# Patient Record
Sex: Female | Born: 1945
Health system: Southern US, Community
[De-identification: ages and names within clinical notes are randomized; demographics above are authoritative.]

## PROBLEM LIST (undated history)

## (undated) DIAGNOSIS — G2581 Restless legs syndrome: Secondary | ICD-10-CM

## (undated) DIAGNOSIS — G5600 Carpal tunnel syndrome, unspecified upper limb: Secondary | ICD-10-CM

## (undated) DIAGNOSIS — I4510 Unspecified right bundle-branch block: Secondary | ICD-10-CM

## (undated) DIAGNOSIS — F419 Anxiety disorder, unspecified: Secondary | ICD-10-CM

## (undated) DIAGNOSIS — E782 Mixed hyperlipidemia: Secondary | ICD-10-CM

## (undated) DIAGNOSIS — K219 Gastro-esophageal reflux disease without esophagitis: Secondary | ICD-10-CM

## (undated) DIAGNOSIS — I480 Paroxysmal atrial fibrillation: Secondary | ICD-10-CM

## (undated) DIAGNOSIS — Z9889 Other specified postprocedural states: Secondary | ICD-10-CM

## (undated) DIAGNOSIS — I4891 Unspecified atrial fibrillation: Secondary | ICD-10-CM

## (undated) DIAGNOSIS — R0600 Dyspnea, unspecified: Secondary | ICD-10-CM

## (undated) DIAGNOSIS — H269 Unspecified cataract: Secondary | ICD-10-CM

## (undated) DIAGNOSIS — J189 Pneumonia, unspecified organism: Secondary | ICD-10-CM

## (undated) DIAGNOSIS — F329 Major depressive disorder, single episode, unspecified: Secondary | ICD-10-CM

## (undated) DIAGNOSIS — E119 Type 2 diabetes mellitus without complications: Secondary | ICD-10-CM

## (undated) DIAGNOSIS — F32A Depression, unspecified: Secondary | ICD-10-CM

## (undated) DIAGNOSIS — I10 Essential (primary) hypertension: Secondary | ICD-10-CM

## (undated) DIAGNOSIS — E1140 Type 2 diabetes mellitus with diabetic neuropathy, unspecified: Secondary | ICD-10-CM

## (undated) DIAGNOSIS — T7840XA Allergy, unspecified, initial encounter: Secondary | ICD-10-CM

## (undated) DIAGNOSIS — R112 Nausea with vomiting, unspecified: Secondary | ICD-10-CM

## (undated) DIAGNOSIS — J309 Allergic rhinitis, unspecified: Secondary | ICD-10-CM

## (undated) DIAGNOSIS — C55 Malignant neoplasm of uterus, part unspecified: Secondary | ICD-10-CM

## (undated) HISTORY — DX: Gastro-esophageal reflux disease without esophagitis: K21.9

## (undated) HISTORY — DX: Paroxysmal atrial fibrillation: I48.0

## (undated) HISTORY — DX: Essential (primary) hypertension: I10

## (undated) HISTORY — PX: SPINE SURGERY: SHX786

## (undated) HISTORY — DX: Malignant neoplasm of uterus, part unspecified: C55

## (undated) HISTORY — DX: Major depressive disorder, single episode, unspecified: F32.9

## (undated) HISTORY — DX: Allergic rhinitis, unspecified: J30.9

## (undated) HISTORY — PX: ABDOMINAL HYSTERECTOMY: SHX81

## (undated) HISTORY — PX: CHOLECYSTECTOMY: SHX55

## (undated) HISTORY — DX: Allergy, unspecified, initial encounter: T78.40XA

## (undated) HISTORY — DX: Depression, unspecified: F32.A

## (undated) HISTORY — DX: Unspecified cataract: H26.9

## (undated) HISTORY — PX: EYE SURGERY: SHX253

## (undated) HISTORY — DX: Carpal tunnel syndrome, unspecified upper limb: G56.00

## (undated) HISTORY — DX: Restless legs syndrome: G25.81

## (undated) HISTORY — DX: Type 2 diabetes mellitus without complications: E11.9

## (undated) HISTORY — DX: Unspecified right bundle-branch block: I45.10

## (undated) HISTORY — DX: Mixed hyperlipidemia: E78.2

## (undated) HISTORY — PX: BACK SURGERY: SHX140

## (undated) HISTORY — DX: Anxiety disorder, unspecified: F41.9

## (undated) HISTORY — DX: Type 2 diabetes mellitus with diabetic neuropathy, unspecified: E11.40

---

## 1998-05-14 ENCOUNTER — Ambulatory Visit (HOSPITAL_COMMUNITY): Admission: RE | Admit: 1998-05-14 | Discharge: 1998-05-14 | Payer: Self-pay | Admitting: Neurosurgery

## 1998-05-14 ENCOUNTER — Encounter: Payer: Self-pay | Admitting: Neurosurgery

## 1998-05-27 ENCOUNTER — Encounter: Payer: Self-pay | Admitting: Neurosurgery

## 1998-05-27 ENCOUNTER — Ambulatory Visit (HOSPITAL_COMMUNITY): Admission: RE | Admit: 1998-05-27 | Discharge: 1998-05-27 | Payer: Self-pay | Admitting: Neurosurgery

## 1998-06-10 ENCOUNTER — Encounter: Payer: Self-pay | Admitting: Neurosurgery

## 1998-06-10 ENCOUNTER — Ambulatory Visit (HOSPITAL_COMMUNITY): Admission: RE | Admit: 1998-06-10 | Discharge: 1998-06-10 | Payer: Self-pay | Admitting: Neurosurgery

## 1998-06-26 ENCOUNTER — Encounter: Payer: Self-pay | Admitting: Neurosurgery

## 1998-06-26 ENCOUNTER — Ambulatory Visit (HOSPITAL_COMMUNITY): Admission: RE | Admit: 1998-06-26 | Discharge: 1998-06-26 | Payer: Self-pay | Admitting: Neurosurgery

## 1998-10-05 ENCOUNTER — Encounter: Payer: Self-pay | Admitting: Neurosurgery

## 1998-10-07 ENCOUNTER — Inpatient Hospital Stay (HOSPITAL_COMMUNITY): Admission: RE | Admit: 1998-10-07 | Discharge: 1998-10-07 | Payer: Self-pay | Admitting: Neurosurgery

## 1998-10-07 ENCOUNTER — Encounter: Payer: Self-pay | Admitting: Neurosurgery

## 1998-12-08 ENCOUNTER — Encounter: Payer: Self-pay | Admitting: Neurosurgery

## 1998-12-08 ENCOUNTER — Ambulatory Visit (HOSPITAL_COMMUNITY): Admission: RE | Admit: 1998-12-08 | Discharge: 1998-12-08 | Payer: Self-pay | Admitting: Neurosurgery

## 1999-01-07 ENCOUNTER — Encounter: Payer: Self-pay | Admitting: Neurosurgery

## 1999-01-07 ENCOUNTER — Ambulatory Visit (HOSPITAL_COMMUNITY): Admission: RE | Admit: 1999-01-07 | Discharge: 1999-01-07 | Payer: Self-pay | Admitting: Neurosurgery

## 2001-11-27 ENCOUNTER — Encounter: Payer: Self-pay | Admitting: Family Medicine

## 2001-11-27 ENCOUNTER — Ambulatory Visit (HOSPITAL_COMMUNITY): Admission: RE | Admit: 2001-11-27 | Discharge: 2001-11-27 | Payer: Self-pay | Admitting: Family Medicine

## 2003-09-16 ENCOUNTER — Emergency Department (HOSPITAL_COMMUNITY): Admission: EM | Admit: 2003-09-16 | Discharge: 2003-09-16 | Payer: Self-pay | Admitting: Emergency Medicine

## 2005-03-09 ENCOUNTER — Emergency Department (HOSPITAL_COMMUNITY): Admission: EM | Admit: 2005-03-09 | Discharge: 2005-03-09 | Payer: Self-pay | Admitting: Emergency Medicine

## 2008-09-12 ENCOUNTER — Other Ambulatory Visit: Admission: RE | Admit: 2008-09-12 | Discharge: 2008-09-12 | Payer: Self-pay | Admitting: Orthopaedic Surgery

## 2010-06-15 ENCOUNTER — Encounter (HOSPITAL_COMMUNITY): Payer: Medicare Other

## 2010-06-15 ENCOUNTER — Other Ambulatory Visit: Payer: Self-pay | Admitting: Anesthesiology

## 2010-06-15 LAB — BASIC METABOLIC PANEL
BUN: 12 mg/dL (ref 6–23)
CO2: 26 mEq/L (ref 19–32)
Calcium: 10.6 mg/dL — ABNORMAL HIGH (ref 8.4–10.5)
Chloride: 101 mEq/L (ref 96–112)
Creatinine, Ser: 0.62 mg/dL (ref 0.4–1.2)
GFR calc Af Amer: >60 mL/min (ref >60)
GFR calc non Af Amer: >60 mL/min (ref >60)
Glucose, Bld: 135 mg/dL — ABNORMAL HIGH (ref 70–99)
Potassium: 4.1 mEq/L (ref 3.5–5.1)
Sodium: 140 mEq/L (ref 135–145)

## 2010-06-15 LAB — HEMOGLOBIN AND HEMATOCRIT, BLOOD
HCT: 38.8 % (ref 36.0–46.0)
Hemoglobin: 13.1 g/dL (ref 12.0–15.0)

## 2010-06-22 ENCOUNTER — Ambulatory Visit (HOSPITAL_COMMUNITY)
Admission: RE | Admit: 2010-06-22 | Discharge: 2010-06-22 | Disposition: A | Discharge status: Home / Self Care | Payer: Medicare Other | Source: Ambulatory Visit | Attending: Ophthalmology | Admitting: Ophthalmology

## 2010-06-22 DIAGNOSIS — I10 Essential (primary) hypertension: Secondary | ICD-10-CM | POA: Insufficient documentation

## 2010-06-22 DIAGNOSIS — Z794 Long term (current) use of insulin: Secondary | ICD-10-CM | POA: Insufficient documentation

## 2010-06-22 DIAGNOSIS — H2510 Age-related nuclear cataract, unspecified eye: Secondary | ICD-10-CM | POA: Insufficient documentation

## 2010-06-22 DIAGNOSIS — Z01812 Encounter for preprocedural laboratory examination: Secondary | ICD-10-CM | POA: Insufficient documentation

## 2010-06-22 DIAGNOSIS — Z79899 Other long term (current) drug therapy: Secondary | ICD-10-CM | POA: Insufficient documentation

## 2010-06-22 DIAGNOSIS — E119 Type 2 diabetes mellitus without complications: Secondary | ICD-10-CM | POA: Insufficient documentation

## 2010-06-22 LAB — GLUCOSE, CAPILLARY: Glucose-Capillary: 181 mg/dL — ABNORMAL HIGH (ref 70–99)

## 2010-07-02 ENCOUNTER — Other Ambulatory Visit (HOSPITAL_COMMUNITY): Payer: Medicare Other

## 2010-07-06 ENCOUNTER — Ambulatory Visit (HOSPITAL_COMMUNITY)
Admission: RE | Admit: 2010-07-06 | Discharge: 2010-07-06 | Disposition: A | Discharge status: Home / Self Care | Payer: Medicare Other | Source: Ambulatory Visit | Attending: Ophthalmology | Admitting: Ophthalmology

## 2010-07-06 DIAGNOSIS — Z01812 Encounter for preprocedural laboratory examination: Secondary | ICD-10-CM | POA: Insufficient documentation

## 2010-07-06 DIAGNOSIS — H2510 Age-related nuclear cataract, unspecified eye: Secondary | ICD-10-CM | POA: Insufficient documentation

## 2010-07-06 DIAGNOSIS — E119 Type 2 diabetes mellitus without complications: Secondary | ICD-10-CM | POA: Insufficient documentation

## 2010-07-06 DIAGNOSIS — Z794 Long term (current) use of insulin: Secondary | ICD-10-CM | POA: Insufficient documentation

## 2010-07-06 DIAGNOSIS — I10 Essential (primary) hypertension: Secondary | ICD-10-CM | POA: Insufficient documentation

## 2010-07-06 DIAGNOSIS — Z7982 Long term (current) use of aspirin: Secondary | ICD-10-CM | POA: Insufficient documentation

## 2011-06-22 ENCOUNTER — Telehealth: Payer: Self-pay

## 2011-06-22 NOTE — Telephone Encounter (Signed)
LMOM to call.

## 2011-07-12 ENCOUNTER — Telehealth: Payer: Self-pay

## 2011-07-12 NOTE — Telephone Encounter (Signed)
LMOM to call. See new phone note of 07/12/2011.

## 2011-07-12 NOTE — Telephone Encounter (Signed)
LMOM for a return call.  

## 2011-07-21 NOTE — Telephone Encounter (Signed)
LMOM to call. Will mail letter also.

## 2011-07-21 NOTE — Telephone Encounter (Signed)
Pt returned call. Said she does not want to have colonoscopy done at this time. Said she has a lot going on at this time and she will call when she is ready.

## 2014-09-16 LAB — HEMOGLOBIN A1C: Hgb A1c MFr Bld: 7 % — AB (ref 4.0–6.0)

## 2014-09-22 ENCOUNTER — Other Ambulatory Visit (HOSPITAL_COMMUNITY): Payer: Self-pay | Admitting: Family Medicine

## 2014-09-22 ENCOUNTER — Ambulatory Visit (HOSPITAL_COMMUNITY)
Admission: RE | Admit: 2014-09-22 | Discharge: 2014-09-22 | Disposition: A | Discharge status: Home / Self Care | Payer: Medicare Other | Source: Ambulatory Visit | Attending: Family Medicine | Admitting: Family Medicine

## 2014-09-22 DIAGNOSIS — X58XXXA Exposure to other specified factors, initial encounter: Secondary | ICD-10-CM | POA: Diagnosis not present

## 2014-09-22 DIAGNOSIS — M79671 Pain in right foot: Secondary | ICD-10-CM | POA: Diagnosis present

## 2014-09-22 DIAGNOSIS — R52 Pain, unspecified: Secondary | ICD-10-CM

## 2014-09-22 DIAGNOSIS — R937 Abnormal findings on diagnostic imaging of other parts of musculoskeletal system: Secondary | ICD-10-CM | POA: Diagnosis not present

## 2014-09-22 DIAGNOSIS — S99821A Other specified injuries of right foot, initial encounter: Secondary | ICD-10-CM | POA: Insufficient documentation

## 2014-10-28 ENCOUNTER — Telehealth: Payer: Self-pay

## 2014-10-28 NOTE — Telephone Encounter (Signed)
Pt states that her invokana is too expensive and wants to know if we can send in a new medication.

## 2014-10-31 NOTE — Telephone Encounter (Signed)
She can stay off of Invokana until next visit. No cheap alternative

## 2014-11-03 NOTE — Telephone Encounter (Signed)
Pt notified and agrees. 

## 2014-11-21 LAB — HEMOGLOBIN A1C: Hgb A1c MFr Bld: 6.5 % — AB (ref 4.0–6.0)

## 2014-12-24 ENCOUNTER — Ambulatory Visit (INDEPENDENT_AMBULATORY_CARE_PROVIDER_SITE_OTHER): Payer: Medicare Other | Admitting: "Endocrinology

## 2014-12-24 ENCOUNTER — Encounter: Payer: Self-pay | Admitting: "Endocrinology

## 2014-12-24 VITALS — BP 130/73 | HR 93 | Ht 63.0 in | Wt 172.0 lb

## 2014-12-24 DIAGNOSIS — E785 Hyperlipidemia, unspecified: Secondary | ICD-10-CM

## 2014-12-24 DIAGNOSIS — Z794 Long term (current) use of insulin: Secondary | ICD-10-CM

## 2014-12-24 DIAGNOSIS — IMO0001 Reserved for inherently not codable concepts without codable children: Secondary | ICD-10-CM

## 2014-12-24 DIAGNOSIS — E782 Mixed hyperlipidemia: Secondary | ICD-10-CM | POA: Insufficient documentation

## 2014-12-24 DIAGNOSIS — E1165 Type 2 diabetes mellitus with hyperglycemia: Secondary | ICD-10-CM | POA: Diagnosis not present

## 2014-12-24 DIAGNOSIS — I10 Essential (primary) hypertension: Secondary | ICD-10-CM | POA: Insufficient documentation

## 2014-12-24 NOTE — Patient Instructions (Signed)

## 2014-12-24 NOTE — Progress Notes (Signed)
Subjective:    Patient ID: Ellen Jordan, female    DOB: 02-15-1945, PCP Robert Bellow, MD   No past medical history on file. No past surgical history on file. Social History   Social History  . Marital Status: Married    Spouse Name: N/A  . Number of Children: N/A  . Years of Education: N/A   Social History Main Topics  . Smoking status: Former Research scientist (life sciences)  . Smokeless tobacco: Not on file  . Alcohol Use: No  . Drug Use: No  . Sexual Activity: Not on file   Other Topics Concern  . Not on file   Social History Narrative  . No narrative on file   Outpatient Encounter Prescriptions as of 12/24/2014  Medication Sig  . ALPRAZolam (XANAX) 0.5 MG tablet Take 0.5 mg by mouth at bedtime as needed for anxiety.  Marland Kitchen b complex vitamins tablet Take 1 tablet by mouth daily.  . Cinnamon 500 MG TABS Take by mouth daily.  . insulin aspart (NOVOLOG) 100 UNIT/ML injection Inject 15-21 Units into the skin 3 (three) times daily before meals.  . insulin detemir (LEVEMIR) 100 UNIT/ML injection Inject 60 Units into the skin at bedtime.  . Liraglutide (VICTOZA) 18 MG/3ML SOPN Inject 1.8 mg into the skin daily.  Marland Kitchen lisinopril (PRINIVIL,ZESTRIL) 10 MG tablet Take 10 mg by mouth daily.  . Omega-3 Fatty Acids (FISH OIL) 1000 MG CAPS Take by mouth daily.  . simvastatin (ZOCOR) 20 MG tablet Take 20 mg by mouth daily.  . Vitamin D, Cholecalciferol, 400 UNITS CHEW Chew by mouth daily.  . vitamin E (VITAMIN E) 400 UNIT capsule Take 400 Units by mouth daily.   No facility-administered encounter medications on file as of 12/24/2014.   ALLERGIES: Allergies  Allergen Reactions  . Penicillins    VACCINATION STATUS:  There is no immunization history on file for this patient.  Diabetes She presents for her follow-up diabetic visit. She has type 2 diabetes mellitus. Onset time: She was diagnosed at approximate age of 35 years. Her disease course has been improving. There are no hypoglycemic  associated symptoms. Pertinent negatives for hypoglycemia include no confusion, headaches, pallor or seizures. There are no diabetic associated symptoms. Pertinent negatives for diabetes include no chest pain, no polydipsia, no polyphagia and no polyuria. There are no hypoglycemic complications. Symptoms are improving. There are no diabetic complications. Risk factors for coronary artery disease include diabetes mellitus, dyslipidemia, hypertension, obesity, sedentary lifestyle and tobacco exposure. Current diabetic treatment includes intensive insulin program. She is compliant with treatment most of the time. Her weight is stable. She is following a generally unhealthy diet. She has not had a previous visit with a dietitian (She declined referral to CDE.). Her home blood glucose trend is decreasing steadily. Her breakfast blood glucose range is generally 140-180 mg/dl. Her lunch blood glucose range is generally 140-180 mg/dl. Her dinner blood glucose range is generally 140-180 mg/dl. Her overall blood glucose range is 140-180 mg/dl. An ACE inhibitor/angiotensin II receptor blocker is being taken. She does not see a podiatrist.Eye exam is not current (She is overdue for eye exam, I advised her to schedule a visit).  Hyperlipidemia This is a chronic problem. The current episode started more than 1 year ago. Exacerbating diseases include diabetes and obesity. Pertinent negatives include no chest pain, leg pain, myalgias or shortness of breath. Current antihyperlipidemic treatment includes statins. Risk factors for coronary artery disease include dyslipidemia, diabetes mellitus, hypertension, a sedentary lifestyle and obesity.  Hypertension This is a chronic problem. The current episode started more than 1 year ago. The problem is controlled. Pertinent negatives include no chest pain, headaches, palpitations or shortness of breath. Risk factors for coronary artery disease include diabetes mellitus, dyslipidemia,  obesity, sedentary lifestyle and smoking/tobacco exposure. Past treatments include ACE inhibitors. The current treatment provides moderate improvement.     Review of Systems  Constitutional: Negative for unexpected weight change.  HENT: Negative for trouble swallowing and voice change.   Eyes: Negative for visual disturbance.  Respiratory: Negative for cough, shortness of breath and wheezing.   Cardiovascular: Negative for chest pain, palpitations and leg swelling.  Gastrointestinal: Negative for nausea, vomiting and diarrhea.  Endocrine: Negative for cold intolerance, heat intolerance, polydipsia, polyphagia and polyuria.  Musculoskeletal: Negative for myalgias and arthralgias.  Skin: Negative for color change, pallor, rash and wound.  Neurological: Negative for seizures and headaches.  Psychiatric/Behavioral: Negative for suicidal ideas and confusion.    Objective:    BP 130/73 mmHg  Pulse 93  Ht _0  (1.6 m)  Wt 172 lb (78.019 kg)  BMI 30.48 kg/m2  SpO2 95%  Wt Readings from Last 3 Encounters:  12/24/14 172 lb (78.019 kg)    Physical Exam  Constitutional: She is oriented to person, place, and time. She appears well-developed.  HENT:  Head: Normocephalic and atraumatic.  Eyes: EOM are normal.  Neck: Normal range of motion. Neck supple. No tracheal deviation present. No thyromegaly present.  Cardiovascular: Normal rate and regular rhythm.   Pulmonary/Chest: Effort normal and breath sounds normal.  Abdominal: Soft. Bowel sounds are normal. There is no tenderness. There is no guarding.  Musculoskeletal: Normal range of motion. She exhibits no edema.  Neurological: She is alert and oriented to person, place, and time. She has normal reflexes. No cranial nerve deficit. Coordination normal.  Skin: Skin is warm and dry. No rash noted. No erythema. No pallor.  Psychiatric: She has a normal mood and affect. Judgment normal.    Results for orders placed or performed in visit on  12/24/14  Hemoglobin A1c  Result Value Ref Range   Hgb A1c MFr Bld 6.5 (A) 4.0 - 6.0 %  Hemoglobin A1c  Result Value Ref Range   Hgb A1c MFr Bld 7.0 (A) 4.0 - 6.0 %   Complete Blood Count (Most recent): Lab Results  Component Value Date   HGB 13.1 06/15/2010   HCT 38.8 06/15/2010   Chemistry (most recent): Lab Results  Component Value Date   NA 140 06/15/2010   K 4.1 06/15/2010   CL 101 06/15/2010   CO2 26 06/15/2010   BUN 12 06/15/2010   CREATININE 0.62 06/15/2010    Assessment & Plan:   1. Uncontrolled type 2 diabetes mellitus without complication, with long-term current use of insulin   - She remains at a high risk for more acute and chronic complications of diabetes which include CAD, CVA, CKD, retinopathy, and neuropathy. These are all discussed in detail with the patient.  Patient came with consistently near target glucose profile, and  recent A1c of 6.5% overall improving from 14.6 %.  Glucose logs and insulin administration records pertaining to this visit,  to be scanned into patient's records.  Recent labs reviewed.   - I have re-counseled the patient on diet management  by adopting a carbohydrate restricted / protein rich  Diet.  - Suggestion is made for patient to avoid simple carbohydrates   from their diet including Cakes , Desserts, Ice Cream,  Soda (  diet and regular) , Sweet Tea , Candies,  Chips, Cookies, Artificial Sweeteners,   and "Sugar-free" Products .  This will help patient to have stable blood glucose profile and potentially avoid unintended  Weight gain.  - Patient is advised to stick to a routine mealtimes to eat 3 meals  a day and avoid unnecessary snacks ( to snack only to correct hypoglycemia).  - The patient  declined a referral to a  CDE for individualized DM education.  - I have approached patient with the following individualized plan to manage diabetes and patient agrees.  - Continue Levemir 60 units qhs, and Novolog 15 units TIDAC ,  plus correction dose associated with monitoring of blood glucose before meals and at bedtime.  -Continue Victoza at 1.8 mg sq q day.  -I will discontinue  Invokana.  - Patient specific target  for A1c; LDL, HDL, Triglycerides, and  Waist Circumference were discussed in detail.  2) BP/HTN: controlled . Continue current medications including ACEI/ARB. 3) Lipids/HPL:  continue statins. 4)  Weight/Diet:  declined CDE consult, exercise, and carbohydrates information provided.  5) Chronic Care/Health Maintenance:  -Patient is on ACEI/ARB and Statin medications and encouraged to continue to follow up with Ophthalmology, Podiatrist at least yearly or according to recommendations, and advised to  stay away from smoking. I have recommended yearly flu vaccine and pneumonia vaccination at least every 5 years; moderate intensity exercise for up to 150 minutes weekly; and  sleep for at least 7 hours a day.  - 25 minutes of time was spent on the care of this patient , 50% of which was applied for counseling on diabetes complications and their preventions.  - I advised patient to maintain close follow up with Robert Bellow, MD for primary care needs.  Patient is asked to bring meter and  blood glucose logs during their next visit.   Follow up plan: -Return in about 3 months (around 03/24/2015) for diabetes, high blood pressure, high cholesterol, follow up with pre-visit labs, meter, and logs.  Glade Lloyd, MD Phone: [phone]  Fax: [phone]   12/24/2014, 11:41 AM

## 2015-02-16 ENCOUNTER — Other Ambulatory Visit: Payer: Self-pay

## 2015-02-18 ENCOUNTER — Other Ambulatory Visit: Payer: Self-pay

## 2015-02-28 ENCOUNTER — Encounter (HOSPITAL_COMMUNITY): Payer: Self-pay | Admitting: Emergency Medicine

## 2015-02-28 ENCOUNTER — Emergency Department (HOSPITAL_COMMUNITY): Payer: Commercial Managed Care - HMO

## 2015-02-28 ENCOUNTER — Inpatient Hospital Stay (HOSPITAL_COMMUNITY)
Admission: EM | Admit: 2015-02-28 | Discharge: 2015-03-12 | DRG: 871 | Disposition: A | Discharge status: Home / Self Care | Payer: Commercial Managed Care - HMO | Attending: Internal Medicine | Admitting: Internal Medicine

## 2015-02-28 DIAGNOSIS — R112 Nausea with vomiting, unspecified: Secondary | ICD-10-CM | POA: Diagnosis present

## 2015-02-28 DIAGNOSIS — K761 Chronic passive congestion of liver: Secondary | ICD-10-CM | POA: Diagnosis not present

## 2015-02-28 DIAGNOSIS — R0602 Shortness of breath: Secondary | ICD-10-CM | POA: Diagnosis not present

## 2015-02-28 DIAGNOSIS — I4891 Unspecified atrial fibrillation: Secondary | ICD-10-CM | POA: Diagnosis present

## 2015-02-28 DIAGNOSIS — I10 Essential (primary) hypertension: Secondary | ICD-10-CM | POA: Diagnosis not present

## 2015-02-28 DIAGNOSIS — E785 Hyperlipidemia, unspecified: Secondary | ICD-10-CM | POA: Diagnosis present

## 2015-02-28 DIAGNOSIS — E876 Hypokalemia: Secondary | ICD-10-CM | POA: Diagnosis not present

## 2015-02-28 DIAGNOSIS — J1100 Influenza due to unidentified influenza virus with unspecified type of pneumonia: Secondary | ICD-10-CM | POA: Diagnosis not present

## 2015-02-28 DIAGNOSIS — I361 Nonrheumatic tricuspid (valve) insufficiency: Secondary | ICD-10-CM | POA: Diagnosis not present

## 2015-02-28 DIAGNOSIS — I340 Nonrheumatic mitral (valve) insufficiency: Secondary | ICD-10-CM | POA: Diagnosis not present

## 2015-02-28 DIAGNOSIS — J9690 Respiratory failure, unspecified, unspecified whether with hypoxia or hypercapnia: Secondary | ICD-10-CM | POA: Diagnosis not present

## 2015-02-28 DIAGNOSIS — R269 Unspecified abnormalities of gait and mobility: Secondary | ICD-10-CM | POA: Diagnosis not present

## 2015-02-28 DIAGNOSIS — R6520 Severe sepsis without septic shock: Secondary | ICD-10-CM | POA: Diagnosis not present

## 2015-02-28 DIAGNOSIS — I481 Persistent atrial fibrillation: Secondary | ICD-10-CM | POA: Diagnosis present

## 2015-02-28 DIAGNOSIS — A419 Sepsis, unspecified organism: Principal | ICD-10-CM | POA: Diagnosis present

## 2015-02-28 DIAGNOSIS — E1165 Type 2 diabetes mellitus with hyperglycemia: Secondary | ICD-10-CM | POA: Diagnosis present

## 2015-02-28 DIAGNOSIS — J440 Chronic obstructive pulmonary disease with acute lower respiratory infection: Secondary | ICD-10-CM | POA: Diagnosis not present

## 2015-02-28 DIAGNOSIS — R0600 Dyspnea, unspecified: Secondary | ICD-10-CM | POA: Diagnosis not present

## 2015-02-28 DIAGNOSIS — J9601 Acute respiratory failure with hypoxia: Secondary | ICD-10-CM | POA: Diagnosis not present

## 2015-02-28 DIAGNOSIS — I351 Nonrheumatic aortic (valve) insufficiency: Secondary | ICD-10-CM | POA: Diagnosis not present

## 2015-02-28 DIAGNOSIS — I248 Other forms of acute ischemic heart disease: Secondary | ICD-10-CM | POA: Diagnosis present

## 2015-02-28 DIAGNOSIS — I5033 Acute on chronic diastolic (congestive) heart failure: Secondary | ICD-10-CM | POA: Diagnosis present

## 2015-02-28 DIAGNOSIS — R7989 Other specified abnormal findings of blood chemistry: Secondary | ICD-10-CM | POA: Diagnosis not present

## 2015-02-28 DIAGNOSIS — I110 Hypertensive heart disease with heart failure: Secondary | ICD-10-CM | POA: Diagnosis not present

## 2015-02-28 DIAGNOSIS — F419 Anxiety disorder, unspecified: Secondary | ICD-10-CM | POA: Diagnosis not present

## 2015-02-28 DIAGNOSIS — Z794 Long term (current) use of insulin: Secondary | ICD-10-CM | POA: Diagnosis not present

## 2015-02-28 DIAGNOSIS — F17200 Nicotine dependence, unspecified, uncomplicated: Secondary | ICD-10-CM | POA: Diagnosis not present

## 2015-02-28 DIAGNOSIS — J811 Chronic pulmonary edema: Secondary | ICD-10-CM

## 2015-02-28 DIAGNOSIS — E11649 Type 2 diabetes mellitus with hypoglycemia without coma: Secondary | ICD-10-CM | POA: Diagnosis present

## 2015-02-28 DIAGNOSIS — I5031 Acute diastolic (congestive) heart failure: Secondary | ICD-10-CM | POA: Diagnosis not present

## 2015-02-28 DIAGNOSIS — J189 Pneumonia, unspecified organism: Secondary | ICD-10-CM | POA: Diagnosis present

## 2015-02-28 DIAGNOSIS — E860 Dehydration: Secondary | ICD-10-CM | POA: Diagnosis present

## 2015-02-28 DIAGNOSIS — J1000 Influenza due to other identified influenza virus with unspecified type of pneumonia: Secondary | ICD-10-CM | POA: Diagnosis present

## 2015-02-28 DIAGNOSIS — E872 Acidosis: Secondary | ICD-10-CM | POA: Diagnosis not present

## 2015-02-28 DIAGNOSIS — J80 Acute respiratory distress syndrome: Secondary | ICD-10-CM | POA: Diagnosis not present

## 2015-02-28 DIAGNOSIS — Z87891 Personal history of nicotine dependence: Secondary | ICD-10-CM | POA: Diagnosis not present

## 2015-02-28 DIAGNOSIS — R05 Cough: Secondary | ICD-10-CM | POA: Diagnosis not present

## 2015-02-28 DIAGNOSIS — IMO0001 Reserved for inherently not codable concepts without codable children: Secondary | ICD-10-CM

## 2015-02-28 LAB — COMPREHENSIVE METABOLIC PANEL
ALT: 32 U/L (ref 14–54)
ANION GAP: 14 (ref 5–15)
AST: 39 U/L (ref 15–41)
Albumin: 3.7 g/dL (ref 3.5–5.0)
Alkaline Phosphatase: 56 U/L (ref 38–126)
BUN: 21 mg/dL — ABNORMAL HIGH (ref 6–20)
CHLORIDE: 97 mmol/L — AB (ref 101–111)
CO2: 19 mmol/L — AB (ref 22–32)
CREATININE: 0.93 mg/dL (ref 0.44–1.00)
Calcium: 8.9 mg/dL (ref 8.9–10.3)
GFR calc non Af Amer: >60 mL/min (ref >60)
Glucose, Bld: 200 mg/dL — ABNORMAL HIGH (ref 65–99)
POTASSIUM: 3.5 mmol/L (ref 3.5–5.1)
SODIUM: 130 mmol/L — AB (ref 135–145)
Total Bilirubin: 1.3 mg/dL — ABNORMAL HIGH (ref 0.3–1.2)
Total Protein: 7.7 g/dL (ref 6.5–8.1)

## 2015-02-28 LAB — INFLUENZA PANEL BY PCR (TYPE A & B)
H1N1 flu by pcr: NOT DETECTED
INFLAPCR: NEGATIVE
Influenza B By PCR: POSITIVE — AB

## 2015-02-28 LAB — I-STAT CG4 LACTIC ACID, ED
LACTIC ACID, VENOUS: 2.7 mmol/L — AB (ref 0.5–2.0)
Lactic Acid, Venous: 2.93 mmol/L (ref 0.5–2.0)

## 2015-02-28 LAB — I-STAT CHEM 8, ED
BUN: 20 mg/dL (ref 6–20)
CHLORIDE: 98 mmol/L — AB (ref 101–111)
Calcium, Ion: 1.07 mmol/L — ABNORMAL LOW (ref 1.13–1.30)
Creatinine, Ser: 0.9 mg/dL (ref 0.44–1.00)
Glucose, Bld: 198 mg/dL — ABNORMAL HIGH (ref 65–99)
HEMATOCRIT: 41 % (ref 36.0–46.0)
Hemoglobin: 13.9 g/dL (ref 12.0–15.0)
POTASSIUM: 3.6 mmol/L (ref 3.5–5.1)
SODIUM: 132 mmol/L — AB (ref 135–145)
TCO2: 20 mmol/L (ref 0–100)

## 2015-02-28 LAB — CBC
HCT: 38.2 % (ref 36.0–46.0)
Hemoglobin: 13.1 g/dL (ref 12.0–15.0)
MCH: 29.2 pg (ref 26.0–34.0)
MCHC: 34.3 g/dL (ref 30.0–36.0)
MCV: 85.3 fL (ref 78.0–100.0)
PLATELETS: 153 10*3/uL (ref 150–400)
RBC: 4.48 MIL/uL (ref 3.87–5.11)
RDW: 13.7 % (ref 11.5–15.5)
WBC: 12.7 10*3/uL — ABNORMAL HIGH (ref 4.0–10.5)

## 2015-02-28 LAB — TROPONIN I: TROPONIN I: 0.04 ng/mL — AB (ref <0.031)

## 2015-02-28 LAB — BRAIN NATRIURETIC PEPTIDE: B Natriuretic Peptide: 375 pg/mL — ABNORMAL HIGH (ref 0.0–100.0)

## 2015-02-28 MED ORDER — ACETAMINOPHEN 500 MG PO TABS
1000.0000 mg | ORAL_TABLET | Freq: Once | ORAL | Status: AC
  Administered 2015-02-28: 1000 mg via ORAL
  Filled 2015-02-28: qty 2

## 2015-02-28 MED ORDER — LEVOFLOXACIN IN D5W 750 MG/150ML IV SOLN
750.0000 mg | Freq: Once | INTRAVENOUS | Status: DC
  Administered 2015-02-28: 750 mg via INTRAVENOUS

## 2015-02-28 MED ORDER — GUAIFENESIN-DM 100-10 MG/5ML PO SYRP
5.0000 mL | ORAL_SOLUTION | ORAL | Status: DC | PRN
  Administered 2015-02-28 – 2015-03-03 (×6): 5 mL via ORAL
  Filled 2015-02-28 (×6): qty 5

## 2015-02-28 MED ORDER — SODIUM CHLORIDE 0.9 % IV BOLUS (SEPSIS)
1000.0000 mL | Freq: Once | INTRAVENOUS | Status: AC
  Administered 2015-02-28: 1000 mL via INTRAVENOUS

## 2015-02-28 MED ORDER — OSELTAMIVIR PHOSPHATE 30 MG PO CAPS
30.0000 mg | ORAL_CAPSULE | Freq: Two times a day (BID) | ORAL | Status: AC
  Administered 2015-03-01 – 2015-03-05 (×10): 30 mg via ORAL
  Filled 2015-02-28 (×11): qty 1

## 2015-02-28 MED ORDER — DILTIAZEM HCL 25 MG/5ML IV SOLN
20.0000 mg | Freq: Once | INTRAVENOUS | Status: AC
  Administered 2015-02-28: 20 mg via INTRAVENOUS

## 2015-02-28 MED ORDER — IPRATROPIUM BROMIDE 0.02 % IN SOLN
0.5000 mg | Freq: Once | RESPIRATORY_TRACT | Status: AC
  Administered 2015-02-28: 0.5 mg via RESPIRATORY_TRACT
  Filled 2015-02-28: qty 2.5

## 2015-02-28 MED ORDER — VANCOMYCIN HCL IN DEXTROSE 1-5 GM/200ML-% IV SOLN: 1000.0000 mg | Freq: Once | INTRAVENOUS | Status: DC

## 2015-02-28 MED ORDER — DEXTROSE 5 % IV SOLN: 5.0000 mg/h | Freq: Once | INTRAVENOUS | Status: DC

## 2015-02-28 MED ORDER — SODIUM CHLORIDE 0.9 % IV BOLUS (SEPSIS)
500.0000 mL | Freq: Once | INTRAVENOUS | Status: AC
  Administered 2015-02-28: 500 mL via INTRAVENOUS

## 2015-02-28 MED ORDER — LEVOFLOXACIN IN D5W 750 MG/150ML IV SOLN
750.0000 mg | INTRAVENOUS | Status: DC
  Filled 2015-02-28: qty 150

## 2015-02-28 MED ORDER — DILTIAZEM LOAD VIA INFUSION
20.0000 mg | Freq: Once | INTRAVENOUS | Status: AC | PRN
  Administered 2015-02-28: 20 mg via INTRAVENOUS
  Filled 2015-02-28: qty 20

## 2015-02-28 MED ORDER — DEXTROSE 5 % IV SOLN
2.0000 g | Freq: Once | INTRAVENOUS | Status: AC
  Administered 2015-02-28: 2 g via INTRAVENOUS
  Filled 2015-02-28: qty 2

## 2015-02-28 MED ORDER — VANCOMYCIN HCL IN DEXTROSE 750-5 MG/150ML-% IV SOLN
750.0000 mg | Freq: Two times a day (BID) | INTRAVENOUS | Status: DC
  Filled 2015-02-28 (×4): qty 150

## 2015-02-28 MED ORDER — SODIUM CHLORIDE 0.9 % IV SOLN
INTRAVENOUS | Status: DC
  Administered 2015-02-28: via INTRAVENOUS

## 2015-02-28 MED ORDER — SODIUM CHLORIDE 0.9 % IV BOLUS (SEPSIS)
500.0000 mL | Freq: Once | INTRAVENOUS | Status: AC
Start: 2015-02-28 — End: 2015-02-28
  Administered 2015-02-28: 500 mL via INTRAVENOUS

## 2015-02-28 MED ORDER — DILTIAZEM LOAD VIA INFUSION
10.0000 mg | Freq: Once | INTRAVENOUS | Status: AC
  Administered 2015-02-28: 10 mg via INTRAVENOUS
  Filled 2015-02-28: qty 10

## 2015-02-28 MED ORDER — SODIUM CHLORIDE 0.9 % IV SOLN
INTRAVENOUS | Status: DC
  Administered 2015-02-28: 18:00:00 via INTRAVENOUS

## 2015-02-28 MED ORDER — ALBUTEROL SULFATE (2.5 MG/3ML) 0.083% IN NEBU
5.0000 mg | INHALATION_SOLUTION | Freq: Once | RESPIRATORY_TRACT | Status: AC
  Administered 2015-02-28: 5 mg via RESPIRATORY_TRACT
  Filled 2015-02-28: qty 6

## 2015-02-28 MED ORDER — VANCOMYCIN HCL 10 G IV SOLR
1500.0000 mg | Freq: Once | INTRAVENOUS | Status: AC
  Administered 2015-02-28: 1500 mg via INTRAVENOUS
  Filled 2015-02-28: qty 1500

## 2015-02-28 MED ORDER — DILTIAZEM HCL 100 MG IV SOLR
5.0000 mg/h | INTRAVENOUS | Status: DC
  Administered 2015-02-28: 12.5 mg/h via INTRAVENOUS
  Administered 2015-02-28: 5 mg/h via INTRAVENOUS
  Administered 2015-03-01: 15 mg/h via INTRAVENOUS
  Filled 2015-02-28 (×3): qty 100

## 2015-02-28 MED ORDER — DEXTROSE 5 % IV SOLN
1.0000 g | Freq: Three times a day (TID) | INTRAVENOUS | Status: DC
  Administered 2015-03-01 (×2): 1 g via INTRAVENOUS
  Filled 2015-02-28 (×7): qty 1

## 2015-02-28 NOTE — ED Notes (Addendum)
Patient c/o abd pain with  nausea, vomiting, and diarrhea. Unsure of any fevers. Denies any urinary syptoms. Per patient believes she may have pneumonia as well. Patient c/o cough and body aches x1 week with lack of appetite. Per daughter patient has not had medications in 3 days. Patient heart rate 140s-150s

## 2015-02-28 NOTE — Progress Notes (Signed)
ANTIBIOTIC CONSULT NOTE - INITIAL  Pharmacy Consult for Vancomycin/Levaqin/Azactam Indication: sepsis  Allergies  Allergen Reactions  . Penicillins     Patient Measurements: Height: _0  (160 cm) Weight: 177 lb (80.287 kg) IBW/kg (Calculated) : 52.4 Adjusted Body Weight:   Vital Signs: Temp: 100.7 F (38.2 C) (02/18 1408) Temp Source: Oral (02/18 1408) BP: 125/66 mmHg (02/18 1347) Pulse Rate: 145 (02/18 1347) Intake/Output from previous day:   Intake/Output from this shift:    Labs:  Recent Labs  02/28/15 1418 02/28/15 1421  WBC 12.7*  --   HGB 13.1 13.9  PLT 153  --   CREATININE  --  0.90   Estimated Creatinine Clearance: 57.6 mL/min (by C-G formula based on Cr of 0.9). No results for input(s): VANCOTROUGH, VANCOPEAK, VANCORANDOM, GENTTROUGH, GENTPEAK, GENTRANDOM, TOBRATROUGH, TOBRAPEAK, TOBRARND, AMIKACINPEAK, AMIKACINTROU, AMIKACIN in the last 72 hours.   Microbiology: No results found for this or any previous visit (from the past 720 hour(s)).  Medical History: Past Medical History  Diagnosis Date  . Hypertension   . Diabetes mellitus without complication ([place])   . Hypercholesteremia   . Cancer (HCC)     Medications:  Scheduled:  . diltiazem  20 mg Intravenous Once  . [START ON 03/01/2015] vancomycin  750 mg Intravenous Q12H   Assessment: 70 yo female ED patient, possibly pneumonia. Nausea, vomiting. CrCl 57.6 ml/min  Goal of Therapy:  Vancomycin trough level 15-20 mcg/ml  Plan:  Vancomycin 1500 mg IV  loading dose, then 750 mg IV every 12 hours Levaquin 750 mg IV every 24 hours Azactam 2 GM IV loading dose, then 1 GM IV every 8 hours Vancomycin trough at steady state Labs per protocol  Chriss Czar 02/28/2015,3:04 PM

## 2015-02-28 NOTE — Progress Notes (Signed)
Interim Progress Note  Called to assess patient accepted in transfer from [facility].  70 yo F w/ DM and HTN presenting with influenza B pneumonia and sepsis.  On broad spectrum antibiotics.  Cultures obtained, and 30 cc/kg bolus administered.  Vitals     Blood pressure 114/84, pulse 92, temperature 99.1 F (37.3 C), temperature source Oral, resp. rate 27, height _0  (1.6 m), weight 77.111 kg (170 lb), SpO2 97 %. Heart:     Irregular rate and rhythm and Tachycardic Lungs:    Bilateral diminished inspiratory effort, coarse airway sounds with exhalation.   Capillary Refill:   <2 sec Peripheral Pulse:   Radial pulse palpable Skin:     Pale  Started oseltamivir.  Continue MIVF and appropriate cares for sepsis.  On diltiazem for Afib, likely will not improve until sepsis adequately treated.

## 2015-02-28 NOTE — ED Notes (Signed)
Lab called with positive influenza B swab, RN contacted pt's primary RN at [facility] and reported the results.

## 2015-02-28 NOTE — H&P (Addendum)
History and Physical  Ellen Jordan LPF:[id number] DOB: 1945-06-29 DOA: 02/28/2015  Referring physician: Dr Ashok Cordia, ED physician PCP: Robert Bellow, MD   Chief Complaint: Shortness of breath, Cough  HPI: Ellen Jordan is a 70 y.o. female  With a history of diabetes, hypertension, hypercholesterolemia. She breath sounds with 3 weeks of worsening dyspnea is worse with exertion and improves with rest. She has a productive cough over the past 3-4 days with fevers. Additionally she has noticed that her heart rate has been fast over the past 2 days. She denies chest pain, although has some pleurisy with coughing.   Review of Systems:   Pt complains of dementia appetite.  Pt denies any fevers, chills, nausea, vomiting, diarrhea, constipation, abdominal pain, orthopnea, wheezing, palpitations, headache, vision changes, lightheadedness, dizziness, diarrhea, constipation, melena, rectal bleeding.  Review of systems are otherwise negative  Past Medical History  Diagnosis Date  . Hypertension   . Diabetes mellitus without complication ([place])   . Hypercholesteremia   . Cancer [facility])    Past Surgical History  Procedure Laterality Date  . Abdominal hysterectomy    . Back surgery    . Cholecystectomy     Social History:  reports that she quit smoking about 10 years ago. Her smoking use included Cigarettes. She quit after 40 years of use. She has never used smokeless tobacco. She reports that she does not drink alcohol or use illicit drugs. Patient lives at home & is able to participate in activities of daily living  Allergies  Allergen Reactions  . Penicillins Hives    Has patient had a PCN reaction causing immediate rash, facial/tongue/throat swelling, SOB or lightheadedness with hypotension: Yes Has patient had a PCN reaction causing severe rash involving mucus membranes or skin necrosis: No Has patient had a PCN reaction that required hospitalization No Has patient had a PCN reaction  occurring within the last 10 years: No If all of the above answers are "NO", then may proceed with Cephalosporin use.     Family History  Problem Relation Age of Onset  . Cancer Mother   . Diabetes Brother      Prior to Admission medications   Medication Sig Start Date End Date Taking? Authorizing Provider  ALPRAZolam [name]) 0.5 MG tablet Take 0.5 mg by mouth 3 (three) times daily.    Yes Historical Provider, MD  Ascorbic Acid (VITAMIN C GUMMIE PO) Take 2 each by mouth daily.   Yes Historical Provider, MD  aspirin EC 81 MG tablet Take 81 mg by mouth daily.   Yes Historical Provider, MD  atenolol (TENORMIN) 50 MG tablet Take 50 mg by mouth daily.   Yes Historical Provider, MD  b complex vitamins tablet Take 1 tablet by mouth daily.   Yes Historical Provider, MD  Biotin 5000 MCG CAPS Take 1 capsule by mouth daily.   Yes Historical Provider, MD  Calcium-Phosphorus-Vitamin D (CALCIUM GUMMIES PO) Take 500-1,000 mg by mouth daily.   Yes Historical Provider, MD  Cholecalciferol (VITAMIN D) 2000 units CAPS Take 1 capsule by mouth daily.   Yes Historical Provider, MD  Cinnamon 500 MG TABS Take 1,000 mg by mouth daily.    Yes Historical Provider, MD  diphenhydrAMINE (BENADRYL) 25 MG tablet Take 25 mg by mouth daily.   Yes Historical Provider, MD  Garcinia Cambogia-Chromium 500-200 MG-MCG TABS Take 1 tablet by mouth daily.   Yes Historical Provider, MD  insulin aspart (NOVOLOG) 100 UNIT/ML injection Inject 15 Units into the skin  3 (three) times daily before meals. Sliding scale included as directed per MD   Yes Historical Provider, MD  insulin detemir (LEVEMIR) 100 UNIT/ML injection Inject 60 Units into the skin at bedtime.   Yes Historical Provider, MD  INVOKANA 100 MG TABS tablet Take 100 mg by mouth daily. 02/26/15  Yes Historical Provider, MD  Liraglutide (VICTOZA) 18 MG/3ML SOPN Inject 1.2 mg into the skin daily.    Yes Historical Provider, MD  lisinopril (PRINIVIL,ZESTRIL) 20 MG tablet Take 20  mg by mouth daily.   Yes Historical Provider, MD  Misc Natural Products (ESTROVEN + ENERGY MAX STRENGTH) TABS Take 1 tablet by mouth daily.   Yes Historical Provider, MD  Omega-3 Fatty Acids (FISH OIL) 1200 MG CAPS Take 1 capsule by mouth daily.   Yes Historical Provider, MD  sertraline (ZOLOFT) 100 MG tablet Take 100 mg by mouth daily.   Yes Historical Provider, MD  simvastatin (ZOCOR) 40 MG tablet Take 40 mg by mouth daily.   Yes Historical Provider, MD  vitamin E (VITAMIN E) 400 UNIT capsule Take 400 Units by mouth daily.   Yes Historical Provider, MD    Physical Exam: BP 128/75 mmHg  Pulse 109  Temp(Src) 100.7 F (38.2 C) (Oral)  Resp 20  Ht _0  (1.6 m)  Wt 80.287 kg (177 lb)  BMI 31.36 kg/m2  SpO2 96%  General: Elderly Caucasian female. Awake and alert and oriented x3. No acute cardiopulmonary distress.  Eyes: Pupils equal, round, reactive to light. Extraocular muscles are intact. Sclerae anicteric and noninjected.  ENT:  Dry mucosal membranes. No mucosal lesions.   Neck: Neck supple without lymphadenopathy. No carotid bruits. No masses palpated.  Cardiovascular: Tachycardic with irregularly irregular rate. No murmurs, rubs, gallops auscultated. No JVD.  Respiratory: Rales in the left midlung field. Slightly diminished breath sounds. No wheezes or rhonchi.  Abdomen: Soft, nontender, nondistended. Active bowel sounds. No masses or hepatosplenomegaly  Skin: Dry, warm to touch. 2+ dorsalis pedis and radial pulses. Musculoskeletal: No calf or leg pain. All major joints not erythematous nontender.  Psychiatric: Intact judgment and insight.  Neurologic: No focal neurological deficits. Cranial nerves II through XII are grossly intact.           Labs on Admission:  Basic Metabolic Panel:  Recent Labs Lab 02/28/15 1418 02/28/15 1421  NA 130* 132*  K 3.5 3.6  CL 97* 98*  CO2 19*  --   GLUCOSE 200* 198*  BUN 21* 20  CREATININE 0.93 0.90  CALCIUM 8.9  --    Liver Function  Tests:  Recent Labs Lab 02/28/15 1418  AST 39  ALT 32  ALKPHOS 56  BILITOT 1.3*  PROT 7.7  ALBUMIN 3.7   No results for input(s): LIPASE, AMYLASE in the last 168 hours. No results for input(s): AMMONIA in the last 168 hours. CBC:  Recent Labs Lab 02/28/15 1418 02/28/15 1421  WBC 12.7*  --   HGB 13.1 13.9  HCT 38.2 41.0  MCV 85.3  --   PLT 153  --    Cardiac Enzymes:  Recent Labs Lab 02/28/15 1418  TROPONINI 0.04*    BNP (last 3 results)  Recent Labs  02/28/15 1418  BNP 375.0*    ProBNP (last 3 results) No results for input(s): PROBNP in the last 8760 hours.  CBG: No results for input(s): GLUCAP in the last 168 hours.  Radiological Exams on Admission: Dg Chest Port 1 View  02/28/2015  CLINICAL DATA:  Cough, shortness  of breath and myalgias. EXAM: PORTABLE CHEST 1 VIEW COMPARISON:  None. FINDINGS: The patient is rotated to the left, making it difficult to assess the heart size. Probable borderline enlargement of the heart. Dense patchy opacity in the left mid lung zone. Patchy opacity at the medial right lung base. Unremarkable bones. IMPRESSION: 1. Pneumonia in the left mid lung zone. Followup PA and lateral chest X-ray is recommended in 3-4 weeks following trial of antibiotic therapy to ensure resolution and exclude underlying malignancy. 2. Right basilar pneumonia or atelectasis. Electronically Signed   By: Claudie Revering M.D.   On: 02/28/2015 15:04    EKG: Independently reviewed. Tachycardia with irregularly irregular rate. Right bundle branch block with left posterior fascicular block. No acute ST elevation or depression.  Assessment/Plan Present on Admission:  . Atrial fibrillation with rapid ventricular response ([place]) . Elevated troponin I level . Community acquired pneumonia . Acute respiratory failure with hypoxia ([place]) . Lactic acidosis . Sepsis [facility])  This patient was discussed with the ED physician, including pertinent vitals, physical exam  findings, labs, and imaging.  We also discussed care given by the ED provider.  #1 Sepsis #2 atrial fibrillation with rapid ventricular response  Admit to stepdown at [place] drip - will rebolus with 20 mg of Cardizem  Troponins every 3 hours 3  TSH  If patient continues to not respond to Cardizem, may need to consult cardiology for assistance   CHADS-VASC score of 4 #3 acute respiratory failure with hypoxia  Oxygen via nasal cannula  #4 community acquired pneumonia  Continue Levaquin and vancomycin  Sputum cultures  Strep and Legionella antigen by urine  Repeat CBC in the morning  Flu swab pending #5 lactic acidosis  Into the IV fluids and recheck lactic acid and 3 hours  #6 elevated troponin I level  Trend troponins  #7 diabetes  CBG before meals and daily at bedtime  Continue home to glycemic   Sliding scale insulin to cover  #8 hypertension  Continue home antihypertensives  DVT prophylaxis: Full dose Lovenox   Consultants:   Code Status: Full code   Family Communication: Husband and daughter in room   Disposition Plan: Admission to stepdown at [place], [place] [facility] Pager [phone]

## 2015-02-28 NOTE — ED Notes (Signed)
Called [facility] with bed assignment at [facility].

## 2015-02-28 NOTE — ED Provider Notes (Signed)
CSN: [id number]     Arrival date & time 02/28/15  1247 History   First MD Initiated Contact with Patient 02/28/15 1357     Chief Complaint  Patient presents with  . Emesis     (Consider location/radiation/quality/duration/timing/severity/associated sxs/prior Treatment) Patient is a 70 y.o. female presenting with vomiting. The history is provided by the patient and a relative.  Emesis Associated symptoms: diarrhea   Associated symptoms: no abdominal pain, no headaches and no sore throat   Patient presents w very poor po intake for the past few days.  Nausea. Had episode vomiting, not bloody or bilious.  Has has a few diarrheal stools, watery.  No bloody diarrhea. No abd pain.  Patient also c/o diffuse body aches, and prod cough in the past week.  Pt indicates not able to take meds for the past few days.  +sob.  Denies chest pain. Symptoms acute onset, persistent/progressive, mod-severe without specific exacerbating or alleviating factors.  Denies known ill contacts.      Past Medical History  Diagnosis Date  . Hypertension   . Diabetes mellitus without complication ([place])   . Hypercholesteremia   . Cancer [facility])    Past Surgical History  Procedure Laterality Date  . Abdominal hysterectomy    . Back surgery    . Cholecystectomy     Family History  Problem Relation Age of Onset  . Cancer Mother   . Diabetes Brother    Social History  Substance Use Topics  . Smoking status: Former Smoker -- 40 years    Types: Cigarettes    Quit date: 01/10/2005  . Smokeless tobacco: Never Used  . Alcohol Use: No   OB History    Gravida Para Term Preterm AB TAB SAB Ectopic Multiple Living   _0 Review of Systems  Constitutional: Positive for fever.  HENT: Negative for rhinorrhea, sore throat and trouble swallowing.   Eyes: Negative for discharge and redness.  Respiratory: Positive for cough and shortness of breath.   Cardiovascular: Negative for chest pain and leg  swelling.  Gastrointestinal: Positive for nausea, vomiting and diarrhea. Negative for abdominal pain.  Endocrine: Negative for polyuria.  Genitourinary: Negative for dysuria and flank pain.  Musculoskeletal: Negative for back pain, neck pain and neck stiffness.  Skin: Negative for rash.  Neurological: Negative for headaches.  Hematological: Does not bruise/bleed easily.  Psychiatric/Behavioral: Negative for confusion.      Allergies  Penicillins  Home Medications   Prior to Admission medications   Medication Sig Start Date End Date Taking? Authorizing Provider  ALPRAZolam [name]) 0.5 MG tablet Take 0.5 mg by mouth at bedtime as needed for anxiety.    Historical Provider, MD  b complex vitamins tablet Take 1 tablet by mouth daily.    Historical Provider, MD  Cinnamon 500 MG TABS Take by mouth daily.    Historical Provider, MD  insulin aspart (NOVOLOG) 100 UNIT/ML injection Inject 15-21 Units into the skin 3 (three) times daily before meals.    Historical Provider, MD  insulin detemir (LEVEMIR) 100 UNIT/ML injection Inject 60 Units into the skin at bedtime.    Historical Provider, MD  Liraglutide (VICTOZA) 18 MG/3ML SOPN Inject 1.8 mg into the skin daily.    Historical Provider, MD  vitamin E (VITAMIN E) 400 UNIT capsule Take 400 Units by mouth daily.    Historical Provider, MD   BP 146/68 mmHg  Pulse 164  Temp(Src)  100.7 F (38.2 C) (Oral)  Resp 14  Ht _0  (1.6 m)  Wt 80.287 kg  BMI 31.36 kg/m2  SpO2 94% Physical Exam  Constitutional: She is oriented to person, place, and time. She appears distressed.  Pt dyspneic, tachycardic.   HENT:  Nose: Nose normal.  Mouth/Throat: Oropharynx is clear and moist.  Eyes: Conjunctivae are normal. No scleral icterus.  Neck: Neck supple. No tracheal deviation present. No thyromegaly present.  No stiffness or rigidity  Cardiovascular: Intact distal pulses.  Exam reveals no gallop and no friction rub.   No murmur heard. Tachycardic,  irregular.   Pulmonary/Chest: Effort normal. No respiratory distress.  Rhonchi bilat.   Abdominal: Soft. Normal appearance and bowel sounds are normal. She exhibits no distension and no mass. There is no tenderness. There is no rebound and no guarding.  Genitourinary:  No cva tenderness  Musculoskeletal: She exhibits no edema or tenderness.  Neurological: She is alert and oriented to person, place, and time.  Skin: Skin is warm and dry. No rash noted. She is not diaphoretic.  Psychiatric: She has a normal mood and affect.  Nursing note and vitals reviewed.   ED Course  Procedures (including critical care time) Labs Review   Results for orders placed or performed during the hospital encounter of 02/28/15  CBC  Result Value Ref Range   WBC 12.7 (H) 4.0 - 10.5 K/uL   RBC 4.48 3.87 - 5.11 MIL/uL   Hemoglobin 13.1 12.0 - 15.0 g/dL   HCT 38.2 36.0 - 46.0 %   MCV 85.3 78.0 - 100.0 fL   MCH 29.2 26.0 - 34.0 pg   MCHC 34.3 30.0 - 36.0 g/dL   RDW 13.7 11.5 - 15.5 %   Platelets 153 150 - 400 K/uL  Comprehensive metabolic panel  Result Value Ref Range   Sodium 130 (L) 135 - 145 mmol/L   Potassium 3.5 3.5 - 5.1 mmol/L   Chloride 97 (L) 101 - 111 mmol/L   CO2 19 (L) 22 - 32 mmol/L   Glucose, Bld 200 (H) 65 - 99 mg/dL   BUN 21 (H) 6 - 20 mg/dL   Creatinine, Ser 0.93 0.44 - 1.00 mg/dL   Calcium 8.9 8.9 - 10.3 mg/dL   Total Protein 7.7 6.5 - 8.1 g/dL   Albumin 3.7 3.5 - 5.0 g/dL   AST 39 15 - 41 U/L   ALT 32 14 - 54 U/L   Alkaline Phosphatase 56 38 - 126 U/L   Total Bilirubin 1.3 (H) 0.3 - 1.2 mg/dL   GFR calc non Af Amer >60 >60 mL/min   GFR calc Af Amer >60 >60 mL/min   Anion gap 14 5 - 15  Brain natriuretic peptide  Result Value Ref Range   B Natriuretic Peptide 375.0 (H) 0.0 - 100.0 pg/mL  Troponin I  Result Value Ref Range   Troponin I 0.04 (H) <0.031 ng/mL  I-stat chem 8, ed  Result Value Ref Range   Sodium 132 (L) 135 - 145 mmol/L   Potassium 3.6 3.5 - 5.1 mmol/L    Chloride 98 (L) 101 - 111 mmol/L   BUN 20 6 - 20 mg/dL   Creatinine, Ser 0.90 0.44 - 1.00 mg/dL   Glucose, Bld 198 (H) 65 - 99 mg/dL   Calcium, Ion 1.07 (L) 1.13 - 1.30 mmol/L   TCO2 20 0 - 100 mmol/L   Hemoglobin 13.9 12.0 - 15.0 g/dL   HCT 41.0 36.0 - 46.0 %  I-Stat  CG4 Lactic Acid, ED  Result Value Ref Range   Lactic Acid, Venous 2.70 (HH) 0.5 - 2.0 mmol/L   Comment NOTIFIED PHYSICIAN    Dg Chest Port 1 View  02/28/2015  CLINICAL DATA:  Cough, shortness of breath and myalgias. EXAM: PORTABLE CHEST 1 VIEW COMPARISON:  None. FINDINGS: The patient is rotated to the left, making it difficult to assess the heart size. Probable borderline enlargement of the heart. Dense patchy opacity in the left mid lung zone. Patchy opacity at the medial right lung base. Unremarkable bones. IMPRESSION: 1. Pneumonia in the left mid lung zone. Followup PA and lateral chest X-ray is recommended in 3-4 weeks following trial of antibiotic therapy to ensure resolution and exclude underlying malignancy. 2. Right basilar pneumonia or atelectasis. Electronically Signed   By: Claudie Revering M.D.   On: 02/28/2015 15:04        I have personally reviewed and evaluated these images and lab results as part of my medical decision-making.   EKG Interpretation   Date/Time:  Saturday February 28 2015 14:00:26 EST Ventricular Rate:  178 PR Interval:    QRS Duration: 122 QT Interval:  277 QTC Calculation: 477 R Axis:   107 Text Interpretation:  Atrial fibrillation with rapid ventricular response  Rightward axis RBBB and LPFB Non-specific ST-t changes Confirmed by Ashok Cordia   MD, Lennette Bihari ([id number]) on 02/28/2015 2:27:36 PM      MDM   Iv ns. Continuous pulse ox and monitor. o2 [place].   Pt appears dehydrated on exam, iv ns bolus.  Labs and cultures sent.  Initial lactate high.  Ns bolus.   Pt tachycardic, irregular - afib w rvr.  No hx same. Pt denies chest pain.  cardizem bolus and gtt.  After cultures sent, iv abx  ordered, pt noted to have allergy to pcn.  Flu panel added to labs. Droplet/resp precautions.   Reviewed nursing notes and prior charts for additional history.   Recheck hr sl improved 140.   Additional cardizem bolus/gtt. No chest pain.   Discussed with Hospitalist, Dr Nehemiah Settle - he indicates he will see, requests temp orders to stepdown.   Rhonchi bil, mild wheeze. Alb neb.  Repeat lactate pending.  CRITICAL CARE  RE CAP, severe sepsis, dehydration, afib/rvr, dyspnea.  Performed by: Mirna Mires Total critical care time: 40 minutes Critical care time was exclusive of separately billable procedures and treating other patients. Critical care was necessary to treat or prevent imminent or life-threatening deterioration. Critical care was time spent personally by me on the following activities: development of treatment plan with patient and/or surrogate as well as nursing, discussions with consultants, evaluation of patient's response to treatment, examination of patient, obtaining history from patient or surrogate, ordering and performing treatments and interventions, ordering and review of laboratory studies, ordering and review of radiographic studies, pulse oximetry and re-evaluation of patient's condition.      Lajean Saver, MD 02/28/15 [phone]

## 2015-03-01 ENCOUNTER — Inpatient Hospital Stay (HOSPITAL_COMMUNITY): Payer: Commercial Managed Care - HMO

## 2015-03-01 DIAGNOSIS — A419 Sepsis, unspecified organism: Principal | ICD-10-CM

## 2015-03-01 LAB — GLUCOSE, CAPILLARY
GLUCOSE-CAPILLARY: 164 mg/dL — AB (ref 65–99)
Glucose-Capillary: 161 mg/dL — ABNORMAL HIGH (ref 65–99)
Glucose-Capillary: 127 mg/dL — ABNORMAL HIGH (ref 65–99)
Glucose-Capillary: 114 mg/dL — ABNORMAL HIGH (ref 65–99)
Glucose-Capillary: 166 mg/dL — ABNORMAL HIGH (ref 65–99)

## 2015-03-01 LAB — CBC
HCT: 31.3 % — ABNORMAL LOW (ref 36.0–46.0)
HEMOGLOBIN: 10.1 g/dL — AB (ref 12.0–15.0)
MCH: 27.4 pg (ref 26.0–34.0)
MCHC: 32.3 g/dL (ref 30.0–36.0)
MCV: 84.8 fL (ref 78.0–100.0)
Platelets: 153 10*3/uL (ref 150–400)
RBC: 3.69 MIL/uL — ABNORMAL LOW (ref 3.87–5.11)
RDW: 13.9 % (ref 11.5–15.5)
WBC: 7.9 10*3/uL (ref 4.0–10.5)

## 2015-03-01 LAB — T4, FREE: Free T4: 1.09 ng/dL (ref 0.61–1.12)

## 2015-03-01 LAB — TROPONIN I
TROPONIN I: 0.11 ng/mL — AB (ref <0.031)
TROPONIN I: 0.3 ng/mL — AB (ref <0.031)
TROPONIN I: 0.08 ng/mL — AB (ref <0.031)

## 2015-03-01 LAB — MRSA PCR SCREENING: MRSA BY PCR: NEGATIVE

## 2015-03-01 LAB — TSH: TSH: 2.507 u[IU]/mL (ref 0.350–4.500)

## 2015-03-01 LAB — LACTIC ACID, PLASMA: Lactic Acid, Venous: 1.3 mmol/L (ref 0.5–2.0)

## 2015-03-01 LAB — BRAIN NATRIURETIC PEPTIDE: B Natriuretic Peptide: 408.7 pg/mL — ABNORMAL HIGH (ref 0.0–100.0)

## 2015-03-01 LAB — HIV ANTIBODY (ROUTINE TESTING W REFLEX): HIV SCREEN 4TH GENERATION: NONREACTIVE

## 2015-03-01 MED ORDER — HYDROCODONE-ACETAMINOPHEN 5-325 MG PO TABS
1.0000 | ORAL_TABLET | ORAL | Status: DC | PRN
  Administered 2015-03-01 – 2015-03-02 (×4): 1 via ORAL
  Filled 2015-03-01 (×4): qty 1

## 2015-03-01 MED ORDER — LEVOFLOXACIN IN D5W 750 MG/150ML IV SOLN
750.0000 mg | INTRAVENOUS | Status: DC
  Filled 2015-03-01: qty 150

## 2015-03-01 MED ORDER — METOPROLOL TARTRATE 1 MG/ML IV SOLN
5.0000 mg | Freq: Four times a day (QID) | INTRAVENOUS | Status: DC
  Administered 2015-03-01: 5 mg via INTRAVENOUS
  Filled 2015-03-01: qty 5

## 2015-03-01 MED ORDER — VANCOMYCIN HCL IN DEXTROSE 750-5 MG/150ML-% IV SOLN
750.0000 mg | Freq: Two times a day (BID) | INTRAVENOUS | Status: DC
  Administered 2015-03-01: 750 mg via INTRAVENOUS
  Filled 2015-03-01 (×2): qty 150

## 2015-03-01 MED ORDER — LISINOPRIL 20 MG PO TABS
20.0000 mg | ORAL_TABLET | Freq: Every day | ORAL | Status: DC
  Administered 2015-03-01: 20 mg via ORAL
  Filled 2015-03-01: qty 1

## 2015-03-01 MED ORDER — METOPROLOL TARTRATE 1 MG/ML IV SOLN
5.0000 mg | Freq: Once | INTRAVENOUS | Status: AC
  Administered 2015-03-01: 5 mg via INTRAVENOUS

## 2015-03-01 MED ORDER — SODIUM CHLORIDE 0.9 % IV SOLN
INTRAVENOUS | Status: DC
  Administered 2015-03-01: 150 mL via INTRAVENOUS
  Administered 2015-03-02 (×2): via INTRAVENOUS

## 2015-03-01 MED ORDER — POTASSIUM CHLORIDE IN NACL 20-0.9 MEQ/L-% IV SOLN
INTRAVENOUS | Status: DC
  Administered 2015-03-01: 125 mL via INTRAVENOUS
  Filled 2015-03-01 (×3): qty 1000

## 2015-03-01 MED ORDER — HYDROCOD POLST-CPM POLST ER 10-8 MG/5ML PO SUER
5.0000 mL | Freq: Two times a day (BID) | ORAL | Status: DC | PRN
  Administered 2015-03-01 – 2015-03-10 (×16): 5 mL via ORAL
  Filled 2015-03-01 (×16): qty 5

## 2015-03-01 MED ORDER — ASPIRIN EC 81 MG PO TBEC
81.0000 mg | DELAYED_RELEASE_TABLET | Freq: Every day | ORAL | Status: DC
  Administered 2015-03-01 – 2015-03-09 (×9): 81 mg via ORAL
  Filled 2015-03-01 (×10): qty 1

## 2015-03-01 MED ORDER — BENZONATATE 100 MG PO CAPS
200.0000 mg | ORAL_CAPSULE | Freq: Three times a day (TID) | ORAL | Status: DC | PRN
  Administered 2015-03-01 – 2015-03-08 (×11): 200 mg via ORAL
  Filled 2015-03-01 (×4): qty 2
  Filled 2015-03-01: qty 1
  Filled 2015-03-01 (×9): qty 2

## 2015-03-01 MED ORDER — ALPRAZOLAM 0.5 MG PO TABS
0.5000 mg | ORAL_TABLET | Freq: Three times a day (TID) | ORAL | Status: DC
  Administered 2015-03-01 – 2015-03-05 (×13): 0.5 mg via ORAL
  Filled 2015-03-01 (×13): qty 1

## 2015-03-01 MED ORDER — INSULIN DETEMIR 100 UNIT/ML ~~LOC~~ SOLN
40.0000 [IU] | Freq: Every day | SUBCUTANEOUS | Status: DC
  Administered 2015-03-01 – 2015-03-05 (×5): 40 [IU] via SUBCUTANEOUS
  Filled 2015-03-01 (×6): qty 0.4

## 2015-03-01 MED ORDER — LEVOFLOXACIN 750 MG PO TABS
750.0000 mg | ORAL_TABLET | Freq: Every day | ORAL | Status: AC
  Administered 2015-03-01 – 2015-03-04 (×4): 750 mg via ORAL
  Filled 2015-03-01 (×4): qty 1

## 2015-03-01 MED ORDER — CANAGLIFLOZIN 100 MG PO TABS
100.0000 mg | ORAL_TABLET | Freq: Every day | ORAL | Status: DC
  Administered 2015-03-01: 100 mg via ORAL
  Filled 2015-03-01: qty 1

## 2015-03-01 MED ORDER — INSULIN ASPART 100 UNIT/ML ~~LOC~~ SOLN
0.0000 [IU] | Freq: Every day | SUBCUTANEOUS | Status: DC
  Administered 2015-03-03: 2 [IU] via SUBCUTANEOUS
  Administered 2015-03-04: 4 [IU] via SUBCUTANEOUS
  Administered 2015-03-05: 3 [IU] via SUBCUTANEOUS

## 2015-03-01 MED ORDER — ENOXAPARIN SODIUM 80 MG/0.8ML ~~LOC~~ SOLN
1.0000 mg/kg | Freq: Two times a day (BID) | SUBCUTANEOUS | Status: DC
  Administered 2015-03-01 – 2015-03-09 (×18): 80 mg via SUBCUTANEOUS
  Filled 2015-03-01 (×20): qty 0.8

## 2015-03-01 MED ORDER — INSULIN ASPART 100 UNIT/ML ~~LOC~~ SOLN
0.0000 [IU] | Freq: Three times a day (TID) | SUBCUTANEOUS | Status: DC
Start: 2015-03-01 — End: 2015-03-03
  Administered 2015-03-01: 2 [IU] via SUBCUTANEOUS
  Administered 2015-03-01: 3 [IU] via SUBCUTANEOUS
  Administered 2015-03-02: 2 [IU] via SUBCUTANEOUS
  Administered 2015-03-02 (×2): 3 [IU] via SUBCUTANEOUS
  Administered 2015-03-03: 5 [IU] via SUBCUTANEOUS
  Administered 2015-03-03 (×2): 3 [IU] via SUBCUTANEOUS

## 2015-03-01 MED ORDER — LIRAGLUTIDE 18 MG/3ML ~~LOC~~ SOPN: 1.2000 mg | PEN_INJECTOR | Freq: Every day | SUBCUTANEOUS | Status: DC

## 2015-03-01 MED ORDER — INSULIN DETEMIR 100 UNIT/ML ~~LOC~~ SOLN
60.0000 [IU] | Freq: Every day | SUBCUTANEOUS | Status: DC
Start: 2015-03-01 — End: 2015-03-01
  Filled 2015-03-01: qty 0.6

## 2015-03-01 MED ORDER — SIMVASTATIN 40 MG PO TABS
40.0000 mg | ORAL_TABLET | Freq: Every day | ORAL | Status: DC
  Administered 2015-03-01 – 2015-03-06 (×6): 40 mg via ORAL
  Filled 2015-03-01 (×6): qty 1

## 2015-03-01 MED ORDER — INSULIN ASPART 100 UNIT/ML ~~LOC~~ SOLN
8.0000 [IU] | Freq: Three times a day (TID) | SUBCUTANEOUS | Status: DC
  Administered 2015-03-01 – 2015-03-04 (×6): 8 [IU] via SUBCUTANEOUS

## 2015-03-01 MED ORDER — INSULIN ASPART 100 UNIT/ML ~~LOC~~ SOLN
15.0000 [IU] | Freq: Three times a day (TID) | SUBCUTANEOUS | Status: DC
Start: 2015-03-01 — End: 2015-03-01
  Administered 2015-03-01: 15 [IU] via SUBCUTANEOUS

## 2015-03-01 MED ORDER — METOPROLOL TARTRATE 1 MG/ML IV SOLN
10.0000 mg | Freq: Four times a day (QID) | INTRAVENOUS | Status: DC
  Administered 2015-03-01 – 2015-03-05 (×16): 10 mg via INTRAVENOUS
  Filled 2015-03-01 (×16): qty 10

## 2015-03-01 MED ORDER — SERTRALINE HCL 100 MG PO TABS
100.0000 mg | ORAL_TABLET | Freq: Every day | ORAL | Status: DC
  Administered 2015-03-01 – 2015-03-12 (×11): 100 mg via ORAL
  Filled 2015-03-01 (×12): qty 1

## 2015-03-01 MED ORDER — DILTIAZEM HCL 100 MG IV SOLR
5.0000 mg/h | INTRAVENOUS | Status: DC
  Administered 2015-03-01 – 2015-03-06 (×18): 15 mg/h via INTRAVENOUS
  Filled 2015-03-01 (×19): qty 100

## 2015-03-01 MED ORDER — BENZONATATE 100 MG PO CAPS
100.0000 mg | ORAL_CAPSULE | Freq: Three times a day (TID) | ORAL | Status: DC | PRN
  Administered 2015-03-01: 100 mg via ORAL
  Filled 2015-03-01 (×2): qty 1

## 2015-03-01 MED ORDER — IPRATROPIUM-ALBUTEROL 0.5-2.5 (3) MG/3ML IN SOLN
3.0000 mL | Freq: Once | RESPIRATORY_TRACT | Status: DC
  Filled 2015-03-01: qty 3

## 2015-03-01 NOTE — Progress Notes (Signed)
ANTICOAGULATION CONSULT NOTE - Initial Consult  Pharmacy Consult for Lovenox  Indication: atrial fibrillation  Patient Measurements: Height: _0  (160 cm) Weight: 172 lb 12.8 oz (78.382 kg) IBW/kg (Calculated) : 52.4  Vital Signs: Temp: 98.2 F (36.8 C) (02/19 0000) Temp Source: Oral (02/19 0000) BP: 130/69 mmHg (02/19 0300) Pulse Rate: 145 (02/19 0300)  Labs:  Recent Labs  02/28/15 1418 02/28/15 1421  HGB 13.1 13.9  HCT 38.2 41.0  PLT 153  --   CREATININE 0.93 0.90  TROPONINI 0.04*  --    Estimated Creatinine Clearance: 56.8 mL/min (by C-G formula based on Cr of 0.9).  Medical History: Past Medical History  Diagnosis Date  . Hypertension   . Diabetes mellitus without complication ([place])   . Hypercholesteremia   . Cancer [facility])    Assessment: 70 y/o F with 3 weeks of worsening dyspnea, found to be in afib/RVR, also with PNA per CXR, CBC good, renal function good, no anti-coagulation PTA.   Goal of Therapy:  Monitor platelets by anticoagulation protocol: Yes   Plan:  -Lovenox 1 mg/kg subcutaneous q12h -Minimum q72h CBC while inpatient -Monitor for bleeding  Narda Bonds 03/01/2015,3:45 AM

## 2015-03-01 NOTE — Progress Notes (Signed)
[place] TEAM 1 - Stepdown/ICU TEAM PROGRESS NOTE  Ellen Jordan PQD:[id number] DOB: May 19, 1945 DOA: 02/28/2015 PCP: Robert Bellow, MD  Admit HPI / Brief Narrative: 70 y.o. female [name], HTN, [name] who presented with 3 weeks of worsening dyspnea on exertion w/ a productive cough and  fever.   HPI/Subjective: Pt is c/o ongiong hectic cough.  She denies cp, but does report feeling palpitations.  She denies nausea, vomiting, or abdom pain.    Assessment/Plan:  New onset Afib w/ RVR Rate poorly controlled - cont to hydrate - TSH normal - suspect being driven by acute illness/PNA  Bibasilar Influenza B PNA w/ acute hypoxic respiratory failure  Cont tamiflu - hydrate - cont empiric coverage for possible CAP superinfection   Elevated troponin Peaked at 0.3 - explained by her RVR   HTN BP currently reasonably controlled  DM2 CBG reasonably controlled   [name]  Code Status: FULL Family Communication: spoke w/ husband at bedside  Disposition Plan: SDU  Consultants: none  Procedures: none  Antibiotics: Aztreonam 2/18 > Tamiflu 2/18 > Vanc 2/18 > 2/19 Levaquin 2/18  DVT prophylaxis: Full dose lovenox   Objective: Blood pressure 148/80, pulse 141, temperature 98.2 F (36.8 C), temperature source Oral, resp. rate 27, height _0  (1.6 m), weight 78.382 kg (172 lb 12.8 oz), SpO2 96 %.  Intake/Output Summary (Last 24 hours) at 03/01/15 1049 Last data filed at 03/01/15 0700  Gross per 24 hour  Intake 1241.25 ml  Output    300 ml  Net 941.25 ml    Exam: General: No severe acute respiratory distress Lungs: bibasilar crackles - no wheeze  Cardiovascular: Irregularly irregular - tachycardic - no appreciable murmur Abdomen: Nontender, nondistended, soft, bowel sounds positive, no rebound, no ascites, no appreciable mass Extremities: No significant cyanosis, clubbing, or edema bilateral lower extremities  Data Reviewed:  Basic Metabolic Panel:  Recent  Labs Lab 02/28/15 1418 02/28/15 1421  NA 130* 132*  K 3.5 3.6  CL 97* 98*  CO2 19*  --   GLUCOSE 200* 198*  BUN 21* 20  CREATININE 0.93 0.90  CALCIUM 8.9  --     CBC:  Recent Labs Lab 02/28/15 1418 02/28/15 1421 03/01/15 0350  WBC 12.7*  --  7.9  HGB 13.1 13.9 10.1*  HCT 38.2 41.0 31.3*  MCV 85.3  --  84.8  PLT 153  --  153    Liver Function Tests:  Recent Labs Lab 02/28/15 1418  AST 39  ALT 32  ALKPHOS 56  BILITOT 1.3*  PROT 7.7  ALBUMIN 3.7   Cardiac Enzymes:  Recent Labs Lab 02/28/15 1418 03/01/15 0350 03/01/15 0351 03/01/15 0914  TROPONINI 0.04* 0.11* 0.30* 0.08*    CBG:  Recent Labs Lab 03/01/15 0522 03/01/15 0733  GLUCAP 161* 164*    Recent Results (from the past 240 hour(s))  Blood culture (routine x 2)     Status: None (Preliminary result)   Collection Time: 02/28/15  2:38 PM  Result Value Ref Range Status   Specimen Description BLOOD RIGHT HAND  Final   Special Requests BOTTLES DRAWN AEROBIC AND ANAEROBIC 6CC EACH  Final   Culture NO GROWTH < 24 HOURS  Final   Report Status PENDING  Incomplete  Blood culture (routine x 2)     Status: None (Preliminary result)   Collection Time: 02/28/15  2:44 PM  Result Value Ref Range Status   Specimen Description LEFT ANTECUBITAL  Final   Special Requests  BOTTLES DRAWN AEROBIC AND ANAEROBIC 6CC EACH  Final   Culture NO GROWTH < 24 HOURS  Final   Report Status PENDING  Incomplete  MRSA PCR Screening     Status: None   Collection Time: 02/28/15  9:23 PM  Result Value Ref Range Status   MRSA by PCR NEGATIVE NEGATIVE Final    Comment:        The GeneXpert MRSA Assay (FDA approved for NASAL specimens only), is one component of a comprehensive MRSA colonization surveillance program. It is not intended to diagnose MRSA infection nor to guide or monitor treatment for MRSA infections.      Studies:   Recent x-ray studies have been reviewed in detail by the Attending Physician  Scheduled  Meds:  Scheduled Meds: . ALPRAZolam  0.5 mg Oral TID  . aspirin EC  81 mg Oral Daily  . aztreonam  1 g Intravenous Q8H  . canagliflozin  100 mg Oral Daily  . enoxaparin (LOVENOX) injection  1 mg/kg Subcutaneous Q12H  . insulin aspart  0-15 Units Subcutaneous TID WC  . insulin aspart  0-5 Units Subcutaneous QHS  . insulin aspart  15 Units Subcutaneous TID AC  . insulin detemir  60 Units Subcutaneous QHS  . ipratropium-albuterol  3 mL Nebulization Once  . levofloxacin (LEVAQUIN) IV  750 mg Intravenous Q24H  . Liraglutide  1.2 mg Subcutaneous Daily  . lisinopril  20 mg Oral Daily  . oseltamivir  30 mg Oral BID  . sertraline  100 mg Oral Daily  . simvastatin  40 mg Oral Daily  . vancomycin  750 mg Intravenous Q12H    Time spent on care of this patient: 35 mins   Morton Simson T , MD   [facility] Office  [phone] Pager - Text Page per Shea Evans as per below:  On-Call/Text Page:      [email or web address]      password [email or web address]  If 7PM-7AM, please contact night-coverage [email or web address] Password [email or web address] 03/01/2015, 10:49 AM   LOS: 1 day

## 2015-03-01 NOTE — Progress Notes (Signed)
Tye Savoy NP made aware of increase cough with no relief from earlier PRN cough medicine. Also made aware of distant breath sounds earlier and now with bilateral rhonchi with possible crackles. Aware of continued Afib/RVR , B/P and sats.

## 2015-03-01 NOTE — Progress Notes (Signed)
Called NP Schorr with [facility], aware patient is still in Afib RVR maxed out on cardizem gtt, also aware anticoagulation is full dose lovenox SQ, orders to give 39m IV lopressor. Will continue to monitor closely.

## 2015-03-01 NOTE — Consult Note (Signed)
Pharmacy Antibiotic Note  Ellen Jordan is a 70 y.o. female admitted on 02/28/2015 with sepsis.  Pharmacy has been consulted for levofloxacin dosing.   Pt diagnosed with influenza pneumonia - positive for influenza B. Covering for CAP with levofloxacin.   Plan: Levofloxacin 750 mg PO q24h for total of 5 days Oseltamivir 30 mg PO BID for 5 days Monitor renal function and cultures  Height: 5' 3" (160 cm) Weight: 172 lb 12.8 oz (78.382 kg) IBW/kg (Calculated) : 52.4  Temp (24hrs), Avg:98.8 F (37.1 C), Min:97.9 F (36.6 C), Max:100.7 F (38.2 C)   Recent Labs Lab 02/28/15 1418 02/28/15 1421 02/28/15 1749 03/01/15 0350  WBC 12.7*  --   --  7.9  CREATININE 0.93 0.90  --   --   LATICACIDVEN  --  2.70* 2.93*  --     Estimated Creatinine Clearance: 56.8 mL/min (by C-G formula based on Cr of 0.9).    Allergies  Allergen Reactions  . Penicillins Hives    Has patient had a PCN reaction causing immediate rash, facial/tongue/throat swelling, SOB or lightheadedness with hypotension: Yes Has patient had a PCN reaction causing severe rash involving mucus membranes or skin necrosis: No Has patient had a PCN reaction that required hospitalization No Has patient had a PCN reaction occurring within the last 10 years: No If all of the above answers are "NO", then may proceed with Cephalosporin use.     Thank you for allowing pharmacy to be a part of this patient's care.  Roma Schanz 03/01/2015 11:19 AM

## 2015-03-01 NOTE — Progress Notes (Signed)
Order for 1 time Duoneb treatment not given due to Tachycardia.  Pt found in A-fib RVR with a HR ranging from 130-150 at time of encounter.  Pt complains of persistent cough with light expectoration at this time.  No accessory muscle use noted and she is able to speak in full sentences.  She also states that previously she was able to cough more up.  Auscultation to upper lung segments is clear with scattered rhonchi in the bases.  RT will add bubble humidification to nasal canula and try to make patient comfortable.

## 2015-03-01 NOTE — Progress Notes (Signed)
Utilization Review Completed.Ellen Jordan T2/19/2017  

## 2015-03-02 ENCOUNTER — Inpatient Hospital Stay (HOSPITAL_COMMUNITY): Payer: Commercial Managed Care - HMO

## 2015-03-02 DIAGNOSIS — I4891 Unspecified atrial fibrillation: Secondary | ICD-10-CM

## 2015-03-02 DIAGNOSIS — R7989 Other specified abnormal findings of blood chemistry: Secondary | ICD-10-CM

## 2015-03-02 DIAGNOSIS — J189 Pneumonia, unspecified organism: Secondary | ICD-10-CM

## 2015-03-02 DIAGNOSIS — J9690 Respiratory failure, unspecified, unspecified whether with hypoxia or hypercapnia: Secondary | ICD-10-CM | POA: Diagnosis present

## 2015-03-02 DIAGNOSIS — J9601 Acute respiratory failure with hypoxia: Secondary | ICD-10-CM

## 2015-03-02 DIAGNOSIS — R0600 Dyspnea, unspecified: Secondary | ICD-10-CM | POA: Diagnosis present

## 2015-03-02 DIAGNOSIS — R0602 Shortness of breath: Secondary | ICD-10-CM | POA: Diagnosis present

## 2015-03-02 LAB — COMPREHENSIVE METABOLIC PANEL
ALBUMIN: 2.5 g/dL — AB (ref 3.5–5.0)
ALK PHOS: 45 U/L (ref 38–126)
ALT: 29 U/L (ref 14–54)
AST: 33 U/L (ref 15–41)
Anion gap: 9 (ref 5–15)
BILIRUBIN TOTAL: 0.6 mg/dL (ref 0.3–1.2)
BUN: 16 mg/dL (ref 6–20)
CALCIUM: 8.3 mg/dL — AB (ref 8.9–10.3)
CO2: 18 mmol/L — ABNORMAL LOW (ref 22–32)
Chloride: 110 mmol/L (ref 101–111)
Creatinine, Ser: 0.89 mg/dL (ref 0.44–1.00)
GFR calc Af Amer: >60 mL/min (ref >60)
GFR calc non Af Amer: >60 mL/min (ref >60)
GLUCOSE: 149 mg/dL — AB (ref 65–99)
Potassium: 3.4 mmol/L — ABNORMAL LOW (ref 3.5–5.1)
Sodium: 137 mmol/L (ref 135–145)
TOTAL PROTEIN: 5.8 g/dL — AB (ref 6.5–8.1)

## 2015-03-02 LAB — CBC
HEMATOCRIT: 30.1 % — AB (ref 36.0–46.0)
Hemoglobin: 9.7 g/dL — ABNORMAL LOW (ref 12.0–15.0)
MCH: 27.6 pg (ref 26.0–34.0)
MCHC: 32.2 g/dL (ref 30.0–36.0)
MCV: 85.8 fL (ref 78.0–100.0)
Platelets: 167 10*3/uL (ref 150–400)
RBC: 3.51 MIL/uL — ABNORMAL LOW (ref 3.87–5.11)
RDW: 14.5 % (ref 11.5–15.5)
WBC: 8.3 10*3/uL (ref 4.0–10.5)

## 2015-03-02 LAB — BASIC METABOLIC PANEL
Anion gap: 8 (ref 5–15)
BUN: 17 mg/dL (ref 6–20)
CALCIUM: 7.9 mg/dL — AB (ref 8.9–10.3)
CO2: 17 mmol/L — ABNORMAL LOW (ref 22–32)
CREATININE: 0.85 mg/dL (ref 0.44–1.00)
Chloride: 110 mmol/L (ref 101–111)
Glucose, Bld: 131 mg/dL — ABNORMAL HIGH (ref 65–99)
Potassium: 3.6 mmol/L (ref 3.5–5.1)
SODIUM: 135 mmol/L (ref 135–145)

## 2015-03-02 LAB — GLUCOSE, CAPILLARY
GLUCOSE-CAPILLARY: 161 mg/dL — AB (ref 65–99)
GLUCOSE-CAPILLARY: 142 mg/dL — AB (ref 65–99)
Glucose-Capillary: 155 mg/dL — ABNORMAL HIGH (ref 65–99)

## 2015-03-02 LAB — MRSA PCR SCREENING: MRSA by PCR: NEGATIVE

## 2015-03-02 LAB — POCT I-STAT 3, ART BLOOD GAS (G3+)
Acid-base deficit: 9 mmol/L — ABNORMAL HIGH (ref 0.0–2.0)
Bicarbonate: 17.7 mEq/L — ABNORMAL LOW (ref 20.0–24.0)
O2 SAT: 99 %
TCO2: 19 mmol/L (ref 0–100)
pCO2 arterial: 39.9 mmHg (ref 35.0–45.0)
pH, Arterial: 7.253 — ABNORMAL LOW (ref 7.350–7.450)
pO2, Arterial: 136 mmHg — ABNORMAL HIGH (ref 80.0–100.0)

## 2015-03-02 LAB — PHOSPHORUS
PHOSPHORUS: 1.8 mg/dL — AB (ref 2.5–4.6)
Phosphorus: 2.3 mg/dL — ABNORMAL LOW (ref 2.5–4.6)

## 2015-03-02 LAB — MAGNESIUM: MAGNESIUM: 1.7 mg/dL (ref 1.7–2.4)

## 2015-03-02 LAB — PROCALCITONIN: Procalcitonin: 6.45 ng/mL

## 2015-03-02 LAB — POTASSIUM: POTASSIUM: 3.5 mmol/L (ref 3.5–5.1)

## 2015-03-02 MED ORDER — MORPHINE SULFATE (PF) 2 MG/ML IV SOLN
1.0000 mg | INTRAVENOUS | Status: DC | PRN
  Administered 2015-03-03: 2 mg via INTRAVENOUS
  Administered 2015-03-03: 1 mg via INTRAVENOUS
  Filled 2015-03-02 (×2): qty 1

## 2015-03-02 MED ORDER — IPRATROPIUM BROMIDE 0.02 % IN SOLN
0.5000 mg | RESPIRATORY_TRACT | Status: DC
  Administered 2015-03-02 – 2015-03-04 (×11): 0.5 mg via RESPIRATORY_TRACT
  Filled 2015-03-02 (×13): qty 2.5

## 2015-03-02 MED ORDER — ACETYLCYSTEINE 20 % IN SOLN
4.0000 mL | RESPIRATORY_TRACT | Status: AC
  Administered 2015-03-02 – 2015-03-03 (×3): 4 mL via RESPIRATORY_TRACT
  Filled 2015-03-02 (×4): qty 4

## 2015-03-02 MED ORDER — FUROSEMIDE 10 MG/ML IJ SOLN
20.0000 mg | Freq: Once | INTRAMUSCULAR | Status: AC
  Administered 2015-03-02: 20 mg via INTRAVENOUS

## 2015-03-02 MED ORDER — LEVALBUTEROL HCL 0.63 MG/3ML IN NEBU
0.6300 mg | INHALATION_SOLUTION | RESPIRATORY_TRACT | Status: DC | PRN
  Administered 2015-03-02 – 2015-03-08 (×10): 0.63 mg via RESPIRATORY_TRACT
  Filled 2015-03-02 (×10): qty 3

## 2015-03-02 MED ORDER — FUROSEMIDE 10 MG/ML IJ SOLN
INTRAMUSCULAR | Status: AC
  Filled 2015-03-02: qty 2

## 2015-03-02 MED ORDER — SODIUM PHOSPHATE 3 MMOLE/ML IV SOLN
10.0000 mmol | Freq: Once | INTRAVENOUS | Status: AC
  Administered 2015-03-02: 10 mmol via INTRAVENOUS
  Filled 2015-03-02: qty 3.33

## 2015-03-02 MED ORDER — BUDESONIDE 0.5 MG/2ML IN SUSP
0.5000 mg | Freq: Two times a day (BID) | RESPIRATORY_TRACT | Status: DC
  Administered 2015-03-02 – 2015-03-12 (×17): 0.5 mg via RESPIRATORY_TRACT
  Filled 2015-03-02 (×19): qty 2

## 2015-03-02 MED ORDER — POTASSIUM CHLORIDE CRYS ER 20 MEQ PO TBCR
40.0000 meq | EXTENDED_RELEASE_TABLET | Freq: Two times a day (BID) | ORAL | Status: AC
  Administered 2015-03-02 – 2015-03-03 (×3): 40 meq via ORAL
  Filled 2015-03-02 (×3): qty 2

## 2015-03-02 MED FILL — Hydromorphone HCl Inj 2 MG/ML: INTRAMUSCULAR | Qty: 1 | Status: AC

## 2015-03-02 NOTE — Progress Notes (Signed)
Patient removed from [place], per her request, after ABG resulted.  Patient placed on 2lpm with SpO2 and RR stable.  PT expectorating yellow sputum.  RT will continue to follow.

## 2015-03-02 NOTE — Progress Notes (Addendum)
[place] TEAM 1 - Stepdown/ICU TEAM PROGRESS NOTE  Ellen Jordan DOB: 02/25/1945 DOA: 02/28/2015 PCP: Robert Bellow, MD  Admit HPI / Brief Narrative: 70 y.o. female [name], HTN, [name] who presented with 3 weeks of worsening dyspnea on exertion w/ a productive cough and  fever.   HPI/Subjective: The pt's heart rate has improved w/ ongoing volume resuscitation, and rate controlling medications.  Her work-of-breathing has increased today.  She can talk to me, and her sats are 90% or better even w/ O2 off, but she tells me she feels she is "getting tired."  She denies CP, N/V, or abdom pain.    Assessment/Plan:  Bibasilar Influenza B PNA w/ acute hypoxic respiratory failure  Cont tamiflu - slow hydration - cont empiric coverage for possible bacterial PNA superinfection - given increased WOB I have asked PCCM to see her   New onset Afib w/ RVR Rate now better controlled at 90-110 w/ only occasional spikes to 120 during my visit - appears better hydrated now - TSH normal - replace phos which is very low - suspect being driven by acute illness/PNA, and until these issues are addressed I do not think Cards would have additional suggestions - I have discussed w/ the pt (and her daughter) that she may require eventual Cards eval for rhythm restoring measures should her afib persist after the treatable driving factors are addressed   Elevated troponin Peaked at 0.3 - explained by her RVR   Mod Hypophosphatemia  Replace via IV due to resp distress making oral intake challenging   Hypokalemia  Replace and follow - Mg 1.7  HTN BP currently controlled  DM2 CBG reasonably controlled   [name]  Code Status: FULL Family Communication: spoke w/ daughter at bedside  Disposition Plan: SDU  Consultants: PCCM  Procedures: none  Antibiotics: Aztreonam 2/18 > 2/19 Vanc 2/18 > 2/19 Levaquin 2/18 >  Tamiflu 2/18 >  DVT prophylaxis: Full dose lovenox    Objective: Blood pressure 117/64, pulse 102, temperature 97.8 F (36.6 C), temperature source Oral, resp. rate 27, height _0  (1.6 m), weight 78.382 kg (172 lb 12.8 oz), SpO2 96 %.  Intake/Output Summary (Last 24 hours) at 03/02/15 1453 Last data filed at 03/02/15 0600  Gross per 24 hour  Intake   2820 ml  Output    350 ml  Net   2470 ml   Exam: General: No severe acute respiratory distress at this time but her WOB is increased  Lungs: bibasilar crackles - no wheeze - some accessory muscle use  Cardiovascular: Irregularly irregular - rate 100 - no appreciable murmur Abdomen: Nontender, nondistended, soft, bowel sounds positive, no rebound, no ascites, no appreciable mass Extremities: No significant cyanosis, clubbing, or edema bilateral lower extremities  Data Reviewed:  Basic Metabolic Panel:  Recent Labs Lab 02/28/15 1418 02/28/15 1421 03/02/15 0345  NA 130* 132* 137  K 3.5 3.6 3.4*  CL 97* 98* 110  CO2 19*  --  18*  GLUCOSE 200* 198* 149*  BUN 21* 20 16  CREATININE 0.93 0.90 0.89  CALCIUM 8.9  --  8.3*  MG  --   --  1.7  PHOS  --   --  1.8*    CBC:  Recent Labs Lab 02/28/15 1418 02/28/15 1421 03/01/15 0350 03/02/15 0345  WBC 12.7*  --  7.9 8.3  HGB 13.1 13.9 10.1* 9.7*  HCT 38.2 41.0 31.3* 30.1*  MCV 85.3  --  84.8 85.8  PLT 153  --  153 167    Liver Function Tests:  Recent Labs Lab 02/28/15 1418 03/02/15 0345  AST 39 33  ALT 32 29  ALKPHOS 56 45  BILITOT 1.3* 0.6  PROT 7.7 5.8*  ALBUMIN 3.7 2.5*   Cardiac Enzymes:  Recent Labs Lab 02/28/15 1418 03/01/15 0350 03/01/15 0351 03/01/15 0914  TROPONINI 0.04* 0.11* 0.30* 0.08*    CBG:  Recent Labs Lab 03/01/15 0733 03/01/15 1117 03/01/15 1538 03/01/15 2131 03/02/15 0742  GLUCAP 164* 127* 114* 166* 155*    Recent Results (from the past 240 hour(s))  Blood culture (routine x 2)     Status: None (Preliminary result)   Collection Time: 02/28/15  2:38 PM  Result Value Ref  Range Status   Specimen Description BLOOD RIGHT HAND  Final   Special Requests BOTTLES DRAWN AEROBIC AND ANAEROBIC 6CC EACH  Final   Culture NO GROWTH 2 DAYS  Final   Report Status PENDING  Incomplete  Blood culture (routine x 2)     Status: None (Preliminary result)   Collection Time: 02/28/15  2:44 PM  Result Value Ref Range Status   Specimen Description LEFT ANTECUBITAL  Final   Special Requests BOTTLES DRAWN AEROBIC AND ANAEROBIC [place]  Final   Culture NO GROWTH 2 DAYS  Final   Report Status PENDING  Incomplete  MRSA PCR Screening     Status: None   Collection Time: 02/28/15  9:23 PM  Result Value Ref Range Status   MRSA by PCR NEGATIVE NEGATIVE Final    Comment:        The GeneXpert MRSA Assay (FDA approved for NASAL specimens only), is one component of a comprehensive MRSA colonization surveillance program. It is not intended to diagnose MRSA infection nor to guide or monitor treatment for MRSA infections.   MRSA PCR Screening     Status: None   Collection Time: 03/02/15 12:29 PM  Result Value Ref Range Status   MRSA by PCR NEGATIVE NEGATIVE Final    Comment:        The GeneXpert MRSA Assay (FDA approved for NASAL specimens only), is one component of a comprehensive MRSA colonization surveillance program. It is not intended to diagnose MRSA infection nor to guide or monitor treatment for MRSA infections.      Studies:   Recent x-ray studies have been reviewed in detail by the Attending Physician  Scheduled Meds:  Scheduled Meds: . ALPRAZolam  0.5 mg Oral TID  . aspirin EC  81 mg Oral Daily  . enoxaparin (LOVENOX) injection  1 mg/kg Subcutaneous Q12H  . insulin aspart  0-15 Units Subcutaneous TID WC  . insulin aspart  0-5 Units Subcutaneous QHS  . insulin aspart  8 Units Subcutaneous TID AC  . insulin detemir  40 Units Subcutaneous QHS  . levofloxacin  750 mg Oral Daily  . metoprolol  10 mg Intravenous 4 times per day  . oseltamivir  30 mg Oral BID   . sertraline  100 mg Oral Daily  . simvastatin  40 mg Oral Daily    Time spent on care of this patient: 35 mins   MCCLUNG,JEFFREY T , MD   [facility] Office  [phone] Pager - Text Page per Shea Evans as per below:  On-Call/Text Page:      [email or web address]      password [email or web address]  If 7PM-7AM, please contact night-coverage [email or web address] Password [email or web address] 03/02/2015, 2:53 PM   LOS: 2 days

## 2015-03-02 NOTE — Progress Notes (Signed)
[facility] Progress Note Patient Name: Ellen Jordan DOB: 03/09/1945 MRN: [id number]   Date of Service  03/02/2015  HPI/Events of Note  ABG = 7.25/39.9/136.0 which is consistent with a metabolic acidosis. Video assessment: Looks comfortable on [place] O2.  eICU Interventions  Continue present management.     Intervention Category Intermediate Interventions: Respiratory distress - evaluation and management  [name] 03/02/2015, 9:14 PM

## 2015-03-02 NOTE — Consult Note (Signed)
Name: Ellen Jordan MRN: [id number] DOB: [birth year]    ADMISSION DATE:  02/28/2015 CONSULTATION DATE:  02/28/15  REFERRING MD :  TRH  CHIEF COMPLAINT:  SOB   HISTORY OF PRESENT ILLNESS:  Ellen Jordan is a 70 y.o. female with a PMH as outlined below.  She presented to [facility] ED 02/28/15 with 3 weeks of worsening dyspnea.  Symptoms exacerbated by exertion and relieved by rest.  Associated with productive cough and mild fever over the 3 - 4 days prior.  She had also felt her heart racing for 2 days.  Did not experience any chills/sweats, headache, chest pain, N/V/D, abd pain, myalgias.  No exposures to known sick contacts and no recent travel.  In ED, she was found to have A.fib with RVR, acute hypoxic respiratory failure, mild lactic acidosis, and concern for CAP vs flu.  She was subsequently admitted for further evaluation and management.  Flu PCR returned positive for influenza B for which she was placed on tamiflu.  She was also placed on empiric abx (levaquin) for concern of superimposed bacterial infection.  She was placed on cardizem gtt for her AFRVR to which she responded well.  On PM of 03/02/15, she had persistent symptoms and despite maintaining adequate O2 levels on 2L [place], she informed TRH MD that she felt as if she were tiring out.  PCCM was consulted for concern that pt would deteriorate and require either NIMV vs intubation.  PAST MEDICAL HISTORY :   has a past medical history of Hypertension; Diabetes mellitus without complication ([place]); Hypercholesteremia; and Cancer ([place]).  has past surgical history that includes Abdominal hysterectomy; Back surgery; and Cholecystectomy. Prior to Admission medications   Medication Sig Start Date End Date Taking? Authorizing Provider  ALPRAZolam [name]) 0.5 MG tablet Take 0.5 mg by mouth 3 (three) times daily.    Yes Historical Provider, MD  Ascorbic Acid (VITAMIN C GUMMIE PO) Take 2 each by mouth daily.   Yes Historical Provider, MD  aspirin  EC 81 MG tablet Take 81 mg by mouth daily.   Yes Historical Provider, MD  atenolol (TENORMIN) 50 MG tablet Take 50 mg by mouth daily.   Yes Historical Provider, MD  b complex vitamins tablet Take 1 tablet by mouth daily.   Yes Historical Provider, MD  Biotin 5000 MCG CAPS Take 1 capsule by mouth daily.   Yes Historical Provider, MD  Calcium-Phosphorus-Vitamin D (CALCIUM GUMMIES PO) Take 500-1,000 mg by mouth daily.   Yes Historical Provider, MD  Cholecalciferol (VITAMIN D) 2000 units CAPS Take 1 capsule by mouth daily.   Yes Historical Provider, MD  Cinnamon 500 MG TABS Take 1,000 mg by mouth daily.    Yes Historical Provider, MD  diphenhydrAMINE (BENADRYL) 25 MG tablet Take 25 mg by mouth daily.   Yes Historical Provider, MD  Garcinia Cambogia-Chromium 500-200 MG-MCG TABS Take 1 tablet by mouth daily.   Yes Historical Provider, MD  insulin aspart (NOVOLOG) 100 UNIT/ML injection Inject 15 Units into the skin 3 (three) times daily before meals. Sliding scale included as directed per MD   Yes Historical Provider, MD  insulin detemir (LEVEMIR) 100 UNIT/ML injection Inject 60 Units into the skin at bedtime.   Yes Historical Provider, MD  INVOKANA 100 MG TABS tablet Take 100 mg by mouth daily. 02/26/15  Yes Historical Provider, MD  Liraglutide (VICTOZA) 18 MG/3ML SOPN Inject 1.2 mg into the skin daily.    Yes Historical Provider, MD  lisinopril (PRINIVIL,ZESTRIL) 20 MG tablet  Take 20 mg by mouth daily.   Yes Historical Provider, MD  Misc Natural Products (ESTROVEN + ENERGY MAX STRENGTH) TABS Take 1 tablet by mouth daily.   Yes Historical Provider, MD  Omega-3 Fatty Acids (FISH OIL) 1200 MG CAPS Take 1 capsule by mouth daily.   Yes Historical Provider, MD  sertraline (ZOLOFT) 100 MG tablet Take 100 mg by mouth daily.   Yes Historical Provider, MD  simvastatin (ZOCOR) 40 MG tablet Take 40 mg by mouth daily.   Yes Historical Provider, MD  vitamin E (VITAMIN E) 400 UNIT capsule Take 400 Units by mouth daily.    Yes Historical Provider, MD   Allergies  Allergen Reactions  . Penicillins Hives    Has patient had a PCN reaction causing immediate rash, facial/tongue/throat swelling, SOB or lightheadedness with hypotension: Yes Has patient had a PCN reaction causing severe rash involving mucus membranes or skin necrosis: No Has patient had a PCN reaction that required hospitalization No Has patient had a PCN reaction occurring within the last 10 years: No If all of the above answers are "NO", then may proceed with Cephalosporin use.     FAMILY HISTORY:  family history includes Cancer in her mother; Diabetes in her brother. SOCIAL HISTORY:  reports that she quit smoking about 10 years ago. Her smoking use included Cigarettes. She quit after 40 years of use. She has never used smokeless tobacco. She reports that she does not drink alcohol or use illicit drugs.  REVIEW OF SYSTEMS:   All negative; except for those that are bolded, which indicate positives.  Constitutional: weight loss, weight gain, night sweats, fevers, chills, fatigue, weakness.  HEENT: headaches, sore throat, sneezing, nasal congestion, post nasal drip, difficulty swallowing, tooth/dental problems, visual complaints, visual changes, ear aches. Neuro: difficulty with speech, weakness, numbness, ataxia. CV:  chest pain, orthopnea, PND, swelling in lower extremities, dizziness, palpitations, syncope.  Resp: productive cough, hemoptysis, dyspnea, wheezing. GI  heartburn, indigestion, abdominal pain, nausea, vomiting, diarrhea, constipation, change in bowel habits, loss of appetite, hematemesis, melena, hematochezia.  GU: dysuria, change in color of urine, urgency or frequency, flank pain, hematuria. MSK: joint pain or swelling, decreased range of motion. Psych: change in mood or affect, depression, anxiety, suicidal ideations, homicidal ideations. Skin: rash, itching, bruising.    SUBJECTIVE:  Feels fatigued.  Denies chest pain.   Continues to have cough and having trouble coughing anything up.  Feels as if she needs to expell a lot of phlegm.  VITAL SIGNS: Temp:  [97.6 F (36.4 C)-97.8 F (36.6 C)] 97.8 F (36.6 C) (02/20 1156) Pulse Rate:  [30-153] 102 (02/20 1156) Resp:  [25-35] 27 (02/20 1156) BP: (90-139)/(51-115) 117/64 mmHg (02/20 1156) SpO2:  [91 %-96 %] 96 % (02/20 1156)  PHYSICAL EXAMINATION: General: Elderly appearing female, sitting in recliner, appears uncomfortable trying to cough up sputum. Neuro: A&O x 3, non-focal.  HEENT: [place]/AT. PERRL, sclerae anicteric. Cardiovascular: IRIR, tachy, no M/R/G.  Lungs: Respirations shallow. Bilateral basilar crackles. Abdomen: BS x 4, soft, NT/ND.  Musculoskeletal: No gross deformities, no edema.  Skin: Intact, warm, no rashes.     Recent Labs Lab 02/28/15 1418 02/28/15 1421 03/02/15 0345  NA 130* 132* 137  K 3.5 3.6 3.4*  CL 97* 98* 110  CO2 19*  --  18*  BUN 21* 20 16  CREATININE 0.93 0.90 0.89  GLUCOSE 200* 198* 149*    Recent Labs Lab 02/28/15 1418 02/28/15 1421 03/01/15 0350 03/02/15 0345  HGB 13.1 13.9 10.1*  9.7*  HCT 38.2 41.0 31.3* 30.1*  WBC 12.7*  --  7.9 8.3  PLT 153  --  153 167   Dg Chest Port 1 View  03/01/2015  CLINICAL DATA:  Dyspnea.  Subsequent encounter. EXAM: PORTABLE CHEST 1 VIEW COMPARISON:  02/28/2015 FINDINGS: Patchy airspace opacity in the left perihilar region and in both lung bases is similar to the prior study allowing for differences in patient positioning and inspiratory volume. No new lung abnormalities. No pneumothorax. IMPRESSION: 1. No significant change from the previous day's study. Bilateral lower lung zone and left perihilar airspace opacities consistent with multifocal pneumonia. Electronically Signed   By: Lajean Manes M.D.   On: 03/01/2015 07:30    STUDIES:  CXR 2/18 > PNA in left mid lung. R basilar atx. CXR 2/20 > minimal change, mild vascular congestion (my read).  SIGNIFICANT EVENTS  02/18  > admitted with sepsis and A.fib presumed due to CAP and influenza. 02/20 > PCCM consulted for increased WOB.  ASSESSMENT / PLAN:  Acute hypoxic respiratory failure. Influenza B - confirmed by flu PCR. Concern for underlying CAP / bacterial PNA. Mild pulmonary edema. Plan: Continue supplemental O2 as needed to maintain SpO2 > 92%. Will order BiPAP PRN - encouraged pt to use with naps and QHS and also if she feels fatigued. Continue tamiflu and levaquin. Assess PCT. Continue robitussin. Add mucomyst nebs x 4 doses. Add flutter valve and push pulmonary hygiene. Add low dose morphine PRN (norco d/c'd). Lasix 51m x 1. Follow CXR.  A.fib with RVR - currently on cardizem gtt. Troponin leak - presumed demand due to above. Plan: Continue to monitor.  Rest per primary team.  CC time:  30 minutes.   RMontey Hora PFourchePulmonary & Critical Care Medicine Pager: [phone] or ([phone]/20/2017, 4:02 PM

## 2015-03-03 ENCOUNTER — Inpatient Hospital Stay (HOSPITAL_COMMUNITY): Payer: Commercial Managed Care - HMO

## 2015-03-03 DIAGNOSIS — E860 Dehydration: Secondary | ICD-10-CM

## 2015-03-03 DIAGNOSIS — J1100 Influenza due to unidentified influenza virus with unspecified type of pneumonia: Secondary | ICD-10-CM

## 2015-03-03 LAB — COMPREHENSIVE METABOLIC PANEL
ALT: 39 U/L (ref 14–54)
AST: 46 U/L — ABNORMAL HIGH (ref 15–41)
Albumin: 2.3 g/dL — ABNORMAL LOW (ref 3.5–5.0)
Alkaline Phosphatase: 66 U/L (ref 38–126)
Anion gap: 6 (ref 5–15)
BUN: 16 mg/dL (ref 6–20)
CHLORIDE: 113 mmol/L — AB (ref 101–111)
CO2: 18 mmol/L — ABNORMAL LOW (ref 22–32)
Calcium: 8.3 mg/dL — ABNORMAL LOW (ref 8.9–10.3)
Creatinine, Ser: 0.82 mg/dL (ref 0.44–1.00)
Glucose, Bld: 187 mg/dL — ABNORMAL HIGH (ref 65–99)
POTASSIUM: 4 mmol/L (ref 3.5–5.1)
Sodium: 137 mmol/L (ref 135–145)
TOTAL PROTEIN: 6.1 g/dL — AB (ref 6.5–8.1)
Total Bilirubin: 0.5 mg/dL (ref 0.3–1.2)

## 2015-03-03 LAB — CBC
HCT: 30.1 % — ABNORMAL LOW (ref 36.0–46.0)
HEMOGLOBIN: 9.4 g/dL — AB (ref 12.0–15.0)
MCH: 27.5 pg (ref 26.0–34.0)
MCHC: 31.2 g/dL (ref 30.0–36.0)
MCV: 88 fL (ref 78.0–100.0)
Platelets: 183 10*3/uL (ref 150–400)
RBC: 3.42 MIL/uL — ABNORMAL LOW (ref 3.87–5.11)
RDW: 14.8 % (ref 11.5–15.5)
WBC: 8.3 10*3/uL (ref 4.0–10.5)

## 2015-03-03 LAB — GLUCOSE, CAPILLARY
GLUCOSE-CAPILLARY: 125 mg/dL — AB (ref 65–99)
GLUCOSE-CAPILLARY: 162 mg/dL — AB (ref 65–99)
Glucose-Capillary: 179 mg/dL — ABNORMAL HIGH (ref 65–99)
Glucose-Capillary: 204 mg/dL — ABNORMAL HIGH (ref 65–99)
Glucose-Capillary: 224 mg/dL — ABNORMAL HIGH (ref 65–99)

## 2015-03-03 LAB — PHOSPHORUS: PHOSPHORUS: 2.1 mg/dL — AB (ref 2.5–4.6)

## 2015-03-03 MED ORDER — FUROSEMIDE 10 MG/ML IJ SOLN
20.0000 mg | Freq: Once | INTRAMUSCULAR | Status: AC
  Administered 2015-03-03: 20 mg via INTRAVENOUS
  Filled 2015-03-03: qty 2

## 2015-03-03 MED ORDER — METHYLPREDNISOLONE SODIUM SUCC 125 MG IJ SOLR
60.0000 mg | INTRAMUSCULAR | Status: DC
  Administered 2015-03-03 – 2015-03-04 (×2): 60 mg via INTRAVENOUS
  Filled 2015-03-03 (×2): qty 2

## 2015-03-03 MED ORDER — DM-GUAIFENESIN ER 30-600 MG PO TB12
1.0000 | ORAL_TABLET | Freq: Two times a day (BID) | ORAL | Status: DC
  Administered 2015-03-03 – 2015-03-12 (×17): 1 via ORAL
  Filled 2015-03-03 (×18): qty 1

## 2015-03-03 MED ORDER — INSULIN ASPART 100 UNIT/ML ~~LOC~~ SOLN: 0.0000 [IU] | SUBCUTANEOUS | Status: DC

## 2015-03-03 MED ORDER — POTASSIUM PHOSPHATES 15 MMOLE/5ML IV SOLN
10.0000 mmol | Freq: Once | INTRAVENOUS | Status: AC
  Administered 2015-03-03: 10 mmol via INTRAVENOUS
  Filled 2015-03-03: qty 3.33

## 2015-03-03 MED ORDER — SODIUM CHLORIDE 0.9 % IV SOLN
INTRAVENOUS | Status: DC
  Administered 2015-03-04 – 2015-03-06 (×2): via INTRAVENOUS

## 2015-03-03 MED ORDER — SODIUM PHOSPHATE 3 MMOLE/ML IV SOLN
10.0000 mmol | Freq: Once | INTRAVENOUS | Status: AC
  Administered 2015-03-03: 10 mmol via INTRAVENOUS
  Filled 2015-03-03: qty 3.33

## 2015-03-03 MED ORDER — MAGNESIUM SULFATE 2 GM/50ML IV SOLN
2.0000 g | Freq: Once | INTRAVENOUS | Status: AC
  Administered 2015-03-03: 2 g via INTRAVENOUS
  Filled 2015-03-03: qty 50

## 2015-03-03 NOTE — Progress Notes (Signed)
[place] TEAM 1 - Stepdown/ICU TEAM Progress Note  Ellen Jordan EEF:[id number] DOB: May 06, 1945 DOA: 02/28/2015 PCP: Robert Bellow, MD  Admit HPI / Brief Narrative: 70 y.o. WF PMHx DM Type 2, HTN, [name]   Presented with 3 weeks of worsening dyspnea on exertion w/ a productive cough and fever  HPI/Subjective: 2/21 A/O 4, patient having painful respiration secondary to coughing paroxysms. Difficulty clearing phlegm. Not on home O2  Assessment/Plan: Sepsis pneumonia/Bibasilar positiveInfluenza B PNA w/ acute hypoxic respiratory failure  -Cont Tamiflu and Levaquin - cont empiric coverage for possible bacterial PNA superinfection - given increased WOB I have asked PCCM to see her  -Physiotherapy vest BID -Solu-Medrol 60 mg daily -Mucinex DM BID -Continue flutter valve -Normal saline 18m/hr -BiPAP PRN increased WOB/PCO2   New onset Afib w/ RVR -Rate now better controlled at 90-110 w/ only occasional spikes to 120 during my visit  - appears dehydrated; tongue and lips dry and cracked  - TSH normal  -Strict in and out -Daily a.m. weight -Echocardiogram pending  Elevated troponin -Peaked at 0.3 - explained by her RVR   Mod Hypophosphatemia  -Sodium phosphate 10 mmol    Hypokalemia  -Potassium goal > 4  -Replace and follow - Mg 1.7  HTN -BP currently controlled  DM2 -CBG reasonably controlled  -Continue Levemir 40 units QHS -Continue NovoLog 8 units QAC -Increase to resistant SSI  [name]   Code Status: FULL Family Communication: Daughter present at time of exam Disposition Plan: Resolution sepsis    Consultants: [facility]  Procedure/Significant Events: Echocardiogram pending   Culture 2/18 blood right hand/left AC NGTD 2/18 MRSA by PCR negative 2/18 positive influenza B 2/18 respiratory virus panel pending 2/19 HIV negative   Antibiotics: Aztreonam 2/18 > 2/19 Vanc 2/18 > 2/19 Levaquin 2/18 >  Tamiflu 2/18 >  DVT  prophylaxis: Full dose Lovenox   Devices    LINES / TUBES:      Continuous Infusions: . sodium chloride 75 mL/hr at 03/03/15 1900  . diltiazem (CARDIZEM) infusion 15 mg/hr (03/03/15 1455)    Objective: VITAL SIGNS: Temp: 98.1 F (36.7 C) (02/21 1628) Temp Source: Axillary (02/21 1628) BP: 120/65 mmHg (02/21 1800) Pulse Rate: 78 (02/21 1800) SPO2; FIO2:   Intake/Output Summary (Last 24 hours) at 03/03/15 1943 Last data filed at 03/03/15 1900  Gross per 24 hour  Intake   2180 ml  Output   2090 ml  Net     90 ml     Exam: General: A/O 4, patient having painful respiration secondary to coughing paroxysms. Positive acute respiratory distress Eyes: Negative headache, negative scleral hemorrhage ENT: Negative Runny nose, negative gingival bleeding, Neck:  Negative scars, masses, torticollis, lymphadenopathy, JVD Lungs: diffuse rhonchi, positive expiratory wheezes, negative crackles  Cardiovascular: Tachycardic, Regular rhythm without murmur gallop or rub normal S1 and S2 Abdomen:negative abdominal pain, nondistended, positive soft, bowel sounds, no rebound, no ascites, no appreciable mass Extremities: No significant cyanosis, clubbing, or edema bilateral lower extremities Psychiatric:  Negative depression, negative anxiety, negative fatigue, negative mania  Neurologic:  Cranial nerves II through XII intact, tongue/uvula midline, all extremities muscle strength 5/5, sensation intact throughout,  negative dysarthria, negative expressive aphasia, negative receptive aphasia.   Data Reviewed: Basic Metabolic Panel:  Recent Labs Lab 02/28/15 1418 02/28/15 1421 03/02/15 0345 03/02/15 1710 03/02/15 1749 03/02/15 2133 03/03/15 0215  NA 130* 132* 137  --  135  --  137  K 3.5 3.6 3.4* 3.5 3.6  --  4.0  CL 97* 98* 110  --  110  --  113*  CO2 19*  --  18*  --  17*  --  18*  GLUCOSE 200* 198* 149*  --  131*  --  187*  BUN 21* 20 16  --  17  --  16  CREATININE 0.93 0.90  0.89  --  0.85  --  0.82  CALCIUM 8.9  --  8.3*  --  7.9*  --  8.3*  MG  --   --  1.7  --   --   --   --   PHOS  --   --  1.8*  --   --  2.3* 2.1*   Liver Function Tests:  Recent Labs Lab 02/28/15 1418 03/02/15 0345 03/03/15 0215  AST 39 33 46*  ALT 32 29 39  ALKPHOS 56 45 66  BILITOT 1.3* 0.6 0.5  PROT 7.7 5.8* 6.1*  ALBUMIN 3.7 2.5* 2.3*   No results for input(s): LIPASE, AMYLASE in the last 168 hours. No results for input(s): AMMONIA in the last 168 hours. CBC:  Recent Labs Lab 02/28/15 1418 02/28/15 1421 03/01/15 0350 03/02/15 0345 03/03/15 0215  WBC 12.7*  --  7.9 8.3 8.3  HGB 13.1 13.9 10.1* 9.7* 9.4*  HCT 38.2 41.0 31.3* 30.1* 30.1*  MCV 85.3  --  84.8 85.8 88.0  PLT 153  --  153 167 183   Cardiac Enzymes:  Recent Labs Lab 02/28/15 1418 03/01/15 0350 03/01/15 0351 03/01/15 0914  TROPONINI 0.04* 0.11* 0.30* 0.08*   BNP (last 3 results)  Recent Labs  02/28/15 1418 03/01/15 0351  BNP 375.0* 408.7*    ProBNP (last 3 results) No results for input(s): PROBNP in the last 8760 hours.  CBG:  Recent Labs Lab 03/02/15 1645 03/02/15 2142 03/03/15 0736 03/03/15 1134 03/03/15 1712  GLUCAP 142* 125* 179* 162* 204*    Recent Results (from the past 240 hour(s))  Blood culture (routine x 2)     Status: None (Preliminary result)   Collection Time: 02/28/15  2:38 PM  Result Value Ref Range Status   Specimen Description BLOOD RIGHT HAND  Final   Special Requests BOTTLES DRAWN AEROBIC AND ANAEROBIC 6CC EACH  Final   Culture NO GROWTH 3 DAYS  Final   Report Status PENDING  Incomplete  Blood culture (routine x 2)     Status: None (Preliminary result)   Collection Time: 02/28/15  2:44 PM  Result Value Ref Range Status   Specimen Description BLOOD LEFT ANTECUBITAL  Final   Special Requests BOTTLES DRAWN AEROBIC AND ANAEROBIC 6CC EACH  Final   Culture NO GROWTH 3 DAYS  Final   Report Status PENDING  Incomplete  MRSA PCR Screening     Status: None    Collection Time: 02/28/15  9:23 PM  Result Value Ref Range Status   MRSA by PCR NEGATIVE NEGATIVE Final    Comment:        The GeneXpert MRSA Assay (FDA approved for NASAL specimens only), is one component of a comprehensive MRSA colonization surveillance program. It is not intended to diagnose MRSA infection nor to guide or monitor treatment for MRSA infections.   MRSA PCR Screening     Status: None   Collection Time: 03/02/15 12:29 PM  Result Value Ref Range Status   MRSA by PCR NEGATIVE NEGATIVE Final    Comment:        The GeneXpert MRSA Assay (FDA approved for NASAL  specimens only), is one component of a comprehensive MRSA colonization surveillance program. It is not intended to diagnose MRSA infection nor to guide or monitor treatment for MRSA infections.      Studies:  Recent x-ray studies have been reviewed in detail by the Attending Physician  Scheduled Meds:  Scheduled Meds: . ALPRAZolam  0.5 mg Oral TID  . aspirin EC  81 mg Oral Daily  . budesonide (PULMICORT) nebulizer solution  0.5 mg Nebulization BID  . dextromethorphan-guaiFENesin  1 tablet Oral BID  . enoxaparin (LOVENOX) injection  1 mg/kg Subcutaneous Q12H  . insulin aspart  0-20 Units Subcutaneous 6 times per day  . insulin aspart  0-5 Units Subcutaneous QHS  . insulin aspart  8 Units Subcutaneous TID AC  . insulin detemir  40 Units Subcutaneous QHS  . ipratropium  0.5 mg Nebulization Q4H  . levofloxacin  750 mg Oral Daily  . methylPREDNISolone (SOLU-MEDROL) injection  60 mg Intravenous Q24H  . metoprolol  10 mg Intravenous 4 times per day  . oseltamivir  30 mg Oral BID  . sertraline  100 mg Oral Daily  . simvastatin  40 mg Oral Daily    Time spent on care of this patient: 40 mins   Melora Menon, Geraldo Docker , MD  [facility] Office  [phone] Pager [phone]  On-Call/Text Page:      [email or web address]      password [email or web address]  If 7PM-7AM, please contact  night-coverage [email or web address] Password [facility] 03/03/2015, 7:43 PM   LOS: 3 days   Care during the described time interval was provided by me .  I have reviewed this patient's available data, including medical history, events of note, physical examination, and all test results as part of my evaluation. I have personally reviewed and interpreted all radiology studies.   Dia Crawford, MD [phone] Pager

## 2015-03-03 NOTE — Consult Note (Addendum)
Name: Ellen Jordan MRN: [id number] DOB: [birth year]    ADMISSION DATE:  02/28/2015 CONSULTATION DATE:  02/28/15  REFERRING MD :  TRH  CHIEF COMPLAINT:  SOB  BRIEF:  Ellen Jordan is a 70 y.o. female with a PMH as outlined below.  She presented to [facility] ED 02/28/15 with 3 weeks of worsening dyspnea.  Symptoms exacerbated by exertion and relieved by rest.  Associated with productive cough and mild fever over the 3 - 4 days prior.  She had also felt her heart racing for 2 days.  Did not experience any chills/sweats, headache, chest pain, N/V/D, abd pain, myalgias.  No exposures to known sick contacts and no recent travel.  In ED, she was found to have A.fib with RVR, acute hypoxic respiratory failure, mild lactic acidosis, and concern for CAP vs flu.  She was subsequently admitted for further evaluation and management.  Flu PCR returned positive for influenza B for which she was placed on tamiflu.  She was also placed on empiric abx (levaquin) for concern of superimposed bacterial infection.  She was placed on cardizem gtt for her AFRVR to which she responded well.  On PM of 03/02/15, she had persistent symptoms and despite maintaining adequate O2 levels on 2L [place], she informed TRH MD that she felt as if she were tiring out.  PCCM was consulted for concern that pt would deteriorate and require either NIMV vs intubation.     STUDIES:  CXR 2/18 > PNA in left mid lung. R basilar atx. CXR 2/20 > minimal change, mild vascular congestion (my read).  SIGNIFICANT EVENTS  02/18 > admitted with sepsis and A.fib presumed due to CAP and influenza. 02/20 > PCCM consulted for increased WOB. :  Feels fatigued.  Denies chest pain.  Continues to have cough and having trouble coughing anything up.  Feels as if she needs to expell a lot of phlegm.   SUBJECTIVE/OVERNIGHT/INTERVAL HX 03/03/15 - down to [facility] and much improved after lasix x 1 and brief bipap./ She does not want bipap anymore. Daugher and RN feel  she is better  VITAL SIGNS: Temp:  [97.6 F (36.4 C)-97.9 F (36.6 C)] 97.6 F (36.4 C) (02/21 0728) Pulse Rate:  [26-120] 101 (02/21 0900) Resp:  [22-27] 26 (02/21 1006) BP: (104-136)/(58-81) 114/74 mmHg (02/21 0900) SpO2:  [95 %-100 %] 98 % (02/21 1006) FiO2 (%):  [40 %] 40 % (02/21 0822) Weight:  [85.276 kg (188 lb)] 85.276 kg (188 lb) (02/21 0600)  PHYSICAL EXAMINATION: General: Elderly appearing female, sitting in recliner, appears comfortable and better. DECODNITIONED Neuro: A&O x 3, non-focal.  HEENT: [place]/AT. PERRL, sclerae anicteric. Cardiovascular: IRIR, tachy, no M/R/G.  Lungs: Respirations shallow but improved. No distress. No wheeze. Bilateral basilar crackles. Abdomen: BS x 4, soft, NT/ND.  Musculoskeletal: No gross deformities, no edema.  Skin: Intact, warm, no rashes.  PULMONARY  Recent Labs Lab 02/28/15 1421 03/02/15 2028  PHART  --  7.253*  PCO2ART  --  39.9  PO2ART  --  136.0*  HCO3  --  17.7*  TCO2 20 19  O2SAT  --  99.0    CBC  Recent Labs Lab 03/01/15 0350 03/02/15 0345 03/03/15 0215  HGB 10.1* 9.7* 9.4*  HCT 31.3* 30.1* 30.1*  WBC 7.9 8.3 8.3  PLT 153 167 183    COAGULATION No results for input(s): INR in the last 168 hours.  CARDIAC   Recent Labs Lab 02/28/15 1418 03/01/15 0350 03/01/15 0351 03/01/15 0914  TROPONINI 0.04*  0.11* 0.30* 0.08*   No results for input(s): PROBNP in the last 168 hours.   CHEMISTRY  Recent Labs Lab 02/28/15 1418 02/28/15 1421 03/02/15 0345  03/02/15 1749 03/02/15 2133 03/03/15 0215  NA 130* 132* 137  --  135  --  137  K 3.5 3.6 3.4*  < > 3.6  --  4.0  CL 97* 98* 110  --  110  --  113*  CO2 19*  --  18*  --  17*  --  18*  GLUCOSE 200* 198* 149*  --  131*  --  187*  BUN 21* 20 16  --  17  --  16  CREATININE 0.93 0.90 0.89  --  0.85  --  0.82  CALCIUM 8.9  --  8.3*  --  7.9*  --  8.3*  MG  --   --  1.7  --   --   --   --   PHOS  --   --  1.8*  --   --  2.3* 2.1*  < > = values in this  interval not displayed. Estimated Creatinine Clearance: 65.2 mL/min (by C-G formula based on Cr of 0.82).   LIVER  Recent Labs Lab 02/28/15 1418 03/02/15 0345 03/03/15 0215  AST 39 33 46*  ALT 32 29 39  ALKPHOS 56 45 66  BILITOT 1.3* 0.6 0.5  PROT 7.7 5.8* 6.1*  ALBUMIN 3.7 2.5* 2.3*     INFECTIOUS  Recent Labs Lab 02/28/15 1421 02/28/15 1749 03/01/15 1156 03/02/15 1710  LATICACIDVEN 2.70* 2.93* 1.3  --   PROCALCITON  --   --   --  6.45     ENDOCRINE CBG (last 3)   Recent Labs  03/02/15 1645 03/02/15 2142 03/03/15 0736  GLUCAP 142* 125* 179*         IMAGING x48h  - image(s) personally visualized  -   highlighted in bold Dg Chest Port 1 View  03/03/2015  CLINICAL DATA:  Respiratory failure EXAM: PORTABLE CHEST 1 VIEW COMPARISON:  03/02/2015 FINDINGS: Bibasilar airspace disease, right greater than left with mild progression. No significant pleural effusion. Negative for pulmonary edema. IMPRESSION: Mild progression of bibasilar infiltrates right greater than left. Electronically Signed   By: Franchot Gallo M.D.   On: 03/03/2015 07:18   Dg Chest Port 1 View  03/02/2015  CLINICAL DATA:  Shortness of breath and productive cough EXAM: PORTABLE CHEST 1 VIEW COMPARISON:  08/29/2015 FINDINGS: Cardiac shadow is stable. Patchy infiltrate is again noted in the left mid and lower lung with increasing infiltrate in the right lung base. No bony abnormality is seen. IMPRESSION: Increasing right basilar infiltrate. Patchy changes are again noted on the left. Electronically Signed   By: Inez Catalina M.D.   On: 03/02/2015 16:12       ASSESSMENT / PLAN:  Acute hypoxic respiratory failure. Influenza B - confirmed by flu PCR. Concern for underlying CAP / bacterial PNA. Mild pulmonary edema.   - clinically improved despite opposite interpretation of radiologist. Does not look like she needs bipap anymore. Down to 2L [place]  Plan: Repeat lasix x 1 Get 2D echo DC  bipap Continue supplemental O2 as needed to maintain SpO2 > 92%. Continue tamiflu and levaquin. Continue robitussin. mucomyst nebs x 4 doses. flutter valve and push pulmonary hygiene. low dose morphine PRN (norco d/c'd) for dyspnea Follow CXR prn  RENAL A Mild hypomag yesterday Mild hypophos today  Plan Replete both Recheck bmet, phos mag 03/04/15  and trh to follow  GI Can eat    Rest per TRH. Daughter updated   PCCM will sign off  Dr. Brand Males, M.D., [facility].C.P Pulmonary and Critical Care Medicine Staff Physician [place] [facility] Pager: [phone], If no answer or between  15:00h - 7:00h: call [phone]  03/03/2015 10:33 AM

## 2015-03-03 NOTE — Care Management Important Message (Signed)
Important Message  Patient Details  Name: Ellen Jordan MRN: [id number] Date of Birth: [birth year]   Medicare Important Message Given:  Yes    Lacretia Leigh, RN 03/03/2015, 10:01 AM

## 2015-03-03 NOTE — Progress Notes (Signed)
RT note- placed back on Bipap.

## 2015-03-03 NOTE — Progress Notes (Signed)
RT note- Bipap removed, patient request, complaining that the mask was hurting the bridge of her nose. Small pressure spot noted. RN notified.

## 2015-03-03 NOTE — Progress Notes (Signed)
Patient exhibits increased respiratory rate (28-32+) and work of breathing on nasal canula.  Patient is arousable, but quickly drifts back to sleep while in room for breathing tx.  RT placed back on BiPAP.  RT will continue to monitor.

## 2015-03-04 ENCOUNTER — Ambulatory Visit (HOSPITAL_COMMUNITY): Payer: Commercial Managed Care - HMO

## 2015-03-04 LAB — CBC
HCT: 30.6 % — ABNORMAL LOW (ref 36.0–46.0)
HEMOGLOBIN: 10.1 g/dL — AB (ref 12.0–15.0)
MCH: 28.9 pg (ref 26.0–34.0)
MCHC: 33 g/dL (ref 30.0–36.0)
MCV: 87.7 fL (ref 78.0–100.0)
PLATELETS: 171 10*3/uL (ref 150–400)
RBC: 3.49 MIL/uL — AB (ref 3.87–5.11)
RDW: 14.9 % (ref 11.5–15.5)
WBC: 6.3 10*3/uL (ref 4.0–10.5)

## 2015-03-04 LAB — COMPREHENSIVE METABOLIC PANEL
ALK PHOS: 58 U/L (ref 38–126)
ALT: 47 U/L (ref 14–54)
ANION GAP: 7 (ref 5–15)
AST: 44 U/L — ABNORMAL HIGH (ref 15–41)
Albumin: 2.3 g/dL — ABNORMAL LOW (ref 3.5–5.0)
BILIRUBIN TOTAL: 0.6 mg/dL (ref 0.3–1.2)
BUN: 14 mg/dL (ref 6–20)
CALCIUM: 8.5 mg/dL — AB (ref 8.9–10.3)
CO2: 19 mmol/L — ABNORMAL LOW (ref 22–32)
CREATININE: 0.81 mg/dL (ref 0.44–1.00)
Chloride: 110 mmol/L (ref 101–111)
Glucose, Bld: 256 mg/dL — ABNORMAL HIGH (ref 65–99)
Potassium: 4.2 mmol/L (ref 3.5–5.1)
SODIUM: 136 mmol/L (ref 135–145)
TOTAL PROTEIN: 6.2 g/dL — AB (ref 6.5–8.1)

## 2015-03-04 LAB — RESPIRATORY VIRUS PANEL
ADENOVIRUS: NEGATIVE
Influenza A: NEGATIVE
Influenza B: POSITIVE — AB
METAPNEUMOVIRUS: NEGATIVE
PARAINFLUENZA 1 A: NEGATIVE
PARAINFLUENZA 2 A: NEGATIVE
Parainfluenza 3: NEGATIVE
RHINOVIRUS: NEGATIVE
Respiratory Syncytial Virus A: NEGATIVE
Respiratory Syncytial Virus B: NEGATIVE

## 2015-03-04 LAB — GLUCOSE, CAPILLARY
GLUCOSE-CAPILLARY: 193 mg/dL — AB (ref 65–99)
GLUCOSE-CAPILLARY: 238 mg/dL — AB (ref 65–99)
GLUCOSE-CAPILLARY: 265 mg/dL — AB (ref 65–99)
GLUCOSE-CAPILLARY: 304 mg/dL — AB (ref 65–99)
Glucose-Capillary: 311 mg/dL — ABNORMAL HIGH (ref 65–99)

## 2015-03-04 LAB — MAGNESIUM: Magnesium: 1.9 mg/dL (ref 1.7–2.4)

## 2015-03-04 LAB — PHOSPHORUS: Phosphorus: 3.1 mg/dL (ref 2.5–4.6)

## 2015-03-04 LAB — STREP PNEUMONIAE URINARY ANTIGEN: Strep Pneumo Urinary Antigen: NEGATIVE

## 2015-03-04 LAB — PROCALCITONIN: PROCALCITONIN: 1.96 ng/mL

## 2015-03-04 MED ORDER — INSULIN ASPART 100 UNIT/ML ~~LOC~~ SOLN
12.0000 [IU] | Freq: Three times a day (TID) | SUBCUTANEOUS | Status: DC
  Administered 2015-03-04 – 2015-03-05 (×5): 12 [IU] via SUBCUTANEOUS
  Administered 2015-03-06: 18 [IU] via SUBCUTANEOUS

## 2015-03-04 MED ORDER — ACETYLCYSTEINE 10 % IN SOLN
2.0000 mL | Freq: Three times a day (TID) | RESPIRATORY_TRACT | Status: DC
  Filled 2015-03-04 (×2): qty 2

## 2015-03-04 MED ORDER — IPRATROPIUM BROMIDE 0.02 % IN SOLN
0.5000 mg | Freq: Three times a day (TID) | RESPIRATORY_TRACT | Status: DC
  Administered 2015-03-05 – 2015-03-08 (×11): 0.5 mg via RESPIRATORY_TRACT
  Filled 2015-03-04 (×11): qty 2.5

## 2015-03-04 MED ORDER — POLYETHYLENE GLYCOL 3350 17 G PO PACK
17.0000 g | PACK | Freq: Every day | ORAL | Status: DC
  Administered 2015-03-04 – 2015-03-08 (×5): 17 g via ORAL
  Filled 2015-03-04 (×9): qty 1

## 2015-03-04 NOTE — Progress Notes (Signed)
ANTICOAGULATION CONSULT NOTE   Pharmacy Consult for Lovenox  Indication: atrial fibrillation  Patient Measurements: Height: _0  (160 cm) Weight: 182 lb 9.6 oz (82.827 kg) IBW/kg (Calculated) : 52.4  Vital Signs: Temp: 97.8 F (36.6 C) (02/22 0801) Temp Source: Axillary (02/22 0801) BP: 133/71 mmHg (02/22 1000) Pulse Rate: 118 (02/22 1000)  Labs:  Recent Labs  03/02/15 0345 03/02/15 1749 03/03/15 0215 03/04/15 0628  HGB 9.7*  --  9.4* 10.1*  HCT 30.1*  --  30.1* 30.6*  PLT 167  --  183 171  CREATININE 0.89 0.85 0.82 0.81   Estimated Creatinine Clearance: 65 mL/min (by C-G formula based on Cr of 0.81).  Medical History: Past Medical History  Diagnosis Date  . Hypertension   . Diabetes mellitus without complication ([place])   . Hypercholesteremia   . Cancer [facility])    Assessment: 70 y/o F with 3 weeks of worsening dyspnea, found to be in afib/RVR, also with PNA per CXR also flu +.  CBC stable this am, renal function within normal limits, no anti-coagulation PTA. Plan was to continue lovenox until patient recovered more from pna/flu then transition to po anticoagulation.  Goal of Therapy:  Monitor platelets by anticoagulation protocol: Yes   Plan:  -Continue Lovenox 1 mg/kg subcutaneous q12h -Minimum q72h CBC while inpatient -Monitor for bleeding -Follow up converting to po AC  Erin Hearing PharmD., BCPS Clinical Pharmacist Pager [phone] 03/04/2015 11:20 AM

## 2015-03-04 NOTE — Progress Notes (Signed)
[place] TEAM 1 - Stepdown/ICU TEAM Progress Note  Ellen Jordan OEH:[id number] DOB: Jan 12, 1945 DOA: 02/28/2015 PCP: Robert Bellow, MD  Admit HPI / Brief Narrative: 70 y.o. WF PMHx DM Type 2, HTN, [name]   Presented with 3 weeks of worsening dyspnea on exertion w/ a productive cough and fever  HPI/Subjective: 2/22  sleepy but arousable, A/O 4, patient able to crack jokes with staff today. Was able to sit up on side of bed to eat meal. Afebrile overnight. Did not require BIPAP     Assessment/Plan: Sepsis pneumonia/Bibasilar positiveInfluenza B PNA w/ acute hypoxic respiratory failure  -Cont Tamiflu and Levaquin for possible bacterial PNA superinfection - given increased WOB I have asked PCCM to see her  -Physiotherapy vest BID -Solu-Medrol 60 mg daily -Mucinex DM BID -Continue flutter valve -Normal saline 100m/hr -BiPAP PRN increased WOB/PCO2   New onset Afib w/ RVR -Rate still not fully controlled but improved. Rate now remaining 90-110 - Still dehydrated but improving   - TSH normal  -Strict in and out since admission +7.3 L -Daily a.m. Weight admission= 80.2 kg                2/22 weight bed= 80.8 kg -Echocardiogram pending -Will hold off on adding additional agents at this time but with patient's BP digoxin would be the choice.  Elevated troponin -Peaked at 0.3 - explained by her RVR   Mod Hypophosphatemia  -Sodium phosphate 10 mmol    Hypokalemia  -Potassium goal > 4  -Replace and follow - Mg 1.7  Magnesium -Magnesium goal> 2  HTN -BP currently controlled  DM Type 2 -CBG reasonably controlled  -Continue Levemir 40 units QHS -Increase NovoLog 12 units QAC -Increase to resistant SSI -Hemoglobin A1c pending  [name] -Lipid panel pending      Code Status: FULL Family Communication: Daughter present at time of exam Disposition Plan: Resolution sepsis    Consultants: Dr.[facility]urali [facility]  Procedure/Significant Events: Echocardiogram  pending   Culture 2/18 blood right hand/left AC NGTD 2/18 MRSA by PCR negative 2/18 positive influenza B 2/18 respiratory virus panel pending 2/19 HIV negative   Antibiotics: Aztreonam 2/18 > 2/19 Vanc 2/18 > 2/19 Levaquin 2/18 >  Tamiflu 2/18 >  DVT prophylaxis: Full dose Lovenox   Devices    LINES / TUBES:      Continuous Infusions: . sodium chloride 75 mL/hr at 03/04/15 0700  . diltiazem (CARDIZEM) infusion 15 mg/hr (03/04/15 1118)    Objective: VITAL SIGNS: Temp: 97.8 F (36.6 C) (02/22 0801) Temp Source: Axillary (02/22 0801) BP: 133/71 mmHg (02/22 1000) Pulse Rate: 118 (02/22 1000) SPO2; FIO2:   Intake/Output Summary (Last 24 hours) at 03/04/15 1147 Last data filed at 03/04/15 1000  Gross per 24 hour  Intake   2330 ml  Output   3245 ml  Net   -915 ml     Exam: General: A/O 4, much more relaxed today, Positive acute respiratory distress (improved did not require BiPAP)  Eyes: Negative headache, negative scleral hemorrhage ENT: Negative Runny nose, negative gingival bleeding, Neck:  Negative scars, masses, torticollis, lymphadenopathy, JVD Lungs: diffuse rhonchi, positive expiratory wheezes, negative crackles  Cardiovascular: Tachycardic, Regular rhythm without murmur gallop or rub normal S1 and S2 Abdomen:negative abdominal pain, nondistended, positive soft, bowel sounds, no rebound, no ascites, no appreciable mass Extremities: No significant cyanosis, clubbing, or edema bilateral lower extremities Psychiatric:  Negative depression, negative anxiety, negative fatigue, negative mania  Neurologic:  Cranial nerves II through XII  intact, tongue/uvula midline, all extremities muscle strength 5/5, sensation intact throughout,  negative dysarthria, negative expressive aphasia, negative receptive aphasia.   Data Reviewed: Basic Metabolic Panel:  Recent Labs Lab 02/28/15 1418 02/28/15 1421 03/02/15 0345 03/02/15 1710 03/02/15 1749  03/02/15 2133 03/03/15 0215 03/04/15 0628  NA 130* 132* 137  --  135  --  137 136  K 3.5 3.6 3.4* 3.5 3.6  --  4.0 4.2  CL 97* 98* 110  --  110  --  113* 110  CO2 19*  --  18*  --  17*  --  18* 19*  GLUCOSE 200* 198* 149*  --  131*  --  187* 256*  BUN 21* 20 16  --  17  --  16 14  CREATININE 0.93 0.90 0.89  --  0.85  --  0.82 0.81  CALCIUM 8.9  --  8.3*  --  7.9*  --  8.3* 8.5*  MG  --   --  1.7  --   --   --   --  1.9  PHOS  --   --  1.8*  --   --  2.3* 2.1* 3.1   Liver Function Tests:  Recent Labs Lab 02/28/15 1418 03/02/15 0345 03/03/15 0215 03/04/15 0628  AST 39 33 46* 44*  ALT 32 29 39 47  ALKPHOS 56 45 66 58  BILITOT 1.3* 0.6 0.5 0.6  PROT 7.7 5.8* 6.1* 6.2*  ALBUMIN 3.7 2.5* 2.3* 2.3*   No results for input(s): LIPASE, AMYLASE in the last 168 hours. No results for input(s): AMMONIA in the last 168 hours. CBC:  Recent Labs Lab 02/28/15 1418 02/28/15 1421 03/01/15 0350 03/02/15 0345 03/03/15 0215 03/04/15 0628  WBC 12.7*  --  7.9 8.3 8.3 6.3  HGB 13.1 13.9 10.1* 9.7* 9.4* 10.1*  HCT 38.2 41.0 31.3* 30.1* 30.1* 30.6*  MCV 85.3  --  84.8 85.8 88.0 87.7  PLT 153  --  153 167 183 171   Cardiac Enzymes:  Recent Labs Lab 02/28/15 1418 03/01/15 0350 03/01/15 0351 03/01/15 0914  TROPONINI 0.04* 0.11* 0.30* 0.08*   BNP (last 3 results)  Recent Labs  02/28/15 1418 03/01/15 0351  BNP 375.0* 408.7*    ProBNP (last 3 results) No results for input(s): PROBNP in the last 8760 hours.  CBG:  Recent Labs Lab 03/03/15 1134 03/03/15 1712 03/03/15 2001 03/03/15 2318 03/04/15 0816  GLUCAP 162* 204* 224* 193* 238*    Recent Results (from the past 240 hour(s))  Blood culture (routine x 2)     Status: None (Preliminary result)   Collection Time: 02/28/15  2:38 PM  Result Value Ref Range Status   Specimen Description BLOOD RIGHT HAND  Final   Special Requests BOTTLES DRAWN AEROBIC AND ANAEROBIC 6CC EACH  Final   Culture NO GROWTH 4 DAYS  Final    Report Status PENDING  Incomplete  Blood culture (routine x 2)     Status: None (Preliminary result)   Collection Time: 02/28/15  2:44 PM  Result Value Ref Range Status   Specimen Description BLOOD LEFT ANTECUBITAL  Final   Special Requests BOTTLES DRAWN AEROBIC AND ANAEROBIC [facility] EACH  Final   Culture NO GROWTH 4 DAYS  Final   Report Status PENDING  Incomplete  Respiratory virus panel     Status: Abnormal   Collection Time: 02/28/15  6:04 PM  Result Value Ref Range Status   Respiratory Syncytial Virus A Negative Negative Final   Respiratory  Syncytial Virus B Negative Negative Final   Influenza A Negative Negative Final   Influenza B Positive (A) Negative Final   Parainfluenza 1 Negative Negative Final   Parainfluenza 2 Negative Negative Final   Parainfluenza 3 Negative Negative Final   Metapneumovirus Negative Negative Final   Rhinovirus Negative Negative Final   Adenovirus Negative Negative Final    Comment: (NOTE) Performed At: [facility] [place], [place] [id number] Lindon Romp MD WC:[id number]   MRSA PCR Screening     Status: None   Collection Time: 02/28/15  9:23 PM  Result Value Ref Range Status   MRSA by PCR NEGATIVE NEGATIVE Final    Comment:        The GeneXpert MRSA Assay (FDA approved for NASAL specimens only), is one component of a comprehensive MRSA colonization surveillance program. It is not intended to diagnose MRSA infection nor to guide or monitor treatment for MRSA infections.   MRSA PCR Screening     Status: None   Collection Time: 03/02/15 12:29 PM  Result Value Ref Range Status   MRSA by PCR NEGATIVE NEGATIVE Final    Comment:        The GeneXpert MRSA Assay (FDA approved for NASAL specimens only), is one component of a comprehensive MRSA colonization surveillance program. It is not intended to diagnose MRSA infection nor to guide or monitor treatment for MRSA infections.      Studies:  Recent x-ray studies  have been reviewed in detail by the Attending Physician  Scheduled Meds:  Scheduled Meds: . ALPRAZolam  0.5 mg Oral TID  . aspirin EC  81 mg Oral Daily  . budesonide (PULMICORT) nebulizer solution  0.5 mg Nebulization BID  . dextromethorphan-guaiFENesin  1 tablet Oral BID  . enoxaparin (LOVENOX) injection  1 mg/kg Subcutaneous Q12H  . insulin aspart  0-5 Units Subcutaneous QHS  . insulin aspart  8 Units Subcutaneous TID AC  . insulin detemir  40 Units Subcutaneous QHS  . ipratropium  0.5 mg Nebulization Q4H  . levofloxacin  750 mg Oral Daily  . methylPREDNISolone (SOLU-MEDROL) injection  60 mg Intravenous Q24H  . metoprolol  10 mg Intravenous 4 times per day  . oseltamivir  30 mg Oral BID  . sertraline  100 mg Oral Daily  . simvastatin  40 mg Oral Daily    Time spent on care of this patient: 40 mins   Matilda Fleig, Geraldo Docker , MD  [facility] Office  [phone] Pager [phone]  On-Call/Text Page:      [email or web address]      password [email or web address]  If 7PM-7AM, please contact night-coverage [email or web address] Password [facility] 03/04/2015, 11:47 AM   LOS: 4 days   Care during the described time interval was provided by me .  I have reviewed this patient's available data, including medical history, events of note, physical examination, and all test results as part of my evaluation. I have personally reviewed and interpreted all radiology studies.   Dia Crawford, MD [phone] Pager

## 2015-03-05 ENCOUNTER — Ambulatory Visit (HOSPITAL_COMMUNITY): Payer: Commercial Managed Care - HMO

## 2015-03-05 DIAGNOSIS — E785 Hyperlipidemia, unspecified: Secondary | ICD-10-CM | POA: Diagnosis present

## 2015-03-05 LAB — GLUCOSE, CAPILLARY
GLUCOSE-CAPILLARY: 272 mg/dL — AB (ref 65–99)
Glucose-Capillary: 301 mg/dL — ABNORMAL HIGH (ref 65–99)
Glucose-Capillary: 278 mg/dL — ABNORMAL HIGH (ref 65–99)
Glucose-Capillary: 267 mg/dL — ABNORMAL HIGH (ref 65–99)

## 2015-03-05 LAB — COMPREHENSIVE METABOLIC PANEL
ALK PHOS: 58 U/L (ref 38–126)
ALT: 75 U/L — AB (ref 14–54)
AST: 75 U/L — AB (ref 15–41)
Albumin: 2.3 g/dL — ABNORMAL LOW (ref 3.5–5.0)
Anion gap: 11 (ref 5–15)
BILIRUBIN TOTAL: 0.4 mg/dL (ref 0.3–1.2)
BUN: 17 mg/dL (ref 6–20)
CALCIUM: 8.8 mg/dL — AB (ref 8.9–10.3)
CHLORIDE: 108 mmol/L (ref 101–111)
CO2: 21 mmol/L — ABNORMAL LOW (ref 22–32)
CREATININE: 0.74 mg/dL (ref 0.44–1.00)
Glucose, Bld: 291 mg/dL — ABNORMAL HIGH (ref 65–99)
Potassium: 4.2 mmol/L (ref 3.5–5.1)
Sodium: 140 mmol/L (ref 135–145)
TOTAL PROTEIN: 6.3 g/dL — AB (ref 6.5–8.1)

## 2015-03-05 LAB — EXPECTORATED SPUTUM ASSESSMENT W REFEX TO RESP CULTURE: SPECIAL REQUESTS: NORMAL

## 2015-03-05 LAB — CULTURE, BLOOD (ROUTINE X 2)
CULTURE: NO GROWTH
Culture: NO GROWTH

## 2015-03-05 LAB — LIPID PANEL
CHOLESTEROL: 89 mg/dL (ref 0–200)
HDL: 14 mg/dL — AB (ref >40)
LDL Cholesterol: 52 mg/dL (ref 0–99)
Total CHOL/HDL Ratio: 6.4 RATIO
Triglycerides: 113 mg/dL (ref <150)
VLDL: 23 mg/dL (ref 0–40)

## 2015-03-05 LAB — CBC
HCT: 30.8 % — ABNORMAL LOW (ref 36.0–46.0)
Hemoglobin: 9.7 g/dL — ABNORMAL LOW (ref 12.0–15.0)
MCH: 27.2 pg (ref 26.0–34.0)
MCHC: 31.5 g/dL (ref 30.0–36.0)
MCV: 86.5 fL (ref 78.0–100.0)
PLATELETS: 215 10*3/uL (ref 150–400)
RBC: 3.56 MIL/uL — AB (ref 3.87–5.11)
RDW: 14.9 % (ref 11.5–15.5)
WBC: 8 10*3/uL (ref 4.0–10.5)

## 2015-03-05 LAB — LEGIONELLA ANTIGEN, URINE

## 2015-03-05 LAB — PHOSPHORUS: Phosphorus: 3 mg/dL (ref 2.5–4.6)

## 2015-03-05 LAB — MAGNESIUM: Magnesium: 1.8 mg/dL (ref 1.7–2.4)

## 2015-03-05 MED ORDER — METOPROLOL TARTRATE 12.5 MG HALF TABLET
12.5000 mg | ORAL_TABLET | Freq: Two times a day (BID) | ORAL | Status: DC
  Administered 2015-03-05 (×2): 12.5 mg via ORAL
  Filled 2015-03-05 (×2): qty 1

## 2015-03-05 MED ORDER — DIGOXIN 125 MCG PO TABS
0.1250 mg | ORAL_TABLET | Freq: Every day | ORAL | Status: DC
  Administered 2015-03-05 – 2015-03-07 (×3): 0.125 mg via ORAL
  Filled 2015-03-05 (×3): qty 1

## 2015-03-05 MED ORDER — INSULIN ASPART 100 UNIT/ML ~~LOC~~ SOLN
0.0000 [IU] | SUBCUTANEOUS | Status: DC
  Administered 2015-03-05 (×2): 11 [IU] via SUBCUTANEOUS
  Administered 2015-03-06: 3 [IU] via SUBCUTANEOUS
  Administered 2015-03-06: 7 [IU] via SUBCUTANEOUS
  Administered 2015-03-06: 4 [IU] via SUBCUTANEOUS
  Administered 2015-03-06: 11 [IU] via SUBCUTANEOUS
  Administered 2015-03-06: 15 [IU] via SUBCUTANEOUS
  Administered 2015-03-06: 7 [IU] via SUBCUTANEOUS
  Administered 2015-03-06: 15 [IU] via SUBCUTANEOUS
  Administered 2015-03-07: 7 [IU] via SUBCUTANEOUS
  Administered 2015-03-07 (×2): 4 [IU] via SUBCUTANEOUS
  Administered 2015-03-07: 11 [IU] via SUBCUTANEOUS
  Administered 2015-03-07: 7 [IU] via SUBCUTANEOUS
  Administered 2015-03-07 – 2015-03-08 (×2): 3 [IU] via SUBCUTANEOUS
  Administered 2015-03-08: 4 [IU] via SUBCUTANEOUS
  Administered 2015-03-08: 3 [IU] via SUBCUTANEOUS
  Administered 2015-03-08: 4 [IU] via SUBCUTANEOUS
  Administered 2015-03-09: 7 [IU] via SUBCUTANEOUS
  Administered 2015-03-09 (×2): 4 [IU] via SUBCUTANEOUS

## 2015-03-05 MED ORDER — ALPRAZOLAM 0.5 MG PO TABS
0.5000 mg | ORAL_TABLET | Freq: Every day | ORAL | Status: DC
  Administered 2015-03-06 – 2015-03-11 (×6): 0.5 mg via ORAL
  Filled 2015-03-05: qty 2
  Filled 2015-03-05 (×6): qty 1

## 2015-03-05 MED ORDER — ACETYLCYSTEINE 20 % IN SOLN
2.0000 mL | Freq: Three times a day (TID) | RESPIRATORY_TRACT | Status: DC
  Administered 2015-03-05 – 2015-03-06 (×6): 2 mL via RESPIRATORY_TRACT
  Administered 2015-03-07: 21:00:00 via RESPIRATORY_TRACT
  Administered 2015-03-07 (×2): 4 mL via RESPIRATORY_TRACT
  Administered 2015-03-08 (×2): 2 mL via RESPIRATORY_TRACT
  Filled 2015-03-05 (×12): qty 4

## 2015-03-05 MED ORDER — METHYLPREDNISOLONE SODIUM SUCC 40 MG IJ SOLR
30.0000 mg | INTRAMUSCULAR | Status: DC
  Administered 2015-03-05 – 2015-03-07 (×3): 30 mg via INTRAVENOUS
  Filled 2015-03-05 (×3): qty 1

## 2015-03-05 MED ORDER — ALBUTEROL SULFATE (2.5 MG/3ML) 0.083% IN NEBU
INHALATION_SOLUTION | RESPIRATORY_TRACT | Status: AC
  Filled 2015-03-05: qty 3

## 2015-03-05 NOTE — Evaluation (Signed)
Physical Therapy Evaluation Patient Details Name: Ellen Jordan MRN: [id number] DOB: 08-Apr-1945 Today's Date: 03/05/2015   History of Present Illness  70 y.o. WF PMHx DM Type 2, HTN, [name] who present with worsening DOE, productive cough and fever. Found to have Spesis, pnuemonia/bibasilar positive influenza B and PNA with acute hypoxic respiratory failure.  Clinical Impression  Patient demonstrates deficits in functional mobility as indicated below. Will need continued skilled PT to address deficits and maximize function. Will see as indicated and rorgress as tolerated.   OF NOTE: O2 saturations >93% on room air with activity, HR elevated to mid 140s during session with HR at rest fluctuating from 110s-130s. Continuous productive cough noted.    Follow Up Recommendations SNF;Supervision/Assistance - 24 hour    Equipment Recommendations  Rolling walker with 5" wheels    Recommendations for Other Services       Precautions / Restrictions Precautions Precautions: Fall Restrictions Weight Bearing Restrictions: No      Mobility  Bed Mobility Overal bed mobility: Needs Assistance Bed Mobility: Supine to Sit     Supine to sit: Min guard;HOB elevated     General bed mobility comments: min guard to come to EOB, increased time and effort required, no physical assist needed. Cues for positioning and use of rail.  Transfers Overall transfer level: Needs assistance Equipment used: Rolling walker (2 wheeled) Transfers: Sit to/from Stand Sit to Stand: Min assist         General transfer comment: Min assist to power up to standing from bed, VCs for hand placement and positioning with RW. increased time to elevate trunk to upright position. Initial LOB posteriorly.  Ambulation/Gait Ambulation/Gait assistance: Min assist Ambulation Distance (Feet): 12 Feet Assistive device: Rolling walker (2 wheeled) Gait Pattern/deviations: Step-through pattern;Decreased stride  length;Shuffle;Trunk flexed Gait velocity: decreased Gait velocity interpretation: <1.8 ft/sec, indicative of risk for recurrent falls General Gait Details: very slow and cautious with mobility. Limited by fatigue. min assist for stability secondary to noted instablity despite use of RW.  Stairs            Wheelchair Mobility    Modified Rankin (Stroke Patients Only)       Balance Overall balance assessment: Needs assistance Sitting-balance support: No upper extremity supported Sitting balance-Leahy Scale: Fair Sitting balance - Comments: posterior lean during dynamic EOB sitting activities Postural control: Posterior lean Standing balance support: During functional activity Standing balance-Leahy Scale: Poor Standing balance comment: heavy reliance on RW, instability noted with increased posterior bias in static standing                             Pertinent Vitals/Pain Pain Assessment: No/denies pain    Home Living Family/patient expects to be discharged to:: Private residence Living Arrangements: Spouse/significant other Available Help at Discharge: Available 24 hours/day Type of Home: House Home Access: Stairs to enter Entrance Stairs-Rails: Right Entrance Stairs-Number of Steps: 4 Home Layout: One level Home Equipment: None;Grab bars - tub/shower      Prior Function Level of Independence: Independent         Comments: still drives     Hand Dominance   Dominant Hand: Right    Extremity/Trunk Assessment   Upper Extremity Assessment: Generalized weakness           Lower Extremity Assessment: Generalized weakness;RLE deficits/detail RLE Deficits / Details: limited ROM in RLE secondary spinal surgery in the past and reports RLE never quite the same  Cervical / Trunk Assessment:  (increased body habitus)  Communication   Communication: No difficulties  Cognition Arousal/Alertness: Awake/alert Behavior During Therapy: Flat  affect Overall Cognitive Status: Within Functional Limits for tasks assessed                      General Comments General comments (skin integrity, edema, etc.): eudcated on safety with mobility and need to perform basic tasks as able. Encouraged energy conservation and educated on pursed lip breathing.     Exercises        Assessment/Plan    PT Assessment Patient needs continued PT services  PT Diagnosis Difficulty walking;Abnormality of gait;Generalized weakness;Acute pain   PT Problem List Decreased strength;Decreased range of motion;Decreased activity tolerance;Decreased balance;Decreased mobility;Decreased coordination;Cardiopulmonary status limiting activity  PT Treatment Interventions DME instruction;Gait training;Stair training;Functional mobility training;Therapeutic activities;Therapeutic exercise;Balance training;Patient/family education   PT Goals (Current goals can be found in the Care Plan section) Acute Rehab PT Goals Patient Stated Goal: to go home as soon as she can PT Goal Formulation: With patient/family Time For Goal Achievement: 03/19/15 Potential to Achieve Goals: Good    Frequency Min 2X/week   Barriers to discharge Inaccessible home environment;Decreased caregiver support stairs to enter the home; husband works during the day and daughter also works.     Co-evaluation PT/OT/SLP Co-Evaluation/Treatment: Yes Reason for Co-Treatment: Complexity of the patient's impairments (multi-system involvement);For patient/therapist safety PT goals addressed during session: Mobility/safety with mobility         End of Session Equipment Utilized During Treatment: Gait belt Activity Tolerance: Patient limited by fatigue Patient left: in chair;with call bell/phone within reach;with family/visitor present Nurse Communication: Mobility status         Time: 0902-0926 PT Time Calculation (min) (ACUTE ONLY): 24 min   Charges:   PT Evaluation $PT Eval  Moderate Complexity: 1 Procedure     PT G CodesDuncan Dull 03/10/15, 9:50 AM Alben Deeds, PT DPT  [phone]

## 2015-03-05 NOTE — Evaluation (Signed)
Occupational Therapy Evaluation Patient Details Name: Ellen Jordan MRN: [id number] DOB: 01-22-1945 Today's Date: 03/05/2015    History of Present Illness 70 y.o. WF PMHx DM Type 2, HTN, [name] who present with worsening DOE, productive cough and fever. Found to have Spesis, pnuemonia/bibasilar positive influenza B and PNA with acute hypoxic respiratory failure.   Clinical Impression   This 70 yo female admitted with above presents with deficits below affecting her ability to care for herself at an independent level as she was pta. She will benefit from acute OT with follow up at SNF to get back to PLOF.    Follow Up Recommendations  SNF    Equipment Recommendations  3 in 1 bedside comode;Tub/shower seat       Precautions / Restrictions Precautions Precautions: Fall Restrictions Weight Bearing Restrictions: No      Mobility Bed Mobility Overal bed mobility: Needs Assistance Bed Mobility: Supine to Sit     Supine to sit: Min guard;HOB elevated     General bed mobility comments: min guard to come to EOB, increased time and effort required, no physical assist needed. Cues for positioning and use of rail.  Transfers Overall transfer level: Needs assistance Equipment used: Rolling walker (2 wheeled) Transfers: Sit to/from Stand Sit to Stand: Min assist         General transfer comment: Min assist to power up to standing from bed, VCs for hand placement and positioning with RW. increased time to elevate trunk to upright position. Initial LOB posteriorly.    Balance Overall balance assessment: Needs assistance Sitting-balance support: No upper extremity supported Sitting balance-Leahy Scale: Fair Sitting balance - Comments: posterior lean during dynamic EOB sitting activities Postural control: Posterior lean Standing balance support: During functional activity Standing balance-Leahy Scale: Poor Standing balance comment: heavy reliance on RW, instability noted with  increased posterior bias in static standing                            ADL Overall ADL's : Needs assistance/impaired Eating/Feeding: Independent;Sitting   Grooming: Set up;Sitting   Upper Body Bathing: Set up;Sitting   Lower Body Bathing: Moderate assistance (min A sit<>stand)   Upper Body Dressing : Minimal assistance;Sitting   Lower Body Dressing: Moderate assistance (min A sit<>stand)       Toileting- Clothing Manipulation and Hygiene: Minimal assistance (+1 addtional for lines/tubes)                         Pertinent Vitals/Pain Pain Assessment: No/denies pain     Hand Dominance Right   Extremity/Trunk Assessment Upper Extremity Assessment Upper Extremity Assessment: Generalized weakness   Lower Extremity Assessment Lower Extremity Assessment: Generalized weakness;RLE deficits/detail RLE Deficits / Details: limited ROM in RLE secondary spinal surgery in the past and reports RLE never quite the same RLE Coordination: decreased fine motor;decreased gross motor   Cervical / Trunk Assessment Cervical / Trunk Assessment:  (increased body habitus)   Communication Communication Communication: No difficulties   Cognition Arousal/Alertness: Awake/alert Behavior During Therapy: Flat affect Overall Cognitive Status: Within Functional Limits for tasks assessed                                Home Living Family/patient expects to be discharged to:: Private residence Living Arrangements: Spouse/significant other Available Help at Discharge: Available 24 hours/day Type of Home: House Home Access: Stairs  to enter Entrance Stairs-Number of Steps: 4 Entrance Stairs-Rails: Right Home Layout: One level     Bathroom Shower/Tub: Walk-in shower;Door   [employer] Toilet: Standard     Home Equipment: None;Grab bars - tub/shower          Prior Functioning/Environment Level of Independence: Independent        Comments: still drives     OT Diagnosis: Generalized weakness   OT Problem List: Decreased strength;Decreased range of motion;Impaired balance (sitting and/or standing);Cardiopulmonary status limiting activity;Obesity;Decreased knowledge of use of DME or AE   OT Treatment/Interventions: Self-care/ADL training;Patient/family education;Balance training;Therapeutic activities;Energy conservation;DME and/or AE instruction    OT Goals(Current goals can be found in the care plan section) Acute Rehab OT Goals Patient Stated Goal: to go home as soon as she can OT Goal Formulation: With patient Time For Goal Achievement: 03/19/15 Potential to Achieve Goals: Good  OT Frequency: Min 2X/week   Barriers to D/C: Decreased caregiver support  husband works and daughter works in [employer]   Reason for Co-Treatment: Complexity of the patient's impairments (multi-system involvement);For patient/therapist safety PT goals addressed during session: Mobility/safety with mobility        End of Session Equipment Utilized During Treatment: Gait belt;Rolling walker Nurse Communication:  (asked if OK to leave O2 off (pt's sats while eating 93-98%) RN ok'd to leave it off)  Activity Tolerance: Patient limited by fatigue Patient left: in chair;with call bell/phone within reach;with family/visitor present   Time: 0902-0926 OT Time Calculation (min): 24 min Charges:  OT General Charges $OT Visit: 1 Procedure OT Evaluation $OT Eval Moderate Complexity: 1 Procedure  Almon Register [phone] 03/05/2015, 9:52 AM

## 2015-03-05 NOTE — Progress Notes (Signed)
Echocardiogram 2D Echocardiogram has been performed.  Tresa Res 03/05/2015, 3:45 PM

## 2015-03-05 NOTE — Progress Notes (Signed)
Inpatient Diabetes Program Recommendations  AACE/ADA: New Consensus Statement on Inpatient Glycemic Control (2015)  Target Ranges:  Prepandial:   less than 140 mg/dL      Peak postprandial:   less than 180 mg/dL (1-2 hours)      Critically ill patients:  140 - 180 mg/dL   Review of Glycemic Control:  Results for TATE, JERKINS (MRN [id number]) as of 03/05/2015 10:55  Ref. Range 03/04/2015 08:16 03/04/2015 12:27 03/04/2015 16:38 03/04/2015 22:40 03/05/2015 08:02  Glucose-Capillary Latest Ref Range: 65-99 mg/dL 238 (H) 265 (H) 304 (H) 311 (H) 272 (H)   Diabetes history: Type 2 diabetes Inpatient Diabetes Program Recommendations:    Please consider restarting Novolog moderate correction tid with meals and HS.    Thanks, Adah Perl, RN, BC-ADM Inpatient Diabetes Coordinator Pager [phone] (8a-5p)

## 2015-03-05 NOTE — Progress Notes (Signed)
[place] TEAM 1 - Stepdown/ICU TEAM Progress Note  Ellen Jordan SLH:[id number] DOB: May 30, 1945 DOA: 02/28/2015 PCP: Robert Bellow, MD  Admit HPI / Brief Narrative: 70 y.o. WF PMHx DM Type 2, HTN, [name]   Presented with 3 weeks of worsening dyspnea on exertion w/ a productive cough and fever  HPI/Subjective: 2/23  A/O 4, sitting in chair eating lunch. Daughter states patient ate most of her breakfast also. Patient's only complaint is continued fatigue. States physiotherapy vest is working well to help break up congestion.      Assessment/Plan: Sepsis pneumonia/Bibasilar positiveInfluenza B PNA w/ acute hypoxic respiratory failure  -Cont Tamiflu and Levaquin for possible bacterial PNA superinfection, complete 7 day course -Physiotherapy vest BID -Decrease Solu-Medrol 30 mg daily -Mucinex DM BID -Continue flutter valve -Normal saline 50 ml/hr -BiPAP PRN increased WOB/PCO2   New onset Afib w/ RVR -Rate still not fully controlled  - TSH normal  -Strict in and out since admission +5.3 L -Daily a.m. Weight admission= 80.2 kg                2/23 weight bed= 80.8 kg -Echocardiogram pending -Continue Cardizem drip. Hope to titrate off once digoxin takes effect -Continue Metoprolol 12.5 mg BID - start Digoxin 0.125 mg daily  Elevated troponin -Peaked at 0.3 - explained by her RVR   Mod Hypophosphatemia  -Resolved  Hypokalemia  -Potassium goal > 4  -Replace and follow - Mg 1.7  Magnesium -Magnesium goal> 2  HTN -BP currently controlled  DM Type 2 -CBG reasonably controlled  -Continue Levemir 40 units QHS -Continue NovoLog 12 units QAC -Continue resistant SSI -Hemoglobin A1c pending  [name] -Lipid panel pending      Code Status: FULL Family Communication: Daughter present at time of exam Disposition Plan: Resolution sepsis    Consultants: Dr.Murali Chase Caller [facility] M  Procedure/Significant Events: Echocardiogram pending   Culture 2/18 blood  right hand/left AC NEGATIVE FINAL 2/18 MRSA by PCR negative 2/18 positive influenza B 2/18 respiratory virus panel positive influenza B 2/19 HIV negative 2/23 sputum pending   Antibiotics: Aztreonam 2/18 > 2/19 Vanc 2/18 > 2/19 Levaquin 2/18 >  Tamiflu 2/18 >  DVT prophylaxis: Full dose Lovenox   Devices    LINES / TUBES:      Continuous Infusions: . sodium chloride 75 mL/hr at 03/05/15 0800  . diltiazem (CARDIZEM) infusion 15 mg/hr (03/05/15 1049)    Objective: VITAL SIGNS: Temp: 98.1 F (36.7 C) (02/23 1206) Temp Source: Oral (02/23 1206) BP: 123/59 mmHg (02/23 1206) Pulse Rate: 88 (02/23 1206) SPO2; FIO2:   Intake/Output Summary (Last 24 hours) at 03/05/15 1417 Last data filed at 03/05/15 1200  Gross per 24 hour  Intake   2540 ml  Output   4250 ml  Net  -1710 ml     Exam: General: A/O 4, much more alert today, Positive acute respiratory distress (able to move around today)  Eyes: Negative headache, negative scleral hemorrhage ENT: Negative Runny nose, negative gingival bleeding, Neck:  Negative scars, masses, torticollis, lymphadenopathy, JVD Lungs: diffuse rhonchi almost resolved, positive very mild expiratory wheezes, negative crackles  Cardiovascular: Irregular irregular rhythm and rate without murmur gallop or rub normal S1 and S2 Abdomen:negative abdominal pain, nondistended, positive soft, bowel sounds, no rebound, no ascites, no appreciable mass Extremities: No significant cyanosis, clubbing, or edema bilateral lower extremities Psychiatric:  Negative depression, negative anxiety, negative fatigue, negative mania  Neurologic:  Cranial nerves II through XII intact, tongue/uvula midline, all extremities muscle strength  5/5, sensation intact throughout,  negative dysarthria, negative expressive aphasia, negative receptive aphasia.   Data Reviewed: Basic Metabolic Panel:  Recent Labs Lab 03/02/15 0345 03/02/15 1710 03/02/15 1749  03/02/15 2133 03/03/15 0215 03/04/15 0628 03/05/15 0445  NA 137  --  135  --  137 136 140  K 3.4* 3.5 3.6  --  4.0 4.2 4.2  CL 110  --  110  --  113* 110 108  CO2 18*  --  17*  --  18* 19* 21*  GLUCOSE 149*  --  131*  --  187* 256* 291*  BUN 16  --  17  --  _0 CREATININE 0.89  --  0.85  --  0.82 0.81 0.74  CALCIUM 8.3*  --  7.9*  --  8.3* 8.5* 8.8*  MG 1.7  --   --   --   --  1.9 1.8  PHOS 1.8*  --   --  2.3* 2.1* 3.1 3.0   Liver Function Tests:  Recent Labs Lab 02/28/15 1418 03/02/15 0345 03/03/15 0215 03/04/15 0628 03/05/15 0445  AST 39 33 46* 44* 75*  ALT 32 29 39 47 75*  ALKPHOS 56 45 66 58 58  BILITOT 1.3* 0.6 0.5 0.6 0.4  PROT 7.7 5.8* 6.1* 6.2* 6.3*  ALBUMIN 3.7 2.5* 2.3* 2.3* 2.3*   No results for input(s): LIPASE, AMYLASE in the last 168 hours. No results for input(s): AMMONIA in the last 168 hours. CBC:  Recent Labs Lab 03/01/15 0350 03/02/15 0345 03/03/15 0215 03/04/15 0628 03/05/15 0445  WBC 7.9 8.3 8.3 6.3 8.0  HGB 10.1* 9.7* 9.4* 10.1* 9.7*  HCT 31.3* 30.1* 30.1* 30.6* 30.8*  MCV 84.8 85.8 88.0 87.7 86.5  PLT 153 167 183 171 215   Cardiac Enzymes:  Recent Labs Lab 02/28/15 1418 03/01/15 0350 03/01/15 0351 03/01/15 0914  TROPONINI 0.04* 0.11* 0.30* 0.08*   BNP (last 3 results)  Recent Labs  02/28/15 1418 03/01/15 0351  BNP 375.0* 408.7*    ProBNP (last 3 results) No results for input(s): PROBNP in the last 8760 hours.  CBG:  Recent Labs Lab 03/04/15 1227 03/04/15 1638 03/04/15 2240 03/05/15 0802 03/05/15 1211  GLUCAP 265* 304* 311* 272* 301*    Recent Results (from the past 240 hour(s))  Blood culture (routine x 2)     Status: None   Collection Time: 02/28/15  2:38 PM  Result Value Ref Range Status   Specimen Description BLOOD RIGHT HAND  Final   Special Requests BOTTLES DRAWN AEROBIC AND ANAEROBIC [facility] EACH  Final   Culture NO GROWTH 5 DAYS  Final   Report Status 03/05/2015 FINAL  Final  Blood culture  (routine x 2)     Status: None   Collection Time: 02/28/15  2:44 PM  Result Value Ref Range Status   Specimen Description BLOOD LEFT ANTECUBITAL  Final   Special Requests BOTTLES DRAWN AEROBIC AND ANAEROBIC [facility] EACH  Final   Culture NO GROWTH 5 DAYS  Final   Report Status 03/05/2015 FINAL  Final  Respiratory virus panel     Status: Abnormal   Collection Time: 02/28/15  6:04 PM  Result Value Ref Range Status   Respiratory Syncytial Virus A Negative Negative Final   Respiratory Syncytial Virus B Negative Negative Final   Influenza A Negative Negative Final   Influenza B Positive (A) Negative Final   Parainfluenza 1 Negative Negative Final   Parainfluenza 2 Negative Negative Final   Parainfluenza  3 Negative Negative Final   Metapneumovirus Negative Negative Final   Rhinovirus Negative Negative Final   Adenovirus Negative Negative Final    Comment: (NOTE) Performed At: [facility] [place], [place] [id number] Lindon Romp MD UO:[id number]   MRSA PCR Screening     Status: None   Collection Time: 02/28/15  9:23 PM  Result Value Ref Range Status   MRSA by PCR NEGATIVE NEGATIVE Final    Comment:        The GeneXpert MRSA Assay (FDA approved for NASAL specimens only), is one component of a comprehensive MRSA colonization surveillance program. It is not intended to diagnose MRSA infection nor to guide or monitor treatment for MRSA infections.   MRSA PCR Screening     Status: None   Collection Time: 03/02/15 12:29 PM  Result Value Ref Range Status   MRSA by PCR NEGATIVE NEGATIVE Final    Comment:        The GeneXpert MRSA Assay (FDA approved for NASAL specimens only), is one component of a comprehensive MRSA colonization surveillance program. It is not intended to diagnose MRSA infection nor to guide or monitor treatment for MRSA infections.   Culture, sputum-assessment     Status: None   Collection Time: 03/05/15  9:31 AM  Result Value Ref  Range Status   Specimen Description SPUTUM  Final   Special Requests Normal  Final   Sputum evaluation   Final    THIS SPECIMEN IS ACCEPTABLE. RESPIRATORY CULTURE REPORT TO FOLLOW.   Report Status 03/05/2015 FINAL  Final     Studies:  Recent x-ray studies have been reviewed in detail by the Attending Physician  Scheduled Meds:  Scheduled Meds: . acetylcysteine  2 mL Nebulization TID  . [START ON 03/06/2015] ALPRAZolam  0.5 mg Oral QHS  . aspirin EC  81 mg Oral Daily  . budesonide (PULMICORT) nebulizer solution  0.5 mg Nebulization BID  . dextromethorphan-guaiFENesin  1 tablet Oral BID  . digoxin  0.125 mg Oral Daily  . enoxaparin (LOVENOX) injection  1 mg/kg Subcutaneous Q12H  . insulin aspart  0-20 Units Subcutaneous 6 times per day  . insulin aspart  0-5 Units Subcutaneous QHS  . insulin aspart  12 Units Subcutaneous TID AC  . insulin detemir  40 Units Subcutaneous QHS  . ipratropium  0.5 mg Nebulization TID  . methylPREDNISolone (SOLU-MEDROL) injection  30 mg Intravenous Q24H  . metoprolol tartrate  12.5 mg Oral BID  . polyethylene glycol  17 g Oral Daily  . sertraline  100 mg Oral Daily  . simvastatin  40 mg Oral Daily    Time spent on care of this patient: 40 mins   Huntington Leverich, Geraldo Docker , MD  [facility] Office  [phone] Pager [phone]  On-Call/Text Page:      [email or web address]      password [email or web address]  If 7PM-7AM, please contact night-coverage [email or web address] Password [email or web address] 03/05/2015, 2:17 PM   LOS: 5 days   Care during the described time interval was provided by me .  I have reviewed this patient's available data, including medical history, events of note, physical examination, and all test results as part of my evaluation. I have personally reviewed and interpreted all radiology studies.   Dia Crawford, MD [phone] Pager

## 2015-03-06 LAB — GLUCOSE, CAPILLARY
GLUCOSE-CAPILLARY: 170 mg/dL — AB (ref 65–99)
GLUCOSE-CAPILLARY: 242 mg/dL — AB (ref 65–99)
GLUCOSE-CAPILLARY: 243 mg/dL — AB (ref 65–99)
GLUCOSE-CAPILLARY: 306 mg/dL — AB (ref 65–99)
Glucose-Capillary: 284 mg/dL — ABNORMAL HIGH (ref 65–99)
Glucose-Capillary: 148 mg/dL — ABNORMAL HIGH (ref 65–99)

## 2015-03-06 LAB — COMPREHENSIVE METABOLIC PANEL
ALBUMIN: 2.2 g/dL — AB (ref 3.5–5.0)
ALT: 88 U/L — ABNORMAL HIGH (ref 14–54)
ANION GAP: 10 (ref 5–15)
AST: 78 U/L — ABNORMAL HIGH (ref 15–41)
Alkaline Phosphatase: 49 U/L (ref 38–126)
BILIRUBIN TOTAL: 0.2 mg/dL — AB (ref 0.3–1.2)
BUN: 18 mg/dL (ref 6–20)
CO2: 21 mmol/L — ABNORMAL LOW (ref 22–32)
Calcium: 8.9 mg/dL (ref 8.9–10.3)
Chloride: 107 mmol/L (ref 101–111)
Creatinine, Ser: 0.64 mg/dL (ref 0.44–1.00)
GFR calc Af Amer: >60 mL/min (ref >60)
GFR calc non Af Amer: >60 mL/min (ref >60)
GLUCOSE: 300 mg/dL — AB (ref 65–99)
POTASSIUM: 4.2 mmol/L (ref 3.5–5.1)
Sodium: 138 mmol/L (ref 135–145)
TOTAL PROTEIN: 5.8 g/dL — AB (ref 6.5–8.1)

## 2015-03-06 LAB — CBC
HEMATOCRIT: 29.9 % — AB (ref 36.0–46.0)
HEMOGLOBIN: 10 g/dL — AB (ref 12.0–15.0)
MCH: 29.2 pg (ref 26.0–34.0)
MCHC: 33.4 g/dL (ref 30.0–36.0)
MCV: 87.4 fL (ref 78.0–100.0)
PLATELETS: 217 10*3/uL (ref 150–400)
RBC: 3.42 MIL/uL — AB (ref 3.87–5.11)
RDW: 15.1 % (ref 11.5–15.5)
WBC: 6.6 10*3/uL (ref 4.0–10.5)

## 2015-03-06 LAB — HEMOGLOBIN A1C
HEMOGLOBIN A1C: 7.2 % — AB (ref 4.8–5.6)
Mean Plasma Glucose: 160 mg/dL

## 2015-03-06 LAB — PROCALCITONIN: PROCALCITONIN: 0.64 ng/mL

## 2015-03-06 LAB — PHOSPHORUS: Phosphorus: 3.5 mg/dL (ref 2.5–4.6)

## 2015-03-06 LAB — MAGNESIUM: MAGNESIUM: 1.7 mg/dL (ref 1.7–2.4)

## 2015-03-06 MED ORDER — METOPROLOL TARTRATE 50 MG PO TABS
50.0000 mg | ORAL_TABLET | Freq: Two times a day (BID) | ORAL | Status: DC
  Administered 2015-03-06 – 2015-03-08 (×4): 50 mg via ORAL
  Filled 2015-03-06 (×4): qty 1

## 2015-03-06 MED ORDER — METOPROLOL TARTRATE 1 MG/ML IV SOLN
INTRAVENOUS | Status: AC
  Filled 2015-03-06: qty 10

## 2015-03-06 MED ORDER — METOPROLOL TARTRATE 1 MG/ML IV SOLN
7.5000 mg | Freq: Once | INTRAVENOUS | Status: AC
  Administered 2015-03-06: 7.5 mg via INTRAVENOUS

## 2015-03-06 MED ORDER — IBUPROFEN 800 MG PO TABS
800.0000 mg | ORAL_TABLET | Freq: Four times a day (QID) | ORAL | Status: DC | PRN
Start: 2015-03-06 — End: 2015-03-09
  Administered 2015-03-06: 800 mg via ORAL
  Filled 2015-03-06: qty 4

## 2015-03-06 MED ORDER — METOPROLOL TARTRATE 25 MG PO TABS
25.0000 mg | ORAL_TABLET | Freq: Two times a day (BID) | ORAL | Status: DC
  Administered 2015-03-06: 25 mg via ORAL
  Filled 2015-03-06: qty 1

## 2015-03-06 MED ORDER — INSULIN DETEMIR 100 UNIT/ML ~~LOC~~ SOLN
60.0000 [IU] | Freq: Every day | SUBCUTANEOUS | Status: DC
  Administered 2015-03-06 – 2015-03-08 (×3): 60 [IU] via SUBCUTANEOUS
  Filled 2015-03-06 (×4): qty 0.6

## 2015-03-06 MED ORDER — INSULIN ASPART 100 UNIT/ML ~~LOC~~ SOLN
18.0000 [IU] | Freq: Three times a day (TID) | SUBCUTANEOUS | Status: DC
  Administered 2015-03-06 – 2015-03-07 (×3): 18 [IU] via SUBCUTANEOUS

## 2015-03-06 MED ORDER — AMIODARONE HCL 200 MG PO TABS
200.0000 mg | ORAL_TABLET | Freq: Every day | ORAL | Status: DC
  Administered 2015-03-06 – 2015-03-07 (×2): 200 mg via ORAL
  Filled 2015-03-06 (×3): qty 1

## 2015-03-06 MED ORDER — ATORVASTATIN CALCIUM 20 MG PO TABS
20.0000 mg | ORAL_TABLET | Freq: Every day | ORAL | Status: DC
  Administered 2015-03-06 – 2015-03-11 (×6): 20 mg via ORAL
  Filled 2015-03-06 (×6): qty 1

## 2015-03-06 NOTE — Progress Notes (Signed)
[place] for Lovenox  Indication: atrial fibrillation  Patient Measurements: Height: 5' 3" (160 cm) Weight: 187 lb 6.4 oz (85.004 kg) IBW/kg (Calculated) : 52.4  Vital Signs: Temp: 97.8 F (36.6 C) (02/24 0500) Temp Source: Oral (02/24 0500) BP: 132/69 mmHg (02/24 0400) Pulse Rate: 107 (02/24 0400)  Labs:  Recent Labs  03/04/15 0628 03/05/15 0445 03/06/15 0400  HGB 10.1* 9.7* 10.0*  HCT 30.6* 30.8* 29.9*  PLT 171 215 217  CREATININE 0.81 0.74 0.64   Estimated Creatinine Clearance: 66.6 mL/min (by C-G formula based on Cr of 0.64).  Medical History: Past Medical History  Diagnosis Date  . Hypertension   . Diabetes mellitus without complication ([place])   . Hypercholesteremia   . Cancer [facility])    Assessment: 70 y/o F with 3 weeks of worsening dyspnea, found to be in afib/RVR, also with PNA per CXR also flu +.  CBC continues to be stable this am, renal function within normal limits, no anti-coagulation PTA. Plan was to continue lovenox until patient recovered more from pna/flu then transition to po anticoagulation.  Goal of Therapy:  Monitor platelets by anticoagulation protocol: Yes   Plan:  -Continue Lovenox80 mg subcutaneous q12h -Minimum q72h CBC while inpatient -Monitor for bleeding -Follow up converting to po AC  Erin Hearing PharmD., BCPS Clinical Pharmacist Pager [phone] 03/06/2015 9:39 AM

## 2015-03-06 NOTE — Significant Event (Signed)
Dr. Sherral Hammers notified patients HR remains elevated up to 170s while resting.  Patient put back in bed BP 140/91 HR 146.  IV Metoprolol ordered and given.  Patient denies any chest pain, or increased SOB. Will continue to monitor closely

## 2015-03-06 NOTE — Clinical Social Work Placement (Signed)
CLINICAL SOCIAL WORK PLACEMENT  NOTE  Date:  03/06/2015  Patient Details  Name: KADANCE MCCUISTION MRN: [id number] Date of Birth: 06/10/1945  Clinical Social Work is seeking post-discharge placement for this patient at the [place] level of care (*CSW will initial, date and re-position this form in  chart as items are completed):  Yes   Patient/family provided with [place] Work Department's list of facilities offering this level of care within the geographic area requested by the patient (or if unable, by the patient's family).  Yes   Patient/family informed of their freedom to choose among providers that offer the needed level of care, that participate in Medicare, Medicaid or managed care program needed by the patient, have an available bed and are willing to accept the patient.  Yes   Patient/family informed of [facility]'s ownership interest in [facility] and [facility], as well as of the fact that they are under no obligation to receive care at these facilities.  PASRR submitted to EDS on 03/06/15     PASRR number received on 03/06/15     Existing PASRR number confirmed on       FL2 transmitted to all facilities in geographic area requested by pt/family on 03/06/15     FL2 transmitted to all facilities within larger geographic area on       Patient informed that his/her managed care company has contracts with or will negotiate with certain facilities, including the following:            Patient/family informed of bed offers received.  Patient chooses bed at       Physician recommends and patient chooses bed at      Patient to be transferred to   on  .  Patient to be transferred to facility by       Patient family notified on   of transfer.  Name of family member notified:        PHYSICIAN Please sign FL2     Additional Comment:    _______________________________________________ Ross Ludwig, LCSWA 03/06/2015,  7:15 PM

## 2015-03-06 NOTE — Significant Event (Signed)
Dr. Sherral Hammers notified that patients HR remains elevated despite oral agents.  Patient resting HR 130-170.  New order placed, will continue to monitor

## 2015-03-06 NOTE — NC FL2 (Signed)
[place] LEVEL OF CARE SCREENING TOOL     IDENTIFICATION  Patient Name: Ellen Jordan Birthdate: September 03, 1945 Sex: female Admission Date (Current Location): 02/28/2015  [facility] and [place] Number:  [employer] and Address:  [facility], [place] [address] [place],  [address]      Provider Number: [id number]  Attending Physician Name and Address:  Allie Bossier, MD  Relative Name and Phone Number:  Beaux, [place] [phone] or  Torain,Tracey Daughter [phone] or [phone]    Current Level of Care: Hospital Recommended Level of Care: [place] Prior Approval Number:    Date Approved/Denied:   PASRR Number: [id number]  Discharge Plan: SNF    Current Diagnoses: Patient Active Problem List   Diagnosis Date Noted  . [name] (hyperlipidemia)   . Dehydration   . Sepsis due to pneumonia ([place])   . Respiratory failure ([place])   . CAP (community acquired pneumonia)   . Dyspnea   . Influenza with pneumonia   . Atrial fibrillation with rapid ventricular response ([place]) 02/28/2015  . Elevated troponin I level 02/28/2015  . Community acquired pneumonia 02/28/2015  . Acute respiratory failure with hypoxia ([place]) 02/28/2015  . Lactic acidosis 02/28/2015  . Sepsis ([place]) 02/28/2015  . Uncontrolled type 2 diabetes mellitus without complication, with long-term current use of insulin ([place]) 12/24/2014  . Hyperlipidemia 12/24/2014  . Essential hypertension, benign 12/24/2014    Orientation RESPIRATION BLADDER Height & Weight     Self, Time, Situation, Place    Incontinent Weight: 187 lb 6.4 oz (85.004 kg) Height:  _0  (160 cm)  BEHAVIORAL SYMPTOMS/MOOD NEUROLOGICAL BOWEL NUTRITION STATUS      Continent Diet (Cardiac Diet)  AMBULATORY STATUS COMMUNICATION OF NEEDS Skin   Limited Assist Verbally Normal                       Personal Care Assistance Level of Assistance  Bathing, Dressing Bathing  Assistance: Limited assistance   Dressing Assistance: Limited assistance     Functional Limitations Info             SPECIAL CARE FACTORS FREQUENCY  PT (By licensed PT)     PT Frequency: 5x a week              Contractures      Additional Factors Info  Allergies, Insulin Sliding Scale, Psychotropic   Allergies Info: PENICILLINS Psychotropic Info: Xanax and Zoloft Insulin Sliding Scale Info: 3 x a day       Current Medications (03/06/2015):  This is the current hospital active medication list Current Facility-Administered Medications  Medication Dose Route Frequency Provider Last Rate Last Dose  . acetylcysteine (MUCOMYST) 20 % nebulizer / oral solution 2 mL  2 mL Nebulization TID Allie Bossier, MD   2 mL at 03/06/15 1450  . ALPRAZolam [name]) tablet 0.5 mg  0.5 mg Oral QHS Allie Bossier, MD      . amiodarone (PACERONE) tablet 200 mg  200 mg Oral Daily Allie Bossier, MD   200 mg at 03/06/15 1523  . aspirin EC tablet 81 mg  81 mg Oral Daily Tanna Savoy Stinson, DO   81 mg at 03/06/15 5027  . atorvastatin (LIPITOR) tablet 20 mg  20 mg Oral q1800 Lyndee Leo, RPH   20 mg at 03/06/15 1735  . benzonatate (TESSALON) capsule 200 mg  200 mg Oral TID PRN Cherene Altes, MD  200 mg at 03/06/15 1402  . budesonide (PULMICORT) nebulizer solution 0.5 mg  0.5 mg Nebulization BID Jose Shirl Harris, MD   0.5 mg at 03/06/15 0910  . chlorpheniramine-HYDROcodone (TUSSIONEX) 10-8 MG/5ML suspension 5 mL  5 mL Oral Q12H PRN Cherene Altes, MD   5 mL at 03/06/15 1032  . dextromethorphan-guaiFENesin (MUCINEX DM) 30-600 MG per 12 hr tablet 1 tablet  1 tablet Oral BID Allie Bossier, MD   1 tablet at 03/06/15 [phone]  . digoxin (LANOXIN) tablet 0.125 mg  0.125 mg Oral Daily Allie Bossier, MD   0.125 mg at 03/06/15 3500  . enoxaparin (LOVENOX) injection 80 mg  1 mg/kg Subcutaneous Q12H Erenest Blank, RPH   80 mg at 03/06/15 9381  . ibuprofen (ADVIL,MOTRIN) tablet 800 mg  800 mg Oral Q6H  PRN Allie Bossier, MD   800 mg at 03/06/15 1524  . insulin aspart (novoLOG) injection 0-20 Units  0-20 Units Subcutaneous 6 times per day Allie Bossier, MD   3 Units at 03/06/15 1736  . insulin aspart (novoLOG) injection 0-5 Units  0-5 Units Subcutaneous QHS Truett Mainland, DO   3 Units at 03/05/15 2130  . insulin aspart (novoLOG) injection 18 Units  18 Units Subcutaneous TID AC Allie Bossier, MD   18 Units at 03/06/15 1735  . insulin detemir (LEVEMIR) injection 60 Units  60 Units Subcutaneous QHS Allie Bossier, MD      . ipratropium (ATROVENT) nebulizer solution 0.5 mg  0.5 mg Nebulization TID Allie Bossier, MD   0.5 mg at 03/06/15 1450  . levalbuterol (XOPENEX) nebulizer solution 0.63 mg  0.63 mg Nebulization Q3H PRN Rahul P Desai, PA-C   0.63 mg at 03/06/15 1450  . methylPREDNISolone sodium succinate (SOLU-MEDROL) 40 mg/mL injection 30 mg  30 mg Intravenous Q24H Allie Bossier, MD   30 mg at 03/06/15 1734  . metoprolol (LOPRESSOR) 1 MG/ML injection           . metoprolol (LOPRESSOR) tablet 50 mg  50 mg Oral BID Allie Bossier, MD      . morphine 2 MG/ML injection 1-2 mg  1-2 mg Intravenous Q3H PRN Rahul P Desai, PA-C   2 mg at 03/03/15 1634  . polyethylene glycol (MIRALAX / GLYCOLAX) packet 17 g  17 g Oral Daily Allie Bossier, MD   17 g at 03/06/15 [phone]  . sertraline (ZOLOFT) tablet 100 mg  100 mg Oral Daily Tanna Savoy Stinson, DO   100 mg at 03/06/15 3716     Discharge Medications: Please see discharge summary for a list of discharge medications.  Relevant Imaging Results:  Relevant Lab Results:   Additional Information SSN [id number]    Ross Ludwig, [place]

## 2015-03-06 NOTE — Progress Notes (Signed)
[place] TEAM 1 - Stepdown/ICU TEAM Progress Note  Ellen Jordan ATF:[id number] DOB: 03-28-1945 DOA: 02/28/2015 PCP: Robert Bellow, MD  Admit HPI / Brief Narrative: 70 y.o. WF PMHx DM Type 2, HTN, [name]   Presented with 3 weeks of worsening dyspnea on exertion w/ a productive cough and fever  HPI/Subjective: 2/24  A/O 4, sitting in chair eating lunch. Patient feeling significantly improved.     Assessment/Plan: Sepsis pneumonia/Bibasilar positiveInfluenza B PNA w/ acute hypoxic respiratory failure  -Cont Tamiflu and Levaquin for possible bacterial PNA superinfection, complete 7 day course -Physiotherapy vest BID -Decrease Solu-Medrol 30 mg daily -Mucinex DM BID -Continue flutter valve  New onset Afib w/ RVR -Rate still not fully controlled  - TSH normal  -Strict in and out since admission +2.3 L -Daily a.m. Weight admission= 80.2 kg                2/24 weight bed= 85 kg -Echocardiogram pending -DC Cardizem drip Start amiodarone 200 mg daily -Increase Metoprolol 50 mg BID - start Digoxin 0.125 mg daily  Elevated troponin -Peaked at 0.3 - explained by her RVR   Mod Hypophosphatemia  -Resolved  Hypokalemia  -Potassium goal > 4  -  Magnesium -Magnesium goal> 2  HTN -BP currently controlled  DM Type 2 uncontrolled with complications -CBG reasonably controlled  -Increase to home dose Levemir 60 units QHS -Increase NovoLog 18 units QAC -Continue resistant SSI -2/23 Hemoglobin A1c= 7.2 -Although patient's hemoglobin A1c slightly above ADA guidelines studies have shown trying to treat to hemoglobin A1c <7 in older patients has increased mortality   [name] -Lipid panel pending      Code Status: FULL Family Communication: Daughter, Husband present at time of exam Disposition Plan: Resolution sepsis    Consultants: Dr.Murali Chase Caller [facility] M  Procedure/Significant Events: Echocardiogram pending   Culture 2/18 blood right hand/left AC NEGATIVE  FINAL 2/18 MRSA by PCR negative 2/18 positive influenza B 2/18 respiratory virus panel positive influenza B 2/19 HIV negative 2/23 sputum pending   Antibiotics: Aztreonam 2/18 > 2/19 Vanc 2/18 > 2/19 Levaquin 2/18 >  Tamiflu 2/18 >  DVT prophylaxis: Full dose Lovenox   Devices    LINES / TUBES:      Continuous Infusions: . sodium chloride 50 mL/hr at 03/06/15 0008  . diltiazem (CARDIZEM) infusion 15 mg/hr (03/06/15 0618)    Objective: VITAL SIGNS: Temp: 97.8 F (36.6 C) (02/24 0500) Temp Source: Oral (02/24 0500) BP: 132/69 mmHg (02/24 0400) Pulse Rate: 107 (02/24 0400) SPO2; FIO2:   Intake/Output Summary (Last 24 hours) at 03/06/15 2706 Last data filed at 03/06/15 0700  Gross per 24 hour  Intake   2365 ml  Output   4075 ml  Net  -1710 ml     Exam: General: A/O 4, much more alert today sitting in chair comfortably talking with family, negative acute respiratory distress  Eyes: Negative headache, negative scleral hemorrhage ENT: Negative Runny nose, negative gingival bleeding, Neck:  Negative scars, masses, torticollis, lymphadenopathy, JVD Lungs: mild crackles/rhonchi bibasilar, negative wheezes  Cardiovascular: Irregular irregular rhythm and rate without murmur gallop or rub normal S1 and S2 Abdomen:negative abdominal pain, nondistended, positive soft, bowel sounds, no rebound, no ascites, no appreciable mass Extremities: No significant cyanosis, clubbing, or edema bilateral lower extremities Psychiatric:  Negative depression, negative anxiety, negative fatigue, negative mania  Neurologic:  Cranial nerves II through XII intact, tongue/uvula midline, all extremities muscle strength 5/5, sensation intact throughout,  negative dysarthria, negative expressive aphasia, negative  receptive aphasia.   Data Reviewed: Basic Metabolic Panel:  Recent Labs Lab 03/02/15 0345  03/02/15 1749 03/02/15 2133 03/03/15 0215 03/04/15 0628 03/05/15 0445  03/06/15 0400  NA 137  --  135  --  137 136 140 138  K 3.4*  < > 3.6  --  4.0 4.2 4.2 4.2  CL 110  --  110  --  113* 110 108 107  CO2 18*  --  17*  --  18* 19* 21* 21*  GLUCOSE 149*  --  131*  --  187* 256* 291* 300*  BUN 16  --  17  --  _0 CREATININE 0.89  --  0.85  --  0.82 0.81 0.74 0.64  CALCIUM 8.3*  --  7.9*  --  8.3* 8.5* 8.8* 8.9  MG 1.7  --   --   --   --  1.9 1.8 1.7  PHOS 1.8*  --   --  2.3* 2.1* 3.1 3.0 3.5  < > = values in this interval not displayed. Liver Function Tests:  Recent Labs Lab 03/02/15 0345 03/03/15 0215 03/04/15 0628 03/05/15 0445 03/06/15 0400  AST 33 46* 44* 75* 78*  ALT 29 39 47 75* 88*  ALKPHOS 45 66 58 58 49  BILITOT 0.6 0.5 0.6 0.4 0.2*  PROT 5.8* 6.1* 6.2* 6.3* 5.8*  ALBUMIN 2.5* 2.3* 2.3* 2.3* 2.2*   No results for input(s): LIPASE, AMYLASE in the last 168 hours. No results for input(s): AMMONIA in the last 168 hours. CBC:  Recent Labs Lab 03/02/15 0345 03/03/15 0215 03/04/15 0628 03/05/15 0445 03/06/15 0400  WBC 8.3 8.3 6.3 8.0 6.6  HGB 9.7* 9.4* 10.1* 9.7* 10.0*  HCT 30.1* 30.1* 30.6* 30.8* 29.9*  MCV 85.8 88.0 87.7 86.5 87.4  PLT 167 183 171 215 217   Cardiac Enzymes:  Recent Labs Lab 02/28/15 1418 03/01/15 0350 03/01/15 0351 03/01/15 0914  TROPONINI 0.04* 0.11* 0.30* 0.08*   BNP (last 3 results)  Recent Labs  02/28/15 1418 03/01/15 0351  BNP 375.0* 408.7*    ProBNP (last 3 results) No results for input(s): PROBNP in the last 8760 hours.  CBG:  Recent Labs Lab 03/05/15 0802 03/05/15 1211 03/05/15 1712 03/05/15 2106 03/06/15 0507  GLUCAP 272* 301* 278* 267* 306*    Recent Results (from the past 240 hour(s))  Blood culture (routine x 2)     Status: None   Collection Time: 02/28/15  2:38 PM  Result Value Ref Range Status   Specimen Description BLOOD RIGHT HAND  Final   Special Requests BOTTLES DRAWN AEROBIC AND ANAEROBIC [place]  Final   Culture NO GROWTH 5 DAYS  Final   Report Status  03/05/2015 FINAL  Final  Blood culture (routine x 2)     Status: None   Collection Time: 02/28/15  2:44 PM  Result Value Ref Range Status   Specimen Description BLOOD LEFT ANTECUBITAL  Final   Special Requests BOTTLES DRAWN AEROBIC AND ANAEROBIC [place]  Final   Culture NO GROWTH 5 DAYS  Final   Report Status 03/05/2015 FINAL  Final  Respiratory virus panel     Status: Abnormal   Collection Time: 02/28/15  6:04 PM  Result Value Ref Range Status   Respiratory Syncytial Virus A Negative Negative Final   Respiratory Syncytial Virus B Negative Negative Final   Influenza A Negative Negative Final   Influenza B Positive (A) Negative Final   Parainfluenza 1 Negative Negative Final  Parainfluenza 2 Negative Negative Final   Parainfluenza 3 Negative Negative Final   Metapneumovirus Negative Negative Final   Rhinovirus Negative Negative Final   Adenovirus Negative Negative Final    Comment: (NOTE) Performed At: [facility] [place], [place] [id number] Lindon Romp MD [id number]   MRSA PCR Screening     Status: None   Collection Time: 02/28/15  9:23 PM  Result Value Ref Range Status   MRSA by PCR NEGATIVE NEGATIVE Final    Comment:        The GeneXpert MRSA Assay (FDA approved for NASAL specimens only), is one component of a comprehensive MRSA colonization surveillance program. It is not intended to diagnose MRSA infection nor to guide or monitor treatment for MRSA infections.   MRSA PCR Screening     Status: None   Collection Time: 03/02/15 12:29 PM  Result Value Ref Range Status   MRSA by PCR NEGATIVE NEGATIVE Final    Comment:        The GeneXpert MRSA Assay (FDA approved for NASAL specimens only), is one component of a comprehensive MRSA colonization surveillance program. It is not intended to diagnose MRSA infection nor to guide or monitor treatment for MRSA infections.   Culture, sputum-assessment     Status: None   Collection Time:  03/05/15  9:31 AM  Result Value Ref Range Status   Specimen Description SPUTUM  Final   Special Requests Normal  Final   Sputum evaluation   Final    THIS SPECIMEN IS ACCEPTABLE. RESPIRATORY CULTURE REPORT TO FOLLOW.   Report Status 03/05/2015 FINAL  Final     Studies:  Recent x-ray studies have been reviewed in detail by the Attending Physician  Scheduled Meds:  Scheduled Meds: . acetylcysteine  2 mL Nebulization TID  . ALPRAZolam  0.5 mg Oral QHS  . aspirin EC  81 mg Oral Daily  . budesonide (PULMICORT) nebulizer solution  0.5 mg Nebulization BID  . dextromethorphan-guaiFENesin  1 tablet Oral BID  . digoxin  0.125 mg Oral Daily  . enoxaparin (LOVENOX) injection  1 mg/kg Subcutaneous Q12H  . insulin aspart  0-20 Units Subcutaneous 6 times per day  . insulin aspart  0-5 Units Subcutaneous QHS  . insulin aspart  12 Units Subcutaneous TID AC  . insulin detemir  40 Units Subcutaneous QHS  . ipratropium  0.5 mg Nebulization TID  . methylPREDNISolone (SOLU-MEDROL) injection  30 mg Intravenous Q24H  . metoprolol tartrate  12.5 mg Oral BID  . polyethylene glycol  17 g Oral Daily  . sertraline  100 mg Oral Daily  . simvastatin  40 mg Oral Daily    Time spent on care of this patient: 40 mins   Carlie Solorzano, Geraldo Docker , MD  [facility] Office  [phone] Pager [phone]  On-Call/Text Page:      [email or web address]      password [email or web address]  If 7PM-7AM, please contact night-coverage [email or web address] Password [email or web address] 03/06/2015, 8:12 AM   LOS: 6 days   Care during the described time interval was provided by me .  I have reviewed this patient's available data, including medical history, events of note, physical examination, and all test results as part of my evaluation. I have personally reviewed and interpreted all radiology studies.   Dia Crawford, MD [phone] Pager

## 2015-03-06 NOTE — Clinical Social Work Note (Signed)
Clinical Social Work Assessment  Patient Details  Name: Ellen Jordan MRN: [id number] Date of Birth: [birth year]  Date of referral:  03/06/15               Reason for consult:  Facility Placement                Permission sought to share information with:  Family Supports, Customer service manager Permission granted to share information::  Yes, Verbal Permission Granted  Name::     Kaylynne, Andres [phone] or Torain,Tracey Daughter [phone] or [phone]  Agency::  SNF admissions  Relationship::     Contact Information:     Housing/Transportation Living arrangements for the past 2 months:  Single Family Home Source of Information:  Patient, Adult Children, Spouse Patient Interpreter Needed:  None Criminal Activity/Legal Involvement Pertinent to Current Situation/Hospitalization:  No - Comment as needed Significant Relationships:  Adult Children, Spouse Lives with:  Spouse Do you feel safe going back to the place where you live?  No (Patient feels she needs some short term rehab before she is able to return home.) Need for family participation in patient care:  Yes (Comment)  Care giving concerns:  Patient needs to go to SNF for short term rehab.   Social Worker assessment / plan:  Patient is a 69 year old married female who lives with her husband.  Patient is alert and oriented x4 and able to make her own decisions.  Patient's daughter and husband were at bedside, patient states she has never been to SNF for rehab.  CSW explained to patient and her family about what to expect and the role that CSW has in trying to find placement for patient to go to SNF.  CSW spoke with patient and her family about process for bed placement search and how insurance will pay for her stay.  Patient and her family expressed they would like patient to go to [facility] if possible, CSW contacted [facility] and they said they do take her insurance.  Patient and family had questions  regarding their hospital copays CSW informed them to speak to the financial assistance department at hospital and they can help find out information.  Employment status:  Retired Nurse, adult PT Recommendations:  [place] / Referral to community resources:     Patient/Family's Response to care:  Patient and family agreeable to going to SNF for short term rehab. Patient/Family's Understanding of and Emotional Response to Diagnosis, Current Treatment, and Prognosis: Patient and her family are aware of current treatment plan and prognosis.  Emotional Assessment Appearance:  Appears stated age Attitude/Demeanor/Rapport:    Affect (typically observed):  Appropriate, Calm, Stable, Pleasant Orientation:  Oriented to Self, Oriented to Place, Oriented to  Time, Oriented to Situation Alcohol / Substance use:  Not Applicable Psych involvement (Current and /or in the community):  No (Comment)  Discharge Needs  Concerns to be addressed:  No discharge needs identified Readmission within the last 30 days:  No Current discharge risk:  None Barriers to Discharge:  No Barriers Identified   Anell Barr 03/06/2015, 6:56 PM

## 2015-03-07 LAB — CBC
HCT: 31.4 % — ABNORMAL LOW (ref 36.0–46.0)
HEMOGLOBIN: 10 g/dL — AB (ref 12.0–15.0)
MCH: 27.4 pg (ref 26.0–34.0)
MCHC: 31.8 g/dL (ref 30.0–36.0)
MCV: 86 fL (ref 78.0–100.0)
Platelets: 270 10*3/uL (ref 150–400)
RBC: 3.65 MIL/uL — AB (ref 3.87–5.11)
RDW: 14.6 % (ref 11.5–15.5)
WBC: 8.5 10*3/uL (ref 4.0–10.5)

## 2015-03-07 LAB — GLUCOSE, CAPILLARY
GLUCOSE-CAPILLARY: 156 mg/dL — AB (ref 65–99)
GLUCOSE-CAPILLARY: 179 mg/dL — AB (ref 65–99)
GLUCOSE-CAPILLARY: 318 mg/dL — AB (ref 65–99)
Glucose-Capillary: 228 mg/dL — ABNORMAL HIGH (ref 65–99)
Glucose-Capillary: 269 mg/dL — ABNORMAL HIGH (ref 65–99)
Glucose-Capillary: 232 mg/dL — ABNORMAL HIGH (ref 65–99)

## 2015-03-07 LAB — COMPREHENSIVE METABOLIC PANEL
ALT: 116 U/L — ABNORMAL HIGH (ref 14–54)
ANION GAP: 11 (ref 5–15)
AST: 97 U/L — ABNORMAL HIGH (ref 15–41)
Albumin: 2.5 g/dL — ABNORMAL LOW (ref 3.5–5.0)
Alkaline Phosphatase: 56 U/L (ref 38–126)
BUN: 18 mg/dL (ref 6–20)
CALCIUM: 9 mg/dL (ref 8.9–10.3)
CHLORIDE: 107 mmol/L (ref 101–111)
CO2: 23 mmol/L (ref 22–32)
Creatinine, Ser: 0.83 mg/dL (ref 0.44–1.00)
GFR calc non Af Amer: >60 mL/min (ref >60)
Glucose, Bld: 280 mg/dL — ABNORMAL HIGH (ref 65–99)
Potassium: 4.3 mmol/L (ref 3.5–5.1)
SODIUM: 141 mmol/L (ref 135–145)
Total Bilirubin: 0.4 mg/dL (ref 0.3–1.2)
Total Protein: 6.2 g/dL — ABNORMAL LOW (ref 6.5–8.1)

## 2015-03-07 LAB — MAGNESIUM: MAGNESIUM: 1.6 mg/dL — AB (ref 1.7–2.4)

## 2015-03-07 LAB — DIGOXIN LEVEL: Digoxin Level: 0.3 ng/mL — ABNORMAL LOW (ref 0.8–2.0)

## 2015-03-07 LAB — PHOSPHORUS: PHOSPHORUS: 4 mg/dL (ref 2.5–4.6)

## 2015-03-07 MED ORDER — AMIODARONE HCL IN DEXTROSE 360-4.14 MG/200ML-% IV SOLN
30.0000 mg/h | INTRAVENOUS | Status: DC
  Administered 2015-03-08: 30 mg/h via INTRAVENOUS
  Filled 2015-03-07: qty 200

## 2015-03-07 MED ORDER — MAGNESIUM CHLORIDE 64 MG PO TBEC
2.0000 | DELAYED_RELEASE_TABLET | Freq: Once | ORAL | Status: AC
  Administered 2015-03-07: 128 mg via ORAL
  Filled 2015-03-07: qty 2

## 2015-03-07 MED ORDER — AMIODARONE HCL IN DEXTROSE 360-4.14 MG/200ML-% IV SOLN
60.0000 mg/h | INTRAVENOUS | Status: DC
  Administered 2015-03-07 (×2): 60 mg/h via INTRAVENOUS
  Filled 2015-03-07: qty 200

## 2015-03-07 MED ORDER — MENTHOL 3 MG MT LOZG
1.0000 | LOZENGE | OROMUCOSAL | Status: DC | PRN
  Filled 2015-03-07: qty 9

## 2015-03-07 MED ORDER — INSULIN ASPART 100 UNIT/ML ~~LOC~~ SOLN
24.0000 [IU] | Freq: Three times a day (TID) | SUBCUTANEOUS | Status: DC
  Administered 2015-03-07 – 2015-03-09 (×6): 24 [IU] via SUBCUTANEOUS

## 2015-03-07 MED ORDER — AMIODARONE LOAD VIA INFUSION
150.0000 mg | Freq: Once | INTRAVENOUS | Status: AC
  Administered 2015-03-07: 150 mg via INTRAVENOUS
  Filled 2015-03-07: qty 83.34

## 2015-03-07 MED ORDER — AMIODARONE HCL 200 MG PO TABS
300.0000 mg | ORAL_TABLET | Freq: Two times a day (BID) | ORAL | Status: DC
  Administered 2015-03-07: 300 mg via ORAL

## 2015-03-07 NOTE — Progress Notes (Signed)
Pt given scheduled meds, Metoprolol, digoxin, amio. Dr Sherral Hammers on unit notified pt is in Afib RVR with a rate 120's - 140's. Advised to page him if no change.

## 2015-03-07 NOTE — Progress Notes (Signed)
Notified dr Sherral Hammers pts heart rate still in 130's, after amio bolus and gtt. No new orders, cont to monitor.

## 2015-03-07 NOTE — Progress Notes (Signed)
Pt is afib rate of 129-139. Text page and call paged Dr Clydene Laming, awaiting return call.

## 2015-03-07 NOTE — Progress Notes (Addendum)
[place] TEAM 1 - Stepdown/ICU TEAM Progress Note  JACOLE CAPLEY DXI:[id number] DOB: 13-Aug-1945 DOA: 02/28/2015 PCP: Robert Bellow, MD  Admit HPI / Brief Narrative: 70 y.o. WF PMHx DM Type 2, HTN, [name]   Presented with 3 weeks of worsening dyspnea on exertion w/ a productive cough and fever  HPI/Subjective: 2/25  A/O 4, NAD.     Assessment/Plan: Sepsis pneumonia/Bibasilar positiveInfluenza B PNA w/ acute hypoxic respiratory failure  -Completed Tamiflu and Levaquin  -Physiotherapy vest BID -Decrease Solu-Medrol 30 mg daily -Mucinex DM BID -Continue flutter valve  New onset Afib w/ RVR -Rate still not controlled  - TSH normal  -Strict in and out since admission +2.3 L -Daily a.m. Weight admission= 80.2 kg                2/24 weight bed= 85 kg -Echocardiogram pending -Increase Amiodarone 300 mg BID -Increase Metoprolol 50 mg BID - Continue Digoxin 0.125 mg daily -Digoxin level pending ADDENDUM; Pt's rate still not controlled D/C PO Amiodarone for now and restart Amiodarone gtt +Bolus. If still uncontrollable in Am may need contact Cardiology for Cardioversion   Elevated troponin -Peaked at 0.3 - explained by her RVR   Mod Hypophosphatemia  -Resolved  Hypokalemia  -Potassium goal > 4   Magnesium -Magnesium goal> 2 --Slow-Mag 128 mg   HTN -BP currently controlled  DM Type 2 uncontrolled with complications -CBG reasonably controlled  -Increase to home dose Levemir 60 units QHS -Increase NovoLog 24 units QAC -Continue resistant SSI -2/23 Hemoglobin A1c= 7.2 -Although patient's hemoglobin A1c slightly above ADA guidelines studies have shown trying to treat to hemoglobin A1c <7 in older patients has increased mortality   [name] -Lipid panel pending      Code Status: FULL Family Communication: Husband present at time of exam Disposition Plan: Resolution sepsis    Consultants: Dr.Murali Chase Caller [facility] M  Procedure/Significant Events: 2/23  Echocardiogram;Left ventricle: mild Concentric hypertrophy. -LVEF= 50% to 55%. - Left atrium: mildly dilated.    Culture 2/18 blood right hand/left AC NEGATIVE FINAL 2/18 MRSA by PCR negative 2/18 positive influenza B 2/18 respiratory virus panel positive influenza B 2/19 HIV negative 2/23 sputum pending   Antibiotics: Aztreonam 2/18 > 2/19 Vanc 2/18 > 2/19 Levaquin 2/18 > 2/23 Tamiflu 2/18 > 2/23   DVT prophylaxis: Full dose Lovenox   Devices    LINES / TUBES:      Continuous Infusions:    Objective: VITAL SIGNS: Temp: 98 F (36.7 C) (02/25 0400) Temp Source: Oral (02/25 0400) BP: 149/68 mmHg (02/25 0835) Pulse Rate: 140 (02/25 0835) SPO2; FIO2:   Intake/Output Summary (Last 24 hours) at 03/07/15 0845 Last data filed at 03/06/15 2025  Gross per 24 hour  Intake   1725 ml  Output   2150 ml  Net   -425 ml     Exam: General: A/O 4, NAD, negative acute respiratory distress  Eyes: Negative headache, negative scleral hemorrhage ENT: Negative Runny nose, negative gingival bleeding, Neck:  Negative scars, masses, torticollis, lymphadenopathy, JVD Lungs: mild crackles/rhonchi bibasilar, negative wheezes  Cardiovascular: Irregular irregular rhythm and rate without murmur gallop or rub normal S1 and S2 Abdomen:negative abdominal pain, nondistended, positive soft, bowel sounds, no rebound, no ascites, no appreciable mass Extremities: No significant cyanosis, clubbing, or edema bilateral lower extremities Psychiatric:  Negative depression, negative anxiety, negative fatigue, negative mania  Neurologic:  Cranial nerves II through XII intact, tongue/uvula midline, all extremities muscle strength 5/5, sensation intact throughout,  negative dysarthria, negative  expressive aphasia, negative receptive aphasia.   Data Reviewed: Basic Metabolic Panel:  Recent Labs Lab 03/02/15 0345  03/03/15 0215 03/04/15 0628 03/05/15 0445 03/06/15 0400 03/07/15 0259  NA  137  < > 137 136 140 138 141  K 3.4*  < > 4.0 4.2 4.2 4.2 4.3  CL 110  < > 113* 110 108 107 107  CO2 18*  < > 18* 19* 21* 21* 23  GLUCOSE 149*  < > 187* 256* 291* 300* 280*  BUN 16  < > _0 CREATININE 0.89  < > 0.82 0.81 0.74 0.64 0.83  CALCIUM 8.3*  < > 8.3* 8.5* 8.8* 8.9 9.0  MG 1.7  --   --  1.9 1.8 1.7 1.6*  PHOS 1.8*  < > 2.1* 3.1 3.0 3.5 4.0  < > = values in this interval not displayed. Liver Function Tests:  Recent Labs Lab 03/03/15 0215 03/04/15 0628 03/05/15 0445 03/06/15 0400 03/07/15 0259  AST 46* 44* 75* 78* 97*  ALT 39 47 75* 88* 116*  ALKPHOS 66 58 58 49 56  BILITOT 0.5 0.6 0.4 0.2* 0.4  PROT 6.1* 6.2* 6.3* 5.8* 6.2*  ALBUMIN 2.3* 2.3* 2.3* 2.2* 2.5*   No results for input(s): LIPASE, AMYLASE in the last 168 hours. No results for input(s): AMMONIA in the last 168 hours. CBC:  Recent Labs Lab 03/03/15 0215 03/04/15 0628 03/05/15 0445 03/06/15 0400 03/07/15 0259  WBC 8.3 6.3 8.0 6.6 8.5  HGB 9.4* 10.1* 9.7* 10.0* 10.0*  HCT 30.1* 30.6* 30.8* 29.9* 31.4*  MCV 88.0 87.7 86.5 87.4 86.0  PLT 183 171 215 217 270   Cardiac Enzymes:  Recent Labs Lab 02/28/15 1418 03/01/15 0350 03/01/15 0351 03/01/15 0914  TROPONINI 0.04* 0.11* 0.30* 0.08*   BNP (last 3 results)  Recent Labs  02/28/15 1418 03/01/15 0351  BNP 375.0* 408.7*    ProBNP (last 3 results) No results for input(s): PROBNP in the last 8760 hours.  CBG:  Recent Labs Lab 03/06/15 1144 03/06/15 1657 03/06/15 2000 03/06/15 2345 03/07/15 0447  GLUCAP 284* 148* 170* 318* 228*    Recent Results (from the past 240 hour(s))  Blood culture (routine x 2)     Status: None   Collection Time: 02/28/15  2:38 PM  Result Value Ref Range Status   Specimen Description BLOOD RIGHT HAND  Final   Special Requests BOTTLES DRAWN AEROBIC AND ANAEROBIC [place]  Final   Culture NO GROWTH 5 DAYS  Final   Report Status 03/05/2015 FINAL  Final  Blood culture (routine x 2)     Status:  None   Collection Time: 02/28/15  2:44 PM  Result Value Ref Range Status   Specimen Description BLOOD LEFT ANTECUBITAL  Final   Special Requests BOTTLES DRAWN AEROBIC AND ANAEROBIC [place]  Final   Culture NO GROWTH 5 DAYS  Final   Report Status 03/05/2015 FINAL  Final  Respiratory virus panel     Status: Abnormal   Collection Time: 02/28/15  6:04 PM  Result Value Ref Range Status   Respiratory Syncytial Virus A Negative Negative Final   Respiratory Syncytial Virus B Negative Negative Final   Influenza A Negative Negative Final   Influenza B Positive (A) Negative Final   Parainfluenza 1 Negative Negative Final   Parainfluenza 2 Negative Negative Final   Parainfluenza 3 Negative Negative Final   Metapneumovirus Negative Negative Final   Rhinovirus Negative Negative Final   Adenovirus Negative Negative  Final    Comment: (NOTE) Performed At: [facility] [place], [place] [id number] Lindon Romp MD YN:[id number]   MRSA PCR Screening     Status: None   Collection Time: 02/28/15  9:23 PM  Result Value Ref Range Status   MRSA by PCR NEGATIVE NEGATIVE Final    Comment:        The GeneXpert MRSA Assay (FDA approved for NASAL specimens only), is one component of a comprehensive MRSA colonization surveillance program. It is not intended to diagnose MRSA infection nor to guide or monitor treatment for MRSA infections.   MRSA PCR Screening     Status: None   Collection Time: 03/02/15 12:29 PM  Result Value Ref Range Status   MRSA by PCR NEGATIVE NEGATIVE Final    Comment:        The GeneXpert MRSA Assay (FDA approved for NASAL specimens only), is one component of a comprehensive MRSA colonization surveillance program. It is not intended to diagnose MRSA infection nor to guide or monitor treatment for MRSA infections.   Culture, sputum-assessment     Status: None   Collection Time: 03/05/15  9:31 AM  Result Value Ref Range Status   Specimen  Description SPUTUM  Final   Special Requests Normal  Final   Sputum evaluation   Final    THIS SPECIMEN IS ACCEPTABLE. RESPIRATORY CULTURE REPORT TO FOLLOW.   Report Status 03/05/2015 FINAL  Final     Studies:  Recent x-ray studies have been reviewed in detail by the Attending Physician  Scheduled Meds:  Scheduled Meds: . acetylcysteine  2 mL Nebulization TID  . ALPRAZolam  0.5 mg Oral QHS  . amiodarone  200 mg Oral Daily  . aspirin EC  81 mg Oral Daily  . atorvastatin  20 mg Oral q1800  . budesonide (PULMICORT) nebulizer solution  0.5 mg Nebulization BID  . dextromethorphan-guaiFENesin  1 tablet Oral BID  . digoxin  0.125 mg Oral Daily  . enoxaparin (LOVENOX) injection  1 mg/kg Subcutaneous Q12H  . insulin aspart  0-20 Units Subcutaneous 6 times per day  . insulin aspart  0-5 Units Subcutaneous QHS  . insulin aspart  18 Units Subcutaneous TID AC  . insulin detemir  60 Units Subcutaneous QHS  . ipratropium  0.5 mg Nebulization TID  . methylPREDNISolone (SOLU-MEDROL) injection  30 mg Intravenous Q24H  . metoprolol tartrate  50 mg Oral BID  . polyethylene glycol  17 g Oral Daily  . sertraline  100 mg Oral Daily    Time spent on care of this patient: 40 mins   WOODS, Geraldo Docker , MD  [facility] Office  [phone] Pager [phone]  On-Call/Text Page:      [email or web address]      password [email or web address]  If 7PM-7AM, please contact night-coverage [email or web address] Password [email or web address] 03/07/2015, 8:45 AM   LOS: 7 days   Care during the described time interval was provided by me .  I have reviewed this patient's available data, including medical history, events of note, physical examination, and all test results as part of my evaluation. I have personally reviewed and interpreted all radiology studies.   Dia Crawford, MD [phone] Pager

## 2015-03-08 ENCOUNTER — Inpatient Hospital Stay (HOSPITAL_COMMUNITY): Payer: Commercial Managed Care - HMO

## 2015-03-08 LAB — GLUCOSE, CAPILLARY
GLUCOSE-CAPILLARY: 125 mg/dL — AB (ref 65–99)
GLUCOSE-CAPILLARY: 137 mg/dL — AB (ref 65–99)
GLUCOSE-CAPILLARY: 139 mg/dL — AB (ref 65–99)
GLUCOSE-CAPILLARY: 161 mg/dL — AB (ref 65–99)
Glucose-Capillary: 91 mg/dL (ref 65–99)

## 2015-03-08 LAB — COMPREHENSIVE METABOLIC PANEL
ALK PHOS: 51 U/L (ref 38–126)
ALT: 89 U/L — ABNORMAL HIGH (ref 14–54)
ANION GAP: 11 (ref 5–15)
AST: 49 U/L — ABNORMAL HIGH (ref 15–41)
Albumin: 2.6 g/dL — ABNORMAL LOW (ref 3.5–5.0)
BUN: 18 mg/dL (ref 6–20)
CALCIUM: 9 mg/dL (ref 8.9–10.3)
CO2: 25 mmol/L (ref 22–32)
Chloride: 105 mmol/L (ref 101–111)
Creatinine, Ser: 0.84 mg/dL (ref 0.44–1.00)
Glucose, Bld: 171 mg/dL — ABNORMAL HIGH (ref 65–99)
Potassium: 4.2 mmol/L (ref 3.5–5.1)
SODIUM: 141 mmol/L (ref 135–145)
TOTAL PROTEIN: 6.2 g/dL — AB (ref 6.5–8.1)
Total Bilirubin: 0.2 mg/dL — ABNORMAL LOW (ref 0.3–1.2)

## 2015-03-08 LAB — CULTURE, RESPIRATORY

## 2015-03-08 LAB — CULTURE, RESPIRATORY W GRAM STAIN: Culture: NORMAL

## 2015-03-08 LAB — MAGNESIUM: Magnesium: 1.7 mg/dL (ref 1.7–2.4)

## 2015-03-08 MED ORDER — MAGNESIUM OXIDE 400 (241.3 MG) MG PO TABS
400.0000 mg | ORAL_TABLET | Freq: Once | ORAL | Status: AC
  Administered 2015-03-08: 400 mg via ORAL
  Filled 2015-03-08: qty 1

## 2015-03-08 MED ORDER — METOPROLOL TARTRATE 50 MG PO TABS: 50.0000 mg | ORAL_TABLET | Freq: Two times a day (BID) | ORAL | Status: DC

## 2015-03-08 MED ORDER — METOPROLOL TARTRATE 1 MG/ML IV SOLN
7.5000 mg | Freq: Once | INTRAVENOUS | Status: AC
  Administered 2015-03-08: 7.5 mg via INTRAVENOUS
  Filled 2015-03-08: qty 10

## 2015-03-08 MED ORDER — IPRATROPIUM BROMIDE 0.02 % IN SOLN: 0.5000 mg | RESPIRATORY_TRACT | Status: DC | PRN

## 2015-03-08 MED ORDER — DIGOXIN 250 MCG PO TABS
0.2500 mg | ORAL_TABLET | Freq: Every day | ORAL | Status: DC
  Administered 2015-03-08 – 2015-03-09 (×2): 0.25 mg via ORAL
  Filled 2015-03-08 (×3): qty 1

## 2015-03-08 MED ORDER — METOPROLOL TARTRATE 25 MG PO TABS
25.0000 mg | ORAL_TABLET | Freq: Four times a day (QID) | ORAL | Status: DC
  Administered 2015-03-08 – 2015-03-12 (×16): 25 mg via ORAL
  Filled 2015-03-08 (×16): qty 1

## 2015-03-08 NOTE — Progress Notes (Signed)
[place] TEAM 1 - Stepdown/ICU TEAM Progress Note  Ellen Jordan DOB: 06/09/1945 DOA: 02/28/2015 PCP: Robert Bellow, MD  Admit HPI / Brief Narrative: 70 y.o. WF PMHx DM Type 2, HTN, [name]   Presented with 3 weeks of worsening dyspnea on exertion w/ a productive cough and fever  HPI/Subjective: 2/26  A/O 4, NAD. Feels much better     Assessment/Plan: Sepsis pneumonia/Bibasilar positiveInfluenza B PNA w/ acute hypoxic respiratory failure  -Completed Tamiflu and Levaquin  -Physiotherapy vest BID -D/C Solu-Medrol  No furher wheezing  -Mucinex DM BID -Continue flutter valve  New onset Afib w/ CHE@@ -Rate still not controlled  - TSH normal  -Strict in and out since admission -1.4 L -Daily a.m. Weight admission= 80.2 kg                2/26 weight standing = 84.8 kg -Echocardiogram; Normal see results below -Continue Metoprolol 50 mg BID - Increase Digoxin 0.25 mg daily -Digoxin level Low @ 0.3 -Pt's rate still not controlled D/C PO Amiodarone; continue Amiodarone gtt +Bolus. -2/26 Metoprolol IV 7.5 mg x1 dose -Consulted Cardiology for Cardioversion  Elevated troponin -Peaked at 0.3 - explained by her RVR   Mod Hypophosphatemia  -Resolved  Hypokalemia  -Potassium goal > 4   Magnesium -Magnesium goal> 2 -Mag-Ox 449m  X 1   HTN -BP currently controlled  DM Type 2 uncontrolled with complications -CBG reasonably controlled  -Increase to home dose Levemir 60 units QHS -Increase NovoLog 24 units QAC -Continue resistant SSI -2/23 Hemoglobin A1c= 7.2 -Although patient's hemoglobin A1c slightly above ADA guidelines studies have shown trying to treat to hemoglobin A1c <7 in older patients has increased mortality   [name] -Lipid panel; w/i ADA guidelines except low HDL -Continue Lipitor 20 mg Daily      Code Status: FULL Family Communication: Husband present at time of exam Disposition Plan: Resolution sepsis    [facility]ural[facility]  [facility] Pending   Procedure/Significant Events: 2/23 Echocardiogram;Left ventricle: mild Concentric hypertrophy. -LVEF= 50% to 55%. - Left atrium: mildly dilated.    Culture 2/18 blood right hand/left AC NEGATIVE FINAL 2/18 MRSA by PCR negative 2/18 positive influenza B 2/18 respiratory virus panel positive influenza B 2/19 HIV negative 2/23 sputum pending   Antibiotics: Aztreonam 2/18 > 2/19 Vanc 2/18 > 2/19 Levaquin 2/18 > 2/23 Tamiflu 2/18 > 2/23   DVT prophylaxis: Full dose Lovenox   Devices    LINES / TUBES:      Continuous Infusions: . amiodarone 30 mg/hr (03/08/15 0312)    Objective: VITAL SIGNS: Temp: 97.9 F (36.6 C) (02/26 0300) Temp Source: Oral (02/26 0300) BP: 119/108 mmHg (02/26 0600) Pulse Rate: 109 (02/26 0600) SPO2; FIO2:   Intake/Output Summary (Last 24 hours) at 03/08/15 0806 Last data filed at 03/08/15 0600  Gross per 24 hour  Intake  440.3 ml  Output   4550 ml  Net -4109.7 ml     Exam: General: A/O 4, NAD, negative acute respiratory distress  Eyes: Negative headache, negative scleral hemorrhage ENT: Negative Runny nose, negative gingival bleeding, Neck:  Negative scars, masses, torticollis, lymphadenopathy, JVD Lungs: mild crackles, negative wheezes  Cardiovascular: Irregular irregular rhythm and rate without murmur gallop or rub normal S1 and S2 Abdomen:negative abdominal pain, nondistended, positive soft, bowel sounds, no rebound, no ascites, no appreciable mass Extremities: No significant cyanosis, clubbing, or edema bilateral lower extremities Psychiatric:  Negative depression, negative anxiety, negative fatigue, negative mania  Neurologic:  Cranial nerves  II through XII intact, tongue/uvula midline, all extremities muscle strength 5/5, sensation intact throughout,  negative dysarthria, negative expressive aphasia, negative receptive aphasia.   Data Reviewed: Basic Metabolic Panel:  Recent  Labs Lab 03/02/15 0345  03/03/15 0215 03/04/15 8099 03/05/15 0445 03/06/15 0400 03/07/15 0259 03/08/15 0446  NA 137  < > 137 136 140 138 141 141  K 3.4*  < > 4.0 4.2 4.2 4.2 4.3 4.2  CL 110  < > 113* 110 108 107 107 105  CO2 18*  < > 18* 19* 21* 21* 23 25  GLUCOSE 149*  < > 187* 256* 291* 300* 280* 171*  BUN 16  < > _0 CREATININE 0.89  < > 0.82 0.81 0.74 0.64 0.83 0.84  CALCIUM 8.3*  < > 8.3* 8.5* 8.8* 8.9 9.0 9.0  MG 1.7  --   --  1.9 1.8 1.7 1.6*  --   PHOS 1.8*  < > 2.1* 3.1 3.0 3.5 4.0  --   < > = values in this interval not displayed. Liver Function Tests:  Recent Labs Lab 03/04/15 0628 03/05/15 0445 03/06/15 0400 03/07/15 0259 03/08/15 0446  AST 44* 75* 78* 97* 49*  ALT 47 75* 88* 116* 89*  ALKPHOS 58 58 49 56 51  BILITOT 0.6 0.4 0.2* 0.4 0.2*  PROT 6.2* 6.3* 5.8* 6.2* 6.2*  ALBUMIN 2.3* 2.3* 2.2* 2.5* 2.6*   No results for input(s): LIPASE, AMYLASE in the last 168 hours. No results for input(s): AMMONIA in the last 168 hours. CBC:  Recent Labs Lab 03/03/15 0215 03/04/15 0628 03/05/15 0445 03/06/15 0400 03/07/15 0259  WBC 8.3 6.3 8.0 6.6 8.5  HGB 9.4* 10.1* 9.7* 10.0* 10.0*  HCT 30.1* 30.6* 30.8* 29.9* 31.4*  MCV 88.0 87.7 86.5 87.4 86.0  PLT 183 171 215 217 270   Cardiac Enzymes:  Recent Labs Lab 03/01/15 0914  TROPONINI 0.08*   BNP (last 3 results)  Recent Labs  02/28/15 1418 03/01/15 0351  BNP 375.0* 408.7*    ProBNP (last 3 results) No results for input(s): PROBNP in the last 8760 hours.  CBG:  Recent Labs Lab 03/07/15 1131 03/07/15 1557 03/07/15 1932 03/07/15 2338 03/08/15 0352  GLUCAP 269* 232* 179* 139* 161*    Recent Results (from the past 240 hour(s))  Blood culture (routine x 2)     Status: None   Collection Time: 02/28/15  2:38 PM  Result Value Ref Range Status   Specimen Description BLOOD RIGHT HAND  Final   Special Requests BOTTLES DRAWN AEROBIC AND ANAEROBIC [facility] EACH  Final   Culture NO  GROWTH 5 DAYS  Final   Report Status 03/05/2015 FINAL  Final  Blood culture (routine x 2)     Status: None   Collection Time: 02/28/15  2:44 PM  Result Value Ref Range Status   Specimen Description BLOOD LEFT ANTECUBITAL  Final   Special Requests BOTTLES DRAWN AEROBIC AND ANAEROBIC [facility] EACH  Final   Culture NO GROWTH 5 DAYS  Final   Report Status 03/05/2015 FINAL  Final  Respiratory virus panel     Status: Abnormal   Collection Time: 02/28/15  6:04 PM  Result Value Ref Range Status   Respiratory Syncytial Virus A Negative Negative Final   Respiratory Syncytial Virus B Negative Negative Final   Influenza A Negative Negative Final   Influenza B Positive (A) Negative Final   Parainfluenza 1 Negative Negative Final   Parainfluenza 2 Negative Negative  Final   Parainfluenza 3 Negative Negative Final   Metapneumovirus Negative Negative Final   Rhinovirus Negative Negative Final   Adenovirus Negative Negative Final    Comment: (NOTE) Performed At: [facility] [place], [place] [id number] Lindon Romp MD UU:[id number]   MRSA PCR Screening     Status: None   Collection Time: 02/28/15  9:23 PM  Result Value Ref Range Status   MRSA by PCR NEGATIVE NEGATIVE Final    Comment:        The GeneXpert MRSA Assay (FDA approved for NASAL specimens only), is one component of a comprehensive MRSA colonization surveillance program. It is not intended to diagnose MRSA infection nor to guide or monitor treatment for MRSA infections.   MRSA PCR Screening     Status: None   Collection Time: 03/02/15 12:29 PM  Result Value Ref Range Status   MRSA by PCR NEGATIVE NEGATIVE Final    Comment:        The GeneXpert MRSA Assay (FDA approved for NASAL specimens only), is one component of a comprehensive MRSA colonization surveillance program. It is not intended to diagnose MRSA infection nor to guide or monitor treatment for MRSA infections.   Culture, sputum-assessment      Status: None   Collection Time: 03/05/15  9:31 AM  Result Value Ref Range Status   Specimen Description SPUTUM  Final   Special Requests Normal  Final   Sputum evaluation   Final    THIS SPECIMEN IS ACCEPTABLE. RESPIRATORY CULTURE REPORT TO FOLLOW.   Report Status 03/05/2015 FINAL  Final  Culture, respiratory (NON-Expectorated)     Status: None (Preliminary result)   Collection Time: 03/05/15  9:31 AM  Result Value Ref Range Status   Specimen Description SPUTUM  Final   Special Requests NONE  Final   Gram Stain   Final    ABUNDANT WBC PRESENT,BOTH PMN AND MONONUCLEAR FEW SQUAMOUS EPITHELIAL CELLS PRESENT FEW GRAM POSITIVE COCCI IN PAIRS Performed at [facility]    Culture   Final    Culture reincubated for better growth Performed at [facility]    Report Status PENDING  Incomplete     Studies:  Recent x-ray studies have been reviewed in detail by the Attending Physician  Scheduled Meds:  Scheduled Meds: . acetylcysteine  2 mL Nebulization TID  . ALPRAZolam  0.5 mg Oral QHS  . aspirin EC  81 mg Oral Daily  . atorvastatin  20 mg Oral q1800  . budesonide (PULMICORT) nebulizer solution  0.5 mg Nebulization BID  . dextromethorphan-guaiFENesin  1 tablet Oral BID  . digoxin  0.25 mg Oral Daily  . enoxaparin (LOVENOX) injection  1 mg/kg Subcutaneous Q12H  . insulin aspart  0-20 Units Subcutaneous 6 times per day  . insulin aspart  0-5 Units Subcutaneous QHS  . insulin aspart  24 Units Subcutaneous TID AC  . insulin detemir  60 Units Subcutaneous QHS  . ipratropium  0.5 mg Nebulization TID  . methylPREDNISolone (SOLU-MEDROL) injection  30 mg Intravenous Q24H  . metoprolol  7.5 mg Intravenous Once  . metoprolol tartrate  50 mg Oral BID  . polyethylene glycol  17 g Oral Daily  . sertraline  100 mg Oral Daily    Time spent on care of this patient: 40 mins   Ahaan Zobrist, Geraldo Docker , MD  [facility] Office  [phone] Pager [phone]  On-Call/Text Page:      [email or web address]  password [email or web address]  If 7PM-7AM, please contact night-coverage [email or web address] Password [email or web address] 03/08/2015, 8:06 AM   LOS: 8 days   Care during the described time interval was provided by me .  I have reviewed this patient's available data, including medical history, events of note, physical examination, and all test results as part of my evaluation. I have personally reviewed and interpreted all radiology studies.   Dia Crawford, MD [phone] Pager

## 2015-03-08 NOTE — Consult Note (Signed)
CARDIOLOGY CONSULT NOTE     Primary Care Physician: Robert Bellow, MD Referring Physician:  Admit Date: 02/28/2015  Reason for consultation:  Ellen Jordan is a 70 y.o. female with a h/o diabetes, hypertension and hyperlipidemia who presented to the hospital on 2/18 with 3 weeks of dyspnea worse with exertion and improved with rest. She also had hyperactive cough and 3-4 days of fevers. She was found to have bibasilar influenza pneumonia with acute hypoxic respiratory failure. She was also noticed to be in atrial fibrillation with rapid rates at the time of presentation.  In speaking with her about the atrial fibrillation, she says that she does not know when she went into atrial fibrillation. She told the hospitalist on her admission that she did feel palpitations, but she tells me today that she has not been symptomatic from her atrial fibrillation. She does say that she has been short of breath and fatigue, but she cannot tell me when the abnormal rhythm started. She is currently on amiodarone as well as Lovenox for anticoagulation.  Today, she denies symptoms of palpitations, chest pain, shortness of breath, orthopnea, PND, lower extremity edema, dizziness, presyncope, syncope, or neurologic sequela. The patient is tolerating medications without difficulties and is otherwise without complaint today.   Past Medical History  Diagnosis Date  . Hypertension   . Diabetes mellitus without complication ([place])   . Hypercholesteremia   . Cancer [facility])    Past Surgical History  Procedure Laterality Date  . Abdominal hysterectomy    . Back surgery    . Cholecystectomy      . acetylcysteine  2 mL Nebulization TID  . ALPRAZolam  0.5 mg Oral QHS  . aspirin EC  81 mg Oral Daily  . atorvastatin  20 mg Oral q1800  . budesonide (PULMICORT) nebulizer solution  0.5 mg Nebulization BID  . dextromethorphan-guaiFENesin  1 tablet Oral BID  . digoxin  0.25 mg Oral Daily  . enoxaparin (LOVENOX)  injection  1 mg/kg Subcutaneous Q12H  . insulin aspart  0-20 Units Subcutaneous 6 times per day  . insulin aspart  0-5 Units Subcutaneous QHS  . insulin aspart  24 Units Subcutaneous TID AC  . insulin detemir  60 Units Subcutaneous QHS  . ipratropium  0.5 mg Nebulization TID  . metoprolol tartrate  50 mg Oral BID  . polyethylene glycol  17 g Oral Daily  . sertraline  100 mg Oral Daily   . amiodarone 30 mg/hr (03/08/15 5300)    Allergies  Allergen Reactions  . Penicillins Hives    Has patient had a PCN reaction causing immediate rash, facial/tongue/throat swelling, SOB or lightheadedness with hypotension: Yes Has patient had a PCN reaction causing severe rash involving mucus membranes or skin necrosis: No Has patient had a PCN reaction that required hospitalization No Has patient had a PCN reaction occurring within the last 10 years: No If all of the above answers are "NO", then may proceed with Cephalosporin use.     Social History   Social History  . Marital Status: Married    Spouse Name: N/A  . Number of Children: N/A  . Years of Education: N/A   Occupational History  . Not on file.   Social History Main Topics  . Smoking status: Former Smoker -- 40 years    Types: Cigarettes    Quit date: 01/10/2005  . Smokeless tobacco: Never Used  . Alcohol Use: No  . Drug Use: No  . Sexual Activity: Not on  file   Other Topics Concern  . Not on file   Social History Narrative    Family History  Problem Relation Age of Onset  . Cancer Mother   . Diabetes Brother     ROS- All systems are reviewed and negative except as per the HPI above  Physical Exam: Telemetry: Filed Vitals:   03/08/15 0600 03/08/15 0744 03/08/15 0800 03/08/15 1100  BP: 119/108  147/79 101/87  Pulse: 109  140 152  Temp:   98.4 F (36.9 C) 98 F (36.7 C)  TempSrc:   Oral Oral  Resp: _0 Height:      Weight:      SpO2: 93% 100% 96% 97%    GEN- The patient is well appearing, alert  and oriented x 3 today.   Head- normocephalic, atraumatic Eyes-  Sclera clear, conjunctiva pink Ears- hearing intact Oropharynx- clear Neck- supple, no JVP Lymph- no cervical lymphadenopathy Lungs- Clear to ausculation bilaterally, normal work of breathing Heart- Regular rate and rhythm, no murmurs, rubs or gallops, PMI not laterally displaced GI- soft, NT, ND, + BS Extremities- no clubbing, cyanosis, or edema MS- no significant deformity or atrophy Skin- no rash or lesion Psych- euthymic mood, full affect Neuro- strength and sensation are intact  EKG:  Labs:   Lab Results  Component Value Date   WBC 8.5 03/07/2015   HGB 10.0* 03/07/2015   HCT 31.4* 03/07/2015   MCV 86.0 03/07/2015   PLT 270 03/07/2015    Recent Labs Lab 03/08/15 0446  NA 141  K 4.2  CL 105  CO2 25  BUN 18  CREATININE 0.84  CALCIUM 9.0  PROT 6.2*  BILITOT 0.2*  ALKPHOS 51  ALT 89*  AST 49*  GLUCOSE 171*   Lab Results  Component Value Date   TROPONINI 0.08* 03/01/2015    Lab Results  Component Value Date   CHOL 89 03/05/2015   Lab Results  Component Value Date   HDL 14* 03/05/2015   Lab Results  Component Value Date   LDLCALC 52 03/05/2015   Lab Results  Component Value Date   TRIG 113 03/05/2015   Lab Results  Component Value Date   CHOLHDL 6.4 03/05/2015   No results found for: LDLDIRECT    Radiology 03/08/15: Slightly improved bilateral lower lung opacities which may represent atelectasis, airspace disease and/ or pleural effusion.  No other significant changes.  Echo 03/05/15: - Left ventricle: The cavity size was normal. There was mild concentric hypertrophy. Systolic function was normal. The estimated ejection fraction was in the range of 50% to 55%. Although no diagnostic regional wall motion abnormality was identified, this possibility cannot be completely excluded on the basis of this study. - Mitral valve: Mildly to moderately calcified annulus.  Moderately calcified leaflets . Valve area by continuity equation (using LVOT flow): 0.8 cm^2. - Left atrium: The atrium was mildly dilated.  ASSESSMENT AND PLAN:   1. Persistent atrial fibrillation:  Currently, the patient is unsure of how long she has been in atrial fibrillation. Due to that, it would be a high risk of stroke for her to be cardioverted without knowing if she has a clot in her left atrium. She has been on Lovenox since she was hospitalized, as well as amiodarone. She is currently not rate controlled. She does complain of shortness of breath and some fatigue, and restoring her to sinus rhythm could easily help resolve some of those symptoms. She would need a TEE  to rule out a left atrial appendage thrombus, and Zelina Jimerson work to organize that as well as cardioversion. This may need to occur on Tuesday. In the meantime, I have increased her beta blocker to see if that Vic Esco help with her rate control. She is on IV amiodarone, and as this may also help with rate control, I'll prefer her not to convert to sinus rhythm without knowing if she has a left atrial appendage thrombus. We'll therefore stop the amiodarone and continue to titrate her beta blockers.  Could switch her from lovenox to Xarelto for anticoagulation as she nears discharge.   Tilly Pernice Meredith Leeds, MD 03/08/2015  1:28 PM

## 2015-03-09 LAB — GLUCOSE, CAPILLARY
GLUCOSE-CAPILLARY: 136 mg/dL — AB (ref 65–99)
GLUCOSE-CAPILLARY: 199 mg/dL — AB (ref 65–99)
GLUCOSE-CAPILLARY: 209 mg/dL — AB (ref 65–99)
GLUCOSE-CAPILLARY: 85 mg/dL (ref 65–99)
Glucose-Capillary: 180 mg/dL — ABNORMAL HIGH (ref 65–99)
Glucose-Capillary: 189 mg/dL — ABNORMAL HIGH (ref 65–99)
Glucose-Capillary: 200 mg/dL — ABNORMAL HIGH (ref 65–99)
Glucose-Capillary: 61 mg/dL — ABNORMAL LOW (ref 65–99)
Glucose-Capillary: 88 mg/dL (ref 65–99)

## 2015-03-09 LAB — CBC
HCT: 32 % — ABNORMAL LOW (ref 36.0–46.0)
Hemoglobin: 9.9 g/dL — ABNORMAL LOW (ref 12.0–15.0)
MCH: 27.2 pg (ref 26.0–34.0)
MCHC: 30.9 g/dL (ref 30.0–36.0)
MCV: 87.9 fL (ref 78.0–100.0)
Platelets: 242 10*3/uL (ref 150–400)
RBC: 3.64 MIL/uL — ABNORMAL LOW (ref 3.87–5.11)
RDW: 14.4 % (ref 11.5–15.5)
WBC: 8.3 10*3/uL (ref 4.0–10.5)

## 2015-03-09 LAB — BASIC METABOLIC PANEL
Anion gap: 9 (ref 5–15)
BUN: 16 mg/dL (ref 6–20)
CALCIUM: 8.6 mg/dL — AB (ref 8.9–10.3)
CO2: 27 mmol/L (ref 22–32)
CREATININE: 0.82 mg/dL (ref 0.44–1.00)
Chloride: 101 mmol/L (ref 101–111)
GFR calc Af Amer: >60 mL/min (ref >60)
Glucose, Bld: 212 mg/dL — ABNORMAL HIGH (ref 65–99)
POTASSIUM: 3.8 mmol/L (ref 3.5–5.1)
SODIUM: 137 mmol/L (ref 135–145)

## 2015-03-09 LAB — MAGNESIUM: MAGNESIUM: 1.5 mg/dL — AB (ref 1.7–2.4)

## 2015-03-09 MED ORDER — INSULIN ASPART 100 UNIT/ML ~~LOC~~ SOLN
0.0000 [IU] | Freq: Three times a day (TID) | SUBCUTANEOUS | Status: DC
  Administered 2015-03-10: 3 [IU] via SUBCUTANEOUS
  Administered 2015-03-10: 4 [IU] via SUBCUTANEOUS
  Administered 2015-03-11: 7 [IU] via SUBCUTANEOUS
  Administered 2015-03-12 (×2): 4 [IU] via SUBCUTANEOUS

## 2015-03-09 MED ORDER — POTASSIUM CHLORIDE CRYS ER 20 MEQ PO TBCR
40.0000 meq | EXTENDED_RELEASE_TABLET | Freq: Once | ORAL | Status: AC
  Administered 2015-03-09: 40 meq via ORAL
  Filled 2015-03-09: qty 2

## 2015-03-09 MED ORDER — INSULIN ASPART 100 UNIT/ML ~~LOC~~ SOLN: 15.0000 [IU] | Freq: Three times a day (TID) | SUBCUTANEOUS | Status: DC

## 2015-03-09 MED ORDER — INSULIN DETEMIR 100 UNIT/ML ~~LOC~~ SOLN
50.0000 [IU] | Freq: Every day | SUBCUTANEOUS | Status: DC
  Administered 2015-03-09: 50 [IU] via SUBCUTANEOUS
  Filled 2015-03-09 (×2): qty 0.5

## 2015-03-09 MED ORDER — IBUPROFEN 200 MG PO TABS: 400.0000 mg | ORAL_TABLET | Freq: Four times a day (QID) | ORAL | Status: DC | PRN

## 2015-03-09 MED ORDER — MAGNESIUM SULFATE 2 GM/50ML IV SOLN
2.0000 g | Freq: Once | INTRAVENOUS | Status: AC
  Administered 2015-03-09: 2 g via INTRAVENOUS
  Filled 2015-03-09: qty 50

## 2015-03-09 NOTE — Progress Notes (Signed)
Pt transferred to 3W27 via wheelchair. Pt's husband accompanied. CCMD and [facility] notified of transfer. Report given to [place]

## 2015-03-09 NOTE — Progress Notes (Signed)
Inpatient Diabetes Program Recommendations  AACE/ADA: New Consensus Statement on Inpatient Glycemic Control (2015)  Target Ranges:  Prepandial:   less than 140 mg/dL      Peak postprandial:   less than 180 mg/dL (1-2 hours)      Critically ill patients:  140 - 180 mg/dL   Review of Glycemic Control  Diabetes history: DM 2 Outpatient Diabetes medications: Levemir 60 at HS, Invokana, Victoza. Current orders for Inpatient glycemic control: Levemir 60, and resistant q 4 hrs and HS correction. Once NPO, can use resistant q 4 hrs, but at this time tidwc would be all she needs. Solumedrol 30 has been discontinued, last dose on 2/26.  Inpatient Diabetes Program Recommendations:    (Will write note to pharmacy regarding changes needed in correction scale while eating and while NPO. May want to consider a decrease in levemir to 45 - 50  Units at HS as fasting glucose this am was 61 mg/dL. (Now that solumedrol has been stopped, basal needs may have cut back)  Will follow. Thank you Rosita Kea, RN, MSN, CDE  Diabetes Inpatient Program Office: [phone] Pager: [phone] 8:00 am to 5:00 pm

## 2015-03-09 NOTE — Progress Notes (Signed)
    SUBJECTIVE:  No chest pain.  No SOB.    PHYSICAL EXAM Filed Vitals:   03/09/15 0400 03/09/15 0631 03/09/15 0730 03/09/15 0852  BP: 113/89 116/68    Pulse: 152 138    Temp:    98.8 F (37.1 C)  TempSrc:    Oral  Resp: 21     Height:      Weight:      SpO2: 98%  98%    General:  No distress Lungs:  Clear Heart:  Irrebular Abdomen:  Positive bowel sounds, no rebound no guarding Extremities:  No edema   LABS:  Results for orders placed or performed during the hospital encounter of 02/28/15 (from the past 24 hour(s))  Glucose, capillary     Status: Abnormal   Collection Time: 03/08/15 12:27 PM  Result Value Ref Range   Glucose-Capillary 137 (H) 65 - 99 mg/dL  Glucose, capillary     Status: None   Collection Time: 03/08/15  4:39 PM  Result Value Ref Range   Glucose-Capillary 91 65 - 99 mg/dL  Glucose, capillary     Status: Abnormal   Collection Time: 03/08/15  7:34 PM  Result Value Ref Range   Glucose-Capillary 180 (H) 65 - 99 mg/dL  Glucose, capillary     Status: Abnormal   Collection Time: 03/08/15 11:59 PM  Result Value Ref Range   Glucose-Capillary 200 (H) 65 - 99 mg/dL  Glucose, capillary     Status: Abnormal   Collection Time: 03/09/15 12:21 AM  Result Value Ref Range   Glucose-Capillary 209 (H) 65 - 99 mg/dL  Basic metabolic panel     Status: Abnormal   Collection Time: 03/09/15  3:00 AM  Result Value Ref Range   Sodium 137 135 - 145 mmol/L   Potassium 3.8 3.5 - 5.1 mmol/L   Chloride 101 101 - 111 mmol/L   CO2 27 22 - 32 mmol/L   Glucose, Bld 212 (H) 65 - 99 mg/dL   BUN 16 6 - 20 mg/dL   Creatinine, Ser 0.82 0.44 - 1.00 mg/dL   Calcium 8.6 (L) 8.9 - 10.3 mg/dL   GFR calc non Af Amer >60 >60 mL/min   GFR calc Af Amer >60 >60 mL/min   Anion gap 9 5 - 15  Magnesium     Status: Abnormal   Collection Time: 03/09/15  3:00 AM  Result Value Ref Range   Magnesium 1.5 (L) 1.7 - 2.4 mg/dL  Glucose, capillary     Status: Abnormal   Collection Time: 03/09/15   3:34 AM  Result Value Ref Range   Glucose-Capillary 189 (H) 65 - 99 mg/dL    Intake/Output Summary (Last 24 hours) at 03/09/15 7829 Last data filed at 03/09/15 0400  Gross per 24 hour  Intake 2741.2 ml  Output    700 ml  Net 2041.2 ml    ASSESSMENT AND PLAN:  Atrial fib:   Plan TEE/DCCV for tomorrow.    NPO after MN.  Metoprolol was increased yesterday.  Amio stopped so as not to chemically convert.  Continue current therapy.  Would be OK for transfer to the floor.    Jeneen Rinks [facility] 03/09/2015 9:04 AM

## 2015-03-09 NOTE — Progress Notes (Signed)
[facility] for Lovenox  Indication: atrial fibrillation  Patient Measurements: Height: _0  (160 cm) Weight: 187 lb 2.7 oz (84.9 kg) IBW/kg (Calculated) : 52.4  Vital Signs: Temp: 97.6 F (36.4 C) (02/27 0350) Temp Source: Oral (02/27 0350) BP: 116/68 mmHg (02/27 0631) Pulse Rate: 138 (02/27 0631)  Labs:  Recent Labs  03/07/15 0259 03/08/15 0446 03/09/15 0300  HGB 10.0*  --   --   HCT 31.4*  --   --   PLT 270  --   --   CREATININE 0.83 0.84 0.82   Estimated Creatinine Clearance: 65 mL/min (by C-G formula based on Cr of 0.82).  Medical History: Past Medical History  Diagnosis Date  . Hypertension   . Diabetes mellitus without complication ([place])   . Hypercholesteremia   . Cancer [facility])    Assessment: 70 y/o F with 3 weeks of worsening dyspnea, found to be in afib/RVR, also with PNA per CXR also flu +.  CBC continues to be stable this am, renal function within normal limits, no anti-coagulation PTA. Planning for possible TEE/DCCV on Tuesday.   Goal of Therapy:  Monitor platelets by anticoagulation protocol: Yes   Plan:  -Continue Lovenox80 mg subcutaneous q12h -Minimum q72h CBC while inpatient -Monitor for bleeding -May switch to xarelto closer to discharge per MD   Latosha Gaylord C. Lennox Grumbles, PharmD Pharmacy Resident  Pager: [phone] 03/09/2015 7:33 AM

## 2015-03-09 NOTE — Clinical Social Work Note (Signed)
CSW updated patient and her family about bed offers, patient is thinking she will be able to go home with home health.  If they are not able to go home with home health, they would like [facility] in [place], [place] to continue to follow patient's progress.  Jones Broom. [place], MSW, [place] 03/09/2015 2:55 PM

## 2015-03-09 NOTE — Progress Notes (Signed)
[place] TEAM 1 - Stepdown/ICU TEAM PROGRESS NOTE  JERIAH SKUFCA PZZ:[id number] DOB: April 08, 1945 DOA: 02/28/2015 PCP: Robert Bellow, MD  Admit HPI / Brief Narrative: 70 y.o. female [name], HTN, [name] who presented with 3 weeks of worsening dyspnea on exertion w/ a productive cough and  fever.   HPI/Subjective: The patient looks dramatically better than when I saw her one week ago.  She denies chest pain shortness of breath fevers chills nausea or vomiting.  Assessment/Plan:  Bibasilar Influenza B PNA w/ acute hypoxic respiratory failure  Has completed a course of both Tamiflu and Levaquin - stable from a respiratory standpoint at this time   New onset Afib w/ RVR Cardiology following  - patient is anticoagulated  - plan for cardioversion tomorrow   Elevated troponin Peaked at 0.3 - explained by her RVR -  has been seen by cardiology   Mod Hypophosphatemia  Resolved  Hypokalemia Improved - goal is 4.0 - follow  Hypomagnesemia Goal is 2.0 - replace and follow   HTN BP controlled  DM2 Some hypoglycemia - adjust tx and follow   [name]  Code Status: FULL Family Communication: spoke w/ daughter at bedside  Disposition Plan: stable for transfer to tele bed - for DCCV in AM - eventual SNF v/s d/c home w/ HH depending upon progression   Consultants: PCCM [facility] Cards  Procedures: 2/23 TTE - Left ventricle: mild Concentric hypertrophy - EF 50% to 55% - Left atrium: mildly dilated.   Antibiotics: Aztreonam 2/18 > 2/19 Vanc 2/18 > 2/19 Levaquin 2/18 > 2/23 Tamiflu 2/18 > 2/23  DVT prophylaxis: Full dose lovenox   Objective: Blood pressure 119/56, pulse 104, temperature 97.1 F (36.2 C), temperature source Axillary, resp. rate 22, height 5' 3" (1.6 m), weight 84.9 kg (187 lb 2.7 oz), SpO2 99 %.  Intake/Output Summary (Last 24 hours) at 03/09/15 1531 Last data filed at 03/09/15 1057  Gross per 24 hour  Intake   2401 ml  Output   1100 ml  Net   1301 ml    Exam: General: no acute resp distress - alert and oriented  Lungs: CTA th/o - no wheeze  Cardiovascular: Irregularly irregular Abdomen: Nontender, nondistended, soft, bowel sounds positive, no rebound, no ascites, no appreciable mass Extremities: No significant cyanosis, clubbing, edema bilateral lower extremities  Data Reviewed:  Basic Metabolic Panel:  Recent Labs Lab 03/03/15 0215  03/04/15 0628 03/05/15 0445 03/06/15 0400 03/07/15 0259 03/08/15 0446 03/08/15 0844 03/09/15 0300  NA 137  --  136 140 138 141 141  --  137  K 4.0  --  4.2 4.2 4.2 4.3 4.2  --  3.8  CL 113*  --  110 108 107 107 105  --  101  CO2 18*  --  19* 21* 21* 23 25  --  27  GLUCOSE 187*  --  256* 291* 300* 280* 171*  --  212*  BUN 16  --  _0 --  16  CREATININE 0.82  --  0.81 0.74 0.64 0.83 0.84  --  0.82  CALCIUM 8.3*  --  8.5* 8.8* 8.9 9.0 9.0  --  8.6*  MG  --   < > 1.9 1.8 1.7 1.6*  --  1.7 1.5*  PHOS 2.1*  --  3.1 3.0 3.5 4.0  --   --   --   < > = values in this interval not displayed.  CBC:  Recent Labs  Lab 03/03/15 0215 03/04/15 0628 03/05/15 0445 03/06/15 0400 03/07/15 0259  WBC 8.3 6.3 8.0 6.6 8.5  HGB 9.4* 10.1* 9.7* 10.0* 10.0*  HCT 30.1* 30.6* 30.8* 29.9* 31.4*  MCV 88.0 87.7 86.5 87.4 86.0  PLT 183 171 215 217 270    Liver Function Tests:  Recent Labs Lab 03/04/15 0628 03/05/15 0445 03/06/15 0400 03/07/15 0259 03/08/15 0446  AST 44* 75* 78* 97* 49*  ALT 47 75* 88* 116* 89*  ALKPHOS 58 58 49 56 51  BILITOT 0.6 0.4 0.2* 0.4 0.2*  PROT 6.2* 6.3* 5.8* 6.2* 6.2*  ALBUMIN 2.3* 2.3* 2.2* 2.5* 2.6*   CBG:  Recent Labs Lab 03/09/15 0021 03/09/15 0334 03/09/15 0844 03/09/15 0958 03/09/15 1231  GLUCAP 209* 189* 61* 136* 199*    Recent Results (from the past 240 hour(s))  Blood culture (routine x 2)     Status: None   Collection Time: 02/28/15  2:38 PM  Result Value Ref Range Status   Specimen Description BLOOD RIGHT HAND  Final   Special Requests  BOTTLES DRAWN AEROBIC AND ANAEROBIC [place]  Final   Culture NO GROWTH 5 DAYS  Final   Report Status 03/05/2015 FINAL  Final  Blood culture (routine x 2)     Status: None   Collection Time: 02/28/15  2:44 PM  Result Value Ref Range Status   Specimen Description BLOOD LEFT ANTECUBITAL  Final   Special Requests BOTTLES DRAWN AEROBIC AND ANAEROBIC [place]  Final   Culture NO GROWTH 5 DAYS  Final   Report Status 03/05/2015 FINAL  Final  Respiratory virus panel     Status: Abnormal   Collection Time: 02/28/15  6:04 PM  Result Value Ref Range Status   Respiratory Syncytial Virus A Negative Negative Final   Respiratory Syncytial Virus B Negative Negative Final   Influenza A Negative Negative Final   Influenza B Positive (A) Negative Final   Parainfluenza 1 Negative Negative Final   Parainfluenza 2 Negative Negative Final   Parainfluenza 3 Negative Negative Final   Metapneumovirus Negative Negative Final   Rhinovirus Negative Negative Final   Adenovirus Negative Negative Final    Comment: (NOTE) Performed At: [facility] [place], [place] [id number] Lindon Romp MD OI:[id number]   MRSA PCR Screening     Status: None   Collection Time: 02/28/15  9:23 PM  Result Value Ref Range Status   MRSA by PCR NEGATIVE NEGATIVE Final    Comment:        The GeneXpert MRSA Assay (FDA approved for NASAL specimens only), is one component of a comprehensive MRSA colonization surveillance program. It is not intended to diagnose MRSA infection nor to guide or monitor treatment for MRSA infections.   MRSA PCR Screening     Status: None   Collection Time: 03/02/15 12:29 PM  Result Value Ref Range Status   MRSA by PCR NEGATIVE NEGATIVE Final    Comment:        The GeneXpert MRSA Assay (FDA approved for NASAL specimens only), is one component of a comprehensive MRSA colonization surveillance program. It is not intended to diagnose MRSA infection nor to guide  or monitor treatment for MRSA infections.   Culture, sputum-assessment     Status: None   Collection Time: 03/05/15  9:31 AM  Result Value Ref Range Status   Specimen Description SPUTUM  Final   Special Requests Normal  Final   Sputum evaluation   Final  THIS SPECIMEN IS ACCEPTABLE. RESPIRATORY CULTURE REPORT TO FOLLOW.   Report Status 03/05/2015 FINAL  Final  Culture, respiratory (NON-Expectorated)     Status: None   Collection Time: 03/05/15  9:31 AM  Result Value Ref Range Status   Specimen Description SPUTUM  Final   Special Requests NONE  Final   Gram Stain   Final    ABUNDANT WBC PRESENT,BOTH PMN AND MONONUCLEAR FEW SQUAMOUS EPITHELIAL CELLS PRESENT FEW GRAM POSITIVE COCCI IN PAIRS Performed at [facility]    Culture   Final    NORMAL OROPHARYNGEAL FLORA Performed at [facility]    Report Status 03/08/2015 FINAL  Final     Studies:   Recent x-ray studies have been reviewed in detail by the Attending Physician  Scheduled Meds:  Scheduled Meds: . ALPRAZolam  0.5 mg Oral QHS  . aspirin EC  81 mg Oral Daily  . atorvastatin  20 mg Oral q1800  . budesonide (PULMICORT) nebulizer solution  0.5 mg Nebulization BID  . dextromethorphan-guaiFENesin  1 tablet Oral BID  . digoxin  0.25 mg Oral Daily  . enoxaparin (LOVENOX) injection  1 mg/kg Subcutaneous Q12H  . insulin aspart  0-20 Units Subcutaneous TID WC  . insulin aspart  0-5 Units Subcutaneous QHS  . insulin aspart  24 Units Subcutaneous TID AC  . insulin detemir  60 Units Subcutaneous QHS  . metoprolol tartrate  25 mg Oral 4 times per day  . polyethylene glycol  17 g Oral Daily  . sertraline  100 mg Oral Daily    Time spent on care of this patient: 35 mins   MCCLUNG,JEFFREY T , MD   [facility] Office  [phone] Pager - Text Page per Shea Evans as per below:  On-Call/Text Page:      [email or web address]      password [email or web address]  If 7PM-7AM, please contact  night-coverage [email or web address] Password [email or web address] 03/09/2015, 3:31 PM   LOS: 9 days

## 2015-03-09 NOTE — Progress Notes (Signed)
Physical Therapy Treatment Patient Details Name: Ellen Jordan MRN: [id number] DOB: 04/15/1945 Today's Date: 03/09/2015    History of Present Illness 70 y.o. WF PMHx DM Type 2, HTN, [name] who present with worsening DOE, productive cough and fever. Found to have Spesis, pnuemonia/bibasilar positive influenza B and PNA with acute hypoxic respiratory failure.    PT Comments    Noting improvements in functional mobility and activity tolerance, despite continued variability in HR and overall tachycardia; Noted for TEE and cardioversion tomorrow; If she progresses well, we may be able to get her home with assist from family and [place] therapies; Will monitor for progress and update dc as appropriate;   Follow Up Recommendations  SNF;Other (comment) (will monitor for progress for possibility of dc home with [facility])     Equipment Recommendations  Rolling walker with 5" wheels    Recommendations for Other Services       Precautions / Restrictions Precautions Precautions: Fall Precaution Comments: Monitor HR Restrictions Weight Bearing Restrictions: No    Mobility  Bed Mobility                  Transfers Overall transfer level: Needs assistance Equipment used: Rolling walker (2 wheeled) Transfers: Sit to/from Stand Sit to Stand: Min assist         General transfer comment: Cues for hand placement; min assist to power up  Ambulation/Gait Ambulation/Gait assistance: Min guard Ambulation Distance (Feet): 60 Feet Assistive device: Rolling walker (2 wheeled) Gait Pattern/deviations: Step-through pattern;Decreased stride length Gait velocity: decreased   General Gait Details: Very slow with cues to self-monitor for activity tolerance; noting steadier with RW than last session   Stairs            Wheelchair Mobility    Modified Rankin (Stroke Patients Only)       Balance             Standing balance-Leahy Scale: Fair Standing balance comment: no overt  posterior bias                     Cognition Arousal/Alertness: Awake/alert Behavior During Therapy: WFL for tasks assessed/performed Overall Cognitive Status: Within Functional Limits for tasks assessed                      Exercises      General Comments General comments (skin integrity, edema, etc.): Cues to self-monitor for activity tolerance; HR variable during walk, and ranged 113-155; noted for TEE and cardioversion likely tomorrow      Pertinent Vitals/Pain Pain Assessment: No/denies pain    Home Living                      Prior Function            PT Goals (current goals can now be found in the care plan section) Acute Rehab PT Goals Patient Stated Goal: to go home as soon as she can, and see her dog Lucy PT Goal Formulation: With patient/family Time For Goal Achievement: 03/19/15 Potential to Achieve Goals: Good Progress towards PT goals: Progressing toward goals    Frequency  Min 3X/week    PT Plan Frequency needs to be updated    Co-evaluation             End of Session   Activity Tolerance: Patient tolerated treatment well Patient left: in chair;with call bell/phone within reach;with family/visitor present     Time: 3875-6433 PT  Time Calculation (min) (ACUTE ONLY): 17 min  Charges:  $Gait Training: 8-22 mins                    G Codes:      Quin Hoop 03/09/2015, 11:41 AM  Roney Marion, PT  Acute Rehabilitation Services Pager [phone] Office [phone]

## 2015-03-10 ENCOUNTER — Encounter (HOSPITAL_COMMUNITY)
Admission: EM | Disposition: A | Discharge status: Home / Self Care | Payer: Self-pay | Source: Home / Self Care | Attending: Internal Medicine

## 2015-03-10 ENCOUNTER — Inpatient Hospital Stay (HOSPITAL_COMMUNITY): Payer: Commercial Managed Care - HMO | Admitting: Certified Registered"

## 2015-03-10 ENCOUNTER — Encounter (HOSPITAL_COMMUNITY): Payer: Self-pay | Admitting: Certified Registered"

## 2015-03-10 ENCOUNTER — Inpatient Hospital Stay (HOSPITAL_COMMUNITY): Payer: Commercial Managed Care - HMO

## 2015-03-10 LAB — GLUCOSE, CAPILLARY
GLUCOSE-CAPILLARY: 163 mg/dL — AB (ref 65–99)
Glucose-Capillary: 67 mg/dL (ref 65–99)
Glucose-Capillary: 104 mg/dL — ABNORMAL HIGH (ref 65–99)
Glucose-Capillary: 130 mg/dL — ABNORMAL HIGH (ref 65–99)
Glucose-Capillary: 171 mg/dL — ABNORMAL HIGH (ref 65–99)

## 2015-03-10 LAB — BASIC METABOLIC PANEL
Anion gap: 11 (ref 5–15)
BUN: 14 mg/dL (ref 6–20)
CALCIUM: 9.1 mg/dL (ref 8.9–10.3)
CO2: 29 mmol/L (ref 22–32)
CREATININE: 0.88 mg/dL (ref 0.44–1.00)
Chloride: 102 mmol/L (ref 101–111)
GFR calc Af Amer: >60 mL/min (ref >60)
GLUCOSE: 84 mg/dL (ref 65–99)
Potassium: 4.4 mmol/L (ref 3.5–5.1)
Sodium: 142 mmol/L (ref 135–145)

## 2015-03-10 LAB — MAGNESIUM: Magnesium: 1.7 mg/dL (ref 1.7–2.4)

## 2015-03-10 SURGERY — CANCELLED PROCEDURE
Anesthesia: Monitor Anesthesia Care

## 2015-03-10 MED ORDER — INSULIN ASPART 100 UNIT/ML ~~LOC~~ SOLN
12.0000 [IU] | Freq: Three times a day (TID) | SUBCUTANEOUS | Status: DC
  Administered 2015-03-10 (×2): 12 [IU] via SUBCUTANEOUS

## 2015-03-10 MED ORDER — FUROSEMIDE 10 MG/ML IJ SOLN
40.0000 mg | Freq: Once | INTRAMUSCULAR | Status: AC
  Administered 2015-03-10: 40 mg via INTRAVENOUS
  Filled 2015-03-10: qty 4

## 2015-03-10 MED ORDER — RIVAROXABAN 20 MG PO TABS
20.0000 mg | ORAL_TABLET | Freq: Every day | ORAL | Status: DC
  Administered 2015-03-10 – 2015-03-11 (×2): 20 mg via ORAL
  Filled 2015-03-10 (×2): qty 1

## 2015-03-10 MED ORDER — DIPHENHYDRAMINE HCL 25 MG PO CAPS
50.0000 mg | ORAL_CAPSULE | Freq: Once | ORAL | Status: AC
  Administered 2015-03-10: 50 mg via ORAL
  Filled 2015-03-10: qty 2

## 2015-03-10 MED ORDER — FUROSEMIDE 40 MG PO TABS
40.0000 mg | ORAL_TABLET | Freq: Every day | ORAL | Status: DC
  Administered 2015-03-10 – 2015-03-12 (×3): 40 mg via ORAL
  Filled 2015-03-10 (×3): qty 1

## 2015-03-10 MED ORDER — MAGNESIUM SULFATE 2 GM/50ML IV SOLN
2.0000 g | Freq: Once | INTRAVENOUS | Status: AC
  Administered 2015-03-10: 2 g via INTRAVENOUS
  Filled 2015-03-10: qty 50

## 2015-03-10 MED ORDER — METOPROLOL TARTRATE 50 MG PO TABS
50.0000 mg | ORAL_TABLET | Freq: Once | ORAL | Status: AC
  Administered 2015-03-10: 50 mg via ORAL
  Filled 2015-03-10: qty 1

## 2015-03-10 MED ORDER — INSULIN DETEMIR 100 UNIT/ML ~~LOC~~ SOLN
40.0000 [IU] | Freq: Every day | SUBCUTANEOUS | Status: DC
  Administered 2015-03-10 – 2015-03-11 (×2): 40 [IU] via SUBCUTANEOUS
  Filled 2015-03-10 (×3): qty 0.4

## 2015-03-10 NOTE — Anesthesia Preprocedure Evaluation (Deleted)
Anesthesia Evaluation  Patient identified by MRN, date of birth, ID band Patient awake  General Assessment Comment:70 y.o. female [name], HTN, [name] who presented with 3 weeks of worsening dyspnea on exertion w/ a productive cough and fever.   Reviewed: Allergy & Precautions, NPO status , Patient's Chart, lab work & pertinent test results, reviewed documented beta blocker date and time   History of Anesthesia Complications Negative for: history of anesthetic complications  Airway        Dental   Pulmonary neg pulmonary ROS, former smoker,  Bibasilar Influenza B PNA w/ acute hypoxic respiratory failure--Has completed a course of both Tamiflu and Levaquin - stable from a respiratory standpoint at this time           Cardiovascular hypertension, Pt. on home beta blockers and Pt. on medications + dysrhythmias Atrial Fibrillation      Neuro/Psych PSYCHIATRIC DISORDERS Anxiety negative neurological ROS     GI/Hepatic negative GI ROS, Neg liver ROS,   Endo/Other  diabetes, Type 2, Insulin DependentObesity   Renal/GU negative Renal ROS     Musculoskeletal negative musculoskeletal ROS (+)   Abdominal   Peds  Hematology  (+) Blood dyscrasia, anemia ,   Anesthesia Other Findings Day of surgery medications reviewed with the patient.  Reproductive/Obstetrics                         Anesthesia Physical Anesthesia Plan  ASA: III  Anesthesia Plan: MAC   Post-op Pain Management:    Induction: Intravenous  Airway Management Planned: Nasal Cannula  Additional Equipment:   Intra-op Plan:   Post-operative Plan:   Informed Consent: I have reviewed the patients History and Physical, chart, labs and discussed the procedure including the risks, benefits and alternatives for the proposed anesthesia with the patient or authorized representative who has indicated his/her understanding and acceptance.    Dental advisory given  Plan Discussed with: CRNA and Anesthesiologist  Anesthesia Plan Comments: (Discussed risks/benefits/alternatives to MAC sedation including need for ventilatory support, hypotension, need for conversion to general anesthesia.  All patient questions answered.  Patient wished to proceed.)        Anesthesia Quick Evaluation

## 2015-03-10 NOTE — H&P (View-Only) (Signed)
CARDIOLOGY CONSULT NOTE     Primary Care Physician: Robert Bellow, MD Referring Physician:  Admit Date: 02/28/2015  Reason for consultation:  Ellen Jordan is a 70 y.o. female with a h/o diabetes, hypertension and hyperlipidemia who presented to the hospital on 2/18 with 3 weeks of dyspnea worse with exertion and improved with rest. She also had hyperactive cough and 3-4 days of fevers. She was found to have bibasilar influenza pneumonia with acute hypoxic respiratory failure. She was also noticed to be in atrial fibrillation with rapid rates at the time of presentation.  In speaking with her about the atrial fibrillation, she says that she does not know when she went into atrial fibrillation. She told the hospitalist on her admission that she did feel palpitations, but she tells me today that she has not been symptomatic from her atrial fibrillation. She does say that she has been short of breath and fatigue, but she cannot tell me when the abnormal rhythm started. She is currently on amiodarone as well as Lovenox for anticoagulation.  Today, she denies symptoms of palpitations, chest pain, shortness of breath, orthopnea, PND, lower extremity edema, dizziness, presyncope, syncope, or neurologic sequela. The patient is tolerating medications without difficulties and is otherwise without complaint today.   Past Medical History  Diagnosis Date  . Hypertension   . Diabetes mellitus without complication ([place])   . Hypercholesteremia   . Cancer [facility])    Past Surgical History  Procedure Laterality Date  . Abdominal hysterectomy    . Back surgery    . Cholecystectomy      . acetylcysteine  2 mL Nebulization TID  . ALPRAZolam  0.5 mg Oral QHS  . aspirin EC  81 mg Oral Daily  . atorvastatin  20 mg Oral q1800  . budesonide (PULMICORT) nebulizer solution  0.5 mg Nebulization BID  . dextromethorphan-guaiFENesin  1 tablet Oral BID  . digoxin  0.25 mg Oral Daily  . enoxaparin (LOVENOX)  injection  1 mg/kg Subcutaneous Q12H  . insulin aspart  0-20 Units Subcutaneous 6 times per day  . insulin aspart  0-5 Units Subcutaneous QHS  . insulin aspart  24 Units Subcutaneous TID AC  . insulin detemir  60 Units Subcutaneous QHS  . ipratropium  0.5 mg Nebulization TID  . metoprolol tartrate  50 mg Oral BID  . polyethylene glycol  17 g Oral Daily  . sertraline  100 mg Oral Daily   . amiodarone 30 mg/hr (03/08/15 2993)    Allergies  Allergen Reactions  . Penicillins Hives    Has patient had a PCN reaction causing immediate rash, facial/tongue/throat swelling, SOB or lightheadedness with hypotension: Yes Has patient had a PCN reaction causing severe rash involving mucus membranes or skin necrosis: No Has patient had a PCN reaction that required hospitalization No Has patient had a PCN reaction occurring within the last 10 years: No If all of the above answers are "NO", then may proceed with Cephalosporin use.     Social History   Social History  . Marital Status: Married    Spouse Name: N/A  . Number of Children: N/A  . Years of Education: N/A   Occupational History  . Not on file.   Social History Main Topics  . Smoking status: Former Smoker -- 40 years    Types: Cigarettes    Quit date: 01/10/2005  . Smokeless tobacco: Never Used  . Alcohol Use: No  . Drug Use: No  . Sexual Activity: Not on  file   Other Topics Concern  . Not on file   Social History Narrative    Family History  Problem Relation Age of Onset  . Cancer Mother   . Diabetes Brother     ROS- All systems are reviewed and negative except as per the HPI above  Physical Exam: Telemetry: Filed Vitals:   03/08/15 0600 03/08/15 0744 03/08/15 0800 03/08/15 1100  BP: 119/108  147/79 101/87  Pulse: 109  140 152  Temp:   98.4 F (36.9 C) 98 F (36.7 C)  TempSrc:   Oral Oral  Resp: 18  20 22  Height:      Weight:      SpO2: 93% 100% 96% 97%    GEN- The patient is well appearing, alert  and oriented x 3 today.   Head- normocephalic, atraumatic Eyes-  Sclera clear, conjunctiva pink Ears- hearing intact Oropharynx- clear Neck- supple, no JVP Lymph- no cervical lymphadenopathy Lungs- Clear to ausculation bilaterally, normal work of breathing Heart- Regular rate and rhythm, no murmurs, rubs or gallops, PMI not laterally displaced GI- soft, NT, ND, + BS Extremities- no clubbing, cyanosis, or edema MS- no significant deformity or atrophy Skin- no rash or lesion Psych- euthymic mood, full affect Neuro- strength and sensation are intact  EKG:  Labs:   Lab Results  Component Value Date   WBC 8.5 03/07/2015   HGB 10.0* 03/07/2015   HCT 31.4* 03/07/2015   MCV 86.0 03/07/2015   PLT 270 03/07/2015    Recent Labs Lab 03/08/15 0446  NA 141  K 4.2  CL 105  CO2 25  BUN 18  CREATININE 0.84  CALCIUM 9.0  PROT 6.2*  BILITOT 0.2*  ALKPHOS 51  ALT 89*  AST 49*  GLUCOSE 171*   Lab Results  Component Value Date   TROPONINI 0.08* 03/01/2015    Lab Results  Component Value Date   CHOL 89 03/05/2015   Lab Results  Component Value Date   HDL 14* 03/05/2015   Lab Results  Component Value Date   LDLCALC 52 03/05/2015   Lab Results  Component Value Date   TRIG 113 03/05/2015   Lab Results  Component Value Date   CHOLHDL 6.4 03/05/2015   No results found for: LDLDIRECT    Radiology 03/08/15: Slightly improved bilateral lower lung opacities which may represent atelectasis, airspace disease and/ or pleural effusion.  No other significant changes.  Echo 03/05/15: - Left ventricle: The cavity size was normal. There was mild concentric hypertrophy. Systolic function was normal. The estimated ejection fraction was in the range of 50% to 55%. Although no diagnostic regional wall motion abnormality was identified, this possibility cannot be completely excluded on the basis of this study. - Mitral valve: Mildly to moderately calcified annulus.  Moderately calcified leaflets . Valve area by continuity equation (using LVOT flow): 0.8 cm^2. - Left atrium: The atrium was mildly dilated.  ASSESSMENT AND PLAN:   1. Persistent atrial fibrillation:  Currently, the patient is unsure of how long she has been in atrial fibrillation. Due to that, it would be a high risk of stroke for her to be cardioverted without knowing if she has a clot in her left atrium. She has been on Lovenox since she was hospitalized, as well as amiodarone. She is currently not rate controlled. She does complain of shortness of breath and some fatigue, and restoring her to sinus rhythm could easily help resolve some of those symptoms. She would need a TEE   to rule out a left atrial appendage thrombus, and Ellen Jordan work to organize that as well as cardioversion. This may need to occur on Tuesday. In the meantime, I have increased her beta blocker to see if that Ellen Jordan help with her rate control. She is on IV amiodarone, and as this may also help with rate control, I'll prefer her not to convert to sinus rhythm without knowing if she has a left atrial appendage thrombus. We'll therefore stop the amiodarone and continue to titrate her beta blockers.  Could switch her from lovenox to Xarelto for anticoagulation as she nears discharge.   Ellen Wenke Meredith Leeds, MD 03/08/2015  1:28 PM

## 2015-03-10 NOTE — Progress Notes (Signed)
SUBJECTIVE:  She unfortunately was given orange juice this AM and had to have DCCV postponed possibly until Thursday.  She is not having any chest pain.  No SOB.  Mild cough still.     PHYSICAL EXAM Filed Vitals:   03/10/15 0500 03/10/15 1006 03/10/15 1100 03/10/15 1201  BP: 123/60 124/80 116/47   Pulse: 54  69   Temp: 98 F (36.7 C) 98.4 F (36.9 C)    TempSrc:  Oral    Resp: 18 19    Height:      Weight: 184 lb 14.4 oz (83.87 kg)     SpO2: 96% 97%  97%   General:  No distress Lungs:  Clear Heart:  Irregular Abdomen:  Positive bowel sounds, no rebound no guarding Extremities:  Mod edema  LABS:   Results for orders placed or performed during the hospital encounter of 02/28/15 (from the past 24 hour(s))  Glucose, capillary     Status: None   Collection Time: 03/09/15  4:57 PM  Result Value Ref Range   Glucose-Capillary 85 65 - 99 mg/dL   Comment 1 Capillary Specimen   Glucose, capillary     Status: None   Collection Time: 03/09/15  9:25 PM  Result Value Ref Range   Glucose-Capillary 88 65 - 99 mg/dL  CBC     Status: Abnormal   Collection Time: 03/09/15 11:39 PM  Result Value Ref Range   WBC 8.3 4.0 - 10.5 K/uL   RBC 3.64 (L) 3.87 - 5.11 MIL/uL   Hemoglobin 9.9 (L) 12.0 - 15.0 g/dL   HCT 32.0 (L) 36.0 - 46.0 %   MCV 87.9 78.0 - 100.0 fL   MCH 27.2 26.0 - 34.0 pg   MCHC 30.9 30.0 - 36.0 g/dL   RDW 14.4 11.5 - 15.5 %   Platelets 242 150 - 400 K/uL  Basic metabolic panel     Status: None   Collection Time: 03/10/15  7:08 AM  Result Value Ref Range   Sodium 142 135 - 145 mmol/L   Potassium 4.4 3.5 - 5.1 mmol/L   Chloride 102 101 - 111 mmol/L   CO2 29 22 - 32 mmol/L   Glucose, Bld 84 65 - 99 mg/dL   BUN 14 6 - 20 mg/dL   Creatinine, Ser 0.88 0.44 - 1.00 mg/dL   Calcium 9.1 8.9 - 10.3 mg/dL   GFR calc non Af Amer >60 >60 mL/min   GFR calc Af Amer >60 >60 mL/min   Anion gap 11 5 - 15  Magnesium     Status: None   Collection Time: 03/10/15  7:08 AM  Result  Value Ref Range   Magnesium 1.7 1.7 - 2.4 mg/dL  Glucose, capillary     Status: None   Collection Time: 03/10/15  7:37 AM  Result Value Ref Range   Glucose-Capillary 67 65 - 99 mg/dL   Comment 1 Notify RN    Comment 2 Document in Chart   Glucose, capillary     Status: Abnormal   Collection Time: 03/10/15  8:30 AM  Result Value Ref Range   Glucose-Capillary 104 (H) 65 - 99 mg/dL  Glucose, capillary     Status: Abnormal   Collection Time: 03/10/15 12:09 PM  Result Value Ref Range   Glucose-Capillary 130 (H) 65 - 99 mg/dL   Comment 1 Notify RN    Comment 2 Document in Chart     Intake/Output Summary (Last 24 hours) at 03/10/15 1332 Last  data filed at 03/10/15 0500  Gross per 24 hour  Intake    480 ml  Output   4650 ml  Net  -4170 ml    ASSESSMENT AND PLAN:  ATRIAL FIB:   TEE/DCCV rescheduled.  Possibly for Thursday.  I will keep her NPO after MN and we can try for tomorrow if some space opens up.  Will ask pharmacy to make the switch to Xarelto from Lovenox.   Rate is still rapid.  I don't think dig is adding so I will discontinue this.    ACUTE DIASTOLIC HF:  Give Lasix 40 mg daily PO.    Ellen Jordan 03/10/2015 1:32 PM

## 2015-03-10 NOTE — Progress Notes (Signed)
[place] for Xarelto  Indication: atrial fibrillation  Patient Measurements: Height: _0  (160 cm) Weight: 184 lb 14.4 oz (83.87 kg) IBW/kg (Calculated) : 52.4  Vital Signs: Temp: 98.4 F (36.9 C) (02/28 1006) Temp Source: Oral (02/28 1006) BP: 116/47 mmHg (02/28 1100) Pulse Rate: 69 (02/28 1100)  Labs:  Recent Labs  03/08/15 0446 03/09/15 0300 03/09/15 2339 03/10/15 0708  HGB  --   --  9.9*  --   HCT  --   --  32.0*  --   PLT  --   --  242  --   CREATININE 0.84 0.82  --  0.88   Estimated Creatinine Clearance: 60.2 mL/min (by C-G formula based on Cr of 0.88).  Medical History: Past Medical History  Diagnosis Date  . Hypertension   . Diabetes mellitus without complication ([place])   . Hypercholesteremia   . Cancer [facility])    Assessment: 70 y/o F with 3 weeks of worsening dyspnea, found to be in afib/RVR, also with PNA per CXR also flu +.  CBC continues to be stable this am, renal function within normal limits, no anti-coagulation PTA. Planning for possible TEE/DCCV on Thursday. Patient has not received Lovenox dose today, will plan to start Xarelto now. No bleeding noted  Goal of Therapy:  Monitor platelets by anticoagulation protocol: Yes   Plan:  -Discontinue Lovenox -Start Xarelto 20 mg daily -Monitor CBC, and for bleeding   Thank you for allowing [place] to participate in this patients care. Jens Som, PharmD Pager: [phone] 03/10/2015 3:00 PM

## 2015-03-10 NOTE — Clinical Social Work Note (Signed)
CSW received handoff. CSW following patient for any discharge needs.   Liz Beach MSW, [place], [place], [phone]

## 2015-03-10 NOTE — Progress Notes (Signed)
Occupational Therapy Treatment Patient Details Name: Ellen Jordan MRN: [id number] DOB: 1945/06/01 Today's Date: 03/10/2015    History of present illness 70 y.o. WF PMHx DM Type 2, HTN, [name] who present with worsening DOE, productive cough and fever. Found to have Spesis, pnuemonia/bibasilar positive influenza B and PNA with acute hypoxic respiratory failure.   OT comments  Pt with HR 140 with ambulation ~5 feet and SOB. Pt very eager for 9 am procedure and to return home Thursday. Pt reports "this has given me a lot to think about. I do need to make some changes"   Follow Up Recommendations  SNF    Equipment Recommendations  3 in 1 bedside comode;Tub/shower seat    Recommendations for Other Services      Precautions / Restrictions Precautions Precautions: Fall Precaution Comments: Monitor HR       Mobility Bed Mobility               General bed mobility comments: in chair on arrival  Transfers Overall transfer level: Needs assistance   Transfers: Sit to/from Stand Sit to Stand: Min guard         General transfer comment: pt insist on walking without RW. pt attempting to reach of hand rails in the hallway.     Balance                                   ADL                                         General ADL Comments: Pt requesting to ambulate and requires rest break after ~ 5 feet of ambulation and demonstrates inability to complete sentence without pause in breath sounds. pt demonstrates HR 140 with mobility. pt educated on [facility] with handout. Pt plans to change water temperature. pt reports "you have given me a lot to think about" pt very much wants to return home on Thursday to her dog "Ellen Jordan"       Vision                     Perception     Praxis      Cognition   Behavior During Therapy: [facility] for tasks assessed/performed Overall Cognitive Status: Within Functional Limits for tasks assessed                       Extremity/Trunk Assessment               Exercises     Shoulder Instructions       General Comments      Pertinent Vitals/ Pain          Home Living                                          Prior Functioning/Environment              Frequency Min 2X/week     Progress Toward Goals  OT Goals(current goals can now be found in the care plan section)  Progress towards OT goals: Progressing toward goals  Acute Rehab OT Goals Patient Stated Goal: to go home to dog  OT Goal  Formulation: With patient Time For Goal Achievement: 03/19/15 Potential to Achieve Goals: Good ADL Goals Pt Will Perform Grooming: with set-up;with supervision;standing Pt Will Perform Upper Body Bathing: with set-up;with supervision;standing;sitting Pt Will Perform Lower Body Bathing: with min guard assist;sit to/from stand Pt Will Perform Upper Body Dressing: with set-up;with supervision;standing;sitting Pt Will Perform Lower Body Dressing: with set-up;with supervision;sit to/from stand Pt Will Transfer to Toilet: with supervision;ambulating;regular height toilet;grab bars Pt Will Perform Toileting - Clothing Manipulation and hygiene: with supervision;sit to/from stand Additional ADL Goal #1: Pt will be aware of energy conservation stratgies from handout that would be of benefit to her  Plan Discharge plan remains appropriate    Co-evaluation                 End of Session     Activity Tolerance Patient limited by fatigue   Patient Left in chair;with call bell/phone within reach;with family/visitor present   Nurse Communication Mobility status;Precautions        Time: 0762-2633 OT Time Calculation (min): 25 min  Charges: OT General Charges $OT Visit: 1 Procedure OT Treatments $Therapeutic Activity: 23-37 mins  Parke Poisson B 03/10/2015, 3:52 PM   Jeri Modena   OTR/L Pager: [phone] Office: [phone] .

## 2015-03-10 NOTE — Progress Notes (Signed)
PROGRESS NOTE  Ellen Jordan:[id number] DOB: [birth year] DOA: 02/28/2015 PCP: Robert Bellow, MD   Admit HPI / Brief Narrative:  70 y.o. female [name], HTN, [name] who presented with 3 weeks of worsening dyspnea on exertion w/ a productive cough and  fever. Found to be septic due to influenza A, she was treated with Tamiflu along with Levaquin initially, she also went into A. fib with RVR and seen by cardiology, she is currently in great control however the plan is to do a TEE and attempt at DC cardioversion on 03/10/2015. Patient was transferred under my care on 03/10/2015 on day 10 of her hospital stay.  HPI/Subjective:  Patient in recliner, denies any headache fever or chills, does have a mild cough, no chest pain or shortness of breath, denies any palpitations, no focal weakness. Does have swelling in her feet and legs.  Assessment/Plan:  Bibasilar Influenza B PNA w/ acute hypoxic respiratory failure  Has completed a course of both Tamiflu and Levaquin - stable from a respiratory standpoint at this time   New onset Afib w/ RVR with [place] vasc 2 score of greater than 4 Cardiology following  - patient is anticoagulated with full dose Lovenox, currently on digoxin, beta blocker - plan for cardioversion on 03/10/2015  Elevated troponin Peaked at 0.3 - explained by her RVR -  has been seen by cardiology , this is not consistent with ACS, demand ischemia due to RVR. On aspirin, statin and beta blocker which are continued.  Due to chronic diastolic CHF with EF 81% due to RVR. Currently on beta blocker and digoxin for rate control, I have applied TED stockings and initiated Lasix for fluid removal. She has 1-2+ pitting edema. Also has evidence of hepatic congestion. Monitor CMP closely.  HTN BP controlled  [name]  currently on Lipitor 20 mg daily continue  Mildly elevated liver enzymes due to hepatic congestion. Repeat CMP in a week.   DM2 Currently on Levemir and sliding  scale will cut down Levimir as a.m. CBGs running little low  CBG (last 3)   Recent Labs  03/09/15 2125 03/10/15 0737 03/10/15 0830  GLUCAP 88 67 104*    Code Status: FULL Family Communication: spoke w/ daughter at bedside on 03/10/2015 Disposition Plan: Based on PT evaluation after her cardioversion on 03/10/2015, likely home health PT  Consultants: PCCM [facility] Cards  Procedures: 2/23 TTE - Left ventricle: mild Concentric hypertrophy - EF 50% to 55% - Left atrium: mildly dilated.  03/10/2015. Scheduled for TEE with cardioversion.  Antibiotics: Aztreonam 2/18 > 2/19 Vanc 2/18 > 2/19 Levaquin 2/18 > 2/23 Tamiflu 2/18 > 2/23  DVT prophylaxis: Full dose lovenox   Objective: Blood pressure 123/60, pulse 54, temperature 98 F (36.7 C), temperature source Oral, resp. rate 18, height 5' 3" (1.6 m), weight 83.87 kg (184 lb 14.4 oz), SpO2 96 %.  Intake/Output Summary (Last 24 hours) at 03/10/15 1003 Last data filed at 03/10/15 0500  Gross per 24 hour  Intake    480 ml  Output   5050 ml  Net  -4570 ml   Exam: General: no acute resp distress - alert and oriented  Lungs: CTA th/o - no wheeze  Cardiovascular: Irregularly irregular Abdomen: Nontender, nondistended, soft, bowel sounds positive, no rebound, no ascites, no appreciable mass Extremities: No significant cyanosis, clubbing, edema bilateral lower extremities  Data Reviewed:  Basic Metabolic Panel:  Recent Labs Lab 03/04/15 0628 03/05/15 0445 03/06/15 0400 03/07/15 0259  03/08/15 0446 03/08/15 0844 03/09/15 0300 03/10/15 0708  NA 136 140 138 141 141  --  137 142  K 4.2 4.2 4.2 4.3 4.2  --  3.8 4.4  CL 110 108 107 107 105  --  101 102  CO2 19* 21* 21* 23 25  --  27 29  GLUCOSE 256* 291* 300* 280* 171*  --  212* 84  BUN _0 --  16 14  CREATININE 0.81 0.74 0.64 0.83 0.84  --  0.82 0.88  CALCIUM 8.5* 8.8* 8.9 9.0 9.0  --  8.6* 9.1  MG 1.9 1.8 1.7 1.6*  --  1.7 1.5* 1.7  PHOS 3.1 3.0 3.5 4.0  --    --   --   --     CBC:  Recent Labs Lab 03/04/15 0628 03/05/15 0445 03/06/15 0400 03/07/15 0259 03/09/15 2339  WBC 6.3 8.0 6.6 8.5 8.3  HGB 10.1* 9.7* 10.0* 10.0* 9.9*  HCT 30.6* 30.8* 29.9* 31.4* 32.0*  MCV 87.7 86.5 87.4 86.0 87.9  PLT 171 215 217 270 242    Liver Function Tests:  Recent Labs Lab 03/04/15 0628 03/05/15 0445 03/06/15 0400 03/07/15 0259 03/08/15 0446  AST 44* 75* 78* 97* 49*  ALT 47 75* 88* 116* 89*  ALKPHOS 58 58 49 56 51  BILITOT 0.6 0.4 0.2* 0.4 0.2*  PROT 6.2* 6.3* 5.8* 6.2* 6.2*  ALBUMIN 2.3* 2.3* 2.2* 2.5* 2.6*   CBG:  Recent Labs Lab 03/09/15 1231 03/09/15 1657 03/09/15 2125 03/10/15 0737 03/10/15 0830  GLUCAP 199* 85 88 67 104*    Recent Results (from the past 240 hour(s))  Blood culture (routine x 2)     Status: None   Collection Time: 02/28/15  2:38 PM  Result Value Ref Range Status   Specimen Description BLOOD RIGHT HAND  Final   Special Requests BOTTLES DRAWN AEROBIC AND ANAEROBIC [place]  Final   Culture NO GROWTH 5 DAYS  Final   Report Status 03/05/2015 FINAL  Final  Blood culture (routine x 2)     Status: None   Collection Time: 02/28/15  2:44 PM  Result Value Ref Range Status   Specimen Description BLOOD LEFT ANTECUBITAL  Final   Special Requests BOTTLES DRAWN AEROBIC AND ANAEROBIC [place]  Final   Culture NO GROWTH 5 DAYS  Final   Report Status 03/05/2015 FINAL  Final  Respiratory virus panel     Status: Abnormal   Collection Time: 02/28/15  6:04 PM  Result Value Ref Range Status   Respiratory Syncytial Virus A Negative Negative Final   Respiratory Syncytial Virus B Negative Negative Final   Influenza A Negative Negative Final   Influenza B Positive (A) Negative Final   Parainfluenza 1 Negative Negative Final   Parainfluenza 2 Negative Negative Final   Parainfluenza 3 Negative Negative Final   Metapneumovirus Negative Negative Final   Rhinovirus Negative Negative Final   Adenovirus Negative Negative Final     Comment: (NOTE) Performed At: [facility] [place], [place] [id number] Lindon Romp MD PB:[id number]   MRSA PCR Screening     Status: None   Collection Time: 02/28/15  9:23 PM  Result Value Ref Range Status   MRSA by PCR NEGATIVE NEGATIVE Final    Comment:        The GeneXpert MRSA Assay (FDA approved for NASAL specimens only), is one component of a comprehensive MRSA colonization surveillance program. It is not intended  to diagnose MRSA infection nor to guide or monitor treatment for MRSA infections.   MRSA PCR Screening     Status: None   Collection Time: 03/02/15 12:29 PM  Result Value Ref Range Status   MRSA by PCR NEGATIVE NEGATIVE Final    Comment:        The GeneXpert MRSA Assay (FDA approved for NASAL specimens only), is one component of a comprehensive MRSA colonization surveillance program. It is not intended to diagnose MRSA infection nor to guide or monitor treatment for MRSA infections.   Culture, sputum-assessment     Status: None   Collection Time: 03/05/15  9:31 AM  Result Value Ref Range Status   Specimen Description SPUTUM  Final   Special Requests Normal  Final   Sputum evaluation   Final    THIS SPECIMEN IS ACCEPTABLE. RESPIRATORY CULTURE REPORT TO FOLLOW.   Report Status 03/05/2015 FINAL  Final  Culture, respiratory (NON-Expectorated)     Status: None   Collection Time: 03/05/15  9:31 AM  Result Value Ref Range Status   Specimen Description SPUTUM  Final   Special Requests NONE  Final   Gram Stain   Final    ABUNDANT WBC PRESENT,BOTH PMN AND MONONUCLEAR FEW SQUAMOUS EPITHELIAL CELLS PRESENT FEW GRAM POSITIVE COCCI IN PAIRS Performed at [facility]    Culture   Final    NORMAL OROPHARYNGEAL FLORA Performed at [facility]    Report Status 03/08/2015 FINAL  Final        Scheduled Meds:  Scheduled Meds: . [MAR Hold] ALPRAZolam  0.5 mg Oral QHS  . [MAR Hold] aspirin EC  81 mg Oral Daily   . [MAR Hold] atorvastatin  20 mg Oral q1800  . [MAR Hold] budesonide (PULMICORT) nebulizer solution  0.5 mg Nebulization BID  . [MAR Hold] dextromethorphan-guaiFENesin  1 tablet Oral BID  . [MAR Hold] digoxin  0.25 mg Oral Daily  . [MAR Hold] enoxaparin (LOVENOX) injection  1 mg/kg Subcutaneous Q12H  . furosemide  40 mg Intravenous Once  . [MAR Hold] insulin aspart  0-20 Units Subcutaneous TID WC  . [MAR Hold] insulin aspart  0-5 Units Subcutaneous QHS  . [MAR Hold] insulin aspart  15 Units Subcutaneous TID AC  . [MAR Hold] insulin detemir  50 Units Subcutaneous QHS  . [MAR Hold] metoprolol tartrate  25 mg Oral 4 times per day  . [MAR Hold] polyethylene glycol  17 g Oral Daily  . [MAR Hold] sertraline  100 mg Oral Daily    Time spent on care of this patient: 35 mins  Signature  Lala Lund K M.D on 03/10/2015 at 10:03 AM  Between 7am to 7pm - Pager - [phone], After 7pm go to [email or web address] - password [email or web address]  [phone]   LOS: 10 days

## 2015-03-10 NOTE — Care Management Important Message (Signed)
Important Message  Patient Details  Name: Ellen Jordan MRN: [id number] Date of Birth: January 15, 1945   Medicare Important Message Given:  Yes    Nathen May 03/10/2015, 3:07 PM

## 2015-03-10 NOTE — Interval H&P Note (Signed)
History and Physical Interval Note:  03/10/2015 9:15 AM  [name]  has presented today for surgery, with the diagnosis of AFIB  The various methods of treatment have been discussed with the patient and family. After consideration of risks, benefits and other options for treatment, the patient has consented to  Procedure(s): TRANSESOPHAGEAL ECHOCARDIOGRAM (TEE) (N/A) CARDIOVERSION (N/A) as a surgical intervention .  The patient's history has been reviewed, patient examined, no change in status, stable for surgery.  I have reviewed the patient's chart and labs.  Questions were answered to the patient's satisfaction.     Ellen Jordan

## 2015-03-10 NOTE — Discharge Instructions (Signed)

## 2015-03-10 NOTE — Progress Notes (Signed)
Paged by nurse, rate in 130s. Asymptomatics. BP stable. Will give metoprolol 45m x 1.   Ferol Laiche, [place]

## 2015-03-10 NOTE — Clinical Social Work Note (Signed)
Patient transferred from [place] to 3W27, this CSW gave report to unit CSW.  This CSW to sign off.  Jones Broom. [place], MSW, [place] 03/10/2015 3:18 PM

## 2015-03-11 ENCOUNTER — Encounter (HOSPITAL_COMMUNITY)
Admission: EM | Disposition: A | Discharge status: Home / Self Care | Payer: Self-pay | Source: Home / Self Care | Attending: Internal Medicine

## 2015-03-11 ENCOUNTER — Inpatient Hospital Stay (HOSPITAL_COMMUNITY): Payer: Commercial Managed Care - HMO | Admitting: Anesthesiology

## 2015-03-11 ENCOUNTER — Encounter (HOSPITAL_COMMUNITY): Payer: Self-pay

## 2015-03-11 ENCOUNTER — Inpatient Hospital Stay (HOSPITAL_COMMUNITY): Payer: Commercial Managed Care - HMO

## 2015-03-11 DIAGNOSIS — I5031 Acute diastolic (congestive) heart failure: Secondary | ICD-10-CM

## 2015-03-11 DIAGNOSIS — I4891 Unspecified atrial fibrillation: Secondary | ICD-10-CM

## 2015-03-11 DIAGNOSIS — I340 Nonrheumatic mitral (valve) insufficiency: Secondary | ICD-10-CM

## 2015-03-11 HISTORY — PX: CARDIOVERSION: SHX1299

## 2015-03-11 HISTORY — PX: TEE WITHOUT CARDIOVERSION: SHX5443

## 2015-03-11 LAB — COMPREHENSIVE METABOLIC PANEL
ALK PHOS: 51 U/L (ref 38–126)
ALT: 46 U/L (ref 14–54)
AST: 26 U/L (ref 15–41)
Albumin: 2.6 g/dL — ABNORMAL LOW (ref 3.5–5.0)
Anion gap: 11 (ref 5–15)
BILIRUBIN TOTAL: 0.4 mg/dL (ref 0.3–1.2)
BUN: 16 mg/dL (ref 6–20)
CALCIUM: 9.2 mg/dL (ref 8.9–10.3)
CO2: 32 mmol/L (ref 22–32)
CREATININE: 1.14 mg/dL — AB (ref 0.44–1.00)
Chloride: 98 mmol/L — ABNORMAL LOW (ref 101–111)
GFR calc Af Amer: 55 mL/min — ABNORMAL LOW (ref >60)
GFR, EST NON AFRICAN AMERICAN: 47 mL/min — AB (ref >60)
GLUCOSE: 165 mg/dL — AB (ref 65–99)
Potassium: 4.1 mmol/L (ref 3.5–5.1)
Sodium: 141 mmol/L (ref 135–145)
TOTAL PROTEIN: 6.4 g/dL — AB (ref 6.5–8.1)

## 2015-03-11 LAB — GLUCOSE, CAPILLARY
GLUCOSE-CAPILLARY: 202 mg/dL — AB (ref 65–99)
Glucose-Capillary: 150 mg/dL — ABNORMAL HIGH (ref 65–99)
Glucose-Capillary: 176 mg/dL — ABNORMAL HIGH (ref 65–99)
Glucose-Capillary: 143 mg/dL — ABNORMAL HIGH (ref 65–99)
Glucose-Capillary: 132 mg/dL — ABNORMAL HIGH (ref 65–99)

## 2015-03-11 LAB — CBC
HEMATOCRIT: 36.1 % (ref 36.0–46.0)
HEMOGLOBIN: 11 g/dL — AB (ref 12.0–15.0)
MCH: 27.2 pg (ref 26.0–34.0)
MCHC: 30.5 g/dL (ref 30.0–36.0)
MCV: 89.1 fL (ref 78.0–100.0)
Platelets: 287 10*3/uL (ref 150–400)
RBC: 4.05 MIL/uL (ref 3.87–5.11)
RDW: 14.5 % (ref 11.5–15.5)
WBC: 7.9 10*3/uL (ref 4.0–10.5)

## 2015-03-11 LAB — MAGNESIUM: Magnesium: 1.8 mg/dL (ref 1.7–2.4)

## 2015-03-11 SURGERY — ECHOCARDIOGRAM, TRANSESOPHAGEAL
Anesthesia: Monitor Anesthesia Care

## 2015-03-11 MED ORDER — SODIUM CHLORIDE 0.9 % IV SOLN: INTRAVENOUS | Status: DC

## 2015-03-11 MED ORDER — PROPOFOL 10 MG/ML IV BOLUS
INTRAVENOUS | Status: DC | PRN
  Administered 2015-03-11: 20 mg via INTRAVENOUS

## 2015-03-11 MED ORDER — SODIUM CHLORIDE 0.9% FLUSH: 3.0000 mL | Freq: Two times a day (BID) | INTRAVENOUS | Status: DC

## 2015-03-11 MED ORDER — PROPOFOL 500 MG/50ML IV EMUL
INTRAVENOUS | Status: DC | PRN
  Administered 2015-03-11: 50 ug/kg/min via INTRAVENOUS

## 2015-03-11 MED ORDER — INSULIN ASPART 100 UNIT/ML ~~LOC~~ SOLN
14.0000 [IU] | Freq: Three times a day (TID) | SUBCUTANEOUS | Status: DC
  Administered 2015-03-11 – 2015-03-12 (×2): 14 [IU] via SUBCUTANEOUS

## 2015-03-11 MED ORDER — SODIUM CHLORIDE 0.9 % IV SOLN
INTRAVENOUS | Status: DC
  Administered 2015-03-11: 500 mL via INTRAVENOUS
  Administered 2015-03-11: 13:00:00 via INTRAVENOUS

## 2015-03-11 MED ORDER — BUTAMBEN-TETRACAINE-BENZOCAINE 2-2-14 % EX AERO
INHALATION_SPRAY | CUTANEOUS | Status: DC | PRN
  Administered 2015-03-11: 2 via TOPICAL

## 2015-03-11 MED ORDER — SODIUM CHLORIDE 0.9% FLUSH: 3.0000 mL | INTRAVENOUS | Status: DC | PRN

## 2015-03-11 MED ORDER — PHENYLEPHRINE HCL 10 MG/ML IJ SOLN
INTRAMUSCULAR | Status: DC | PRN
  Administered 2015-03-11 (×2): 40 ug via INTRAVENOUS

## 2015-03-11 MED ORDER — MAGNESIUM SULFATE IN D5W 10-5 MG/ML-% IV SOLN
1.0000 g | Freq: Once | INTRAVENOUS | Status: AC
  Administered 2015-03-11: 1 g via INTRAVENOUS
  Filled 2015-03-11: qty 100

## 2015-03-11 MED ORDER — SODIUM CHLORIDE 0.9 % IV SOLN: 250.0000 mL | INTRAVENOUS | Status: DC

## 2015-03-11 NOTE — Interval H&P Note (Signed)
History and Physical Interval Note:  03/11/2015 11:06 AM  Ellen Jordan  has presented today for surgery, with the diagnosis of AFIB  The various methods of treatment have been discussed with the patient and family. After consideration of risks, benefits and other options for treatment, the patient has consented to  Procedure(s): TRANSESOPHAGEAL ECHOCARDIOGRAM (TEE) (N/A) CARDIOVERSION (N/A) as a surgical intervention .  The patient's history has been reviewed, patient examined, no change in status, stable for surgery.  I have reviewed the patient's chart and labs.  Questions were answered to the patient's satisfaction.     [name]

## 2015-03-11 NOTE — Progress Notes (Signed)
Physical Therapy Treatment Patient Details Name: Ellen Jordan MRN: [id number] DOB: 1945-02-10 Today's Date: 03/11/2015    History of Present Illness 70 y.o. WF PMHx DM Type 2, HTN, [name] who present with worsening DOE, productive cough and fever. Found to have Spesis, pnuemonia/bibasilar positive influenza B and PNA with acute hypoxic respiratory failure.    PT Comments    Pt performed increased gait distance.  Pt will require rollator at d/c for community use.  D/c recommendations updated to HHPT.    Follow Up Recommendations  Other (comment);Home health PT (Supervising PT aware.  )     Equipment Recommendations  Other (comment) (rollator ( 4 wheeled walker.))    Recommendations for Other Services       Precautions / Restrictions Precautions Precautions: Fall Precaution Comments: Monitor HR Restrictions Weight Bearing Restrictions: No    Mobility  Bed Mobility Overal bed mobility:  (Pt recieved in recliner chair.  )                Transfers Overall transfer level: Needs assistance Equipment used: Rolling walker (2 wheeled);4-wheeled walker Transfers: Sit to/from Stand Sit to Stand: Supervision         General transfer comment: Pt required cues for hand placement and locking brakes on rollator.    Ambulation/Gait Ambulation/Gait assistance: Supervision;Min guard Ambulation Distance (Feet): 150 Feet (+ 80 ft) Assistive device: Rolling walker (2 wheeled);4-wheeled walker Gait Pattern/deviations: Step-through pattern;Decreased stride length;Trunk flexed Gait velocity: decreased   General Gait Details: Very slow with cues to self-monitor for activity tolerance; noting steadier with RW than last session.  performed additional trial with rollator for community independence.  pt noted good safety with rollator with PTA.  Informed supervising PT of need for rollator at d/c.     Stairs Stairs: Yes Stairs assistance: Min guard Stair Management: One rail  Right Number of Stairs: 3 General stair comments: Pt performed with non-reciprocal pattern to ascend and descend 3 stairs.  Pt required cues for sequencing and technique.    Wheelchair Mobility    Modified Rankin (Stroke Patients Only)       Balance     Sitting balance-Leahy Scale: Good       Standing balance-Leahy Scale: Good                      Cognition Arousal/Alertness: Awake/alert Behavior During Therapy: WFL for tasks assessed/performed Overall Cognitive Status: Within Functional Limits for tasks assessed                      Exercises      General Comments        Pertinent Vitals/Pain Pain Assessment: No/denies pain    Home Living                      Prior Function            PT Goals (current goals can now be found in the care plan section) Acute Rehab PT Goals Patient Stated Goal: to go home to dog  Potential to Achieve Goals: Good Progress towards PT goals: Progressing toward goals    Frequency  Min 3X/week    PT Plan Frequency needs to be updated;Discharge plan needs to be updated    Co-evaluation PT/OT/SLP Co-Evaluation/Treatment: Yes           End of Session Equipment Utilized During Treatment: Gait belt Activity Tolerance: Patient tolerated treatment well Patient left: in chair;with call  bell/phone within reach;with family/visitor present     Time: 0768-0881 PT Time Calculation (min) (ACUTE ONLY): 28 min  Charges:  $Gait Training: 23-37 mins                    G Codes:      Cristela Blue 2015-03-20, 4:47 PM  Governor [place], PTA pager [phone]

## 2015-03-11 NOTE — Plan of Care (Signed)
Problem: Safety: Goal: Ability to remain free from injury will improve Outcome: Progressing Patient uses her call light appropriately and is aware of the bed alarm system but doesn't want it on, she will call for assistance as needed. Patient is in the chair at this time and has been in it all night, will continue to monitor

## 2015-03-11 NOTE — Anesthesia Postprocedure Evaluation (Signed)
Anesthesia Post Note  Patient: Ellen Jordan  Procedure(s) Performed: Procedure(s) (LRB): TRANSESOPHAGEAL ECHOCARDIOGRAM (TEE) (N/A) CARDIOVERSION (N/A)  Patient location during evaluation: Endoscopy Anesthesia Type: MAC Level of consciousness: awake and patient cooperative Pain management: pain level controlled Vital Signs Assessment: post-procedure vital signs reviewed and stable Respiratory status: spontaneous breathing Cardiovascular status: blood pressure returned to baseline Postop Assessment: no signs of nausea or vomiting Anesthetic complications: no    Last Vitals:  Filed Vitals:   03/11/15 1238 03/11/15 1240  BP: 111/37 104/58  Pulse: 70 92  Temp:    Resp: 18 21    Last Pain:  Filed Vitals:   03/11/15 1242  PainSc: 2                  Tylik Treese

## 2015-03-11 NOTE — CV Procedure (Signed)
   Transesophageal Echocardiogram Note  Ellen Jordan [id number] 08-31-1945  Procedure: Transesophageal Echocardiogram Indications: atrial fibrillation with RVR  Procedure Details Consent: Obtained Time Out: Verified patient identification, verified procedure, site/side was marked, verified correct patient position, special equipment/implants available, Radiology Safety Procedures followed,  medications/allergies/relevent history reviewed, required imaging and test results available.  Performed  Medications: Propofol 40 mg administered by anesthesia staff  NO thrombus identified in LA or LAA. Full report in TEE dictation.   Complications: Desaturation down to 90% that improved to 100 %. Patient did tolerate procedure well.  [name], MD, [facility] 03/11/2015, 12:40 PM     Cardioversion Note  Ellen Jordan [id number] March 22, 1945  Procedure: DC Cardioversion Indications: atrial fibrillation with RVR  Procedure Details Consent: Obtained Time Out: Verified patient identification, verified procedure, site/side was marked, verified correct patient position, special equipment/implants available, Radiology Safety Procedures followed,  medications/allergies/relevent history reviewed, required imaging and test results available.  Performed  The patient has been on adequate anticoagulation.  The patient received IV Propofol for sedation.  Synchronous cardioversion was performed at 120 joules.  The cardioversion was successful.   Complications: No apparent complications Patient did tolerate procedure well.   [name], MD, [facility] 03/11/2015, 12:40 PM

## 2015-03-11 NOTE — Progress Notes (Signed)
   03/11/15 1130  PT Visit Information  Last PT Received On 03/11/15  Reason Eval/Treat Not Completed Patient at procedure or test/unavailable (Pt off unit for endoscopy.)

## 2015-03-11 NOTE — H&P (View-Only) (Signed)
PROGRESS NOTE  Ellen Jordan QMK:[id number] DOB: 1945/06/11 DOA: 02/28/2015 PCP: Robert Bellow, MD   Admit HPI / Brief Narrative:  70 y.o. female [name], HTN, [name] who presented with 3 weeks of worsening dyspnea on exertion w/ a productive cough and  fever. Found to be septic due to influenza A, she was treated with Tamiflu along with Levaquin initially, she also went into A. fib with RVR and seen by cardiology, she is currently in great control however the plan is to do a TEE and attempt at DC cardioversion on 03/10/2015. Patient was transferred under my care on 03/10/2015 on day 10 of her hospital stay.  HPI/Subjective:  Patient in recliner, denies any headache fever or chills, does have a mild cough, no chest pain or shortness of breath, denies any palpitations, no focal weakness. Does have swelling in her feet and legs.  Assessment/Plan:  Bibasilar Influenza B PNA w/ acute hypoxic respiratory failure  Has completed a course of both Tamiflu and Levaquin - stable from a respiratory standpoint at this time   New onset Afib w/ RVR with [place] vasc 2 score of greater than 4 Cardiology following  - patient is anticoagulated with full dose Lovenox & now Xaralto per Cards, currently on digoxin, beta blocker - plan for cardioversion on 03/11/2015  Elevated troponin Peaked at 0.3 - explained by her RVR -  has been seen by cardiology , this is not consistent with ACS, demand ischemia due to RVR. On aspirin, statin and beta blocker which are continued.  Due to chronic diastolic CHF with EF 86% due to RVR. Currently on beta blocker and digoxin for rate control, I have applied TED stockings and initiated Lasix for fluid removal. She has 1-2+ pitting edema. Also has evidence of hepatic congestion. Monitor CMP closely.  HTN BP controlled on Lopressor  [name]  currently on Lipitor 20 mg daily continue  Mildly elevated liver enzymes due to hepatic congestion. Repeat CMP in a week.    DM2 Currently on Levemir and sliding scale will cut down Levimir as a.m. CBGs were low on 03-10-15  CBG (last 3)   Recent Labs  03/10/15 1957 03/11/15 0548 03/11/15 0734  GLUCAP 171* 150* 176*    Code Status: FULL Family Communication: spoke w/ daughter at bedside on 03/10/2015 Disposition Plan: Based on PT evaluation after her cardioversion on 03/10/2015, likely home health PT  Consultants: [facility] Cards  Procedures: 2/23 TTE - Left ventricle: mild Concentric hypertrophy - EF 50% to 55% - Left atrium: mildly dilated.  03/11/2015. Scheduled for TEE with cardioversion.  Antibiotics: Aztreonam 2/18 > 2/19 Vanc 2/18 > 2/19 Levaquin 2/18 > 2/23 Tamiflu 2/18 > 2/23  DVT prophylaxis: Xaralto   Objective: Blood pressure 119/91, pulse 84, temperature 98.3 F (36.8 C), temperature source Oral, resp. rate 18, height _0  (1.6 m), weight 80.468 kg (177 lb 6.4 oz), SpO2 95 %.  Intake/Output Summary (Last 24 hours) at 03/11/15 0813 Last data filed at 03/11/15 0400  Gross per 24 hour  Intake    720 ml  Output   1050 ml  Net   -330 ml   Exam: General: no acute resp distress - alert and oriented  Lungs: CTA th/o - no wheeze  Cardiovascular: Irregularly irregular Abdomen: Nontender, nondistended, soft, bowel sounds positive, no rebound, no ascites, no appreciable mass Extremities: No significant cyanosis, clubbing, edema bilateral lower extremities  Data Reviewed:  Basic Metabolic Panel:  Recent Labs Lab 03/05/15  [id number] 03/06/15 0400 03/07/15 0259 03/08/15 0446 03/08/15 0844 03/09/15 0300 03/10/15 0708 03/11/15 0425  NA 140 138 141 141  --  137 142 141  K 4.2 4.2 4.3 4.2  --  3.8 4.4 4.1  CL 108 107 107 105  --  101 102 98*  CO2 21* 21* 23 25  --  27 29 32  GLUCOSE 291* 300* 280* 171*  --  212* 84 165*  BUN _0 --  _1 CREATININE 0.74 0.64 0.83 0.84  --  0.82 0.88 1.14*  CALCIUM 8.8* 8.9 9.0 9.0  --  8.6* 9.1 9.2  MG 1.8 1.7 1.6*  --  1.7  1.5* 1.7 1.8  PHOS 3.0 3.5 4.0  --   --   --   --   --     CBC:  Recent Labs Lab 03/05/15 0445 03/06/15 0400 03/07/15 0259 03/09/15 2339 03/11/15 0425  WBC 8.0 6.6 8.5 8.3 7.9  HGB 9.7* 10.0* 10.0* 9.9* 11.0*  HCT 30.8* 29.9* 31.4* 32.0* 36.1  MCV 86.5 87.4 86.0 87.9 89.1  PLT 215 217 270 242 287    Liver Function Tests:  Recent Labs Lab 03/05/15 0445 03/06/15 0400 03/07/15 0259 03/08/15 0446 03/11/15 0425  AST 75* 78* 97* 49* 26  ALT 75* 88* 116* 89* 46  ALKPHOS 58 49 56 51 51  BILITOT 0.4 0.2* 0.4 0.2* 0.4  PROT 6.3* 5.8* 6.2* 6.2* 6.4*  ALBUMIN 2.3* 2.2* 2.5* 2.6* 2.6*   CBG:  Recent Labs Lab 03/10/15 1209 03/10/15 1653 03/10/15 1957 03/11/15 0548 03/11/15 0734  GLUCAP 130* 163* 171* 150* 176*    Recent Results (from the past 240 hour(s))  MRSA PCR Screening     Status: None   Collection Time: 03/02/15 12:29 PM  Result Value Ref Range Status   MRSA by PCR NEGATIVE NEGATIVE Final    Comment:        The GeneXpert MRSA Assay (FDA approved for NASAL specimens only), is one component of a comprehensive MRSA colonization surveillance program. It is not intended to diagnose MRSA infection nor to guide or monitor treatment for MRSA infections.   Culture, sputum-assessment     Status: None   Collection Time: 03/05/15  9:31 AM  Result Value Ref Range Status   Specimen Description SPUTUM  Final   Special Requests Normal  Final   Sputum evaluation   Final    THIS SPECIMEN IS ACCEPTABLE. RESPIRATORY CULTURE REPORT TO FOLLOW.   Report Status 03/05/2015 FINAL  Final  Culture, respiratory (NON-Expectorated)     Status: None   Collection Time: 03/05/15  9:31 AM  Result Value Ref Range Status   Specimen Description SPUTUM  Final   Special Requests NONE  Final   Gram Stain   Final    ABUNDANT WBC PRESENT,BOTH PMN AND MONONUCLEAR FEW SQUAMOUS EPITHELIAL CELLS PRESENT FEW GRAM POSITIVE COCCI IN PAIRS Performed at [facility]    Culture    Final    NORMAL OROPHARYNGEAL FLORA Performed at [facility]    Report Status 03/08/2015 FINAL  Final        Scheduled Meds:  Scheduled Meds: . ALPRAZolam  0.5 mg Oral QHS  . atorvastatin  20 mg Oral q1800  . budesonide (PULMICORT) nebulizer solution  0.5 mg Nebulization BID  . dextromethorphan-guaiFENesin  1 tablet Oral BID  . furosemide  40 mg Oral Daily  . insulin aspart  0-20 Units Subcutaneous TID WC  .  insulin aspart  0-5 Units Subcutaneous QHS  . insulin aspart  12 Units Subcutaneous TID AC  . insulin detemir  40 Units Subcutaneous QHS  . magnesium sulfate 1 - 4 g bolus IVPB  1 g Intravenous Once  . metoprolol tartrate  25 mg Oral 4 times per day  . polyethylene glycol  17 g Oral Daily  . rivaroxaban  20 mg Oral Q supper  . sertraline  100 mg Oral Daily    Time spent on care of this patient: 35 mins  Signature  Lala Lund K M.D on 03/11/2015 at 8:13 AM  Between 7am to 7pm - Pager - [phone], After 7pm go to [email or web address] - password [email or web address]  [phone]   LOS: 11 days

## 2015-03-11 NOTE — Transfer of Care (Signed)
Immediate Anesthesia Transfer of Care Note  Patient: Ellen Jordan  Procedure(s) Performed: Procedure(s): TRANSESOPHAGEAL ECHOCARDIOGRAM (TEE) (N/A) CARDIOVERSION (N/A)  Patient Location: PACU and Endoscopy Unit  Anesthesia Type:MAC  Level of Consciousness: awake, sedated and responds to stimulation  Airway & Oxygen Therapy: Patient Spontanous Breathing and Patient connected to nasal cannula oxygen  Post-op Assessment: Report given to RN and Post -op Vital signs reviewed and stable  Post vital signs: Reviewed and stable  Last Vitals:  Filed Vitals:   03/11/15 1238 03/11/15 1240  BP: 111/37 104/58  Pulse: 70 92  Temp:    Resp: 18 21    Complications: No apparent anesthesia complications

## 2015-03-11 NOTE — Progress Notes (Signed)
Patient Profile: 70 y.o. female with a h/o diabetes, hypertension and hyperlipidemia who presented to the hospital on 2/18 with 3 weeks of worsening dyspnea, found to be in atrial fibrillation w/ RVR.    Subjective: No major complaints. No resting dyspnea. She continues to have DOE.   Objective: Vital signs in last 24 hours: Temp:  [97.6 F (36.4 C)-98.4 F (36.9 C)] 98.3 F (36.8 C) (03/01 0346) Pulse Rate:  [69-120] 84 (03/01 0707) Resp:  [17-22] 18 (03/01 0346) BP: (90-124)/(47-91) 119/91 mmHg (03/01 0707) SpO2:  [93 %-99 %] 95 % (03/01 0346) Weight:  [177 lb 6.4 oz (80.468 kg)] 177 lb 6.4 oz (80.468 kg) (03/01 0400) Last BM Date: 03/10/15  Intake/Output from previous day: 02/28 0701 - 03/01 0700 In: 720 [P.O.:720] Out: 1050 [Urine:1050] Intake/Output this shift:    Medications Current Facility-Administered Medications  Medication Dose Route Frequency Provider Last Rate Last Dose  . ALPRAZolam [name]) tablet 0.5 mg  0.5 mg Oral QHS Allie Bossier, MD   0.5 mg at 03/10/15 2207  . atorvastatin (LIPITOR) tablet 20 mg  20 mg Oral q1800 Lyndee Leo, RPH   20 mg at 03/10/15 1738  . benzonatate (TESSALON) capsule 200 mg  200 mg Oral TID PRN Cherene Altes, MD   200 mg at 03/08/15 2208  . budesonide (PULMICORT) nebulizer solution 0.5 mg  0.5 mg Nebulization BID [place], MD   0.5 mg at 03/10/15 2024  . chlorpheniramine-HYDROcodone (TUSSIONEX) 10-8 MG/5ML suspension 5 mL  5 mL Oral Q12H PRN Cherene Altes, MD   5 mL at 03/10/15 0259  . dextromethorphan-guaiFENesin (MUCINEX DM) 30-600 MG per 12 hr tablet 1 tablet  1 tablet Oral BID Allie Bossier, MD   1 tablet at 03/10/15 2207  . furosemide (LASIX) tablet 40 mg  40 mg Oral Daily Minus Breeding, MD   40 mg at 03/10/15 1547  . insulin aspart (novoLOG) injection 0-20 Units  0-20 Units Subcutaneous TID WC Cherene Altes, MD   4 Units at 03/10/15 1740  . insulin aspart (novoLOG) injection 0-5 Units  0-5 Units  Subcutaneous QHS Truett Mainland, DO   3 Units at 03/05/15 2130  . insulin aspart (novoLOG) injection 14 Units  14 Units Subcutaneous TID AC Thurnell Lose, MD      . insulin detemir (LEVEMIR) injection 40 Units  40 Units Subcutaneous QHS Thurnell Lose, MD   40 Units at 03/10/15 2207  . levalbuterol (XOPENEX) nebulizer solution 0.63 mg  0.63 mg Nebulization Q3H PRN Rahul P Desai, PA-C   0.63 mg at 03/08/15 1500  . magnesium sulfate IVPB 1 g 100 mL  1 g Intravenous Once Thurnell Lose, MD      . menthol-cetylpyridinium (CEPACOL) lozenge 3 mg  1 lozenge Oral PRN Fransico Meadow, PA-C      . metoprolol tartrate (LOPRESSOR) tablet 25 mg  25 mg Oral 4 times per day Will Meredith Leeds, MD   25 mg at 03/11/15 0707  . morphine 2 MG/ML injection 1-2 mg  1-2 mg Intravenous Q3H PRN Rahul P Desai, PA-C   2 mg at 03/03/15 1634  . polyethylene glycol (MIRALAX / GLYCOLAX) packet 17 g  17 g Oral Daily Allie Bossier, MD   17 g at 03/08/15 0900  . rivaroxaban (XARELTO) tablet 20 mg  20 mg Oral Q supper Thurnell Lose, MD   20 mg at 03/10/15 1548  . sertraline (ZOLOFT)  tablet 100 mg  100 mg Oral Daily Tanna Savoy Stinson, DO   100 mg at 03/09/15 1055    PE: General appearance: alert, cooperative and no distress Neck: no carotid bruit and no JVD Lungs: clear to auscultation bilaterally Heart: irregularly irregular rhythm Extremities: no LEE Pulses: 2+ and symmetric Skin: warm and dry Neurologic: Grossly normal  Lab Results:   Recent Labs  03/09/15 2339 03/11/15 0425  WBC 8.3 7.9  HGB 9.9* 11.0*  HCT 32.0* 36.1  PLT 242 287   BMET  Recent Labs  03/09/15 0300 03/10/15 0708 03/11/15 0425  NA 137 142 141  K 3.8 4.4 4.1  CL 101 102 98*  CO2 27 29 32  GLUCOSE 212* 84 165*  BUN _0 CREATININE 0.82 0.88 1.14*  CALCIUM 8.6* 9.1 9.2     Assessment/Plan    Active Problems:   Uncontrolled type 2 diabetes mellitus without complication, with long-term current use of insulin (HCC)    Atrial fibrillation with rapid ventricular response (HCC)   Elevated troponin I level   Community acquired pneumonia   Acute respiratory failure with hypoxia (HCC)   Lactic acidosis   Sepsis ([employer])   Respiratory failure ([place])   CAP (community acquired pneumonia)   Dyspnea   Influenza with pneumonia   Dehydration   Sepsis due to pneumonia ([place])   [name] (hyperlipidemia)   1. Atrial Fibrillation w/ RVR: TEE guided DCCV completed. Continue Xarelto.   2. Acute Diastolic CHF: continue lasix, 40 mg PO daily.    LOS: 11 days    Brittainy M. Rosita Fire, PA-C 03/11/2015 9:05 AM  History and all data above reviewed.  Patient examined.  I agree with the findings as above.   No chest pain.  Coughing.  The patient exam reveals COR:RRR  ,  Lungs: Clear  ,  Abd: Positive bowel sounds, no rebound no guarding, Ext Trace edema  .  All available labs, radiology testing, previous records reviewed. Agree with documented assessment and plan. Atrial fib:  Now back in NSR.  Continue current therapy.  Will follow heart rate but for tonight continue current beta blocker dose.  (Would be OK to discharge from our standpoint when primary team is ready.)    Minus Breeding  3:07 PM  03/11/2015

## 2015-03-11 NOTE — Progress Notes (Signed)
  Echocardiogram Echocardiogram Transesophageal has been performed.  Ellen Jordan 03/11/2015, 12:49 PM

## 2015-03-11 NOTE — Anesthesia Preprocedure Evaluation (Addendum)
Anesthesia Evaluation  Patient identified by MRN, date of birth, ID band Patient awake    Reviewed: Allergy & Precautions, NPO status   Airway Mallampati: II  TM Distance: >3 FB Neck ROM: Full    Dental  (+) Edentulous Upper   Pulmonary shortness of breath, former smoker,     + decreased breath sounds      Cardiovascular hypertension, + dysrhythmias Atrial Fibrillation  Rhythm:Irregular Rate:Normal     Neuro/Psych    GI/Hepatic negative GI ROS, Neg liver ROS,   Endo/Other  diabetes  Renal/GU negative Renal ROS     Musculoskeletal   Abdominal (+) + obese,   Peds  Hematology   Anesthesia Other Findings   Reproductive/Obstetrics                           Anesthesia Physical Anesthesia Plan  ASA: III  Anesthesia Plan: MAC   Post-op Pain Management:    Induction: Intravenous  Airway Management Planned: Natural Airway and Nasal Cannula  Additional Equipment:   Intra-op Plan:   Post-operative Plan:   Informed Consent: I have reviewed the patients History and Physical, chart, labs and discussed the procedure including the risks, benefits and alternatives for the proposed anesthesia with the patient or authorized representative who has indicated his/her understanding and acceptance.   Dental advisory given  Plan Discussed with: CRNA, Surgeon and Anesthesiologist  Anesthesia Plan Comments:        Anesthesia Quick Evaluation

## 2015-03-11 NOTE — Progress Notes (Signed)
  PROGRESS NOTE  Ellen Jordan MRN:[id number] DOB: 01/01/1946 DOA: 02/28/2015 PCP: KNOWLTON,STEPHEN D, MD   Admit HPI / Brief Narrative:  70 y.o. female [name], HTN, [name] who presented with 3 weeks of worsening dyspnea on exertion w/ a productive cough and  fever. Found to be septic due to influenza A, she was treated with Tamiflu along with Levaquin initially, she also went into A. fib with RVR and seen by cardiology, she is currently in great control however the plan is to do a TEE and attempt at DC cardioversion on 03/10/2015. Patient was transferred under my care 70 on 03/10/2015 on day 10 of her hospital stay.  HPI/Subjective:  Patient in recliner, denies any headache fever or chills, does have a mild cough, no chest pain or shortness of breath, denies any palpitations, no focal weakness. Does have swelling in her feet and legs.  Assessment/Plan:  Bibasilar Influenza B PNA w/ acute hypoxic respiratory failure  Has completed a course of both Tamiflu and Levaquin - stable from a respiratory standpoint at this time   New onset Afib w/ RVR with [place] vasc 2 score of greater than 4 Cardiology following  - patient is anticoagulated with full dose Lovenox & now Xaralto per Cards, currently on digoxin, beta blocker - plan for cardioversion on 03/11/2015  Elevated troponin Peaked at 0.3 - explained by her RVR -  has been seen by cardiology , this is not consistent with ACS, demand ischemia due to RVR. On aspirin, statin and beta blocker which are continued.  Due to chronic diastolic CHF with EF 55% due to RVR. Currently on beta blocker and digoxin for rate control, I have applied TED stockings and initiated Lasix for fluid removal. She has 1-2+ pitting edema. Also has evidence of hepatic congestion. Monitor CMP closely.  HTN BP controlled on Lopressor  [name]  currently on Lipitor 20 mg daily continue  Mildly elevated liver enzymes due to hepatic congestion. Repeat CMP in a week.    DM2 Currently on Levemir and sliding scale will cut down Levimir as a.m. CBGs were low on 03-10-15  CBG (last 3)   Recent Labs  03/10/15 1957 03/11/15 0548 03/11/15 0734  GLUCAP 171* 150* 176*    Code Status: FULL Family Communication: spoke w/ daughter at bedside on 03/10/2015 Disposition Plan: Based on PT evaluation after her cardioversion on 03/10/2015, likely home health PT  Consultants: [facility] Cards  Procedures: 2/23 TTE - Left ventricle: mild Concentric hypertrophy - EF 50% to 55% - Left atrium: mildly dilated.  03/11/2015. Scheduled for TEE with cardioversion.  Antibiotics: Aztreonam 2/18 > 2/19 Vanc 2/18 > 2/19 Levaquin 2/18 > 2/23 Tamiflu 2/18 > 2/23  DVT prophylaxis: Xaralto   Objective: Blood pressure 119/91, pulse 84, temperature 98.3 F (36.8 C), temperature source Oral, resp. rate 18, height 5' 3" (1.6 m), weight 80.468 kg (177 lb 6.4 oz), SpO2 95 %.  Intake/Output Summary (Last 24 hours) at 03/11/15 0813 Last data filed at 03/11/15 0400  Gross per 24 hour  Intake    720 ml  Output   1050 ml  Net   -330 ml   Exam: General: no acute resp distress - alert and oriented  Lungs: CTA th/o - no wheeze  Cardiovascular: Irregularly irregular Abdomen: Nontender, nondistended, soft, bowel sounds positive, no rebound, no ascites, no appreciable mass Extremities: No significant cyanosis, clubbing, edema bilateral lower extremities  Data Reviewed:  Basic Metabolic Panel:  Recent Labs Lab 03/05/15   0445 03/06/15 0400 03/07/15 0259 03/08/15 0446 03/08/15 0844 03/09/15 0300 03/10/15 0708 03/11/15 0425  NA 140 138 141 141  --  137 142 141  K 4.2 4.2 4.3 4.2  --  3.8 4.4 4.1  CL 108 107 107 105  --  101 102 98*  CO2 21* 21* 23 25  --  27 29 32  GLUCOSE 291* 300* 280* 171*  --  212* 84 165*  BUN 17 18 18 18  --  16 14 16  CREATININE 0.74 0.64 0.83 0.84  --  0.82 0.88 1.14*  CALCIUM 8.8* 8.9 9.0 9.0  --  8.6* 9.1 9.2  MG 1.8 1.7 1.6*  --  1.7  1.5* 1.7 1.8  PHOS 3.0 3.5 4.0  --   --   --   --   --     CBC:  Recent Labs Lab 03/05/15 0445 03/06/15 0400 03/07/15 0259 03/09/15 2339 03/11/15 0425  WBC 8.0 6.6 8.5 8.3 7.9  HGB 9.7* 10.0* 10.0* 9.9* 11.0*  HCT 30.8* 29.9* 31.4* 32.0* 36.1  MCV 86.5 87.4 86.0 87.9 89.1  PLT 215 217 270 242 287    Liver Function Tests:  Recent Labs Lab 03/05/15 0445 03/06/15 0400 03/07/15 0259 03/08/15 0446 03/11/15 0425  AST 75* 78* 97* 49* 26  ALT 75* 88* 116* 89* 46  ALKPHOS 58 49 56 51 51  BILITOT 0.4 0.2* 0.4 0.2* 0.4  PROT 6.3* 5.8* 6.2* 6.2* 6.4*  ALBUMIN 2.3* 2.2* 2.5* 2.6* 2.6*   CBG:  Recent Labs Lab 03/10/15 1209 03/10/15 1653 03/10/15 1957 03/11/15 0548 03/11/15 0734  GLUCAP 130* 163* 171* 150* 176*    Recent Results (from the past 240 hour(s))  MRSA PCR Screening     Status: None   Collection Time: 03/02/15 12:29 PM  Result Value Ref Range Status   MRSA by PCR NEGATIVE NEGATIVE Final    Comment:        The GeneXpert MRSA Assay (FDA approved for NASAL specimens only), is one component of a comprehensive MRSA colonization surveillance program. It is not intended to diagnose MRSA infection nor to guide or monitor treatment for MRSA infections.   Culture, sputum-assessment     Status: None   Collection Time: 03/05/15  9:31 AM  Result Value Ref Range Status   Specimen Description SPUTUM  Final   Special Requests Normal  Final   Sputum evaluation   Final    THIS SPECIMEN IS ACCEPTABLE. RESPIRATORY CULTURE REPORT TO FOLLOW.   Report Status 03/05/2015 FINAL  Final  Culture, respiratory (NON-Expectorated)     Status: None   Collection Time: 03/05/15  9:31 AM  Result Value Ref Range Status   Specimen Description SPUTUM  Final   Special Requests NONE  Final   Gram Stain   Final    ABUNDANT WBC PRESENT,BOTH PMN AND MONONUCLEAR FEW SQUAMOUS EPITHELIAL CELLS PRESENT FEW GRAM POSITIVE COCCI IN PAIRS Performed at [facility]    Culture    Final    NORMAL OROPHARYNGEAL FLORA Performed at [facility]    Report Status 03/08/2015 FINAL  Final        Scheduled Meds:  Scheduled Meds: . ALPRAZolam  0.5 mg Oral QHS  . atorvastatin  20 mg Oral q1800  . budesonide (PULMICORT) nebulizer solution  0.5 mg Nebulization BID  . dextromethorphan-guaiFENesin  1 tablet Oral BID  . furosemide  40 mg Oral Daily  . insulin aspart  0-20 Units Subcutaneous TID WC  .   insulin aspart  0-5 Units Subcutaneous QHS  . insulin aspart  12 Units Subcutaneous TID AC  . insulin detemir  40 Units Subcutaneous QHS  . magnesium sulfate 1 - 4 g bolus IVPB  1 g Intravenous Once  . metoprolol tartrate  25 mg Oral 4 times per day  . polyethylene glycol  17 g Oral Daily  . rivaroxaban  20 mg Oral Q supper  . sertraline  100 mg Oral Daily    Time spent on care of this patient: 35 mins  Signature  SINGH,PRASHANT K M.D on 03/11/2015 at 8:13 AM  Between 7am to 7pm - Pager - [phone], After 7pm go to [email or web address] - password [email or web address]  [facility]  - Office  [phone]   LOS: 11 days       

## 2015-03-11 NOTE — Progress Notes (Addendum)
Physical Therapy  Note   Discussed Ms. Vanengen's progress and plan of care with Aimee, PTA;   Making good progress with functional mobility -- will update plan to home with HHPT follow-up;   Will update DC equipment recommendations to a Rollator RW for greater community access;   Please refer to PT treatment note of this date for more details re: functional status;   Thank you,  Roney Marion, [place] Pager [phone] Office [phone]

## 2015-03-12 ENCOUNTER — Encounter (HOSPITAL_COMMUNITY): Payer: Self-pay | Admitting: Cardiology

## 2015-03-12 LAB — BASIC METABOLIC PANEL
Anion gap: 7 (ref 5–15)
BUN: 15 mg/dL (ref 6–20)
CO2: 30 mmol/L (ref 22–32)
Calcium: 8.7 mg/dL — ABNORMAL LOW (ref 8.9–10.3)
Chloride: 101 mmol/L (ref 101–111)
Creatinine, Ser: 1 mg/dL (ref 0.44–1.00)
GFR calc Af Amer: >60 mL/min (ref >60)
GFR calc non Af Amer: 55 mL/min — ABNORMAL LOW (ref >60)
Glucose, Bld: 260 mg/dL — ABNORMAL HIGH (ref 65–99)
Potassium: 3.7 mmol/L (ref 3.5–5.1)
Sodium: 138 mmol/L (ref 135–145)

## 2015-03-12 LAB — MAGNESIUM: Magnesium: 1.7 mg/dL (ref 1.7–2.4)

## 2015-03-12 LAB — GLUCOSE, CAPILLARY
GLUCOSE-CAPILLARY: 170 mg/dL — AB (ref 65–99)
GLUCOSE-CAPILLARY: 187 mg/dL — AB (ref 65–99)

## 2015-03-12 MED ORDER — RIVAROXABAN 20 MG PO TABS
20.0000 mg | ORAL_TABLET | Freq: Every day | ORAL | Status: DC
Start: 2015-03-12 — End: 2016-04-29

## 2015-03-12 MED ORDER — METOPROLOL TARTRATE 50 MG PO TABS: 50.0000 mg | ORAL_TABLET | Freq: Two times a day (BID) | ORAL | Status: DC

## 2015-03-12 MED ORDER — FUROSEMIDE 40 MG PO TABS: 40.0000 mg | ORAL_TABLET | Freq: Every day | ORAL | Status: DC

## 2015-03-12 NOTE — Care Management Note (Addendum)
Case Management Note  Patient Details  Name: Ellen Jordan MRN: [id number] Date of Birth: Oct 04, 1945  Subjective/Objective: Pt admitted for A fib with RVR- Pt is from home and has support of family. Pt will need DME Rollator- Daughter wants to know price before delivery. CM did call [facility] Liaison for cost. DME to be delivered to room before d/c.                   Action/Plan: Pt is agreeable to [facility] with [facility] as well. Referral was made with But[facility]. SOC to begin within 24-48 hours post d/c. No co pay for skilled services and daughter was made aware. No further needs from CM at this time.   Expected Discharge Date:  03/06/15               Expected Discharge Plan:  [place]  In-House Referral:  NA  Discharge planning Services  CM Consult  Post Acute Care Choice:  Home Health, Durable Medical Equipment Choice offered to:  Patient, Adult Children  DME Arranged:  Walker rolling with seat DME Agency:  [place]:  PT [place] Agency:  [place]  Status of Service:  Completed, signed off  Medicare Important Message Given:  Yes Date Medicare IM Given:    Medicare IM give by:    Date Additional Medicare IM Given:    Additional Medicare Important Message give by:     If discussed at [place] of Stay Meetings, dates discussed:    Additional Comments:1240 03-12-15 Jacqlyn Krauss, RN,BSN [phone] CM did provide pt with 30 day free Xarelto card. CM did call [facility] in [place] to make sure medication is available and medication is available at cost of $47.00  1119 03-12-15 Jacqlyn Krauss, RN, BSN [phone] Co pay for RW rollator will be 20.00. Daughter made aware -ok for [facility] to deliver. No further needs from CM.  Bethena Roys, RN 03/12/2015, 10:00 AM

## 2015-03-12 NOTE — Progress Notes (Signed)
[place] for Xarelto  Indication: atrial fibrillation  Patient Measurements: Height: _0  (160 cm) Weight: 174 lb 9.6 oz (79.198 kg) IBW/kg (Calculated) : 52.4  Vital Signs: Temp: 98.6 F (37 C) (03/02 0932) Temp Source: Oral (03/02 0932) BP: 113/39 mmHg (03/02 0932) Pulse Rate: 74 (03/02 0932)  Labs:  Recent Labs  03/09/15 2339 03/10/15 0708 03/11/15 0425 03/12/15 0449  HGB 9.9*  --  11.0*  --   HCT 32.0*  --  36.1  --   PLT 242  --  287  --   CREATININE  --  0.88 1.14* 1.00   Estimated Creatinine Clearance: 51.4 mL/min (by C-G formula based on Cr of 1).  Medical History: Past Medical History  Diagnosis Date  . Hypertension   . Diabetes mellitus without complication ([place])   . Hypercholesteremia   . Cancer [facility])    Assessment: 70 y/o F continuing on Xarelto (new) for afib. S/p successful DCCV on 3/1. CBC ok, no bleed documented. CrCl using TBW~64.  Goal of Therapy:  Monitor platelets by anticoagulation protocol: Yes   Plan:  -Start Xarelto 20 mg daily -Monitor CBC, s/sx bleeding  Ellen Jordan, PharmD, BCPS Clinical Pharmacist Pager [phone] 03/12/2015 11:01 AM

## 2015-03-12 NOTE — Progress Notes (Signed)
Patient Profile: 70 y.o. female with a h/o diabetes, hypertension and hyperlipidemia who presented to the hospital on 2/18 with 3 weeks of worsening dyspnea, found to be in atrial fibrillation w/ RVR. S/p successful TEE/DCCV 03/11/15.   Subjective: No complaints. Feels better. No CP or dyspnea.   Objective: Vital signs in last 24 hours: Temp:  [97.5 F (36.4 C)-98.8 F (37.1 C)] 98.8 F (37.1 C) (03/02 0400) Pulse Rate:  [69-104] 76 (03/02 0636) Resp:  [17-21] 20 (03/01 2000) BP: (102-130)/(37-58) 124/48 mmHg (03/02 0636) SpO2:  [95 %-100 %] 95 % (03/02 0400) Weight:  [174 lb 9.6 oz (79.198 kg)] 174 lb 9.6 oz (79.198 kg) (03/02 3810) Last BM Date: 03/10/15  Intake/Output from previous day: 03/01 0701 - 03/02 0700 In: 1605 [P.O.:1080; I.V.:525] Out: 4200 [Urine:4200] Intake/Output this shift:    Medications Current Facility-Administered Medications  Medication Dose Route Frequency Provider Last Rate Last Dose  . ALPRAZolam [name]) tablet 0.5 mg  0.5 mg Oral QHS Allie Bossier, MD   0.5 mg at 03/11/15 2206  . atorvastatin (LIPITOR) tablet 20 mg  20 mg Oral q1800 Lyndee Leo, RPH   20 mg at 03/11/15 1733  . benzonatate (TESSALON) capsule 200 mg  200 mg Oral TID PRN Cherene Altes, MD   200 mg at 03/08/15 2208  . budesonide (PULMICORT) nebulizer solution 0.5 mg  0.5 mg Nebulization BID [place], MD   0.5 mg at 03/11/15 2020  . chlorpheniramine-HYDROcodone (TUSSIONEX) 10-8 MG/5ML suspension 5 mL  5 mL Oral Q12H PRN Cherene Altes, MD   5 mL at 03/10/15 0259  . dextromethorphan-guaiFENesin (MUCINEX DM) 30-600 MG per 12 hr tablet 1 tablet  1 tablet Oral BID Allie Bossier, MD   1 tablet at 03/11/15 2206  . furosemide (LASIX) tablet 40 mg  40 mg Oral Daily Minus Breeding, MD   40 mg at 03/11/15 1407  . insulin aspart (novoLOG) injection 0-20 Units  0-20 Units Subcutaneous TID WC Cherene Altes, MD   7 Units at 03/11/15 1735  . insulin aspart (novoLOG)  injection 0-5 Units  0-5 Units Subcutaneous QHS Truett Mainland, DO   3 Units at 03/05/15 2130  . insulin aspart (novoLOG) injection 14 Units  14 Units Subcutaneous TID AC Thurnell Lose, MD   14 Units at 03/11/15 1735  . insulin detemir (LEVEMIR) injection 40 Units  40 Units Subcutaneous QHS Thurnell Lose, MD   40 Units at 03/11/15 2304  . levalbuterol (XOPENEX) nebulizer solution 0.63 mg  0.63 mg Nebulization Q3H PRN Rahul P Desai, PA-C   0.63 mg at 03/08/15 1500  . menthol-cetylpyridinium (CEPACOL) lozenge 3 mg  1 lozenge Oral PRN Fransico Meadow, PA-C      . metoprolol tartrate (LOPRESSOR) tablet 25 mg  25 mg Oral 4 times per day Will Meredith Leeds, MD   25 mg at 03/12/15 0636  . morphine 2 MG/ML injection 1-2 mg  1-2 mg Intravenous Q3H PRN Rahul P Desai, PA-C   2 mg at 03/03/15 1634  . polyethylene glycol (MIRALAX / GLYCOLAX) packet 17 g  17 g Oral Daily Allie Bossier, MD   17 g at 03/08/15 0900  . rivaroxaban (XARELTO) tablet 20 mg  20 mg Oral Q supper Thurnell Lose, MD   20 mg at 03/11/15 1734  . sertraline (ZOLOFT) tablet 100 mg  100 mg Oral Daily Tanna Savoy Stinson, DO   100 mg at 03/11/15  1409    PE: General appearance: alert, cooperative and no distress Neck: no carotid bruit and no JVD Lungs: clear to auscultation bilaterally Heart: regular rate and rhythm, S1, S2 normal, no murmur, click, rub or gallop Extremities: no LEE, wearing compression stockings Pulses: 2+ and symmetric Skin: warm and dry Neurologic: Grossly normal  Lab Results:   Recent Labs  03/09/15 2339 03/11/15 0425  WBC 8.3 7.9  HGB 9.9* 11.0*  HCT 32.0* 36.1  PLT 242 287   BMET  Recent Labs  03/10/15 0708 03/11/15 0425 03/12/15 0449  NA 142 141 138  K 4.4 4.1 3.7  CL 102 98* 101  CO2 29 32 30  GLUCOSE 84 165* 260*  BUN _0 CREATININE 0.88 1.14* 1.00  CALCIUM 9.1 9.2 8.7*    Studies/Results: TEE 03/11/15 Study Conclusions  - Left ventricle: Systolic function was normal. The  estimated ejection fraction was in the range of 50% to 55%. Wall motion was normal; there were no regional wall motion abnormalities. - Aortic valve: No evidence of vegetation. No evidence of vegetation. - Aorta: The aorta was normal, not dilated, there were moderate non-mobile calcifications. - Mitral valve: Mildly calcified annulus. Mildly thickened leaflets . There was mild regurgitation. - Left atrium: The atrium was dilated. No evidence of thrombus in the atrial cavity or appendage. No evidence of thrombus in the atrial cavity or appendage. No evidence of thrombus in the atrial cavity or appendage. The appendage was morphologically a left appendage, multilobulated, and of normal size. Emptying velocity were decreased. - Right atrium: No evidence of thrombus in the atrial cavity or appendage. - Atrial septum: There was increased thickness of the septum, consistent with lipomatous hypertrophy.  Impressions:  - TEE didn&'t identify any source of intracardiac thrmbus. A successful cardioversion followed the TEE.  Cardioversion Note  Ellen Jordan [id number] [birth year]  Procedure: DC Cardioversion Indications: atrial fibrillation with RVR  Procedure Details Consent: Obtained Time Out: Verified patient identification, verified procedure, site/side was marked, verified correct patient position, special equipment/implants available, Radiology Safety Procedures followed, medications/allergies/relevent history reviewed, required imaging and test results available. Performed  The patient has been on adequate anticoagulation. The patient received IV Propofol for sedation. Synchronous cardioversion was performed at 120 joules.  The cardioversion was successful.   Complications: No apparent complications Patient did tolerate procedure well.   [name], MD, [facility] 03/11/2015, 12:40 PM   Assessment/Plan  Active Problems:   Uncontrolled  type 2 diabetes mellitus without complication, with long-term current use of insulin (HCC)   Atrial fibrillation with rapid ventricular response (HCC)   Elevated troponin I level   Community acquired pneumonia   Acute respiratory failure with hypoxia (HCC)   Lactic acidosis   Sepsis ([place])   Respiratory failure ([place])   CAP (community acquired pneumonia)   Dyspnea   Influenza with pneumonia   Dehydration   Sepsis due to pneumonia ([place])   [name] (hyperlipidemia)   1. Atrial Fibrillation w/ RVR: TEE guided DCCV completed and was successful. She is maintaining NSR. HR is well controlled.  Continue metoprolol and  Xarelto.   2. Acute Diastolic CHF: continue lasix, 40 mg PO daily.   Dispo: patient is stable from a cardiac standpoint for d/c. We will arrange f/u in our office for repeat assessment and repeat EKG.     LOS: 12 days    Brittainy M. Ladoris Gene 03/12/2015 8:18 AM  History and all data above reviewed.  Patient examined.  I  agree with the findings as above.  She wants to go home.  No chest pain.  Mild SOB but up in room and doing daily hygiene.   The patient exam reveals COR:RRR  ,  Lungs: Clear  ,  Abd: Positive bowel sounds, no rebound no guarding, Ext Mild edema  .  All available labs, radiology testing, previous records reviewed. Agree with documented assessment and plan. Atrial fib:  Maintaining NSR.  OK to go home from our standpoint as above.    Jeneen Rinks Jerame Hedding  10:16 AM  03/12/2015

## 2015-03-12 NOTE — Discharge Summary (Signed)
Physician Discharge Summary  Ellen Jordan XLK:[id number] DOB: [birth year] DOA: 02/28/2015  PCP: Robert Bellow, MD  Admit date: 02/28/2015 Discharge date: 03/12/2015  Time spent: 50 minutes  Recommendations for Outpatient Follow-up:  1. Daily weights- follow for fluid retention or dehydration 2. Bmet in 1 wk  Discharge Condition: stable    Discharge Diagnoses:  Principal Problem:   Acute respiratory failure with hypoxia ([place]) Active Problems:   Atrial fibrillation with rapid ventricular response (HCC)   CAP (community acquired pneumonia)   Influenza with pneumonia   Sepsis due to pneumonia ([place])   Uncontrolled type 2 diabetes mellitus without complication, with long-term current use of insulin ([place])   Elevated troponin I level   Lactic acidosis   Dehydration   [name] (hyperlipidemia)   History of present illness:  70 y.o. female [name], HTN, [name] who presented with 3 weeks of worsening dyspnea on exertion w/ a productive cough and fever. Found to be septic due to influenza A, she was treated with Tamiflu along with Levaquin initially, she also went into A. fib with RVR and seen by cardiology, she is currently in great control however the plan is to do a TEE and attempt at DC cardioversion on 03/10/2015. Patient was transferred under my care on 03/10/2015 on day 10 of her hospital stay.   Hospital Course:  Bibasilar Influenza B PNA w/ acute hypoxic respiratory failure  Has completed a course of both Tamiflu and Levaquin - stable from a respiratory standpoint at this time   New onset Afib w/ RVR with [place] vasc 2 score of greater than 4 Cardiology following - patient is anticoagulated with Xaralto per Cards -Started on on digoxin and metoprolol for rate control -Underwent successful cardioversion on 03/11/2015-digoxin discontinued- cardiology recommending to continue Metoprolol for now- will d/c Atenolol   Elevated troponin Peaked at 0.3 - explained by her RVR - has  been seen by cardiology , this is not consistent with ACS, demand ischemia due to RVR. On aspirin, statin and beta blocker which are continued.  Acute Pulmonary edema due to RVR - ? chronic diastolic CHF - EF 36%  - required lasix during the hospital stay but has not been on it at home - cardiology recommends to continue daily Lasix (new medication) - recommend close follow up of daily weights and monitoring for dehydration or fluid retention- discussed with patient  -cont ACE and B blocker  HTN BP controlled on Lopressor  [name]  -currently on Lipitor 20 mg daily continue  Mildly elevated liver enzymes due to hepatic congestion.  - improved to normal  DM2 Cont Levemir and sliding scale           Procedures: 2/23 TTE - Left ventricle: mild Concentric hypertrophy - EF 50% to 55% - Left atrium: mildly dilated.  3/1- TEE DCCV  Consultations:  Cardiology   PCCM  Discharge Exam: Filed Weights   03/10/15 0500 03/11/15 0400 03/12/15 6440  Weight: 83.87 kg (184 lb 14.4 oz) 80.468 kg (177 lb 6.4 oz) 79.198 kg (174 lb 9.6 oz)   Filed Vitals:   03/12/15 0855 03/12/15 0932  BP:  113/39  Pulse: 68 74  Temp:  98.6 F (37 C)  Resp: 18 18    General: AAO x 3, no distress Cardiovascular: RRR, no murmurs  Respiratory: clear to auscultation bilaterally GI: soft, non-tender, non-distended, bowel sound positive  Discharge Instructions You were cared for by a hospitalist during your hospital stay. If you have any  questions about your discharge medications or the care you received while you were in the hospital after you are discharged, you can call the unit and asked to speak with the hospitalist on call if the hospitalist that took care of you is not available. Once you are discharged, your primary care physician will handle any further medical issues. Please note that NO REFILLS for any discharge medications will be authorized once you are discharged, as it is imperative that you  return to your primary care physician (or establish a relationship with a primary care physician if you do not have one) for your aftercare needs so that they can reassess your need for medications and monitor your lab values.      Discharge Instructions    (HEART FAILURE PATIENTS) Call MD:  Anytime you have any of the following symptoms: 1) 3 pound weight gain in 24 hours or 5 pounds in 1 week 2) shortness of breath, with or without a dry hacking cough 3) swelling in the hands, feet or stomach 4) if you have to sleep on extra pillows at night in order to breathe.    Complete by:  As directed      Diet - low sodium heart healthy    Complete by:  As directed      Increase activity slowly    Complete by:  As directed             Medication List    STOP taking these medications        atenolol 50 MG tablet  Commonly known as:  TENORMIN      TAKE these medications        aspirin EC 81 MG tablet  Take 81 mg by mouth daily.     b complex vitamins tablet  Take 1 tablet by mouth daily.     Biotin 5000 MCG Caps  Take 1 capsule by mouth daily.     CALCIUM GUMMIES PO  Take 500-1,000 mg by mouth daily.     Cinnamon 500 MG Tabs  Take 1,000 mg by mouth daily.     diphenhydrAMINE 25 MG tablet  Commonly known as:  BENADRYL  Take 25 mg by mouth daily.     ESTROVEN + ENERGY MAX STRENGTH Tabs  Take 1 tablet by mouth daily.     Fish Oil 1200 MG Caps  Take 1 capsule by mouth daily.     Garcinia Cambogia-Chromium 500-200 MG-MCG Tabs  Take 1 tablet by mouth daily.     insulin aspart 100 UNIT/ML injection  Commonly known as:  novoLOG  Inject 15 Units into the skin 3 (three) times daily before meals. Sliding scale included as directed per MD     insulin detemir 100 UNIT/ML injection  Commonly known as:  LEVEMIR  Inject 60 Units into the skin at bedtime.     INVOKANA 100 MG Tabs tablet  Generic drug:  canagliflozin  Take 100 mg by mouth daily.     lisinopril 20 MG tablet   Commonly known as:  PRINIVIL,ZESTRIL  Take 20 mg by mouth daily.     metoprolol 50 MG tablet  Commonly known as:  LOPRESSOR  Take 1 tablet (50 mg total) by mouth 2 (two) times daily.     rivaroxaban 20 MG Tabs tablet  Commonly known as:  XARELTO  Take 1 tablet (20 mg total) by mouth daily with supper.     sertraline 100 MG tablet  Commonly known as:  ZOLOFT  Take 100 mg by mouth daily.     simvastatin 40 MG tablet  Commonly known as:  ZOCOR  Take 40 mg by mouth daily.     VICTOZA 18 MG/3ML Sopn  Generic drug:  Liraglutide  Inject 1.2 mg into the skin daily.     VITAMIN C GUMMIE PO  Take 2 each by mouth daily.     Vitamin D 2000 units Caps  Take 1 capsule by mouth daily.     vitamin E 400 UNIT capsule  Generic drug:  vitamin E  Take 400 Units by mouth daily.     XANAX 0.5 MG tablet  Generic drug:  ALPRAZolam  Take 0.5 mg by mouth 3 (three) times daily.       Allergies  Allergen Reactions  . Penicillins Hives    Has patient had a PCN reaction causing immediate rash, facial/tongue/throat swelling, SOB or lightheadedness with hypotension: Yes Has patient had a PCN reaction causing severe rash involving mucus membranes or skin necrosis: No Has patient had a PCN reaction that required hospitalization No Has patient had a PCN reaction occurring within the last 10 years: No If all of the above answers are "NO", then may proceed with Cephalosporin use.       The results of significant diagnostics from this hospitalization (including imaging, microbiology, ancillary and laboratory) are listed below for reference.    Significant Diagnostic Studies: Dg Chest Port 1 View  03/08/2015  CLINICAL DATA:  70 year old female with shortness of breath and cough. EXAM: PORTABLE CHEST 1 VIEW COMPARISON:  03/03/2015 FINDINGS: Cardiomediastinal silhouette is unchanged. Bilateral lower lung opacities again noted, slightly decreased. There is no evidence of pneumothorax. IMPRESSION:  Slightly improved bilateral lower lung opacities which may represent atelectasis, airspace disease and/ or pleural effusion. No other significant changes. Electronically Signed   By: Margarette Canada M.D.   On: 03/08/2015 08:35   Dg Chest Port 1 View  03/03/2015  CLINICAL DATA:  Respiratory failure EXAM: PORTABLE CHEST 1 VIEW COMPARISON:  03/02/2015 FINDINGS: Bibasilar airspace disease, right greater than left with mild progression. No significant pleural effusion. Negative for pulmonary edema. IMPRESSION: Mild progression of bibasilar infiltrates right greater than left. Electronically Signed   By: Franchot Gallo M.D.   On: 03/03/2015 07:18   Dg Chest Port 1 View  03/02/2015  CLINICAL DATA:  Shortness of breath and productive cough EXAM: PORTABLE CHEST 1 VIEW COMPARISON:  08/29/2015 FINDINGS: Cardiac shadow is stable. Patchy infiltrate is again noted in the left mid and lower lung with increasing infiltrate in the right lung base. No bony abnormality is seen. IMPRESSION: Increasing right basilar infiltrate. Patchy changes are again noted on the left. Electronically Signed   By: Inez Catalina M.D.   On: 03/02/2015 16:12   Dg Chest Port 1 View  03/01/2015  CLINICAL DATA:  Dyspnea.  Subsequent encounter. EXAM: PORTABLE CHEST 1 VIEW COMPARISON:  02/28/2015 FINDINGS: Patchy airspace opacity in the left perihilar region and in both lung bases is similar to the prior study allowing for differences in patient positioning and inspiratory volume. No new lung abnormalities. No pneumothorax. IMPRESSION: 1. No significant change from the previous day's study. Bilateral lower lung zone and left perihilar airspace opacities consistent with multifocal pneumonia. Electronically Signed   By: Lajean Manes M.D.   On: 03/01/2015 07:30   Dg Chest Port 1 View  02/28/2015  CLINICAL DATA:  Cough, shortness of breath and myalgias. EXAM: PORTABLE CHEST 1 VIEW COMPARISON:  None. FINDINGS: The  patient is rotated to the left, making it  difficult to assess the heart size. Probable borderline enlargement of the heart. Dense patchy opacity in the left mid lung zone. Patchy opacity at the medial right lung base. Unremarkable bones. IMPRESSION: 1. Pneumonia in the left mid lung zone. Followup PA and lateral chest X-ray is recommended in 3-4 weeks following trial of antibiotic therapy to ensure resolution and exclude underlying malignancy. 2. Right basilar pneumonia or atelectasis. Electronically Signed   By: Claudie Revering M.D.   On: 02/28/2015 15:04    Microbiology: Recent Results (from the past 240 hour(s))  MRSA PCR Screening     Status: None   Collection Time: 03/02/15 12:29 PM  Result Value Ref Range Status   MRSA by PCR NEGATIVE NEGATIVE Final    Comment:        The GeneXpert MRSA Assay (FDA approved for NASAL specimens only), is one component of a comprehensive MRSA colonization surveillance program. It is not intended to diagnose MRSA infection nor to guide or monitor treatment for MRSA infections.   Culture, sputum-assessment     Status: None   Collection Time: 03/05/15  9:31 AM  Result Value Ref Range Status   Specimen Description SPUTUM  Final   Special Requests Normal  Final   Sputum evaluation   Final    THIS SPECIMEN IS ACCEPTABLE. RESPIRATORY CULTURE REPORT TO FOLLOW.   Report Status 03/05/2015 FINAL  Final  Culture, respiratory (NON-Expectorated)     Status: None   Collection Time: 03/05/15  9:31 AM  Result Value Ref Range Status   Specimen Description SPUTUM  Final   Special Requests NONE  Final   Gram Stain   Final    ABUNDANT WBC PRESENT,BOTH PMN AND MONONUCLEAR FEW SQUAMOUS EPITHELIAL CELLS PRESENT FEW GRAM POSITIVE COCCI IN PAIRS Performed at [facility]    Culture   Final    NORMAL OROPHARYNGEAL FLORA Performed at [facility]    Report Status 03/08/2015 FINAL  Final     Labs: Basic Metabolic Panel:  Recent Labs Lab 03/06/15 0400 03/07/15 0259 03/08/15 0446  03/08/15 0844 03/09/15 0300 03/10/15 0708 03/11/15 0425 03/12/15 0449  NA 138 141 141  --  137 142 141 138  K 4.2 4.3 4.2  --  3.8 4.4 4.1 3.7  CL 107 107 105  --  101 102 98* 101  CO2 21* 23 25  --  27 29 32 30  GLUCOSE 300* 280* 171*  --  212* 84 165* 260*  BUN _0 --  _1 CREATININE 0.64 0.83 0.84  --  0.82 0.88 1.14* 1.00  CALCIUM 8.9 9.0 9.0  --  8.6* 9.1 9.2 8.7*  MG 1.7 1.6*  --  1.7 1.5* 1.7 1.8 1.7  PHOS 3.5 4.0  --   --   --   --   --   --    Liver Function Tests:  Recent Labs Lab 03/06/15 0400 03/07/15 0259 03/08/15 0446 03/11/15 0425  AST 78* 97* 49* 26  ALT 88* 116* 89* 46  ALKPHOS 49 56 51 51  BILITOT 0.2* 0.4 0.2* 0.4  PROT 5.8* 6.2* 6.2* 6.4*  ALBUMIN 2.2* 2.5* 2.6* 2.6*   No results for input(s): LIPASE, AMYLASE in the last 168 hours. No results for input(s): AMMONIA in the last 168 hours. CBC:  Recent Labs Lab 03/06/15 0400 03/07/15 0259 03/09/15 2339 03/11/15 0425  WBC 6.6 8.5 8.3 7.9  HGB  10.0* 10.0* 9.9* 11.0*  HCT 29.9* 31.4* 32.0* 36.1  MCV 87.4 86.0 87.9 89.1  PLT 217 270 242 287   Cardiac Enzymes: No results for input(s): CKTOTAL, CKMB, CKMBINDEX, TROPONINI in the last 168 hours. BNP: BNP (last 3 results)  Recent Labs  02/28/15 1418 03/01/15 0351  BNP 375.0* 408.7*    ProBNP (last 3 results) No results for input(s): PROBNP in the last 8760 hours.  CBG:  Recent Labs Lab 03/11/15 0734 03/11/15 1408 03/11/15 1607 03/11/15 2121 03/12/15 0754  GLUCAP 176* 143* 202* 132* 170*       SignedDebbe Odea, MD [facility] 03/12/2015, 11:09 AM

## 2015-03-13 DIAGNOSIS — E119 Type 2 diabetes mellitus without complications: Secondary | ICD-10-CM | POA: Diagnosis not present

## 2015-03-13 DIAGNOSIS — J9601 Acute respiratory failure with hypoxia: Secondary | ICD-10-CM | POA: Diagnosis not present

## 2015-03-13 DIAGNOSIS — A403 Sepsis due to Streptococcus pneumoniae: Secondary | ICD-10-CM | POA: Diagnosis not present

## 2015-03-13 DIAGNOSIS — F329 Major depressive disorder, single episode, unspecified: Secondary | ICD-10-CM | POA: Diagnosis not present

## 2015-03-13 DIAGNOSIS — I4891 Unspecified atrial fibrillation: Secondary | ICD-10-CM | POA: Diagnosis not present

## 2015-03-13 DIAGNOSIS — Z794 Long term (current) use of insulin: Secondary | ICD-10-CM | POA: Diagnosis not present

## 2015-03-13 DIAGNOSIS — I10 Essential (primary) hypertension: Secondary | ICD-10-CM | POA: Diagnosis not present

## 2015-03-13 DIAGNOSIS — J1108 Influenza due to unidentified influenza virus with specified pneumonia: Secondary | ICD-10-CM | POA: Diagnosis not present

## 2015-03-13 DIAGNOSIS — E7800 Pure hypercholesterolemia, unspecified: Secondary | ICD-10-CM | POA: Diagnosis not present

## 2015-03-17 ENCOUNTER — Other Ambulatory Visit: Payer: Self-pay | Admitting: "Endocrinology

## 2015-03-17 DIAGNOSIS — E1165 Type 2 diabetes mellitus with hyperglycemia: Secondary | ICD-10-CM | POA: Diagnosis not present

## 2015-03-17 DIAGNOSIS — J189 Pneumonia, unspecified organism: Secondary | ICD-10-CM | POA: Diagnosis not present

## 2015-03-17 DIAGNOSIS — R5383 Other fatigue: Secondary | ICD-10-CM | POA: Diagnosis not present

## 2015-03-17 DIAGNOSIS — I4891 Unspecified atrial fibrillation: Secondary | ICD-10-CM | POA: Diagnosis not present

## 2015-03-17 DIAGNOSIS — Z794 Long term (current) use of insulin: Secondary | ICD-10-CM | POA: Diagnosis not present

## 2015-03-17 LAB — BASIC METABOLIC PANEL
BUN: 12 mg/dL (ref 7–25)
CALCIUM: 9.4 mg/dL (ref 8.6–10.4)
CO2: 27 mmol/L (ref 20–31)
Chloride: 102 mmol/L (ref 98–110)
Creat: 1.22 mg/dL — ABNORMAL HIGH (ref 0.60–0.93)
GLUCOSE: 105 mg/dL — AB (ref 65–99)
POTASSIUM: 4.2 mmol/L (ref 3.5–5.3)
Sodium: 141 mmol/L (ref 135–146)

## 2015-03-18 LAB — HEMOGLOBIN A1C
HEMOGLOBIN A1C: 7.1 % — AB (ref <5.7)
MEAN PLASMA GLUCOSE: 157 mg/dL — AB (ref <117)

## 2015-03-25 ENCOUNTER — Ambulatory Visit (INDEPENDENT_AMBULATORY_CARE_PROVIDER_SITE_OTHER): Payer: Medicare Other | Admitting: "Endocrinology

## 2015-03-25 ENCOUNTER — Encounter: Payer: Self-pay | Admitting: "Endocrinology

## 2015-03-25 VITALS — BP 112/68 | HR 81 | Ht 63.0 in | Wt 165.0 lb

## 2015-03-25 DIAGNOSIS — E1165 Type 2 diabetes mellitus with hyperglycemia: Secondary | ICD-10-CM

## 2015-03-25 DIAGNOSIS — I10 Essential (primary) hypertension: Secondary | ICD-10-CM

## 2015-03-25 DIAGNOSIS — E785 Hyperlipidemia, unspecified: Secondary | ICD-10-CM

## 2015-03-25 DIAGNOSIS — Z794 Long term (current) use of insulin: Secondary | ICD-10-CM

## 2015-03-25 DIAGNOSIS — IMO0001 Reserved for inherently not codable concepts without codable children: Secondary | ICD-10-CM

## 2015-03-25 NOTE — Progress Notes (Signed)
Subjective:    Patient ID: Ellen Jordan, female    DOB: [birth year], PCP Robert Bellow, MD   Past Medical History  Diagnosis Date  . Hypertension   . Diabetes mellitus without complication ([place])   . Hypercholesteremia   . Cancer [facility])    Past Surgical History  Procedure Laterality Date  . Abdominal hysterectomy    . Back surgery    . Cholecystectomy    . Tee without cardioversion N/A 03/11/2015    Procedure: TRANSESOPHAGEAL ECHOCARDIOGRAM (TEE);  Surgeon: [name], MD;  Location: [place];  Service: Cardiovascular;  Laterality: N/A;  . Cardioversion N/A 03/11/2015    Procedure: CARDIOVERSION;  Surgeon: [name], MD;  Location: [facility] ENDOSCOPY;  Service: Cardiovascular;  Laterality: N/A;   Social History   Social History  . Marital Status: Married    Spouse Name: N/A  . Number of Children: N/A  . Years of Education: N/A   Social History Main Topics  . Smoking status: Former Smoker -- 40 years    Types: Cigarettes    Quit date: 01/10/2005  . Smokeless tobacco: Never Used  . Alcohol Use: No  . Drug Use: No  . Sexual Activity: Not Asked   Other Topics Concern  . None   Social History Narrative   Outpatient Encounter Prescriptions as of 03/25/2015  Medication Sig  . ALPRAZolam (XANAX) 0.5 MG tablet Take 0.5 mg by mouth 3 (three) times daily.   . Ascorbic Acid (VITAMIN C GUMMIE PO) Take 2 each by mouth daily.  Marland Kitchen aspirin EC 81 MG tablet Take 81 mg by mouth daily.  Marland Kitchen b complex vitamins tablet Take 1 tablet by mouth daily.  . Biotin 5000 MCG CAPS Take 1 capsule by mouth daily.  . Calcium-Phosphorus-Vitamin D (CALCIUM GUMMIES PO) Take 500-1,000 mg by mouth daily.  . Cholecalciferol (VITAMIN D) 2000 units CAPS Take 1 capsule by mouth daily.  . Cinnamon 500 MG TABS Take 1,000 mg by mouth daily.   . diphenhydrAMINE (BENADRYL) 25 MG tablet Take 25 mg by mouth daily.  . furosemide (LASIX) 40 MG tablet Take 1 tablet (40 mg total) by mouth daily.  .  Garcinia Cambogia-Chromium 500-200 MG-MCG TABS Take 1 tablet by mouth daily.  . insulin aspart (NOVOLOG) 100 UNIT/ML injection Inject 15 Units into the skin 3 (three) times daily before meals. Sliding scale included as directed per MD  . insulin detemir (LEVEMIR) 100 UNIT/ML injection Inject 60 Units into the skin at bedtime.  . Liraglutide (VICTOZA) 18 MG/3ML SOPN Inject 1.2 mg into the skin daily.   Marland Kitchen lisinopril (PRINIVIL,ZESTRIL) 20 MG tablet Take 20 mg by mouth daily.  . metoprolol tartrate (LOPRESSOR) 50 MG tablet Take 1 tablet (50 mg total) by mouth 2 (two) times daily.  . Misc Natural Products (ESTROVEN + ENERGY MAX STRENGTH) TABS Take 1 tablet by mouth daily.  . Omega-3 Fatty Acids (FISH OIL) 1200 MG CAPS Take 1 capsule by mouth daily.  . rivaroxaban (XARELTO) 20 MG TABS tablet Take 1 tablet (20 mg total) by mouth daily with supper.  . sertraline (ZOLOFT) 100 MG tablet Take 100 mg by mouth daily.  . simvastatin (ZOCOR) 40 MG tablet Take 40 mg by mouth daily.  . vitamin E (VITAMIN E) 400 UNIT capsule Take 400 Units by mouth daily.  . [DISCONTINUED] INVOKANA 100 MG TABS tablet Take 100 mg by mouth daily.   No facility-administered encounter medications on file as of 03/25/2015.   ALLERGIES: Allergies  Allergen Reactions  . Penicillins Hives    Has patient had a PCN reaction causing immediate rash, facial/tongue/throat swelling, SOB or lightheadedness with hypotension: Yes Has patient had a PCN reaction causing severe rash involving mucus membranes or skin necrosis: No Has patient had a PCN reaction that required hospitalization No Has patient had a PCN reaction occurring within the last 10 years: No If all of the above answers are "NO", then may proceed with Cephalosporin use.    VACCINATION STATUS:  There is no immunization history on file for this patient.  Diabetes She presents for her follow-up diabetic visit. She has type 2 diabetes mellitus. Onset time: She was diagnosed at  approximate age of 54 years. Her disease course has been stable. There are no hypoglycemic associated symptoms. Pertinent negatives for hypoglycemia include no confusion, headaches, pallor or seizures. There are no diabetic associated symptoms. Pertinent negatives for diabetes include no chest pain, no polydipsia, no polyphagia and no polyuria. There are no hypoglycemic complications. Symptoms are stable. There are no diabetic complications. Risk factors for coronary artery disease include diabetes mellitus, dyslipidemia, hypertension, obesity, sedentary lifestyle and tobacco exposure. Current diabetic treatment includes intensive insulin program. She is compliant with treatment most of the time. Her weight is decreasing steadily. She is following a generally unhealthy diet. She has not had a previous visit with a dietitian (She declined referral to CDE.). Her home blood glucose trend is decreasing steadily. Her breakfast blood glucose range is generally 140-180 mg/dl. Her lunch blood glucose range is generally 140-180 mg/dl. Her dinner blood glucose range is generally 140-180 mg/dl. Her overall blood glucose range is 140-180 mg/dl. An ACE inhibitor/angiotensin II receptor blocker is being taken. She does not see a podiatrist.Eye exam is not current (She is overdue for eye exam, I advised her to schedule a visit).  Hyperlipidemia This is a chronic problem. The current episode started more than 1 year ago. Exacerbating diseases include diabetes and obesity. Pertinent negatives include no chest pain, leg pain, myalgias or shortness of breath. Current antihyperlipidemic treatment includes statins. Risk factors for coronary artery disease include dyslipidemia, diabetes mellitus, hypertension, a sedentary lifestyle and obesity.  Hypertension This is a chronic problem. The current episode started more than 1 year ago. The problem is controlled. Pertinent negatives include no chest pain, headaches, palpitations or  shortness of breath. Risk factors for coronary artery disease include diabetes mellitus, dyslipidemia, obesity, sedentary lifestyle and smoking/tobacco exposure. Past treatments include ACE inhibitors. The current treatment provides moderate improvement.     Review of Systems  Constitutional: Negative for unexpected weight change.  HENT: Negative for trouble swallowing and voice change.   Eyes: Negative for visual disturbance.  Respiratory: Negative for cough, shortness of breath and wheezing.   Cardiovascular: Negative for chest pain, palpitations and leg swelling.  Gastrointestinal: Negative for nausea, vomiting and diarrhea.  Endocrine: Negative for cold intolerance, heat intolerance, polydipsia, polyphagia and polyuria.  Musculoskeletal: Negative for myalgias and arthralgias.  Skin: Negative for color change, pallor, rash and wound.  Neurological: Negative for seizures and headaches.  Psychiatric/Behavioral: Negative for suicidal ideas and confusion.    Objective:    BP 112/68 mmHg  Pulse 81  Ht _0  (1.6 m)  Wt 165 lb (74.844 kg)  BMI 29.24 kg/m2  SpO2 99%  Wt Readings from Last 3 Encounters:  03/25/15 165 lb (74.844 kg)  03/12/15 174 lb 9.6 oz (79.198 kg)  12/24/14 172 lb (78.019 kg)    Physical Exam  Constitutional: She  is oriented to person, place, and time. She appears well-developed.  HENT:  Head: Normocephalic and atraumatic.  Eyes: EOM are normal.  Neck: Normal range of motion. Neck supple. No tracheal deviation present. No thyromegaly present.  Cardiovascular: Normal rate and regular rhythm.   Pulmonary/Chest: Effort normal and breath sounds normal.  Abdominal: Soft. Bowel sounds are normal. There is no tenderness. There is no guarding.  Musculoskeletal: Normal range of motion. She exhibits no edema.  Neurological: She is alert and oriented to person, place, and time. She has normal reflexes. No cranial nerve deficit. Coordination normal.  Skin: Skin is warm  and dry. No rash noted. No erythema. No pallor.  Psychiatric: She has a normal mood and affect. Judgment normal.    Results for orders placed or performed in visit on 68/37/29  Basic metabolic panel  Result Value Ref Range   Sodium 141 135 - 146 mmol/L   Potassium 4.2 3.5 - 5.3 mmol/L   Chloride 102 98 - 110 mmol/L   CO2 27 20 - 31 mmol/L   Glucose, Bld 105 (H) 65 - 99 mg/dL   BUN 12 7 - 25 mg/dL   Creat 1.22 (H) 0.60 - 0.93 mg/dL   Calcium 9.4 8.6 - 10.4 mg/dL  Hemoglobin A1c  Result Value Ref Range   Hgb A1c MFr Bld 7.1 (H) <5.7 %   Mean Plasma Glucose 157 (H) <117 mg/dL     Assessment & Plan:   1. Uncontrolled type 2 diabetes mellitus without complication, with long-term current use of insulin   - She remains at a high risk for more acute and chronic complications of diabetes which include CAD, CVA, CKD, retinopathy, and neuropathy. These are all discussed in detail with the patient.  Patient came with consistently near target glucose profile, and  recent A1c  7.1% increasing from  6.5% , overall improving from 14.6 %.  Glucose logs and insulin administration records pertaining to this visit,  to be scanned into patient's records.  Recent labs reviewed.   - I have re-counseled the patient on diet management  by adopting a carbohydrate restricted / protein rich  Diet.  - Suggestion is made for patient to avoid simple carbohydrates   from their diet including Cakes , Desserts, Ice Cream,  Soda (  diet and regular) , Sweet Tea , Candies,  Chips, Cookies, Artificial Sweeteners,   and "Sugar-free" Products .  This will help patient to have stable blood glucose profile and potentially avoid unintended  Weight gain.  - Patient is advised to stick to a routine mealtimes to eat 3 meals  a day and avoid unnecessary snacks ( to snack only to correct hypoglycemia).  - The patient  declined a referral to a  CDE for individualized DM education.  - I have approached patient with the  following individualized plan to manage diabetes and patient agrees.  - Continue Levemir 60 units qhs, and Novolog 15 units TIDAC , plus correction dose associated with monitoring of blood glucose before meals and at bedtime.  -Continue Victoza at 1.8 mg sq q day.  -I will discontinue  Invokana, has recent increase in serum creatinine.  - Patient specific target  for A1c; LDL, HDL, Triglycerides, and  Waist Circumference were discussed in detail.  2) BP/HTN: controlled . Continue current medications including ACEI/ARB. 3) Lipids/HPL:  continue statins. 4)  Weight/Diet:  declined CDE consult, exercise, and carbohydrates information provided.  5) Chronic Care/Health Maintenance:  -Patient is on ACEI/ARB and Statin  medications and encouraged to continue to follow up with Ophthalmology, Podiatrist at least yearly or according to recommendations, and advised to  stay away from smoking. I have recommended yearly flu vaccine and pneumonia vaccination at least every 5 years; moderate intensity exercise for up to 150 minutes weekly; and  sleep for at least 7 hours a day.  - 25 minutes of time was spent on the care of this patient , 50% of which was applied for counseling on diabetes complications and their preventions.  - I advised patient to maintain close follow up with Robert Bellow, MD for primary care needs.  Patient is asked to bring meter and  blood glucose logs during their next visit.   Follow up plan: -Return in about 3 months (around 06/25/2015) for diabetes, high blood pressure, high cholesterol, follow up with pre-visit labs, meter, and logs.  Glade Lloyd, MD Phone: [phone]  Fax: [phone]   03/25/2015, 10:17 AM

## 2015-03-25 NOTE — Patient Instructions (Signed)

## 2015-06-23 ENCOUNTER — Other Ambulatory Visit: Payer: Self-pay | Admitting: "Endocrinology

## 2015-06-23 LAB — BASIC METABOLIC PANEL
BUN: 13 mg/dL (ref 7–25)
CALCIUM: 9.3 mg/dL (ref 8.6–10.4)
CHLORIDE: 107 mmol/L (ref 98–110)
CO2: 25 mmol/L (ref 20–31)
Creat: 0.9 mg/dL (ref 0.60–0.93)
Glucose, Bld: 154 mg/dL — ABNORMAL HIGH (ref 65–99)
Potassium: 4 mmol/L (ref 3.5–5.3)
Sodium: 142 mmol/L (ref 135–146)

## 2015-06-23 LAB — HEMOGLOBIN A1C
HEMOGLOBIN A1C: 6.1 % — AB (ref <5.7)
MEAN PLASMA GLUCOSE: 128 mg/dL

## 2015-07-01 ENCOUNTER — Encounter: Payer: Self-pay | Admitting: "Endocrinology

## 2015-07-01 ENCOUNTER — Ambulatory Visit (INDEPENDENT_AMBULATORY_CARE_PROVIDER_SITE_OTHER): Payer: Medicare Other | Admitting: "Endocrinology

## 2015-07-01 VITALS — BP 121/65 | HR 99 | Ht 63.0 in | Wt 169.0 lb

## 2015-07-01 DIAGNOSIS — I10 Essential (primary) hypertension: Secondary | ICD-10-CM

## 2015-07-01 DIAGNOSIS — E785 Hyperlipidemia, unspecified: Secondary | ICD-10-CM | POA: Diagnosis not present

## 2015-07-01 DIAGNOSIS — IMO0001 Reserved for inherently not codable concepts without codable children: Secondary | ICD-10-CM

## 2015-07-01 DIAGNOSIS — E1165 Type 2 diabetes mellitus with hyperglycemia: Secondary | ICD-10-CM

## 2015-07-01 DIAGNOSIS — Z794 Long term (current) use of insulin: Secondary | ICD-10-CM | POA: Diagnosis not present

## 2015-07-01 NOTE — Progress Notes (Signed)
Subjective:    Patient ID: Ellen Jordan, female    DOB: 04-30-1945, PCP Robert Bellow, MD   Past Medical History  Diagnosis Date  . Hypertension   . Diabetes mellitus without complication ([place])   . Hypercholesteremia   . Cancer [facility])    Past Surgical History  Procedure Laterality Date  . Abdominal hysterectomy    . Back surgery    . Cholecystectomy    . Tee without cardioversion N/A 03/11/2015    Procedure: TRANSESOPHAGEAL ECHOCARDIOGRAM (TEE);  Surgeon: [name], MD;  Location: [place];  Service: Cardiovascular;  Laterality: N/A;  . Cardioversion N/A 03/11/2015    Procedure: CARDIOVERSION;  Surgeon: [name], MD;  Location: [facility] ENDOSCOPY;  Service: Cardiovascular;  Laterality: N/A;   Social History   Social History  . Marital Status: Married    Spouse Name: N/A  . Number of Children: N/A  . Years of Education: N/A   Social History Main Topics  . Smoking status: Former Smoker -- 40 years    Types: Cigarettes    Quit date: 01/10/2005  . Smokeless tobacco: Never Used  . Alcohol Use: No  . Drug Use: No  . Sexual Activity: Not Asked   Other Topics Concern  . None   Social History Narrative   Outpatient Encounter Prescriptions as of 07/01/2015  Medication Sig  . Liraglutide (VICTOZA) 18 MG/3ML SOPN Inject 1.8 mg into the skin daily.  Marland Kitchen ALPRAZolam (XANAX) 0.5 MG tablet Take 0.5 mg by mouth 3 (three) times daily.   . Ascorbic Acid (VITAMIN C GUMMIE PO) Take 2 each by mouth daily.  Marland Kitchen aspirin EC 81 MG tablet Take 81 mg by mouth daily.  Marland Kitchen b complex vitamins tablet Take 1 tablet by mouth daily.  . Biotin 5000 MCG CAPS Take 1 capsule by mouth daily.  . Calcium-Phosphorus-Vitamin D (CALCIUM GUMMIES PO) Take 500-1,000 mg by mouth daily.  . Cholecalciferol (VITAMIN D) 2000 units CAPS Take 1 capsule by mouth daily.  . Cinnamon 500 MG TABS Take 1,000 mg by mouth daily.   . diphenhydrAMINE (BENADRYL) 25 MG tablet Take 25 mg by mouth daily.  .  furosemide (LASIX) 40 MG tablet Take 1 tablet (40 mg total) by mouth daily.  . Garcinia Cambogia-Chromium 500-200 MG-MCG TABS Take 1 tablet by mouth daily.  . insulin aspart (NOVOLOG) 100 UNIT/ML injection Inject 10-16 Units into the skin 3 (three) times daily with meals.  . insulin detemir (LEVEMIR) 100 UNIT/ML injection Inject 60 Units into the skin at bedtime.  Marland Kitchen lisinopril (PRINIVIL,ZESTRIL) 20 MG tablet Take 20 mg by mouth daily.  . metoprolol tartrate (LOPRESSOR) 50 MG tablet Take 1 tablet (50 mg total) by mouth 2 (two) times daily.  . Misc Natural Products (ESTROVEN + ENERGY MAX STRENGTH) TABS Take 1 tablet by mouth daily.  . Omega-3 Fatty Acids (FISH OIL) 1200 MG CAPS Take 1 capsule by mouth daily.  . rivaroxaban (XARELTO) 20 MG TABS tablet Take 1 tablet (20 mg total) by mouth daily with supper.  . sertraline (ZOLOFT) 100 MG tablet Take 100 mg by mouth daily.  . simvastatin (ZOCOR) 40 MG tablet Take 40 mg by mouth daily.  . vitamin E (VITAMIN E) 400 UNIT capsule Take 400 Units by mouth daily.   No facility-administered encounter medications on file as of 07/01/2015.   ALLERGIES: Allergies  Allergen Reactions  . Penicillins Hives    Has patient had a PCN reaction causing immediate rash, facial/tongue/throat swelling, SOB  or lightheadedness with hypotension: Yes Has patient had a PCN reaction causing severe rash involving mucus membranes or skin necrosis: No Has patient had a PCN reaction that required hospitalization No Has patient had a PCN reaction occurring within the last 10 years: No If all of the above answers are "NO", then may proceed with Cephalosporin use.    VACCINATION STATUS:  There is no immunization history on file for this patient.  Diabetes She presents for her follow-up diabetic visit. She has type 2 diabetes mellitus. Onset time: She was diagnosed at approximate age of 7 years. Her disease course has been improving. There are no hypoglycemic associated  symptoms. Pertinent negatives for hypoglycemia include no confusion, headaches, pallor or seizures. There are no diabetic associated symptoms. Pertinent negatives for diabetes include no chest pain, no polydipsia, no polyphagia and no polyuria. There are no hypoglycemic complications. Symptoms are improving. There are no diabetic complications. Risk factors for coronary artery disease include diabetes mellitus, dyslipidemia, hypertension, obesity, sedentary lifestyle and tobacco exposure. Current diabetic treatment includes intensive insulin program. She is compliant with treatment most of the time. Her weight is decreasing steadily. She is following a generally unhealthy diet. She has not had a previous visit with a dietitian (She declined referral to CDE.). Her home blood glucose trend is decreasing steadily. Her breakfast blood glucose range is generally 140-180 mg/dl. Her lunch blood glucose range is generally 140-180 mg/dl. Her dinner blood glucose range is generally 140-180 mg/dl. Her overall blood glucose range is 140-180 mg/dl. An ACE inhibitor/angiotensin II receptor blocker is being taken. She does not see a podiatrist.Eye exam is not current (She is overdue for eye exam, I advised her to schedule a visit).  Hyperlipidemia This is a chronic problem. The current episode started more than 1 year ago. Exacerbating diseases include diabetes and obesity. Pertinent negatives include no chest pain, leg pain, myalgias or shortness of breath. Current antihyperlipidemic treatment includes statins. Risk factors for coronary artery disease include dyslipidemia, diabetes mellitus, hypertension, a sedentary lifestyle and obesity.  Hypertension This is a chronic problem. The current episode started more than 1 year ago. The problem is controlled. Pertinent negatives include no chest pain, headaches, palpitations or shortness of breath. Risk factors for coronary artery disease include diabetes mellitus, dyslipidemia,  obesity, sedentary lifestyle and smoking/tobacco exposure. Past treatments include ACE inhibitors. The current treatment provides moderate improvement.     Review of Systems  Constitutional: Negative for unexpected weight change.  HENT: Negative for trouble swallowing and voice change.   Eyes: Negative for visual disturbance.  Respiratory: Negative for cough, shortness of breath and wheezing.   Cardiovascular: Negative for chest pain, palpitations and leg swelling.  Gastrointestinal: Negative for nausea, vomiting and diarrhea.  Endocrine: Negative for cold intolerance, heat intolerance, polydipsia, polyphagia and polyuria.  Musculoskeletal: Negative for myalgias and arthralgias.  Skin: Negative for color change, pallor, rash and wound.  Neurological: Negative for seizures and headaches.  Psychiatric/Behavioral: Negative for suicidal ideas and confusion.    Objective:    BP 121/65 mmHg  Pulse 99  Ht _0  (1.6 m)  Wt 169 lb (76.658 kg)  BMI 29.94 kg/m2  Wt Readings from Last 3 Encounters:  07/01/15 169 lb (76.658 kg)  03/25/15 165 lb (74.844 kg)  03/12/15 174 lb 9.6 oz (79.198 kg)    Physical Exam  Constitutional: She is oriented to person, place, and time. She appears well-developed.  HENT:  Head: Normocephalic and atraumatic.  Eyes: EOM are normal.  Neck:  Normal range of motion. Neck supple. No tracheal deviation present. No thyromegaly present.  Cardiovascular: Normal rate and regular rhythm.   Pulmonary/Chest: Effort normal and breath sounds normal.  Abdominal: Soft. Bowel sounds are normal. There is no tenderness. There is no guarding.  Musculoskeletal: Normal range of motion. She exhibits no edema.  Neurological: She is alert and oriented to person, place, and time. She has normal reflexes. No cranial nerve deficit. Coordination normal.  Skin: Skin is warm and dry. No rash noted. No erythema. No pallor.  Psychiatric: She has a normal mood and affect. Judgment normal.     Results for orders placed or performed in visit on 46/65/99  Basic metabolic panel  Result Value Ref Range   Sodium 142 135 - 146 mmol/L   Potassium 4.0 3.5 - 5.3 mmol/L   Chloride 107 98 - 110 mmol/L   CO2 25 20 - 31 mmol/L   Glucose, Bld 154 (H) 65 - 99 mg/dL   BUN 13 7 - 25 mg/dL   Creat 0.90 0.60 - 0.93 mg/dL   Calcium 9.3 8.6 - 10.4 mg/dL  Hemoglobin A1c  Result Value Ref Range   Hgb A1c MFr Bld 6.1 (H) <5.7 %   Mean Plasma Glucose 128 mg/dL     Assessment & Plan:   1. Uncontrolled type 2 diabetes mellitus without complication, with long-term current use of insulin   - She remains at a high risk for more acute and chronic complications of diabetes which include CAD, CVA, CKD, retinopathy, and neuropathy. These are all discussed in detail with the patient.  Patient came with consistently near target glucose profile, and  recent A1c  6.1% , overall improving from 14.6 %.  Glucose logs and insulin administration records pertaining to this visit,  to be scanned into patient's records.  Recent labs reviewed.   - I have re-counseled the patient on diet management  by adopting a carbohydrate restricted / protein rich  Diet.  - Suggestion is made for patient to avoid simple carbohydrates   from their diet including Cakes , Desserts, Ice Cream,  Soda (  diet and regular) , Sweet Tea , Candies,  Chips, Cookies, Artificial Sweeteners,   and "Sugar-free" Products .  This will help patient to have stable blood glucose profile and potentially avoid unintended  Weight gain.  - Patient is advised to stick to a routine mealtimes to eat 3 meals  a day and avoid unnecessary snacks ( to snack only to correct hypoglycemia).  - The patient  declined a referral to a  CDE for individualized DM education.  - I have approached patient with the following individualized plan to manage diabetes and patient agrees.  - Continue Levemir 60 units qhs, and decrease Novolog to 10 units TIDAC , plus  correction dose associated with monitoring of blood glucose before meals and at bedtime.  -Continue Victoza at 1.8 mg sq q day.  -I will discontinue  Invokana, has recent increase in serum creatinine.  - Patient specific target  for A1c; LDL, HDL, Triglycerides, and  Waist Circumference were discussed in detail.  2) BP/HTN: controlled . Continue current medications including ACEI/ARB. 3) Lipids/HPL:  continue statins. 4)  Weight/Diet:  declined CDE consult, exercise, and carbohydrates information provided.  5) Chronic Care/Health Maintenance:  -Patient is on ACEI/ARB and Statin medications and encouraged to continue to follow up with Ophthalmology, Podiatrist at least yearly or according to recommendations, and advised to  stay away from smoking. I have recommended  yearly flu vaccine and pneumonia vaccination at least every 5 years; moderate intensity exercise for up to 150 minutes weekly; and  sleep for at least 7 hours a day.  - 25 minutes of time was spent on the care of this patient , 50% of which was applied for counseling on diabetes complications and their preventions.  - I advised patient to maintain close follow up with Robert Bellow, MD for primary care needs.  Patient is asked to bring meter and  blood glucose logs during their next visit.   Follow up plan: -Return in about 3 months (around 10/01/2015) for follow up with pre-visit labs, meter, and logs.  Glade Lloyd, MD Phone: [phone]  Fax: [phone]   07/01/2015, 11:03 AM

## 2015-07-01 NOTE — Patient Instructions (Signed)

## 2015-09-08 ENCOUNTER — Other Ambulatory Visit: Payer: Self-pay | Admitting: "Endocrinology

## 2015-09-18 ENCOUNTER — Other Ambulatory Visit: Payer: Self-pay | Admitting: "Endocrinology

## 2015-09-19 LAB — LIPID PANEL
Cholesterol: 147 mg/dL (ref 125–200)
HDL: 27 mg/dL — AB (ref >=46)
LDL Cholesterol: 73 mg/dL (ref <130)
TRIGLYCERIDES: 236 mg/dL — AB (ref <150)
Total CHOL/HDL Ratio: 5.4 Ratio — ABNORMAL HIGH (ref <=5.0)
VLDL: 47 mg/dL — ABNORMAL HIGH (ref <30)

## 2015-09-19 LAB — COMPLETE METABOLIC PANEL WITH GFR
ALT: 23 U/L (ref 6–29)
AST: 25 U/L (ref 10–35)
Albumin: 4.2 g/dL (ref 3.6–5.1)
Alkaline Phosphatase: 53 U/L (ref 33–130)
BUN: 14 mg/dL (ref 7–25)
CHLORIDE: 105 mmol/L (ref 98–110)
CO2: 25 mmol/L (ref 20–31)
Calcium: 9.3 mg/dL (ref 8.6–10.4)
Creat: 0.93 mg/dL (ref 0.60–0.93)
GFR, EST AFRICAN AMERICAN: 72 mL/min (ref >=60)
GFR, EST NON AFRICAN AMERICAN: 62 mL/min (ref >=60)
Glucose, Bld: 100 mg/dL — ABNORMAL HIGH (ref 65–99)
Potassium: 4 mmol/L (ref 3.5–5.3)
Sodium: 142 mmol/L (ref 135–146)
Total Bilirubin: 0.5 mg/dL (ref 0.2–1.2)
Total Protein: 7.1 g/dL (ref 6.1–8.1)

## 2015-09-19 LAB — TSH: TSH: 1.96 mIU/L

## 2015-09-19 LAB — HEMOGLOBIN A1C
Hgb A1c MFr Bld: 6.8 % — ABNORMAL HIGH (ref <5.7)
MEAN PLASMA GLUCOSE: 148 mg/dL

## 2015-09-19 LAB — MICROALBUMIN / CREATININE URINE RATIO
Creatinine, Urine: 159 mg/dL (ref 20–320)
MICROALB UR: 0.8 mg/dL
MICROALB/CREAT RATIO: 5 ug/mg{creat} (ref <30)

## 2015-09-19 LAB — T4, FREE: Free T4: 1.1 ng/dL (ref 0.8–1.8)

## 2015-09-30 ENCOUNTER — Encounter: Payer: Self-pay | Admitting: "Endocrinology

## 2015-09-30 ENCOUNTER — Ambulatory Visit (INDEPENDENT_AMBULATORY_CARE_PROVIDER_SITE_OTHER): Payer: Commercial Managed Care - HMO | Admitting: "Endocrinology

## 2015-09-30 VITALS — BP 125/73 | HR 86 | Ht 63.0 in | Wt 170.0 lb

## 2015-09-30 DIAGNOSIS — E785 Hyperlipidemia, unspecified: Secondary | ICD-10-CM | POA: Diagnosis not present

## 2015-09-30 DIAGNOSIS — E1165 Type 2 diabetes mellitus with hyperglycemia: Secondary | ICD-10-CM

## 2015-09-30 DIAGNOSIS — I10 Essential (primary) hypertension: Secondary | ICD-10-CM

## 2015-09-30 DIAGNOSIS — Z794 Long term (current) use of insulin: Secondary | ICD-10-CM

## 2015-09-30 DIAGNOSIS — IMO0001 Reserved for inherently not codable concepts without codable children: Secondary | ICD-10-CM

## 2015-09-30 NOTE — Patient Instructions (Signed)

## 2015-09-30 NOTE — Progress Notes (Signed)
Subjective:    Patient ID: Ellen Jordan, female    DOB: Jul 02, 1945, PCP Robert Bellow, MD   Past Medical History:  Diagnosis Date  . Cancer ([place])   . Diabetes mellitus without complication ([place])   . Hypercholesteremia   . Hypertension    Past Surgical History:  Procedure Laterality Date  . ABDOMINAL HYSTERECTOMY    . BACK SURGERY    . CARDIOVERSION N/A 03/11/2015   Procedure: CARDIOVERSION;  Surgeon: [name], MD;  Location: [place];  Service: Cardiovascular;  Laterality: N/A;  . CHOLECYSTECTOMY    . TEE WITHOUT CARDIOVERSION N/A 03/11/2015   Procedure: TRANSESOPHAGEAL ECHOCARDIOGRAM (TEE);  Surgeon: [name], MD;  Location: [facility] ENDOSCOPY;  Service: Cardiovascular;  Laterality: N/A;   Social History   Social History  . Marital status: Married    Spouse name: N/A  . Number of children: N/A  . Years of education: N/A   Social History Main Topics  . Smoking status: Former Smoker    Years: 40.00    Types: Cigarettes    Quit date: 01/10/2005  . Smokeless tobacco: Never Used  . Alcohol use No  . Drug use: No  . Sexual activity: Not Asked   Other Topics Concern  . None   Social History Narrative  . None   Outpatient Encounter Prescriptions as of 09/30/2015  Medication Sig  . ALPRAZolam (XANAX) 0.5 MG tablet Take 0.5 mg by mouth 3 (three) times daily.   . Ascorbic Acid (VITAMIN C GUMMIE PO) Take 2 each by mouth daily.  Marland Kitchen aspirin EC 81 MG tablet Take 81 mg by mouth daily.  Marland Kitchen b complex vitamins tablet Take 1 tablet by mouth daily.  . Biotin 5000 MCG CAPS Take 1 capsule by mouth daily.  . Calcium-Phosphorus-Vitamin D (CALCIUM GUMMIES PO) Take 500-1,000 mg by mouth daily.  . Cholecalciferol (VITAMIN D) 2000 units CAPS Take 1 capsule by mouth daily.  . Cinnamon 500 MG TABS Take 1,000 mg by mouth daily.   . diphenhydrAMINE (BENADRYL) 25 MG tablet Take 25 mg by mouth daily.  . furosemide (LASIX) 40 MG tablet Take 1 tablet (40 mg total) by mouth  daily.  . Garcinia Cambogia-Chromium 500-200 MG-MCG TABS Take 1 tablet by mouth daily.  . GNP ALCOHOL SWABS 70 % PADS USE FOUR TIMES DAILY  . insulin aspart (NOVOLOG) 100 UNIT/ML injection Inject 10-16 Units into the skin 3 (three) times daily with meals.  . insulin detemir (LEVEMIR) 100 UNIT/ML injection Inject 60 Units into the skin at bedtime.  . Liraglutide (VICTOZA) 18 MG/3ML SOPN Inject 1.8 mg into the skin daily.  Marland Kitchen lisinopril (PRINIVIL,ZESTRIL) 20 MG tablet Take 20 mg by mouth daily.  . metoprolol tartrate (LOPRESSOR) 50 MG tablet Take 1 tablet (50 mg total) by mouth 2 (two) times daily.  . Misc Natural Products (ESTROVEN + ENERGY MAX STRENGTH) TABS Take 1 tablet by mouth daily.  . Omega-3 Fatty Acids (FISH OIL) 1200 MG CAPS Take 1 capsule by mouth daily.  . rivaroxaban (XARELTO) 20 MG TABS tablet Take 1 tablet (20 mg total) by mouth daily with supper.  . sertraline (ZOLOFT) 100 MG tablet Take 100 mg by mouth daily.  . simvastatin (ZOCOR) 40 MG tablet Take 40 mg by mouth daily.  . vitamin E (VITAMIN E) 400 UNIT capsule Take 400 Units by mouth daily.   No facility-administered encounter medications on file as of 09/30/2015.    ALLERGIES: Allergies  Allergen Reactions  . Penicillins  Hives    Has patient had a PCN reaction causing immediate rash, facial/tongue/throat swelling, SOB or lightheadedness with hypotension: Yes Has patient had a PCN reaction causing severe rash involving mucus membranes or skin necrosis: No Has patient had a PCN reaction that required hospitalization No Has patient had a PCN reaction occurring within the last 10 years: No If all of the above answers are "NO", then may proceed with Cephalosporin use.    VACCINATION STATUS:  There is no immunization history on file for this patient.  Diabetes  She presents for her follow-up diabetic visit. She has type 2 diabetes mellitus. Onset time: She was diagnosed at approximate age of 62 years. Her disease course  has been improving. There are no hypoglycemic associated symptoms. Pertinent negatives for hypoglycemia include no confusion, headaches, pallor or seizures. There are no diabetic associated symptoms. Pertinent negatives for diabetes include no chest pain, no polydipsia, no polyphagia and no polyuria. There are no hypoglycemic complications. Symptoms are improving. There are no diabetic complications. Risk factors for coronary artery disease include diabetes mellitus, dyslipidemia, hypertension, obesity, sedentary lifestyle and tobacco exposure. Current diabetic treatment includes intensive insulin program. She is compliant with treatment most of the time. Her weight is decreasing steadily. She is following a generally unhealthy diet. She has not had a previous visit with a dietitian (She declined referral to CDE.). Her home blood glucose trend is decreasing steadily. Her breakfast blood glucose range is generally 140-180 mg/dl. Her lunch blood glucose range is generally 140-180 mg/dl. Her dinner blood glucose range is generally 140-180 mg/dl. Her overall blood glucose range is 140-180 mg/dl. An ACE inhibitor/angiotensin II receptor blocker is being taken. She does not see a podiatrist.Eye exam is not current (She is overdue for eye exam, I advised her to schedule a visit).  Hyperlipidemia  This is a chronic problem. The current episode started more than 1 year ago. Exacerbating diseases include diabetes and obesity. Pertinent negatives include no chest pain, leg pain, myalgias or shortness of breath. Current antihyperlipidemic treatment includes statins. Risk factors for coronary artery disease include dyslipidemia, diabetes mellitus, hypertension, a sedentary lifestyle and obesity.  Hypertension  This is a chronic problem. The current episode started more than 1 year ago. The problem is controlled. Pertinent negatives include no chest pain, headaches, palpitations or shortness of breath. Risk factors for  coronary artery disease include diabetes mellitus, dyslipidemia, obesity, sedentary lifestyle and smoking/tobacco exposure. Past treatments include ACE inhibitors. The current treatment provides moderate improvement.     Review of Systems  Constitutional: Negative for unexpected weight change.  HENT: Negative for trouble swallowing and voice change.   Eyes: Negative for visual disturbance.  Respiratory: Negative for cough, shortness of breath and wheezing.   Cardiovascular: Negative for chest pain, palpitations and leg swelling.  Gastrointestinal: Negative for diarrhea, nausea and vomiting.  Endocrine: Negative for cold intolerance, heat intolerance, polydipsia, polyphagia and polyuria.  Musculoskeletal: Negative for arthralgias and myalgias.  Skin: Negative for color change, pallor, rash and wound.  Neurological: Negative for seizures and headaches.  Psychiatric/Behavioral: Negative for confusion and suicidal ideas.    Objective:    BP 125/73   Pulse 86   Ht _0  (1.6 m)   Wt 170 lb (77.1 kg)   BMI 30.11 kg/m   Wt Readings from Last 3 Encounters:  09/30/15 170 lb (77.1 kg)  07/01/15 169 lb (76.7 kg)  03/25/15 165 lb (74.8 kg)    Physical Exam  Constitutional: She is oriented to  person, place, and time. She appears well-developed.  HENT:  Head: Normocephalic and atraumatic.  Eyes: EOM are normal.  Neck: Normal range of motion. Neck supple. No tracheal deviation present. No thyromegaly present.  Cardiovascular: Normal rate and regular rhythm.   Pulmonary/Chest: Effort normal and breath sounds normal.  Abdominal: Soft. Bowel sounds are normal. There is no tenderness. There is no guarding.  Musculoskeletal: Normal range of motion. She exhibits no edema.  Neurological: She is alert and oriented to person, place, and time. She has normal reflexes. No cranial nerve deficit. Coordination normal.  Skin: Skin is warm and dry. No rash noted. No erythema. No pallor.  Psychiatric: She  has a normal mood and affect. Judgment normal.    Results for orders placed or performed in visit on 09/18/15  COMPLETE METABOLIC PANEL WITH GFR  Result Value Ref Range   Sodium 142 135 - 146 mmol/L   Potassium 4.0 3.5 - 5.3 mmol/L   Chloride 105 98 - 110 mmol/L   CO2 25 20 - 31 mmol/L   Glucose, Bld 100 (H) 65 - 99 mg/dL   BUN 14 7 - 25 mg/dL   Creat 0.93 0.60 - 0.93 mg/dL   Total Bilirubin 0.5 0.2 - 1.2 mg/dL   Alkaline Phosphatase 53 33 - 130 U/L   AST 25 10 - 35 U/L   ALT 23 6 - 29 U/L   Total Protein 7.1 6.1 - 8.1 g/dL   Albumin 4.2 3.6 - 5.1 g/dL   Calcium 9.3 8.6 - 10.4 mg/dL   GFR, Est African American 72 >=60 mL/min   GFR, Est Non African American 62 >=60 mL/min  Microalbumin / creatinine urine ratio  Result Value Ref Range   Creatinine, Urine 159 20 - 320 mg/dL   Microalb, Ur 0.8 Not estab mg/dL   Microalb Creat Ratio 5 <30 mcg/mg creat  Lipid panel  Result Value Ref Range   Cholesterol 147 125 - 200 mg/dL   Triglycerides 236 (H) <150 mg/dL   HDL 27 (L) >=46 mg/dL   Total CHOL/HDL Ratio 5.4 (H) <=5.0 Ratio   VLDL 47 (H) <30 mg/dL   LDL Cholesterol 73 <130 mg/dL  TSH  Result Value Ref Range   TSH 1.96 mIU/L  T4, free  Result Value Ref Range   Free T4 1.1 0.8 - 1.8 ng/dL  Hemoglobin A1c  Result Value Ref Range   Hgb A1c MFr Bld 6.8 (H) <5.7 %   Mean Plasma Glucose 148 mg/dL     Assessment & Plan:   1. Uncontrolled type 2 diabetes mellitus without complication, with long-term current use of insulin   - She remains at a high risk for more acute and chronic complications of diabetes which include CAD, CVA, CKD, retinopathy, and neuropathy. These are all discussed in detail with the patient.  Patient came with consistently near target glucose profile, and  recent A1c   Remains stable at 6.8  , overall improving from 14.6 %.  Glucose logs and insulin administration records pertaining to this visit,  to be scanned into patient's records.  Recent labs  reviewed.   - I have re-counseled the patient on diet management  by adopting a carbohydrate restricted / protein rich  Diet.  - Suggestion is made for patient to avoid simple carbohydrates   from their diet including Cakes , Desserts, Ice Cream,  Soda (  diet and regular) , Sweet Tea , Candies,  Chips, Cookies, Artificial Sweeteners,   and "Sugar-free" Products .  This will help patient to have stable blood glucose profile and potentially avoid unintended  Weight gain.  - Patient is advised to stick to a routine mealtimes to eat 3 meals  a day and avoid unnecessary snacks ( to snack only to correct hypoglycemia).  - The patient  declined a referral to a  CDE for individualized DM education.  - I have approached patient with the following individualized plan to manage diabetes and patient agrees.  - Continue Levemir 60 units qhs, and Novolog  10 units TIDAC , plus correction dose associated with monitoring of blood glucose before meals and at bedtime.  -Continue Victoza at 1.8 mg sq q day.  -I will discontinue  Invokana, has recent increase in serum creatinine. Renal function has improved.  - Patient specific target  for A1c; LDL, HDL, Triglycerides, and  Waist Circumference were discussed in detail.  2) BP/HTN: controlled . Continue current medications including ACEI/ARB. 3) Lipids/HPL:  continue statins. 4)  Weight/Diet:  declined CDE consult, exercise, and carbohydrates information provided.  5) Chronic Care/Health Maintenance:  -Patient is on ACEI/ARB and Statin medications and encouraged to continue to follow up with Ophthalmology, Podiatrist at least yearly or according to recommendations, and advised to  stay away from smoking. I have recommended yearly flu vaccine and pneumonia vaccination at least every 5 years; moderate intensity exercise for up to 150 minutes weekly; and  sleep for at least 7 hours a day.  - 25 minutes of time was spent on the care of this patient , 50% of which  was applied for counseling on diabetes complications and their preventions.  - I advised patient to maintain close follow up with Robert Bellow, MD for primary care needs.  Patient is asked to bring meter and  blood glucose logs during their next visit.   Follow up plan: -Return in about 3 months (around 12/30/2015) for follow up with pre-visit labs, meter, and logs.  Glade Lloyd, MD Phone: [phone]  Fax: [phone]   09/30/2015, 11:57 AM

## 2015-12-30 ENCOUNTER — Other Ambulatory Visit: Payer: Self-pay | Admitting: "Endocrinology

## 2015-12-30 LAB — COMPREHENSIVE METABOLIC PANEL
ALK PHOS: 52 U/L (ref 33–130)
ALT: 29 U/L (ref 6–29)
AST: 36 U/L — ABNORMAL HIGH (ref 10–35)
Albumin: 4 g/dL (ref 3.6–5.1)
BUN: 16 mg/dL (ref 7–25)
CO2: 27 mmol/L (ref 20–31)
CREATININE: 0.89 mg/dL (ref 0.60–0.93)
Calcium: 9.1 mg/dL (ref 8.6–10.4)
Chloride: 106 mmol/L (ref 98–110)
Glucose, Bld: 59 mg/dL — ABNORMAL LOW (ref 65–99)
Potassium: 3.6 mmol/L (ref 3.5–5.3)
SODIUM: 143 mmol/L (ref 135–146)
TOTAL PROTEIN: 6.4 g/dL (ref 6.1–8.1)
Total Bilirubin: 0.4 mg/dL (ref 0.2–1.2)

## 2015-12-31 LAB — HEMOGLOBIN A1C
HEMOGLOBIN A1C: 7.3 % — AB (ref <5.7)
Mean Plasma Glucose: 163 mg/dL

## 2016-01-06 ENCOUNTER — Ambulatory Visit (INDEPENDENT_AMBULATORY_CARE_PROVIDER_SITE_OTHER): Payer: Commercial Managed Care - HMO | Admitting: "Endocrinology

## 2016-01-06 ENCOUNTER — Encounter: Payer: Self-pay | Admitting: "Endocrinology

## 2016-01-06 VITALS — BP 135/74 | HR 94 | Ht 63.0 in | Wt 173.0 lb

## 2016-01-06 DIAGNOSIS — Z794 Long term (current) use of insulin: Secondary | ICD-10-CM | POA: Diagnosis not present

## 2016-01-06 DIAGNOSIS — IMO0001 Reserved for inherently not codable concepts without codable children: Secondary | ICD-10-CM

## 2016-01-06 DIAGNOSIS — E1165 Type 2 diabetes mellitus with hyperglycemia: Secondary | ICD-10-CM

## 2016-01-06 DIAGNOSIS — E782 Mixed hyperlipidemia: Secondary | ICD-10-CM | POA: Diagnosis not present

## 2016-01-06 NOTE — Progress Notes (Signed)
Subjective:    Patient ID: Ellen Jordan, female    DOB: 1945/10/20, PCP Ellen Bellow, MD   Past Medical History:  Diagnosis Date  . Cancer ([place])   . Diabetes mellitus without complication ([place])   . Hypercholesteremia   . Hypertension    Past Surgical History:  Procedure Laterality Date  . ABDOMINAL HYSTERECTOMY    . BACK SURGERY    . CARDIOVERSION N/A 03/11/2015   Procedure: CARDIOVERSION;  Surgeon: [name], MD;  Location: [place];  Service: Cardiovascular;  Laterality: N/A;  . CHOLECYSTECTOMY    . TEE WITHOUT CARDIOVERSION N/A 03/11/2015   Procedure: TRANSESOPHAGEAL ECHOCARDIOGRAM (TEE);  Surgeon: [name], MD;  Location: [facility] ENDOSCOPY;  Service: Cardiovascular;  Laterality: N/A;   Social History   Social History  . Marital status: Married    Spouse name: N/A  . Number of children: N/A  . Years of education: N/A   Social History Main Topics  . Smoking status: Former Smoker    Years: 40.00    Types: Cigarettes    Quit date: 01/10/2005  . Smokeless tobacco: Never Used  . Alcohol use No  . Drug use: No  . Sexual activity: Not Asked   Other Topics Concern  . None   Social History Narrative  . None   Outpatient Encounter Prescriptions as of 01/06/2016  Medication Sig  . ALPRAZolam (XANAX) 0.5 MG tablet Take 0.5 mg by mouth 3 (three) times daily.   . Ascorbic Acid (VITAMIN C GUMMIE PO) Take 2 each by mouth daily.  Marland Kitchen aspirin EC 81 MG tablet Take 81 mg by mouth daily.  Marland Kitchen b complex vitamins tablet Take 1 tablet by mouth daily.  . Biotin 5000 MCG CAPS Take 1 capsule by mouth daily.  . Calcium-Phosphorus-Vitamin D (CALCIUM GUMMIES PO) Take 500-1,000 mg by mouth daily.  . Cholecalciferol (VITAMIN D) 2000 units CAPS Take 1 capsule by mouth daily.  . Cinnamon 500 MG TABS Take 1,000 mg by mouth daily.   . diphenhydrAMINE (BENADRYL) 25 MG tablet Take 25 mg by mouth daily.  . furosemide (LASIX) 40 MG tablet Take 1 tablet (40 mg total) by mouth  daily.  . Garcinia Cambogia-Chromium 500-200 MG-MCG TABS Take 1 tablet by mouth daily.  . GNP ALCOHOL SWABS 70 % PADS USE FOUR TIMES DAILY  . insulin aspart (NOVOLOG) 100 UNIT/ML injection Inject 10-16 Units into the skin 3 (three) times daily with meals.  . insulin detemir (LEVEMIR) 100 UNIT/ML injection Inject 60 Units into the skin at bedtime.  . Liraglutide (VICTOZA) 18 MG/3ML SOPN Inject 1.8 mg into the skin daily.  Marland Kitchen lisinopril (PRINIVIL,ZESTRIL) 20 MG tablet Take 20 mg by mouth daily.  . metoprolol tartrate (LOPRESSOR) 50 MG tablet Take 1 tablet (50 mg total) by mouth 2 (two) times daily.  . Misc Natural Products (ESTROVEN + ENERGY MAX STRENGTH) TABS Take 1 tablet by mouth daily.  . Omega-3 Fatty Acids (FISH OIL) 1200 MG CAPS Take 1 capsule by mouth daily.  . rivaroxaban (XARELTO) 20 MG TABS tablet Take 1 tablet (20 mg total) by mouth daily with supper.  . sertraline (ZOLOFT) 100 MG tablet Take 100 mg by mouth daily.  . simvastatin (ZOCOR) 40 MG tablet Take 40 mg by mouth daily.  . vitamin E (VITAMIN E) 400 UNIT capsule Take 400 Units by mouth daily.   No facility-administered encounter medications on file as of 01/06/2016.    ALLERGIES: Allergies  Allergen Reactions  . Penicillins  Hives    Has patient had a PCN reaction causing immediate rash, facial/tongue/throat swelling, SOB or lightheadedness with hypotension: Yes Has patient had a PCN reaction causing severe rash involving mucus membranes or skin necrosis: No Has patient had a PCN reaction that required hospitalization No Has patient had a PCN reaction occurring within the last 10 years: No If all of the above answers are "NO", then may proceed with Cephalosporin use.    VACCINATION STATUS:  There is no immunization history on file for this patient.  Diabetes  She presents for her follow-up diabetic visit. She has type 2 diabetes mellitus. Onset time: She was diagnosed at approximate age of 43 years. Her disease course  has been worsening. There are no hypoglycemic associated symptoms. Pertinent negatives for hypoglycemia include no confusion, headaches, pallor or seizures. There are no diabetic associated symptoms. Pertinent negatives for diabetes include no chest pain, no polydipsia, no polyphagia and no polyuria. There are no hypoglycemic complications. Symptoms are worsening. There are no diabetic complications. Risk factors for coronary artery disease include diabetes mellitus, dyslipidemia, hypertension, obesity, sedentary lifestyle and tobacco exposure. Current diabetic treatment includes intensive insulin program. She is compliant with treatment most of the time. Her weight is decreasing steadily. She is following a generally unhealthy diet. She has not had a previous visit with a dietitian (She declined referral to CDE.). Her home blood glucose trend is decreasing steadily. Her breakfast blood glucose range is generally 140-180 mg/dl. Her lunch blood glucose range is generally 140-180 mg/dl. Her dinner blood glucose range is generally 140-180 mg/dl. Her overall blood glucose range is 140-180 mg/dl. An ACE inhibitor/angiotensin II receptor blocker is being taken. She does not see a podiatrist.Eye exam is not current (She is overdue for eye exam, I advised her to schedule a visit).  Hyperlipidemia  This is a chronic problem. The current episode started more than 1 year ago. Exacerbating diseases include diabetes and obesity. Pertinent negatives include no chest pain, leg pain, myalgias or shortness of breath. Current antihyperlipidemic treatment includes statins. Risk factors for coronary artery disease include dyslipidemia, diabetes mellitus, hypertension, a sedentary lifestyle and obesity.  Hypertension  This is a chronic problem. The current episode started more than 1 year ago. The problem is controlled. Pertinent negatives include no chest pain, headaches, palpitations or shortness of breath. Risk factors for  coronary artery disease include diabetes mellitus, dyslipidemia, obesity, sedentary lifestyle and smoking/tobacco exposure. Past treatments include ACE inhibitors. The current treatment provides moderate improvement.     Review of Systems  Constitutional: Negative for unexpected weight change.  HENT: Negative for trouble swallowing and voice change.   Eyes: Negative for visual disturbance.  Respiratory: Negative for cough, shortness of breath and wheezing.   Cardiovascular: Negative for chest pain, palpitations and leg swelling.  Gastrointestinal: Negative for diarrhea, nausea and vomiting.  Endocrine: Negative for cold intolerance, heat intolerance, polydipsia, polyphagia and polyuria.  Musculoskeletal: Negative for arthralgias and myalgias.  Skin: Negative for color change, pallor, rash and wound.  Neurological: Negative for seizures and headaches.  Psychiatric/Behavioral: Negative for confusion and suicidal ideas.    Objective:    BP 135/74   Pulse 94   Ht _0  (1.6 m)   Wt 173 lb (78.5 kg)   BMI 30.65 kg/m   Wt Readings from Last 3 Encounters:  01/06/16 173 lb (78.5 kg)  09/30/15 170 lb (77.1 kg)  07/01/15 169 lb (76.7 kg)    Physical Exam  Constitutional: She is oriented to  person, place, and time. She appears well-developed.  HENT:  Head: Normocephalic and atraumatic.  Eyes: EOM are normal.  Neck: Normal range of motion. Neck supple. No tracheal deviation present. No thyromegaly present.  Cardiovascular: Normal rate and regular rhythm.   Pulmonary/Chest: Effort normal and breath sounds normal.  Abdominal: Soft. Bowel sounds are normal. There is no tenderness. There is no guarding.  Musculoskeletal: Normal range of motion. She exhibits no edema.  Neurological: She is alert and oriented to person, place, and time. She has normal reflexes. No cranial nerve deficit. Coordination normal.  Skin: Skin is warm and dry. No rash noted. No erythema. No pallor.  Psychiatric: She  has a normal mood and affect. Judgment normal.    Results for orders placed or performed in visit on 12/30/15  Comprehensive metabolic panel  Result Value Ref Range   Sodium 143 135 - 146 mmol/L   Potassium 3.6 3.5 - 5.3 mmol/L   Chloride 106 98 - 110 mmol/L   CO2 27 20 - 31 mmol/L   Glucose, Bld 59 (L) 65 - 99 mg/dL   BUN 16 7 - 25 mg/dL   Creat 0.89 0.60 - 0.93 mg/dL   Total Bilirubin 0.4 0.2 - 1.2 mg/dL   Alkaline Phosphatase 52 33 - 130 U/L   AST 36 (H) 10 - 35 U/L   ALT 29 6 - 29 U/L   Total Protein 6.4 6.1 - 8.1 g/dL   Albumin 4.0 3.6 - 5.1 g/dL   Calcium 9.1 8.6 - 10.4 mg/dL  Hemoglobin A1c  Result Value Ref Range   Hgb A1c MFr Bld 7.3 (H) <5.7 %   Mean Plasma Glucose 163 mg/dL     Assessment & Plan:   1. Uncontrolled type 2 diabetes mellitus without complication, with long-term current use of insulin   - She remains at a high risk for more acute and chronic complications of diabetes which include CAD, CVA, CKD, retinopathy, and neuropathy. These are all discussed in detail with the patient.  Patient came with above target glucose profile, and  recent A1c 7.3% increasing from 6.8%,  overall improving from 14.6 %.  Glucose logs and insulin administration records pertaining to this visit,  to be scanned into patient's records.  Recent labs reviewed.   - I have re-counseled the patient on diet management  by adopting a carbohydrate restricted / protein rich  Diet.  - Suggestion is made for patient to avoid simple carbohydrates   from their diet including Cakes , Desserts, Ice Cream,  Soda (  diet and regular) , Sweet Tea , Candies,  Chips, Cookies, Artificial Sweeteners,   and "Sugar-free" Products .  This will help patient to have stable blood glucose profile and potentially avoid unintended  Weight gain.  - Patient is advised to stick to a routine mealtimes to eat 3 meals  a day and avoid unnecessary snacks ( to snack only to correct hypoglycemia).  - The patient   declined a referral to a  CDE for individualized DM education.  - I have approached patient with the following individualized plan to manage diabetes and patient agrees.  - Continue Levemir 60 units qhs, and Novolog  10 units TIDAC , plus correction dose associated with monitoring of blood glucose before meals and at bedtime.  -Continue Victoza at 1.8 mg sq q day.  - She would have benefited from resumption of   Invokana now that she has normal renal function. However she decided to hold off on  that for now. - Patient specific target  for A1c; LDL, HDL, Triglycerides, and  Waist Circumference were discussed in detail.  2) BP/HTN: controlled . Continue current medications including ACEI/ARB. 3) Lipids/HPL:  continue statins. 4)  Weight/Diet:  declined CDE consult, exercise, and carbohydrates information provided.  5) Chronic Care/Health Maintenance:  -Patient is on ACEI/ARB and Statin medications and encouraged to continue to follow up with Ophthalmology, Podiatrist at least yearly or according to recommendations, and advised to  stay away from smoking. I have recommended yearly flu vaccine and pneumonia vaccination at least every 5 years; moderate intensity exercise for up to 150 minutes weekly; and  sleep for at least 7 hours a day.  - 25 minutes of time was spent on the care of this patient , 50% of which was applied for counseling on diabetes complications and their preventions.  - I advised patient to maintain close follow up with Ellen Bellow, MD for primary care needs.  Patient is asked to bring meter and  blood glucose logs during their next visit.   Follow up plan: -No Follow-up on file.  Glade Lloyd, MD Phone: [phone]  Fax: [phone]   01/06/2016, 11:00 AM

## 2016-03-07 ENCOUNTER — Other Ambulatory Visit: Payer: Self-pay | Admitting: "Endocrinology

## 2016-03-17 ENCOUNTER — Other Ambulatory Visit: Payer: Self-pay | Admitting: "Endocrinology

## 2016-03-29 ENCOUNTER — Other Ambulatory Visit: Payer: Self-pay | Admitting: "Endocrinology

## 2016-03-29 LAB — COMPREHENSIVE METABOLIC PANEL
ALBUMIN: 4.1 g/dL (ref 3.6–5.1)
ALK PHOS: 54 U/L (ref 33–130)
ALT: 30 U/L — AB (ref 6–29)
AST: 34 U/L (ref 10–35)
BILIRUBIN TOTAL: 0.4 mg/dL (ref 0.2–1.2)
BUN: 10 mg/dL (ref 7–25)
CALCIUM: 9 mg/dL (ref 8.6–10.4)
CO2: 29 mmol/L (ref 20–31)
Chloride: 104 mmol/L (ref 98–110)
Creat: 0.84 mg/dL (ref 0.60–0.93)
Glucose, Bld: 160 mg/dL — ABNORMAL HIGH (ref 65–99)
POTASSIUM: 3.9 mmol/L (ref 3.5–5.3)
Sodium: 140 mmol/L (ref 135–146)
TOTAL PROTEIN: 6.4 g/dL (ref 6.1–8.1)

## 2016-03-30 ENCOUNTER — Other Ambulatory Visit (HOSPITAL_COMMUNITY): Payer: Self-pay | Admitting: Family Medicine

## 2016-03-30 ENCOUNTER — Other Ambulatory Visit (HOSPITAL_COMMUNITY)
Admission: RE | Admit: 2016-03-30 | Discharge: 2016-03-30 | Disposition: A | Discharge status: Home / Self Care | Payer: Medicare HMO | Source: Ambulatory Visit | Attending: Family Medicine | Admitting: Family Medicine

## 2016-03-30 ENCOUNTER — Ambulatory Visit (HOSPITAL_COMMUNITY)
Admission: RE | Admit: 2016-03-30 | Discharge: 2016-03-30 | Disposition: A | Discharge status: Home / Self Care | Payer: Medicare HMO | Source: Ambulatory Visit | Attending: Family Medicine | Admitting: Family Medicine

## 2016-03-30 DIAGNOSIS — I700 Atherosclerosis of aorta: Secondary | ICD-10-CM | POA: Insufficient documentation

## 2016-03-30 DIAGNOSIS — R0602 Shortness of breath: Secondary | ICD-10-CM

## 2016-03-30 DIAGNOSIS — Z7901 Long term (current) use of anticoagulants: Secondary | ICD-10-CM | POA: Diagnosis present

## 2016-03-30 DIAGNOSIS — I480 Paroxysmal atrial fibrillation: Secondary | ICD-10-CM | POA: Diagnosis present

## 2016-03-30 DIAGNOSIS — R0600 Dyspnea, unspecified: Secondary | ICD-10-CM | POA: Diagnosis present

## 2016-03-30 DIAGNOSIS — E1142 Type 2 diabetes mellitus with diabetic polyneuropathy: Secondary | ICD-10-CM | POA: Insufficient documentation

## 2016-03-30 DIAGNOSIS — R918 Other nonspecific abnormal finding of lung field: Secondary | ICD-10-CM | POA: Diagnosis not present

## 2016-03-30 LAB — CBC WITH DIFFERENTIAL/PLATELET
BASOS ABS: 0 10*3/uL (ref 0.0–0.1)
BASOS PCT: 0 %
Eosinophils Absolute: 0.1 10*3/uL (ref 0.0–0.7)
Eosinophils Relative: 2 %
HEMATOCRIT: 36 % (ref 36.0–46.0)
HEMOGLOBIN: 12.2 g/dL (ref 12.0–15.0)
Lymphocytes Relative: 34 %
Lymphs Abs: 2.3 10*3/uL (ref 0.7–4.0)
MCH: 29.1 pg (ref 26.0–34.0)
MCHC: 33.9 g/dL (ref 30.0–36.0)
MCV: 85.9 fL (ref 78.0–100.0)
MONOS PCT: 6 %
Monocytes Absolute: 0.4 10*3/uL (ref 0.1–1.0)
NEUTROS ABS: 3.9 10*3/uL (ref 1.7–7.7)
NEUTROS PCT: 58 %
Platelets: 172 10*3/uL (ref 150–400)
RBC: 4.19 MIL/uL (ref 3.87–5.11)
RDW: 13.7 % (ref 11.5–15.5)
WBC: 6.8 10*3/uL (ref 4.0–10.5)

## 2016-03-30 LAB — LIPID PANEL
Cholesterol: 132 mg/dL (ref 0–200)
HDL: 29 mg/dL — ABNORMAL LOW (ref >40)
LDL CALC: 54 mg/dL (ref 0–99)
TRIGLYCERIDES: 247 mg/dL — AB (ref <150)
Total CHOL/HDL Ratio: 4.6 RATIO
VLDL: 49 mg/dL — ABNORMAL HIGH (ref 0–40)

## 2016-03-30 LAB — COMPREHENSIVE METABOLIC PANEL
ALK PHOS: 61 U/L (ref 38–126)
ALT: 40 U/L (ref 14–54)
AST: 47 U/L — ABNORMAL HIGH (ref 15–41)
Albumin: 4.1 g/dL (ref 3.5–5.0)
Anion gap: 6 (ref 5–15)
BUN: 12 mg/dL (ref 6–20)
CALCIUM: 9.4 mg/dL (ref 8.9–10.3)
CO2: 29 mmol/L (ref 22–32)
CREATININE: 0.81 mg/dL (ref 0.44–1.00)
Chloride: 103 mmol/L (ref 101–111)
Glucose, Bld: 172 mg/dL — ABNORMAL HIGH (ref 65–99)
Potassium: 4.3 mmol/L (ref 3.5–5.1)
SODIUM: 138 mmol/L (ref 135–145)
Total Bilirubin: 0.3 mg/dL (ref 0.3–1.2)
Total Protein: 7.4 g/dL (ref 6.5–8.1)

## 2016-03-30 LAB — HEMOGLOBIN A1C
HEMOGLOBIN A1C: 7.6 % — AB (ref <5.7)
MEAN PLASMA GLUCOSE: 171 mg/dL

## 2016-03-30 LAB — BRAIN NATRIURETIC PEPTIDE: B Natriuretic Peptide: 43 pg/mL (ref 0.0–100.0)

## 2016-04-07 ENCOUNTER — Ambulatory Visit (INDEPENDENT_AMBULATORY_CARE_PROVIDER_SITE_OTHER): Payer: Commercial Managed Care - HMO | Admitting: "Endocrinology

## 2016-04-07 ENCOUNTER — Encounter: Payer: Self-pay | Admitting: "Endocrinology

## 2016-04-07 VITALS — BP 124/71 | HR 90 | Ht 63.0 in | Wt 175.0 lb

## 2016-04-07 DIAGNOSIS — I10 Essential (primary) hypertension: Secondary | ICD-10-CM | POA: Diagnosis not present

## 2016-04-07 DIAGNOSIS — E1165 Type 2 diabetes mellitus with hyperglycemia: Secondary | ICD-10-CM

## 2016-04-07 DIAGNOSIS — E782 Mixed hyperlipidemia: Secondary | ICD-10-CM

## 2016-04-07 DIAGNOSIS — IMO0001 Reserved for inherently not codable concepts without codable children: Secondary | ICD-10-CM

## 2016-04-07 DIAGNOSIS — Z794 Long term (current) use of insulin: Secondary | ICD-10-CM

## 2016-04-07 NOTE — Progress Notes (Signed)
Subjective:    Patient ID: Ellen Jordan, female    DOB: 1945/10/06, PCP Robert Bellow, MD   Past Medical History:  Diagnosis Date  . Cancer ([place])   . Diabetes mellitus without complication ([place])   . Hypercholesteremia   . Hypertension    Past Surgical History:  Procedure Laterality Date  . ABDOMINAL HYSTERECTOMY    . BACK SURGERY    . CARDIOVERSION N/A 03/11/2015   Procedure: CARDIOVERSION;  Surgeon: [name], MD;  Location: [facility];  Service: Cardiovascular;  Laterality: N/A;  . CHOLECYSTECTOMY    . TEE WITHOUT CARDIOVERSION N/A 03/11/2015   Procedure: TRANSESOPHAGEAL ECHOCARDIOGRAM (TEE);  Surgeon: [name], MD;  Location: [facility] ENDOSCOPY;  Service: Cardiovascular;  Laterality: N/A;   Social History   Social History  . Marital status: Married    Spouse name: N/A  . Number of children: N/A  . Years of education: N/A   Social History Main Topics  . Smoking status: Former Smoker    Years: 40.00    Types: Cigarettes    Quit date: 01/10/2005  . Smokeless tobacco: Never Used  . Alcohol use No  . Drug use: No  . Sexual activity: Not Asked   Other Topics Concern  . None   Social History Narrative  . None   Outpatient Encounter Prescriptions as of 04/07/2016  Medication Sig  . ACCU-CHEK FASTCLIX LANCETS MISC CHECK BLOOD GLUCOSE FOUR TIMES DAILY  . ACCU-CHEK SMARTVIEW test strip CHECK BLOOD GLUCOSE FOUR TIMES DAILY  . Alcohol Swabs (B-D SINGLE USE SWABS REGULAR) PADS USE FOUR TIMES DAILY  . ALPRAZolam (XANAX) 0.5 MG tablet Take 0.5 mg by mouth 3 (three) times daily.   . Ascorbic Acid (VITAMIN C GUMMIE PO) Take 2 each by mouth daily.  Marland Kitchen aspirin EC 81 MG tablet Take 81 mg by mouth daily.  Marland Kitchen b complex vitamins tablet Take 1 tablet by mouth daily.  . Biotin 5000 MCG CAPS Take 1 capsule by mouth daily.  . Calcium-Phosphorus-Vitamin D (CALCIUM GUMMIES PO) Take 500-1,000 mg by mouth daily.  . Cholecalciferol (VITAMIN D) 2000 units CAPS Take 1 capsule  by mouth daily.  . Cinnamon 500 MG TABS Take 1,000 mg by mouth daily.   . diphenhydrAMINE (BENADRYL) 25 MG tablet Take 25 mg by mouth daily.  . furosemide (LASIX) 40 MG tablet Take 1 tablet (40 mg total) by mouth daily.  . Garcinia Cambogia-Chromium 500-200 MG-MCG TABS Take 1 tablet by mouth daily.  . insulin aspart (NOVOLOG) 100 UNIT/ML injection Inject 10-16 Units into the skin 3 (three) times daily with meals.  . insulin detemir (LEVEMIR) 100 UNIT/ML injection Inject 60 Units into the skin at bedtime.  . Liraglutide (VICTOZA) 18 MG/3ML SOPN Inject 1.8 mg into the skin daily.  Marland Kitchen lisinopril (PRINIVIL,ZESTRIL) 20 MG tablet Take 20 mg by mouth daily.  . metoprolol tartrate (LOPRESSOR) 50 MG tablet Take 1 tablet (50 mg total) by mouth 2 (two) times daily.  . Misc Natural Products (ESTROVEN + ENERGY MAX STRENGTH) TABS Take 1 tablet by mouth daily.  . Omega-3 Fatty Acids (FISH OIL) 1200 MG CAPS Take 1 capsule by mouth daily.  . rivaroxaban (XARELTO) 20 MG TABS tablet Take 1 tablet (20 mg total) by mouth daily with supper.  . sertraline (ZOLOFT) 100 MG tablet Take 100 mg by mouth daily.  . simvastatin (ZOCOR) 40 MG tablet Take 40 mg by mouth daily.  . vitamin E (VITAMIN E) 400 UNIT capsule Take 400  Units by mouth daily.   No facility-administered encounter medications on file as of 04/07/2016.    ALLERGIES: Allergies  Allergen Reactions  . Penicillins Hives    Has patient had a PCN reaction causing immediate rash, facial/tongue/throat swelling, SOB or lightheadedness with hypotension: Yes Has patient had a PCN reaction causing severe rash involving mucus membranes or skin necrosis: No Has patient had a PCN reaction that required hospitalization No Has patient had a PCN reaction occurring within the last 10 years: No If all of the above answers are "NO", then may proceed with Cephalosporin use.    VACCINATION STATUS:  There is no immunization history on file for this patient.  Diabetes   She presents for her follow-up diabetic visit. She has type 2 diabetes mellitus. Onset time: She was diagnosed at approximate age of 81 years. Her disease course has been worsening. There are no hypoglycemic associated symptoms. Pertinent negatives for hypoglycemia include no confusion, headaches, pallor or seizures. There are no diabetic associated symptoms. Pertinent negatives for diabetes include no chest pain, no polydipsia, no polyphagia and no polyuria. There are no hypoglycemic complications. Symptoms are worsening. There are no diabetic complications. Risk factors for coronary artery disease include diabetes mellitus, dyslipidemia, hypertension, obesity, sedentary lifestyle and tobacco exposure. Current diabetic treatment includes intensive insulin program. She is compliant with treatment most of the time. Her weight is decreasing steadily. She is following a generally unhealthy diet. She has not had a previous visit with a dietitian (She declined referral to CDE.). Her home blood glucose trend is decreasing steadily. Her breakfast blood glucose range is generally 140-180 mg/dl. Her lunch blood glucose range is generally 140-180 mg/dl. Her dinner blood glucose range is generally 140-180 mg/dl. Her overall blood glucose range is 140-180 mg/dl. An ACE inhibitor/angiotensin II receptor blocker is being taken. She does not see a podiatrist.Eye exam is not current (She is overdue for eye exam, I advised her to schedule a visit).  Hyperlipidemia  This is a chronic problem. The current episode started more than 1 year ago. Exacerbating diseases include diabetes and obesity. Pertinent negatives include no chest pain, leg pain, myalgias or shortness of breath. Current antihyperlipidemic treatment includes statins. Risk factors for coronary artery disease include dyslipidemia, diabetes mellitus, hypertension, a sedentary lifestyle and obesity.  Hypertension  This is a chronic problem. The current episode started  more than 1 year ago. The problem is controlled. Pertinent negatives include no chest pain, headaches, palpitations or shortness of breath. Risk factors for coronary artery disease include diabetes mellitus, dyslipidemia, obesity, sedentary lifestyle and smoking/tobacco exposure. Past treatments include ACE inhibitors. The current treatment provides moderate improvement.     Review of Systems  Constitutional: Negative for unexpected weight change.  HENT: Negative for trouble swallowing and voice change.   Eyes: Negative for visual disturbance.  Respiratory: Negative for cough, shortness of breath and wheezing.   Cardiovascular: Negative for chest pain, palpitations and leg swelling.  Gastrointestinal: Negative for diarrhea, nausea and vomiting.  Endocrine: Negative for cold intolerance, heat intolerance, polydipsia, polyphagia and polyuria.  Musculoskeletal: Negative for arthralgias and myalgias.  Skin: Negative for color change, pallor, rash and wound.  Neurological: Negative for seizures and headaches.  Psychiatric/Behavioral: Negative for confusion and suicidal ideas.    Objective:    BP 124/71   Pulse 90   Ht _0  (1.6 m)   Wt 175 lb (79.4 kg)   BMI 31.00 kg/m   Wt Readings from Last 3 Encounters:  04/07/16 175  lb (79.4 kg)  01/06/16 173 lb (78.5 kg)  09/30/15 170 lb (77.1 kg)    Physical Exam  Constitutional: She is oriented to person, place, and time. She appears well-developed.  HENT:  Head: Normocephalic and atraumatic.  Eyes: EOM are normal.  Neck: Normal range of motion. Neck supple. No tracheal deviation present. No thyromegaly present.  Cardiovascular: Normal rate and regular rhythm.   Pulmonary/Chest: Effort normal and breath sounds normal.  Abdominal: Soft. Bowel sounds are normal. There is no tenderness. There is no guarding.  Musculoskeletal: Normal range of motion. She exhibits no edema.  Neurological: She is alert and oriented to person, place, and time.  She has normal reflexes. No cranial nerve deficit. Coordination normal.  Skin: Skin is warm and dry. No rash noted. No erythema. No pallor.  Psychiatric: She has a normal mood and affect. Judgment normal.    Results for orders placed or performed during the hospital encounter of 03/30/16  Comprehensive metabolic panel  Result Value Ref Range   Sodium 138 135 - 145 mmol/L   Potassium 4.3 3.5 - 5.1 mmol/L   Chloride 103 101 - 111 mmol/L   CO2 29 22 - 32 mmol/L   Glucose, Bld 172 (H) 65 - 99 mg/dL   BUN 12 6 - 20 mg/dL   Creatinine, Ser 0.81 0.44 - 1.00 mg/dL   Calcium 9.4 8.9 - 10.3 mg/dL   Total Protein 7.4 6.5 - 8.1 g/dL   Albumin 4.1 3.5 - 5.0 g/dL   AST 47 (H) 15 - 41 U/L   ALT 40 14 - 54 U/L   Alkaline Phosphatase 61 38 - 126 U/L   Total Bilirubin 0.3 0.3 - 1.2 mg/dL   GFR calc non Af Amer >60 >60 mL/min   GFR calc Af Amer >60 >60 mL/min   Anion gap 6 5 - 15  Lipid panel  Result Value Ref Range   Cholesterol 132 0 - 200 mg/dL   Triglycerides 247 (H) <150 mg/dL   HDL 29 (L) >40 mg/dL   Total CHOL/HDL Ratio 4.6 RATIO   VLDL 49 (H) 0 - 40 mg/dL   LDL Cholesterol 54 0 - 99 mg/dL  Brain natriuretic peptide  Result Value Ref Range   B Natriuretic Peptide 43.0 0.0 - 100.0 pg/mL  CBC with Differential/Platelet  Result Value Ref Range   WBC 6.8 4.0 - 10.5 K/uL   RBC 4.19 3.87 - 5.11 MIL/uL   Hemoglobin 12.2 12.0 - 15.0 g/dL   HCT 36.0 36.0 - 46.0 %   MCV 85.9 78.0 - 100.0 fL   MCH 29.1 26.0 - 34.0 pg   MCHC 33.9 30.0 - 36.0 g/dL   RDW 13.7 11.5 - 15.5 %   Platelets 172 150 - 400 K/uL   Neutrophils Relative % 58 %   Neutro Abs 3.9 1.7 - 7.7 K/uL   Lymphocytes Relative 34 %   Lymphs Abs 2.3 0.7 - 4.0 K/uL   Monocytes Relative 6 %   Monocytes Absolute 0.4 0.1 - 1.0 K/uL   Eosinophils Relative 2 %   Eosinophils Absolute 0.1 0.0 - 0.7 K/uL   Basophils Relative 0 %   Basophils Absolute 0.0 0.0 - 0.1 K/uL     Assessment & Plan:   1. Uncontrolled type 2 diabetes  mellitus without complication, with long-term current use of insulin   - She remains at a high risk for more acute and chronic complications of diabetes which include CAD, CVA, CKD, retinopathy, and neuropathy. These  are all discussed in detail with the patient.  Patient came with near target glucose profile, and  recent A1c of 7.6%, overall improving from 14.6 %.  Glucose logs and insulin administration records pertaining to this visit,  to be scanned into patient's records.  Recent labs reviewed.   - I have re-counseled the patient on diet management  by adopting a carbohydrate restricted / protein rich  Diet.  - Suggestion is made for patient to avoid simple carbohydrates   from their diet including Cakes , Desserts, Ice Cream,  Soda (  diet and regular) , Sweet Tea , Candies,  Chips, Cookies, Artificial Sweeteners,   and "Sugar-free" Products .  This will help patient to have stable blood glucose profile and potentially avoid unintended  Weight gain.  - Patient is advised to stick to a routine mealtimes to eat 3 meals  a day and avoid unnecessary snacks ( to snack only to correct hypoglycemia).  - The patient  declined a referral to a  CDE for individualized DM education.  - I have approached patient with the following individualized plan to manage diabetes and patient agrees.  - Continue Levemir 60 units qhs, and Novolog  10 units TIDAC , plus correction dose associated with monitoring of blood glucose before meals and at bedtime.  -Continue Victoza at 1.8 mg sq q day.  - She would have benefited from resumption of   Invokana now that she has normal renal function. However she decided to hold off on that for now. - Patient specific target  for A1c; LDL, HDL, Triglycerides, and  Waist Circumference were discussed in detail.  2) BP/HTN: controlled . Continue current medications including ACEI/ARB. 3) Lipids/HPL:  continue statins. 4)  Weight/Diet:  declined CDE consult, exercise, and  carbohydrates information provided.  5) Chronic Care/Health Maintenance:  -Patient is on ACEI/ARB and Statin medications and encouraged to continue to follow up with Ophthalmology, Podiatrist at least yearly or according to recommendations, and advised to  stay away from smoking. I have recommended yearly flu vaccine and pneumonia vaccination at least every 5 years; moderate intensity exercise for up to 150 minutes weekly; and  sleep for at least 7 hours a day.  - 25 minutes of time was spent on the care of this patient , 50% of which was applied for counseling on diabetes complications and their preventions.  - I advised patient to maintain close follow up with Robert Bellow, MD for primary care needs.  Patient is asked to bring meter and  blood glucose logs during their next visit.   Follow up plan: -Return in about 3 months (around 07/08/2016) for follow up with pre-visit labs, meter, and logs.  Glade Lloyd, MD Phone: [phone]  Fax: [phone]   04/07/2016, 11:38 AM

## 2016-04-12 ENCOUNTER — Telehealth: Payer: Self-pay | Admitting: Physician Assistant

## 2016-04-12 NOTE — Telephone Encounter (Signed)
Received records from [facility] for appointment on 04/22/16 with Almyra Deforest, PA.  Records put with Hao's schedule for 04/22/16. lp

## 2016-04-20 ENCOUNTER — Ambulatory Visit: Payer: Commercial Managed Care - HMO | Admitting: Physician Assistant

## 2016-04-22 ENCOUNTER — Ambulatory Visit: Payer: Medicare HMO | Admitting: Physician Assistant

## 2016-04-28 ENCOUNTER — Encounter: Payer: Self-pay | Admitting: Cardiology

## 2016-04-28 NOTE — Progress Notes (Signed)
Cardiology Office Note  Date: 04/29/2016   ID: Negin, Hegg [birth year], MRN [id number]  PCP: Robert Bellow, MD  Consulting Cardiologist: Rozann Lesches, MD   Chief Complaint  Patient presents with  . History of atrial fibrillation  . Dyspnea on exertion    History of Present Illness: TULLY BURGO is a 71 y.o. female referred for cardiology consultation by Dr. Karie Kirks for the consideration of cardiac monitoring with previous history of atrial fibrillation. I reviewed her records and updated the chart. She was seen by our practice last year due to an episode of atrial fibrillation and underwent successful TEE guided cardioversion in March 2017. She was placed on Xarelto at that time.She states that she took anticoagulation through the fall and then stopped it on her own. She has had no obvious recurrences.  CHADSVASC score is 4. I personally reviewed her ECG today which shows normal sinus rhythm with right bundle branch block and rightward axis. She does not endorse any palpitations or chest pain, however does mention that she has had progressive dyspnea on exertion with typical activities such as walking her dog outside around the house or doing other chores inside.  She has no known history of ischemic heart disease but active cardiac risk factors including type 2 diabetes mellitus, hypertension, and hyperlipidemia. She has not undergone any recent ischemic testing. LVEF was 50-55% by echocardiogram last year.  Past Medical History:  Diagnosis Date  . Allergic rhinitis   . Anxiety   . Carpal tunnel syndrome   . Depression   . Diabetic neuropathy ([place])   . Essential hypertension   . History of atrial fibrillation    TEE cardioversion March 2017  . Mixed hyperlipidemia   . Restless leg syndrome   . Type 2 diabetes mellitus ([place])     Past Surgical History:  Procedure Laterality Date  . ABDOMINAL HYSTERECTOMY    . BACK SURGERY    . CARDIOVERSION N/A 03/11/2015   Procedure: CARDIOVERSION;  Surgeon: [name], MD;  Location: [facility];  Service: Cardiovascular;  Laterality: N/A;  . CHOLECYSTECTOMY    . TEE WITHOUT CARDIOVERSION N/A 03/11/2015   Procedure: TRANSESOPHAGEAL ECHOCARDIOGRAM (TEE);  Surgeon: [name], MD;  Location: [facility] ENDOSCOPY;  Service: Cardiovascular;  Laterality: N/A;    Current Outpatient Prescriptions  Medication Sig Dispense Refill  . ACCU-CHEK FASTCLIX LANCETS MISC CHECK BLOOD GLUCOSE FOUR TIMES DAILY 408 each 2  . ACCU-CHEK SMARTVIEW test strip CHECK BLOOD GLUCOSE FOUR TIMES DAILY 400 each 2  . Alcohol Swabs (B-D SINGLE USE SWABS REGULAR) PADS USE FOUR TIMES DAILY 400 each 2  . ALPRAZolam (XANAX) 0.5 MG tablet Take 0.5 mg by mouth 3 (three) times daily.     . Ascorbic Acid (VITAMIN C GUMMIE PO) Take 2 each by mouth daily.    Marland Kitchen aspirin EC 81 MG tablet Take 81 mg by mouth daily.    Marland Kitchen b complex vitamins tablet Take 1 tablet by mouth daily.    . Biotin 5000 MCG CAPS Take 1 capsule by mouth daily.    . Calcium-Phosphorus-Vitamin D (CALCIUM GUMMIES PO) Take 500-1,000 mg by mouth daily.    . Cholecalciferol (VITAMIN D) 2000 units CAPS Take 1 capsule by mouth daily.    . diphenhydrAMINE (BENADRYL) 25 MG tablet Take 25 mg by mouth daily.    . insulin aspart (NOVOLOG) 100 UNIT/ML injection Inject 10-16 Units into the skin 3 (three) times daily with meals.    . insulin  detemir (LEVEMIR) 100 UNIT/ML injection Inject 60 Units into the skin at bedtime.    . Liraglutide (VICTOZA) 18 MG/3ML SOPN Inject 1.8 mg into the skin daily.    Marland Kitchen lisinopril (PRINIVIL,ZESTRIL) 20 MG tablet Take 20 mg by mouth daily.    . metoprolol tartrate (LOPRESSOR) 50 MG tablet Take 1 tablet (50 mg total) by mouth 2 (two) times daily. 60 tablet 0  . Misc Natural Products (ESTROVEN + ENERGY MAX STRENGTH) TABS Take 1 tablet by mouth daily.    . Omega-3 Fatty Acids (FISH OIL) 1200 MG CAPS Take 1 capsule by mouth daily.    . sertraline (ZOLOFT) 100 MG  tablet Take 100 mg by mouth daily.    . simvastatin (ZOCOR) 40 MG tablet Take 40 mg by mouth daily.    . vitamin E (VITAMIN E) 400 UNIT capsule Take 400 Units by mouth daily.     No current facility-administered medications for this visit.    Allergies:  Penicillins   Social History: The patient  reports that she quit smoking about 11 years ago. Her smoking use included Cigarettes. She quit after 40.00 years of use. She has never used smokeless tobacco. She reports that she does not drink alcohol or use drugs.   Family History: The patient's family history includes Cancer in her mother; Diabetes in her brother.   ROS:  Please see the history of present illness. Otherwise, complete review of systems is positive for fatigue.  All other systems are reviewed and negative.   Physical Exam: VS:  BP 134/60 (BP Location: Left Arm)   Pulse 90   Ht 5' 3.5" (1.613 m)   Wt 174 lb (78.9 kg)   SpO2 96%   BMI 30.34 kg/m , BMI Body mass index is 30.34 kg/m.  Wt Readings from Last 3 Encounters:  04/29/16 174 lb (78.9 kg)  04/07/16 175 lb (79.4 kg)  01/06/16 173 lb (78.5 kg)    General: Overweight woman, appears comfortable at rest. HEENT: Conjunctiva and lids normal, oropharynx clear. Neck: Supple, no elevated JVP or carotid bruits, no thyromegaly. Lungs: Clear to auscultation, nonlabored breathing at rest. Cardiac: Regular rate and rhythm, no S3, soft systolic murmur, no pericardial rub. Abdomen: Soft, nontender, bowel sounds present, no guarding or rebound. Extremities: No pitting edema, distal pulses 2+. Skin: Warm and dry. Musculoskeletal: No kyphosis. Neuropsychiatric: Alert and oriented x3, affect grossly appropriate.  ECG: I personally reviewed the tracing from 03/11/2015 which showed sinus rhythm with right bundle branch block and PAC.  Recent Labwork: 09/18/2015: TSH 1.96 03/30/2016: ALT 40; AST 47; B Natriuretic Peptide 43.0; BUN 12; Creatinine, Ser 0.81; Hemoglobin 12.2; Platelets  172; Potassium 4.3; Sodium 138     Component Value Date/Time   CHOL 132 03/30/2016 1206   TRIG 247 (H) 03/30/2016 1206   HDL 29 (L) 03/30/2016 1206   CHOLHDL 4.6 03/30/2016 1206   VLDL 49 (H) 03/30/2016 1206   LDLCALC 54 03/30/2016 1206    Other Studies Reviewed Today:  TEE 03/11/2015: Study Conclusions  - Left ventricle: Systolic function was normal. The estimated   ejection fraction was in the range of 50% to 55%. Wall motion was   normal; there were no regional wall motion abnormalities. - Aortic valve: No evidence of vegetation. No evidence of   vegetation. - Aorta: The aorta was normal, not dilated, there were moderate   non-mobile calcifications. - Mitral valve: Mildly calcified annulus. Mildly thickened leaflets   . There was mild regurgitation. - Left atrium:  The atrium was dilated. No evidence of thrombus in   the atrial cavity or appendage. No evidence of thrombus in the   atrial cavity or appendage. No evidence of thrombus in the atrial   cavity or appendage. The appendage was morphologically a left   appendage, multilobulated, and of normal size. Emptying velocity   were decreased. - Right atrium: No evidence of thrombus in the atrial cavity or   appendage. - Atrial septum: There was increased thickness of the septum,   consistent with lipomatous hypertrophy.  Impressions:  - TEE did not identify any source of intracardiac thrmbus. A   successful cardioversion followed the TEE.  Chest x-ray 03/30/2016: FINDINGS: Significantly improved bibasilar ventilation compared to February 2017. Pulmonary hyperinflation is evident today. Calcified aortic atherosclerosis. Other mediastinal contours are within normal limits. Visualized tracheal air column is within normal limits.  No pneumothorax, pleural effusion or confluent pulmonary opacity. Widespread increased pulmonary interstitial markings do not appear related to edema. Osteopenia. No acute osseous  abnormality identified. Cholecystectomy clips. Negative visible bowel gas pattern.  IMPRESSION: 1. Pulmonary hyperinflation with probably chronic pulmonary interstitial changes. Viral/atypical respiratory infection would be difficult to exclude. 2.  Calcified aortic atherosclerosis.  Assessment and Plan:  1. Dyspnea on exertion. She states that symptoms have been worsening in the last several months. Certainly could be multifactorial, but in light of active cardiac risk factors as discussed above, further ischemic testing is warranted. We will proceed with a Lexiscan Myoview to assess ischemic burden.  2. History of atrial fibrillation last March without obvious recurrence. Patient referred to consider further cardiac monitoring in light of her shortness of breath. She does not endorse any palpitations or heart rate irregularity, is in sinus rhythm today. We will obtain a 7 day event recorder to exclude any recurring atrial fibrillation. I did talk with her about long-term anticoagulation if rhythm recurs.  3. Hyperlipidemia, on Zocor. Recent LDL 54.  4. Essential hypertension, on lisinopril. Systolic blood pressure in the 130s today.  5. Type 2 diabetes mellitus, followed by Dr. Karie Kirks and Dr. Dorris Fetch. Recent hemoglobin A1c 7.6.   Current medicines were reviewed with the patient today.   Orders Placed This Encounter  Procedures  . NM Myocar Multi W/Spect W/Wall Motion / EF  . Cardiac event monitor  . EKG 12-Lead    Disposition: Follow-up in 1 month to discuss results.  Signed, Satira Sark, MD, [facility] 04/29/2016 8:56 AM    [facility] [place]. [address], [place], [place] [address] Phone: [phone]; Fax: [phone]

## 2016-04-29 ENCOUNTER — Ambulatory Visit (INDEPENDENT_AMBULATORY_CARE_PROVIDER_SITE_OTHER): Payer: Medicare HMO | Admitting: Cardiology

## 2016-04-29 ENCOUNTER — Encounter: Payer: Self-pay | Admitting: Cardiology

## 2016-04-29 VITALS — BP 134/60 | HR 90 | Ht 63.5 in | Wt 174.0 lb

## 2016-04-29 DIAGNOSIS — I10 Essential (primary) hypertension: Secondary | ICD-10-CM | POA: Diagnosis not present

## 2016-04-29 DIAGNOSIS — E1165 Type 2 diabetes mellitus with hyperglycemia: Secondary | ICD-10-CM

## 2016-04-29 DIAGNOSIS — Z8679 Personal history of other diseases of the circulatory system: Secondary | ICD-10-CM

## 2016-04-29 DIAGNOSIS — E782 Mixed hyperlipidemia: Secondary | ICD-10-CM | POA: Diagnosis not present

## 2016-04-29 DIAGNOSIS — R0609 Other forms of dyspnea: Secondary | ICD-10-CM | POA: Diagnosis not present

## 2016-04-29 DIAGNOSIS — IMO0002 Reserved for concepts with insufficient information to code with codable children: Secondary | ICD-10-CM

## 2016-04-29 DIAGNOSIS — I480 Paroxysmal atrial fibrillation: Secondary | ICD-10-CM

## 2016-04-29 DIAGNOSIS — E1140 Type 2 diabetes mellitus with diabetic neuropathy, unspecified: Secondary | ICD-10-CM

## 2016-04-29 NOTE — Patient Instructions (Signed)
Your physician recommends that you schedule a follow-up appointment in: 1 month   Your physician has recommended that you wear an event monitor. Event monitors are medical devices that record the heart's electrical activity. Doctors most often [place] these monitors to diagnose arrhythmias. Arrhythmias are problems with the speed or rhythm of the heartbeat. The monitor is a small, portable device. You can wear one while you do your normal daily activities. This is usually used to diagnose what is causing palpitations/syncope (passing out).    Your physician has requested that you have a lexiscan myoview. For further information please visit [email or web address]. Please follow instruction sheet, as given.     Your physician recommends that you continue on your current medications as directed. Please refer to the Current Medication list given to you today.     Thank you for choosing [facility] !

## 2016-05-09 ENCOUNTER — Encounter (HOSPITAL_BASED_OUTPATIENT_CLINIC_OR_DEPARTMENT_OTHER)
Admission: RE | Admit: 2016-05-09 | Discharge: 2016-05-09 | Disposition: A | Discharge status: Home / Self Care | Payer: Medicare HMO | Source: Ambulatory Visit | Attending: Cardiology | Admitting: Cardiology

## 2016-05-09 ENCOUNTER — Encounter (HOSPITAL_COMMUNITY)
Admission: RE | Admit: 2016-05-09 | Discharge: 2016-05-09 | Disposition: A | Discharge status: Home / Self Care | Payer: Medicare HMO | Source: Ambulatory Visit | Attending: Cardiology | Admitting: Cardiology

## 2016-05-09 ENCOUNTER — Encounter (HOSPITAL_COMMUNITY): Payer: Self-pay

## 2016-05-09 DIAGNOSIS — R0609 Other forms of dyspnea: Secondary | ICD-10-CM | POA: Insufficient documentation

## 2016-05-09 LAB — NM MYOCAR MULTI W/SPECT W/WALL MOTION / EF
CHL CUP RESTING HR STRESS: 77 {beats}/min
LHR: 0.28
LV sys vol: 9 mL
LVDIAVOL: 43 mL (ref 46–106)
Peak HR: 92 {beats}/min
SDS: 0
SRS: 3
SSS: 3
TID: 1.1

## 2016-05-09 MED ORDER — TECHNETIUM TC 99M TETROFOSMIN IV KIT
10.0000 | PACK | Freq: Once | INTRAVENOUS | Status: AC | PRN
  Administered 2016-05-09: 10 via INTRAVENOUS

## 2016-05-09 MED ORDER — TECHNETIUM TC 99M TETROFOSMIN IV KIT
30.0000 | PACK | Freq: Once | INTRAVENOUS | Status: AC | PRN
  Administered 2016-05-09: 30 via INTRAVENOUS

## 2016-05-09 MED ORDER — REGADENOSON 0.4 MG/5ML IV SOLN
INTRAVENOUS | Status: AC
  Administered 2016-05-09: 0.4 mg via INTRAVENOUS
  Filled 2016-05-09: qty 5

## 2016-05-09 MED ORDER — SODIUM CHLORIDE 0.9% FLUSH
INTRAVENOUS | Status: AC
  Administered 2016-05-09: 10 mL via INTRAVENOUS
  Filled 2016-05-09: qty 10

## 2016-05-11 ENCOUNTER — Telehealth: Payer: Self-pay | Admitting: Cardiology

## 2016-05-11 NOTE — Telephone Encounter (Signed)
Pt has not received her monitor yet

## 2016-05-12 NOTE — Telephone Encounter (Signed)
Resent [employer] order for 7 day monitor, pt aware

## 2016-05-19 ENCOUNTER — Ambulatory Visit (INDEPENDENT_AMBULATORY_CARE_PROVIDER_SITE_OTHER): Payer: Medicare HMO

## 2016-05-19 DIAGNOSIS — I480 Paroxysmal atrial fibrillation: Secondary | ICD-10-CM

## 2016-05-25 ENCOUNTER — Other Ambulatory Visit (HOSPITAL_COMMUNITY): Payer: Self-pay | Admitting: Family Medicine

## 2016-05-25 DIAGNOSIS — R42 Dizziness and giddiness: Secondary | ICD-10-CM

## 2016-05-26 ENCOUNTER — Telehealth: Payer: Self-pay | Admitting: Internal Medicine

## 2016-05-26 NOTE — Telephone Encounter (Signed)
[employer] cardiac monitoring service called the cardiology after hours line regarding patient developing atrial fibrillation with rapid ventricular rate.  Patient was contacted and only had an headache.  Informed cardiac monitoring service if patient continues to be in rapid ventricular rate, she should report to her local emergency department.   Liborio Nixon, MD

## 2016-05-26 NOTE — Telephone Encounter (Signed)
Noted. I met her recently in consultation. Will forward to nursing and have them contact the patient to see how she is doing. I suspect she is having paroxysmal atrial fibrillation. We did talk about resuming anticoagulation if we found further atrial fibrillation. She was previously on Xarelto. Please ensure that follow-up visit has been scheduled.

## 2016-05-26 NOTE — Telephone Encounter (Signed)
Apt scheduled  06/10/16 with Dr Domenic Polite

## 2016-05-27 ENCOUNTER — Other Ambulatory Visit: Payer: Self-pay

## 2016-05-27 MED ORDER — LIRAGLUTIDE 18 MG/3ML ~~LOC~~ SOPN: 1.8000 mg | PEN_INJECTOR | Freq: Every day | SUBCUTANEOUS | 1 refills | Status: DC

## 2016-05-27 MED ORDER — INSULIN ASPART 100 UNIT/ML ~~LOC~~ SOLN: 10.0000 [IU] | Freq: Three times a day (TID) | SUBCUTANEOUS | 1 refills | Status: DC

## 2016-05-27 MED ORDER — INSULIN DETEMIR 100 UNIT/ML ~~LOC~~ SOLN: 60.0000 [IU] | Freq: Every day | SUBCUTANEOUS | 1 refills | Status: DC

## 2016-05-30 ENCOUNTER — Telehealth: Payer: Self-pay | Admitting: Cardiology

## 2016-05-30 NOTE — Telephone Encounter (Signed)
Please schedule a visit with Ms. Ellen Primer PA-C this week in [place]. I am concerned based on her recent monitor that she is having fairly frequent PAF with RVR which could be contributing to her symptoms. Ischemic testing was however reassuring. She does need a formal echocardiogram in light of her shortness of breath and symptoms that also sound like heart failure to some degree. Main questions to address will be reinitiation of anticoagulation, possibility of initiating antiarrhythmic therapy depending on rhythm frequency, and whether or not she needs further adjustment in medications to address both blood pressure and volume status.

## 2016-05-30 NOTE — Telephone Encounter (Signed)
Will forward to [employer] to schedule appointment.

## 2016-05-30 NOTE — Telephone Encounter (Signed)
Returned pt call, she complains of having worsening sob. She states that she cannot hardly walk around the house without giving out of breath. She has been taking her blood pressure and she states " her heart rates are everywhere." ( 131/112 HR 102, 134/113 HR 77, 120/76 HR 101, 130/101 HR 100) She is also stating she is not able to lie flat as she has to use 2 pillows. This has been going on for the past few days. She just mailed her event monitor back Friday and she didn't have many episodes while wearing it. She is wondering if she needs to be seen in office before June 1st visit to address these issues. Please advise.

## 2016-05-30 NOTE — Telephone Encounter (Signed)
Patient states that she is feeling really weak and SOB. States that she has taken her BP x3 this morning and they have been high. / tg

## 2016-05-31 ENCOUNTER — Telehealth: Payer: Self-pay

## 2016-05-31 NOTE — Telephone Encounter (Signed)
-----  Message from Satira Sark, MD sent at 05/31/2016  8:39 AM EDT ----- Results reviewed. Patient with evidence of recurrent symptomatic atrial fibrillation with RVR. This was addressed with phone note from yesterday and plan to have the patient seen by APP this week in [place].  A copy of this test should be forwarded to Lemmie Evens, MD.

## 2016-05-31 NOTE — Telephone Encounter (Signed)
Pt aware of results, apt made for 06/03/16 at 3 pm with D Dunn

## 2016-06-01 ENCOUNTER — Other Ambulatory Visit (HOSPITAL_COMMUNITY): Payer: Self-pay | Admitting: Family Medicine

## 2016-06-01 DIAGNOSIS — R0602 Shortness of breath: Secondary | ICD-10-CM

## 2016-06-03 ENCOUNTER — Other Ambulatory Visit (HOSPITAL_COMMUNITY)
Admission: RE | Admit: 2016-06-03 | Discharge: 2016-06-03 | Disposition: A | Discharge status: Home / Self Care | Payer: Medicare HMO | Source: Ambulatory Visit | Attending: Physician Assistant | Admitting: Physician Assistant

## 2016-06-03 ENCOUNTER — Encounter: Payer: Self-pay | Admitting: Physician Assistant

## 2016-06-03 ENCOUNTER — Ambulatory Visit (INDEPENDENT_AMBULATORY_CARE_PROVIDER_SITE_OTHER): Payer: Medicare HMO | Admitting: Physician Assistant

## 2016-06-03 VITALS — BP 130/70 | HR 99 | Ht 63.0 in | Wt 174.0 lb

## 2016-06-03 DIAGNOSIS — I10 Essential (primary) hypertension: Secondary | ICD-10-CM

## 2016-06-03 DIAGNOSIS — R0609 Other forms of dyspnea: Secondary | ICD-10-CM | POA: Diagnosis not present

## 2016-06-03 DIAGNOSIS — I4510 Unspecified right bundle-branch block: Secondary | ICD-10-CM | POA: Diagnosis not present

## 2016-06-03 DIAGNOSIS — I480 Paroxysmal atrial fibrillation: Secondary | ICD-10-CM | POA: Insufficient documentation

## 2016-06-03 LAB — CBC WITH DIFFERENTIAL/PLATELET
BASOS ABS: 0 10*3/uL (ref 0.0–0.1)
BASOS PCT: 0 %
Eosinophils Absolute: 0.2 10*3/uL (ref 0.0–0.7)
Eosinophils Relative: 3 %
HEMATOCRIT: 38.4 % (ref 36.0–46.0)
HEMOGLOBIN: 12.4 g/dL (ref 12.0–15.0)
LYMPHS PCT: 35 %
Lymphs Abs: 2.9 10*3/uL (ref 0.7–4.0)
MCH: 28.2 pg (ref 26.0–34.0)
MCHC: 32.3 g/dL (ref 30.0–36.0)
MCV: 87.5 fL (ref 78.0–100.0)
MONO ABS: 0.7 10*3/uL (ref 0.1–1.0)
Monocytes Relative: 8 %
NEUTROS ABS: 4.5 10*3/uL (ref 1.7–7.7)
NEUTROS PCT: 54 %
Platelets: 244 10*3/uL (ref 150–400)
RBC: 4.39 MIL/uL (ref 3.87–5.11)
RDW: 14.4 % (ref 11.5–15.5)
WBC: 8.3 10*3/uL (ref 4.0–10.5)

## 2016-06-03 LAB — COMPREHENSIVE METABOLIC PANEL
ALBUMIN: 4 g/dL (ref 3.5–5.0)
ALT: 30 U/L (ref 14–54)
ANION GAP: 9 (ref 5–15)
AST: 43 U/L — ABNORMAL HIGH (ref 15–41)
Alkaline Phosphatase: 58 U/L (ref 38–126)
BUN: 9 mg/dL (ref 6–20)
CHLORIDE: 107 mmol/L (ref 101–111)
CO2: 23 mmol/L (ref 22–32)
Calcium: 9.3 mg/dL (ref 8.9–10.3)
Creatinine, Ser: 0.79 mg/dL (ref 0.44–1.00)
GFR calc Af Amer: >60 mL/min (ref >60)
Glucose, Bld: 77 mg/dL (ref 65–99)
POTASSIUM: 3.5 mmol/L (ref 3.5–5.1)
Sodium: 139 mmol/L (ref 135–145)
TOTAL PROTEIN: 7.2 g/dL (ref 6.5–8.1)
Total Bilirubin: 0.6 mg/dL (ref 0.3–1.2)

## 2016-06-03 LAB — TSH: TSH: 1.257 u[IU]/mL (ref 0.350–4.500)

## 2016-06-03 MED ORDER — DILTIAZEM HCL ER COATED BEADS 120 MG PO CP24: 120.0000 mg | ORAL_CAPSULE | Freq: Every day | ORAL | 3 refills | Status: DC

## 2016-06-03 MED ORDER — APIXABAN 5 MG PO TABS: 5.0000 mg | ORAL_TABLET | Freq: Two times a day (BID) | ORAL | 6 refills | Status: DC

## 2016-06-03 MED ORDER — ATORVASTATIN CALCIUM 40 MG PO TABS: 40.0000 mg | ORAL_TABLET | Freq: Every day | ORAL | 3 refills | Status: DC

## 2016-06-03 NOTE — Patient Instructions (Signed)
Your physician recommends that you schedule a follow-up appointment with Dr. Domenic Polite  Your physician has recommended you make the following change in your medication:  Stop Taking :  Aspirin  Simvastatin   Start Taking:   Eliquis 5 mg Two Times Daily ( You have been given samples today)   Atorvastatin 40 mg Daily at 9 pm   Diltiazem CD 120 mg Daily   If you need a refill on your cardiac medications before your next appointment, please call your pharmacy.  Thank you for choosing [facility]!

## 2016-06-03 NOTE — Progress Notes (Signed)
Cardiology Office Note    Date:  06/03/2016  ID:  Ellen Jordan, Ellen Jordan November 07, 1945, MRN [id number] PCP:  Lemmie Evens, MD  Cardiologist:  Dr. Domenic Polite  Chief Complaint: f/u atrial fib  History of Present Illness:  Ellen Jordan is a 71 y.o. female with history of paroxysmal atrial fibrillation, depression, HTN, [name], DM with neuropathy, RLS, anxiety, RBBB who presents for f/u of atrial fib. She was seen by our practice in 2017 due to an episode of atrial fibrillation and underwent successful TEE/DCCV 03/2015 She was placed on Xarelto at that time. She states that she took anticoagulation through the fall and then stopped it on her own. She was seen in clinic 04/29/16 with reports of progressive dyspnea on exertion but no CP or palpitations. Last CXR 03/2016 with pulmonary hyperinflation with probably chronic pulmonary interstitial changes; viral/atypical respiratory infection would be difficult to exclude, calcified aortic atherosclerosis. Dr. Domenic Polite ordered a nuclear stress test done 05/09/16 which was normal, EF 78%. 7 day event monitor showed NSR-sinus tach (HR 71bpm up to 166bpm) with average HR 89bpm; episodic atrial fib RVR with heart rate in 160s also noted.  Last echo done last year 03/2015 showed EF 50-55%, no RWMA, mild MR. Most recent labs demonstrated normal BNP (03/2016), LDL 54, Hgb 12.2, K 4.3, Cr 0.81, AST 47, ALT 40; normal TSH/fT4 in 09/2015.  She returns for follow-up with her family. Initial HR 99 with EKG demonstrating rapid atrial fib rate up to 157bpm. While sitting and resting her HR comes down to the 90s. She continues to report exertional DOE. She just doesn't do as much as she used to. The most activity she gets is walking the dog around the house. She does not exercise. Her dyspnea seems to come and go. Her awareness of her atrial fib is inconsistent - sometimes able to feel it, sometimes not. Right now she is resting comfortably but does note if she begins to walk, she tires out  easily. No CP, syncope, LEE, orthopnea. She is not hypoxic.   She reports that she has not been on metoprolol in quite some time - ran out of rx. She has been taking an old prescription for atenolol. She's not sure if she was taking this at the time of her monitor, but she knows she was not on metoprolol. She has not noticed any unusual bleeding.    Past Medical History:  Diagnosis Date  . Allergic rhinitis   . Anxiety   . Carpal tunnel syndrome   . Depression   . Diabetic neuropathy ([place])   . Essential hypertension   . Mixed hyperlipidemia   . PAF (paroxysmal atrial fibrillation) ([place])    a. s/p TEE cardioversion March 2017. b. Recurrence by event monitor 04/2016.  Marland Kitchen RBBB   . Restless leg syndrome   . Type 2 diabetes mellitus ([place])     Past Surgical History:  Procedure Laterality Date  . ABDOMINAL HYSTERECTOMY    . BACK SURGERY    . CARDIOVERSION N/A 03/11/2015   Procedure: CARDIOVERSION;  Surgeon: [name], MD;  Location: [facility];  Service: Cardiovascular;  Laterality: N/A;  . CHOLECYSTECTOMY    . TEE WITHOUT CARDIOVERSION N/A 03/11/2015   Procedure: TRANSESOPHAGEAL ECHOCARDIOGRAM (TEE);  Surgeon: [name], MD;  Location: [facility] ENDOSCOPY;  Service: Cardiovascular;  Laterality: N/A;    Current Medications: Outpatient Medications Prior to Visit  Medication Sig Dispense Refill  . ACCU-CHEK FASTCLIX LANCETS MISC CHECK BLOOD GLUCOSE FOUR TIMES DAILY  408 each 2  . ACCU-CHEK SMARTVIEW test strip CHECK BLOOD GLUCOSE FOUR TIMES DAILY 400 each 2  . Alcohol Swabs (B-D SINGLE USE SWABS REGULAR) PADS USE FOUR TIMES DAILY 400 each 2  . ALPRAZolam (XANAX) 0.5 MG tablet Take 0.5 mg by mouth 3 (three) times daily.     . Ascorbic Acid (VITAMIN C GUMMIE PO) Take 2 each by mouth daily.    Marland Kitchen aspirin EC 81 MG tablet Take 81 mg by mouth daily.    Marland Kitchen b complex vitamins tablet Take 1 tablet by mouth daily.    . Biotin 5000 MCG CAPS Take 1 capsule by mouth daily.    .  Calcium-Phosphorus-Vitamin D (CALCIUM GUMMIES PO) Take 500-1,000 mg by mouth daily.    . Cholecalciferol (VITAMIN D) 2000 units CAPS Take 1 capsule by mouth daily.    . diphenhydrAMINE (BENADRYL) 25 MG tablet Take 25 mg by mouth daily.    . insulin aspart (NOVOLOG) 100 UNIT/ML injection Inject 10-16 Units into the skin 3 (three) times daily with meals. 50 mL 1  . insulin detemir (LEVEMIR) 100 UNIT/ML injection Inject 0.6 mLs (60 Units total) into the skin at bedtime. 60 mL 1  . liraglutide (VICTOZA) 18 MG/3ML SOPN Inject 0.3 mLs (1.8 mg total) into the skin daily. 27 mL 1  . lisinopril (PRINIVIL,ZESTRIL) 20 MG tablet Take 20 mg by mouth daily.    . metoprolol tartrate (LOPRESSOR) 50 MG tablet Take 1 tablet (50 mg total) by mouth 2 (two) times daily. NOT TAKING _ patient taking unknown dose of atenolol instead, possibly 39m daily 60 tablet 0  . Misc Natural Products (ESTROVEN + ENERGY MAX STRENGTH) TABS Take 1 tablet by mouth daily.    . Omega-3 Fatty Acids (FISH OIL) 1200 MG CAPS Take 1 capsule by mouth daily.    . sertraline (ZOLOFT) 100 MG tablet Take 100 mg by mouth daily.    . simvastatin (ZOCOR) 40 MG tablet Take 40 mg by mouth daily.    . vitamin E (VITAMIN E) 400 UNIT capsule Take 400 Units by mouth daily.     No facility-administered medications prior to visit.      Allergies:   Penicillins   Social History   Social History  . Marital status: Married    Spouse name: N/A  . Number of children: N/A  . Years of education: N/A   Social History Main Topics  . Smoking status: Former Smoker    Years: 40.00    Types: Cigarettes    Quit date: 01/10/2005  . Smokeless tobacco: Never Used  . Alcohol use No  . Drug use: No  . Sexual activity: Not Asked   Other Topics Concern  . None   Social History Narrative  . None     Family History:  Family History  Problem Relation Age of Onset  . Cancer Mother   . Diabetes Brother   . COPD Father   . Hypertension Father       ROS:    Please see the history of present illness.  All other systems are reviewed and otherwise negative.    PHYSICAL EXAM:   VS:  BP 130/70   Pulse 99   Ht _0  (1.6 m)   Wt 174 lb (78.9 kg)   SpO2 95%   BMI 30.82 kg/m   BMI: Body mass index is 30.82 kg/m. GEN: Well nourished, well developed obese WF, in no acute distress  HEENT: normocephalic, atraumatic Neck: no JVD, carotid  bruits, or masses Cardiac: irregularly irregular, borderline rapid rate at rest, no murmurs, rubs, or gallops, no edema  Respiratory: clear to auscultation bilaterally, normal work of breathing GI: soft, nontender, nondistended, + BS MS: no deformity or atrophy  Skin: warm and dry, no rash Neuro:  Alert and Oriented x 3, Strength and sensation are intact, follows commands Psych: euthymic mood, full affect  Wt Readings from Last 3 Encounters:  06/03/16 174 lb (78.9 kg)  04/29/16 174 lb (78.9 kg)  04/07/16 175 lb (79.4 kg)      Studies/Labs Reviewed:   EKG:  EKG was ordered today and personally reviewed by me and demonstrates atrial fib 157bpm, incomplete RBBB, possible RVH  Recent Labs: 09/18/2015: TSH 1.96 03/30/2016: ALT 40; B Natriuretic Peptide 43.0; BUN 12; Creatinine, Ser 0.81; Hemoglobin 12.2; Platelets 172; Potassium 4.3; Sodium 138   Lipid Panel    Component Value Date/Time   CHOL 132 03/30/2016 1206   TRIG 247 (H) 03/30/2016 1206   HDL 29 (L) 03/30/2016 1206   CHOLHDL 4.6 03/30/2016 1206   VLDL 49 (H) 03/30/2016 1206   LDLCALC 54 03/30/2016 1206    Additional studies/ records that were reviewed today include: Summarized above.    ASSESSMENT & PLAN:   1. Paroxysmal atrial fib - HR remains variable and poorly controlled at times. In talking with the patient, at some point recently (unclear when), she went off her metoprolol and began taking an unknown dose of atenolol. This was prior to recent monitor. I asked her first and foremost to clarify this as soon as she got home and to call  us to update the list. Will continue this for now so as not to confuse the picture. Will add diltiazem 152m daily to see if this will aid with rate control and perhaps rhythm control. Will need to see back in a week to determine if she's had any improvement. If her paroxysms continue, will need to consider further med titration versus antiarrhythmic therapy. She has no known history of CAD so flecainide could be a consideration. CHADSVASC is 4 indicating high risk of stroke with atrial fib, warranting anticoagulation. Risks, benefits, alternatives reviewed with patient. She reports previous high cost with Xarelto. She is agreeable to trying Eliquis - samples given along with 30 day free card. I asked her to investigate cost with her insurance company and let [place] if this is cost prohibitive. I told her she would be at risk of stroke if she stopped and did not inform anyone. D/c aspirin given that we are starting Eliquis. Check baseline BMET, CBC, TSH. Once stable med regimen established, will need refills sent into her mail order for cost savings. Lastly, warning sx reviewed with patient. 2. Dyspnea on exertion - suspect this is linked to #1. If this does not resolve with control of her atrial fib, she may need pulm evaluation given possible chronic pulm interstitial changes on CXR 03/2016. Also suspect component of deconditioning given general sedentary lifestyle. 3. Hyperlipidemia - of note, she is on simvastatin which requires lower dose per FDA while on concomitant diltiazem. Given her DM she actually would benefit from at least moderate intensity statin per guideline directed therapy, so will change to atorvastatin 419mqpm. Add baseline LFTs to labs today. If the patient is tolerating statin at time of follow-up appointment, would consider rechecking liver function/lipid panel in 6 weeks. 4. Essential HTN - follow BP with med changes. 5. RBBB - no symptoms to suggest bradycardia.  Disposition:  F/u  with Dr. Domenic Polite as planned in 1 week for rate check.  Medication Adjustments/Labs and Tests Ordered: Current medicines are reviewed at length with the patient today.  Concerns regarding medicines are outlined above. Medication changes, Labs and Tests ordered today are summarized above and listed in the Patient Instructions accessible in Encounters.   Signed, Charlie Pitter, PA-C  06/03/2016 3:25 PM    [facility] in [place], [place] [address] Ph: [phone]; Fax [phone]

## 2016-06-06 ENCOUNTER — Telehealth: Payer: Self-pay | Admitting: Physician Assistant

## 2016-06-06 NOTE — Telephone Encounter (Signed)
Please call patient. Labs have resulted. For some reason they did not come to my box, nor anyone's that I can tell.   Baseline labs generally unrevealing. Potassium is slightly lower than what we would like to see for baseline. The last check however was totally normal. Would recommend to please increase dietary intake of healthy sources of potassium including bananas, squash, yogurt, white beans, sweet potatoes, leafy greens, and avocados.  Since we changed statin dose can you please put in an order for recheck LFTs/lipids in 6 weeks?  Also, please direct concern about the fact that labs did not go to anyone's box - maybe Lexine Baton [employer]) is the point of contact to speak with the lab? This is a huge safety issue because it seems to have happened with 2 separate patients on my schedule on Friday. This needs to be fixed before I can see clinic patients again. Thanks.  Nawal Burling PA-C

## 2016-06-07 ENCOUNTER — Telehealth: Payer: Self-pay

## 2016-06-07 DIAGNOSIS — E782 Mixed hyperlipidemia: Secondary | ICD-10-CM

## 2016-06-07 NOTE — Telephone Encounter (Signed)
Please call patient. Labs have resulted. For some reason they did not come to my box, nor anyone's that I can tell.   Baseline labs generally unrevealing. Potassium is slightly lower than what we would like to see for baseline. The last check however was totally normal. Would recommend to please increase dietary intake of healthy sources of potassium including bananas, squash, yogurt, white beans, sweet potatoes, leafy greens, and avocados.  Since we changed statin dose can you please put in an order for recheck LFTs/lipids in 6 weeks?    Pt notified, lab slip mailed     Patient confirmed dose of Atenolol is 50 mg daily, will FYI Dayna Dunn PA-C

## 2016-06-07 NOTE — Telephone Encounter (Signed)
Pt aware of lab results, mailed lab slip, EPIC ticket entered

## 2016-06-09 NOTE — Progress Notes (Signed)
Cardiology Office Note  Date: 06/10/2016   ID: [name], [name] [birth year], MRN [id number]  PCP: Lemmie Evens, MD  Primary Cardiologist: Rozann Lesches, MD   Chief Complaint  Patient presents with  . PAF    History of Present Illness: Ellen Jordan is a 71 y.o. female just seen in the office by Ms. Purcell Mouton on May 25 (I met her for the first time in April). She has been seen recently with recurrent atrial fibrillation with RVR. She was placed on Eliquis with CHADSVASC score of 4 and Cardizem CD 120 mg daily was also added to Atenolol 50 mg daily for heart rate control. She is here today with her husband and daughter. She states that she continues to feel episodes of shortness of breath and intermittent palpitations, although today her heart rate is regular and controlled. I did review some of her home blood pressure and heart rate checks.  Today we discussed the possibility of proceeding to antiarrhythmic therapy to see if getting better rhythm control leads to symptomatic improvement. We discussed initiating flecainide.  Recent ischemic testing was low risk as outlined below.  Past Medical History:  Diagnosis Date  . Allergic rhinitis   . Anxiety   . Carpal tunnel syndrome   . Depression   . Diabetic neuropathy ([place])   . Essential hypertension   . Mixed hyperlipidemia   . PAF (paroxysmal atrial fibrillation) ([place])    a. s/p TEE cardioversion March 2017. b. Recurrence by event monitor 04/2016.  Marland Kitchen RBBB   . Restless leg syndrome   . Type 2 diabetes mellitus ([place])     Past Surgical History:  Procedure Laterality Date  . ABDOMINAL HYSTERECTOMY    . BACK SURGERY    . CARDIOVERSION N/A 03/11/2015   Procedure: CARDIOVERSION;  Surgeon: [name], MD;  Location: [facility];  Service: Cardiovascular;  Laterality: N/A;  . CHOLECYSTECTOMY    . TEE WITHOUT CARDIOVERSION N/A 03/11/2015   Procedure: TRANSESOPHAGEAL ECHOCARDIOGRAM (TEE);  Surgeon: [name], MD;   Location[facility] ENDOSCOPY;  Service: Cardiovascular;  Laterality: N/A;    Current Outpatient Prescriptions  Medication Sig Dispense Refill  . ACCU-CHEK FASTCLIX LANCETS MISC CHECK BLOOD GLUCOSE FOUR TIMES DAILY 408 each 2  . ACCU-CHEK SMARTVIEW test strip CHECK BLOOD GLUCOSE FOUR TIMES DAILY 400 each 2  . Alcohol Swabs (B-D SINGLE USE SWABS REGULAR) PADS USE FOUR TIMES DAILY 400 each 2  . ALPRAZolam (XANAX) 0.5 MG tablet Take 0.5 mg by mouth at bedtime.     Marland Kitchen apixaban (ELIQUIS) 5 MG TABS tablet Take 1 tablet (5 mg total) by mouth 2 (two) times daily. 60 tablet 6  . atenolol (TENORMIN) 50 MG tablet Take 50 mg by mouth daily.     Marland Kitchen atorvastatin (LIPITOR) 40 MG tablet Take 1 tablet (40 mg total) by mouth daily. 30 tablet 3  . b complex vitamins tablet Take 1 tablet by mouth daily.    . Biotin 5000 MCG CAPS Take 1 capsule by mouth daily.    . Calcium-Phosphorus-Vitamin D (CALCIUM GUMMIES PO) Take 500-1,000 mg by mouth daily.    . chlorpheniramine (EQ CHLORTABS) 4 MG tablet Take 4 mg by mouth 2 (two) times daily as needed for allergies.    . cholecalciferol (VITAMIN D) 1000 units tablet Take 1,000 Units by mouth daily.    Marland Kitchen diltiazem (DILTIAZEM CD) 120 MG 24 hr capsule Take 1 capsule (120 mg total) by mouth daily. 30 capsule 3  .  diphenhydrAMINE (BENADRYL) 25 MG tablet Take 25 mg by mouth daily.    . furosemide (LASIX) 40 MG tablet Take 40 mg by mouth daily as needed.    . insulin aspart (NOVOLOG) 100 UNIT/ML injection Inject 10-16 Units into the skin 3 (three) times daily with meals. 50 mL 1  . insulin detemir (LEVEMIR) 100 UNIT/ML injection Inject 0.6 mLs (60 Units total) into the skin at bedtime. 60 mL 1  . liraglutide (VICTOZA) 18 MG/3ML SOPN Inject 0.3 mLs (1.8 mg total) into the skin daily. 27 mL 1  . lisinopril (PRINIVIL,ZESTRIL) 20 MG tablet Take 20 mg by mouth daily.    . Misc Natural Products (ESTROVEN + ENERGY MAX STRENGTH) TABS Take 1 tablet by mouth daily.    . Multiple Vitamin  (MULTIVITAMIN) tablet Take 1 tablet by mouth daily.    . Omega-3 Fatty Acids (FISH OIL) 1200 MG CAPS Take 1 capsule by mouth daily.    Marland Kitchen omeprazole (PRILOSEC) 20 MG capsule Take 1 capsule by mouth daily.    . sertraline (ZOLOFT) 100 MG tablet Take 100 mg by mouth daily.    . vitamin E (VITAMIN E) 400 UNIT capsule Take 400 Units by mouth daily.    . flecainide (TAMBOCOR) 50 MG tablet Take 1 tablet (50 mg total) by mouth 2 (two) times daily. 180 tablet 3   No current facility-administered medications for this visit.    Allergies:  Penicillins   Social History: The patient  reports that she quit smoking about 11 years ago. Her smoking use included Cigarettes. She quit after 40.00 years of use. She has never used smokeless tobacco. She reports that she does not drink alcohol or use drugs.   ROS:  Please see the history of present illness. Otherwise, complete review of systems is positive for some trouble with her memory.  All other systems are reviewed and negative.   Physical Exam: VS:  BP 128/67   Pulse 85   Ht 5' 3.5" (1.613 m)   Wt 175 lb (79.4 kg)   BMI 30.51 kg/m , BMI Body mass index is 30.51 kg/m.  Wt Readings from Last 3 Encounters:  06/10/16 175 lb (79.4 kg)  06/03/16 174 lb (78.9 kg)  04/29/16 174 lb (78.9 kg)    General: Overweight elderly woman, appears comfortable at rest. HEENT: Conjunctiva and lids normal, oropharynx clear. Neck: Supple, no elevated JVP or carotid bruits, no thyromegaly. Lungs: Diminished breath sounds without wheezing, nonlabored breathing at rest. Cardiac: Regular rate and rhythm, no S3, no pericardial rub. Abdomen: Soft, nontender, bowel sounds present. Extremities: No pitting edema, distal pulses 2+. Skin: Warm and dry. Musculoskeletal: No kyphosis. Neuropsychiatric: Alert and oriented x3, affect grossly appropriate.  ECG: I personally reviewed the tracing from 06/03/2016 which showed rapid atrial fibrillation with RBBB.  Recent  Labwork: 03/30/2016: B Natriuretic Peptide 43.0 06/03/2016: ALT 30; AST 43; BUN 9; Creatinine, Ser 0.79; Hemoglobin 12.4; Platelets 244; Potassium 3.5; Sodium 139; TSH 1.257     Component Value Date/Time   CHOL 132 03/30/2016 1206   TRIG 247 (H) 03/30/2016 1206   HDL 29 (L) 03/30/2016 1206   CHOLHDL 4.6 03/30/2016 1206   VLDL 49 (H) 03/30/2016 1206   LDLCALC 54 03/30/2016 1206    Other Studies Reviewed Today:  [facility] Myoview 05/09/2016:  There was no ST segment deviation noted during stress.  The study is normal. No ischemia or scar.  This is a low risk study.  Nuclear stress EF: 78%.  TEE 03/11/2015: Study  Conclusions  - Left ventricle: Systolic function was normal. The estimated ejection fraction was in the range of 50% to 55%. Wall motion was normal; there were no regional wall motion abnormalities. - Aortic valve: No evidence of vegetation. No evidence of vegetation. - Aorta: The aorta was normal, not dilated, there were moderate non-mobile calcifications. - Mitral valve: Mildly calcified annulus. Mildly thickened leaflets . There was mild regurgitation. - Left atrium: The atrium was dilated. No evidence of thrombus in the atrial cavity or appendage. No evidence of thrombus in the atrial cavity or appendage. No evidence of thrombus in the atrial cavity or appendage. The appendage was morphologically a left appendage, multilobulated, and of normal size. Emptying velocity were decreased. - Right atrium: No evidence of thrombus in the atrial cavity or appendage. - Atrial septum: There was increased thickness of the septum, consistent with lipomatous hypertrophy.  Impressions:  - TEE did not identify any source of intracardiac thrmbus. A successful cardioversion followed the TEE.  Assessment and Plan:  1. Symptomatic paroxysmal atrial fibrillation with CHADSVASC score 4. She is tolerating Eliquis so far, we checked for samples today as  well given some financial constraints. As noted above we will initiate flecainide beginning at 50 mg twice daily to hopefully achieve better rhythm control. Otherwise continue atenolol and Cardizem CD. Follow-up arranged. She already underwent ischemic testing in April that was low risk.  2. Essential hypertension, blood pressure adequately controlled today.  3. Hyperlipidemia on Lipitor. Recent LDL 54.  4. Type 2 diabetes mellitus followed by Dr. Karie Kirks.  Current medicines were reviewed with the patient today.  Disposition: Follow-up in one month.  Signed, Satira Sark, MD, [facility] 06/10/2016 4:32 PM    [facility] at [facility] [address] [place], [place] [address] Phone: [phone]; Fax: [phone]

## 2016-06-10 ENCOUNTER — Ambulatory Visit (INDEPENDENT_AMBULATORY_CARE_PROVIDER_SITE_OTHER): Payer: Medicare HMO | Admitting: Cardiology

## 2016-06-10 ENCOUNTER — Encounter: Payer: Self-pay | Admitting: Cardiology

## 2016-06-10 VITALS — BP 128/67 | HR 85 | Ht 63.5 in | Wt 175.0 lb

## 2016-06-10 DIAGNOSIS — I480 Paroxysmal atrial fibrillation: Secondary | ICD-10-CM

## 2016-06-10 DIAGNOSIS — IMO0002 Reserved for concepts with insufficient information to code with codable children: Secondary | ICD-10-CM

## 2016-06-10 DIAGNOSIS — I10 Essential (primary) hypertension: Secondary | ICD-10-CM | POA: Diagnosis not present

## 2016-06-10 DIAGNOSIS — E782 Mixed hyperlipidemia: Secondary | ICD-10-CM | POA: Diagnosis not present

## 2016-06-10 DIAGNOSIS — E1140 Type 2 diabetes mellitus with diabetic neuropathy, unspecified: Secondary | ICD-10-CM

## 2016-06-10 DIAGNOSIS — E1165 Type 2 diabetes mellitus with hyperglycemia: Secondary | ICD-10-CM

## 2016-06-10 MED ORDER — FLECAINIDE ACETATE 50 MG PO TABS: 50.0000 mg | ORAL_TABLET | Freq: Two times a day (BID) | ORAL | 3 refills | Status: DC

## 2016-06-10 NOTE — Patient Instructions (Signed)
Your physician recommends that you schedule a follow-up appointment in: 1 month Dr Domenic Polite    START Flecainide 50 mg twice a day       Thank you for choosing [facility] !

## 2016-06-20 ENCOUNTER — Encounter (HOSPITAL_COMMUNITY): Payer: Self-pay

## 2016-06-20 ENCOUNTER — Ambulatory Visit (HOSPITAL_COMMUNITY)
Admission: RE | Admit: 2016-06-20 | Discharge: 2016-06-20 | Disposition: A | Discharge status: Home / Self Care | Payer: Medicare HMO | Source: Ambulatory Visit | Attending: Family Medicine | Admitting: Family Medicine

## 2016-06-20 DIAGNOSIS — R0602 Shortness of breath: Secondary | ICD-10-CM | POA: Diagnosis not present

## 2016-06-20 DIAGNOSIS — I6523 Occlusion and stenosis of bilateral carotid arteries: Secondary | ICD-10-CM | POA: Diagnosis not present

## 2016-07-12 ENCOUNTER — Other Ambulatory Visit: Payer: Self-pay | Admitting: "Endocrinology

## 2016-07-12 LAB — COMPREHENSIVE METABOLIC PANEL
ALT: 26 U/L (ref 6–29)
AST: 30 U/L (ref 10–35)
Albumin: 4.1 g/dL (ref 3.6–5.1)
Alkaline Phosphatase: 60 U/L (ref 33–130)
BILIRUBIN TOTAL: 0.4 mg/dL (ref 0.2–1.2)
BUN: 9 mg/dL (ref 7–25)
CO2: 26 mmol/L (ref 20–31)
CREATININE: 0.83 mg/dL (ref 0.60–0.93)
Calcium: 8.9 mg/dL (ref 8.6–10.4)
Chloride: 107 mmol/L (ref 98–110)
GLUCOSE: 95 mg/dL (ref 65–99)
Potassium: 3.7 mmol/L (ref 3.5–5.3)
SODIUM: 143 mmol/L (ref 135–146)
Total Protein: 6.7 g/dL (ref 6.1–8.1)

## 2016-07-13 LAB — HEMOGLOBIN A1C
Hgb A1c MFr Bld: 6.9 % — ABNORMAL HIGH (ref <5.7)
Mean Plasma Glucose: 151 mg/dL

## 2016-07-20 ENCOUNTER — Encounter: Payer: Self-pay | Admitting: Surgery

## 2016-07-20 NOTE — Progress Notes (Signed)
Cardiology Office Note  Date: 07/21/2016   ID: Symphonie, Schneiderman [birth year], MRN [id number]  PCP: Lemmie Evens, MD  Primary Cardiologist: Rozann Lesches, MD   Chief Complaint  Patient presents with  . PAF    History of Present Illness: Ellen Jordan is a 71 y.o. female last seen in June.She is here with her husband and daughter today for a follow-up visit. She still complains of fatigue, intermittent wheezing and shortness of breath. Not palpitations however. She was initiated on flecainide at the last visit in an effort to achieve better rhythm control.  I reviewed her medications which are outlined below. She states that she has been compliant, has tolerated flecainide.  It is not clear to me at this point whether recurring atrial fibrillation is the explanation for all of her symptoms reported. Review of home blood pressure and heart rate shows fairly good blood pressure control and normal heart rates. We discussed a follow-up period of monitoring now the medications of been adjusted. She is also in the process of switching PCP to Dr. Meda Coffee. PFTs would be a consideration.  Past Medical History:  Diagnosis Date  . Allergic rhinitis   . Anxiety   . Carpal tunnel syndrome   . Depression   . Diabetic neuropathy ([place])   . Essential hypertension   . Mixed hyperlipidemia   . PAF (paroxysmal atrial fibrillation) ([place])    a. s/p TEE cardioversion March 2017. b. Recurrence by event monitor 04/2016.  Marland Kitchen RBBB   . Restless leg syndrome   . Type 2 diabetes mellitus ([place])     Past Surgical History:  Procedure Laterality Date  . ABDOMINAL HYSTERECTOMY    . BACK SURGERY    . CARDIOVERSION N/A 03/11/2015   Procedure: CARDIOVERSION;  Surgeon: [name], MD;  Location: [facility];  Service: Cardiovascular;  Laterality: N/A;  . CHOLECYSTECTOMY    . TEE WITHOUT CARDIOVERSION N/A 03/11/2015   Procedure: TRANSESOPHAGEAL ECHOCARDIOGRAM (TEE);  Surgeon: [name], MD;   Location: [facility] ENDOSCOPY;  Service: Cardiovascular;  Laterality: N/A;    Current Outpatient Prescriptions  Medication Sig Dispense Refill  . ACCU-CHEK FASTCLIX LANCETS MISC CHECK BLOOD GLUCOSE FOUR TIMES DAILY 408 each 2  . ACCU-CHEK SMARTVIEW test strip CHECK BLOOD GLUCOSE FOUR TIMES DAILY 400 each 2  . Alcohol Swabs (B-D SINGLE USE SWABS REGULAR) PADS USE FOUR TIMES DAILY 400 each 2  . ALPRAZolam (XANAX) 0.5 MG tablet Take 0.5 mg by mouth at bedtime.     Marland Kitchen apixaban (ELIQUIS) 5 MG TABS tablet Take 1 tablet (5 mg total) by mouth 2 (two) times daily. 60 tablet 6  . atenolol (TENORMIN) 50 MG tablet Take 50 mg by mouth daily.     Marland Kitchen atorvastatin (LIPITOR) 40 MG tablet Take 1 tablet (40 mg total) by mouth daily. 30 tablet 3  . b complex vitamins tablet Take 1 tablet by mouth daily.    . Biotin 5000 MCG CAPS Take 1 capsule by mouth daily.    . Calcium-Phosphorus-Vitamin D (CALCIUM GUMMIES PO) Take 500-1,000 mg by mouth daily.    . chlorpheniramine (EQ CHLORTABS) 4 MG tablet Take 4 mg by mouth 2 (two) times daily as needed for allergies.    . cholecalciferol (VITAMIN D) 1000 units tablet Take 1,000 Units by mouth daily.    Marland Kitchen diltiazem (DILTIAZEM CD) 120 MG 24 hr capsule Take 1 capsule (120 mg total) by mouth daily. 30 capsule 3  . diphenhydrAMINE (BENADRYL) 25 MG tablet  Take 25 mg by mouth daily.    . flecainide (TAMBOCOR) 50 MG tablet Take 1 tablet (50 mg total) by mouth 2 (two) times daily. 180 tablet 3  . furosemide (LASIX) 40 MG tablet Take 40 mg by mouth daily as needed.    . insulin aspart (NOVOLOG) 100 UNIT/ML injection Inject 10-16 Units into the skin 3 (three) times daily with meals. 50 mL 1  . insulin detemir (LEVEMIR) 100 UNIT/ML injection Inject 0.6 mLs (60 Units total) into the skin at bedtime. 60 mL 1  . liraglutide (VICTOZA) 18 MG/3ML SOPN Inject 0.3 mLs (1.8 mg total) into the skin daily. 27 mL 1  . lisinopril (PRINIVIL,ZESTRIL) 20 MG tablet Take 20 mg by mouth daily.    . Misc  Natural Products (ESTROVEN + ENERGY MAX STRENGTH) TABS Take 1 tablet by mouth daily.    . Multiple Vitamin (MULTIVITAMIN) tablet Take 1 tablet by mouth daily.    . Omega-3 Fatty Acids (FISH OIL) 1200 MG CAPS Take 1 capsule by mouth daily.    Marland Kitchen omeprazole (PRILOSEC) 20 MG capsule Take 1 capsule by mouth daily.    . sertraline (ZOLOFT) 100 MG tablet Take 100 mg by mouth daily.    . vitamin E (VITAMIN E) 400 UNIT capsule Take 400 Units by mouth daily.     No current facility-administered medications for this visit.    Allergies:  Penicillins   Social History: The patient  reports that she quit smoking about 11 years ago. Her smoking use included Cigarettes. She quit after 40.00 years of use. She has never used smokeless tobacco. She reports that she does not drink alcohol or use drugs.   ROS:  Please see the history of present illness. Otherwise, complete review of systems is positive for fatigue.  All other systems are reviewed and negative.   Physical Exam: VS:  BP 132/66   Pulse 78   Ht _0  (1.6 m)   Wt 173 lb (78.5 kg)   SpO2 98%   BMI 30.65 kg/m , BMI Body mass index is 30.65 kg/m.  Wt Readings from Last 3 Encounters:  07/21/16 173 lb (78.5 kg)  06/10/16 175 lb (79.4 kg)  06/03/16 174 lb (78.9 kg)    General: Overweight elderly woman, appears comfortable at rest. HEENT: Conjunctiva and lids normal, oropharynx clear. Neck: Supple, no elevated JVP or carotid bruits, no thyromegaly. Lungs: Diminished breath sounds without wheezing, nonlabored breathing at rest. Cardiac: Regular rate and rhythm, no S3, no pericardial rub. Abdomen: Soft, nontender, bowel sounds present. Extremities: No pitting edema, distal pulses 2+. Skin: Warm and dry. Musculoskeletal: No kyphosis. Neuropsychiatric: Alert and oriented x3, affect grossly appropriate.  ECG: I personally reviewed the tracing from 06/03/2016 which showed rapid atrial fibrillation with RBBB.  Recent Labwork: 03/30/2016: B  Natriuretic Peptide 43.0 06/03/2016: Hemoglobin 12.4; Platelets 244; TSH 1.257 07/12/2016: ALT 26; AST 30; BUN 9; Creat 0.83; Potassium 3.7; Sodium 143     Component Value Date/Time   CHOL 132 03/30/2016 1206   TRIG 247 (H) 03/30/2016 1206   HDL 29 (L) 03/30/2016 1206   CHOLHDL 4.6 03/30/2016 1206   VLDL 49 (H) 03/30/2016 1206   LDLCALC 54 03/30/2016 1206    Other Studies Reviewed Today:  [facility] Myoview 05/09/2016:  There was no ST segment deviation noted during stress.  The study is normal. No ischemia or scar.  This is a low risk study.  Nuclear stress EF: 78%.  TEE 03/11/2015: Study Conclusions  - Left ventricle:  Systolic function was normal. The estimated ejection fraction was in the range of 50% to 55%. Wall motion was normal; there were no regional wall motion abnormalities. - Aortic valve: No evidence of vegetation. No evidence of vegetation. - Aorta: The aorta was normal, not dilated, there were moderate non-mobile calcifications. - Mitral valve: Mildly calcified annulus. Mildly thickened leaflets . There was mild regurgitation. - Left atrium: The atrium was dilated. No evidence of thrombus in the atrial cavity or appendage. No evidence of thrombus in the atrial cavity or appendage. No evidence of thrombus in the atrial cavity or appendage. The appendage was morphologically a left appendage, multilobulated, and of normal size. Emptying velocity were decreased. - Right atrium: No evidence of thrombus in the atrial cavity or appendage. - Atrial septum: There was increased thickness of the septum, consistent with lipomatous hypertrophy.  Impressions:  - TEE did not identify any source of intracardiac thrmbus. A successful cardioversion followed the TEE.  Assessment and Plan:  1. Paroxysmal atrial fibrillation with CHADSVASC score 4. She continues on Eliquis for stroke prophylaxis. Also Cardizem CD, atenolol, and recently added  flecainide for rhythm control. It is not clear to me whether all of the symptoms she is reporting are related to PAF. We will obtain a follow-up period of monitoring to see if rhythm control is better. If not, flecainide could be increased to 100 mg dose.  2. Essential hypertension, blood pressure control looks to be adequate. I reviewed her home blood pressure recordings.  3. Hyperlipidemia on Lipitor.  4. Moderate carotid artery disease, 50-69% range by recent carotid Dopplers. She reports pending visit with Vascular in [place].  Current medicines were reviewed with the patient today.   Orders Placed This Encounter  Procedures  . Holter monitor - 48 hour    Disposition: Call with test results and determine disposition.  Signed, Satira Sark, MD, [facility] 07/21/2016 9:30 AM    [facility] at [facility] [address], [place], [place] [address] Phone: [phone]; Fax: [phone]

## 2016-07-21 ENCOUNTER — Encounter: Payer: Self-pay | Admitting: "Endocrinology

## 2016-07-21 ENCOUNTER — Ambulatory Visit (INDEPENDENT_AMBULATORY_CARE_PROVIDER_SITE_OTHER): Payer: Medicare HMO | Admitting: "Endocrinology

## 2016-07-21 ENCOUNTER — Encounter: Payer: Self-pay | Admitting: Cardiology

## 2016-07-21 ENCOUNTER — Ambulatory Visit (INDEPENDENT_AMBULATORY_CARE_PROVIDER_SITE_OTHER): Payer: Medicare HMO | Admitting: Cardiology

## 2016-07-21 VITALS — BP 132/66 | HR 78 | Ht 63.0 in | Wt 173.0 lb

## 2016-07-21 VITALS — BP 111/62 | HR 79 | Ht 63.0 in | Wt 174.0 lb

## 2016-07-21 DIAGNOSIS — I779 Disorder of arteries and arterioles, unspecified: Secondary | ICD-10-CM | POA: Diagnosis not present

## 2016-07-21 DIAGNOSIS — Z794 Long term (current) use of insulin: Secondary | ICD-10-CM | POA: Diagnosis not present

## 2016-07-21 DIAGNOSIS — E1165 Type 2 diabetes mellitus with hyperglycemia: Secondary | ICD-10-CM | POA: Diagnosis not present

## 2016-07-21 DIAGNOSIS — I480 Paroxysmal atrial fibrillation: Secondary | ICD-10-CM | POA: Diagnosis not present

## 2016-07-21 DIAGNOSIS — I739 Peripheral vascular disease, unspecified: Secondary | ICD-10-CM

## 2016-07-21 DIAGNOSIS — I10 Essential (primary) hypertension: Secondary | ICD-10-CM

## 2016-07-21 DIAGNOSIS — E782 Mixed hyperlipidemia: Secondary | ICD-10-CM

## 2016-07-21 DIAGNOSIS — IMO0001 Reserved for inherently not codable concepts without codable children: Secondary | ICD-10-CM

## 2016-07-21 MED ORDER — INSULIN DETEMIR 100 UNIT/ML ~~LOC~~ SOLN: 50.0000 [IU] | Freq: Every day | SUBCUTANEOUS | 1 refills | Status: DC

## 2016-07-21 NOTE — Progress Notes (Signed)
Subjective:    Patient ID: Ellen Jordan, female    DOB: [birth year], PCP Lemmie Evens, MD   Past Medical History:  Diagnosis Date  . Allergic rhinitis   . Anxiety   . Carpal tunnel syndrome   . Depression   . Diabetic neuropathy ([place])   . Essential hypertension   . Mixed hyperlipidemia   . PAF (paroxysmal atrial fibrillation) ([place])    a. s/p TEE cardioversion March 2017. b. Recurrence by event monitor 04/2016.  Marland Kitchen RBBB   . Restless leg syndrome   . Type 2 diabetes mellitus ([place])    Past Surgical History:  Procedure Laterality Date  . ABDOMINAL HYSTERECTOMY    . BACK SURGERY    . CARDIOVERSION N/A 03/11/2015   Procedure: CARDIOVERSION;  Surgeon: [name], MD;  Location: [facility];  Service: Cardiovascular;  Laterality: N/A;  . CHOLECYSTECTOMY    . TEE WITHOUT CARDIOVERSION N/A 03/11/2015   Procedure: TRANSESOPHAGEAL ECHOCARDIOGRAM (TEE);  Surgeon: [name], MD;  Location: [facility] ENDOSCOPY;  Service: Cardiovascular;  Laterality: N/A;   Social History   Social History  . Marital status: Married    Spouse name: N/A  . Number of children: N/A  . Years of education: N/A   Social History Main Topics  . Smoking status: Former Smoker    Years: 40.00    Types: Cigarettes    Quit date: 01/10/2005  . Smokeless tobacco: Never Used  . Alcohol use No  . Drug use: No  . Sexual activity: Not Asked   Other Topics Concern  . None   Social History Narrative  . None   Outpatient Encounter Prescriptions as of 07/21/2016  Medication Sig  . ACCU-CHEK FASTCLIX LANCETS MISC CHECK BLOOD GLUCOSE FOUR TIMES DAILY  . ACCU-CHEK SMARTVIEW test strip CHECK BLOOD GLUCOSE FOUR TIMES DAILY  . Alcohol Swabs (B-D SINGLE USE SWABS REGULAR) PADS USE FOUR TIMES DAILY  . ALPRAZolam (XANAX) 0.5 MG tablet Take 0.5 mg by mouth at bedtime.   Marland Kitchen apixaban (ELIQUIS) 5 MG TABS tablet Take 1 tablet (5 mg total) by mouth 2 (two) times daily.  Marland Kitchen atenolol (TENORMIN) 50 MG tablet Take 50 mg by  mouth daily.   Marland Kitchen atorvastatin (LIPITOR) 40 MG tablet Take 1 tablet (40 mg total) by mouth daily.  Marland Kitchen b complex vitamins tablet Take 1 tablet by mouth daily.  . Biotin 5000 MCG CAPS Take 1 capsule by mouth daily.  . Calcium-Phosphorus-Vitamin D (CALCIUM GUMMIES PO) Take 500-1,000 mg by mouth daily.  . chlorpheniramine (EQ CHLORTABS) 4 MG tablet Take 4 mg by mouth 2 (two) times daily as needed for allergies.  . cholecalciferol (VITAMIN D) 1000 units tablet Take 1,000 Units by mouth daily.  Marland Kitchen diltiazem (DILTIAZEM CD) 120 MG 24 hr capsule Take 1 capsule (120 mg total) by mouth daily.  . diphenhydrAMINE (BENADRYL) 25 MG tablet Take 25 mg by mouth daily.  . flecainide (TAMBOCOR) 50 MG tablet Take 1 tablet (50 mg total) by mouth 2 (two) times daily.  . furosemide (LASIX) 40 MG tablet Take 40 mg by mouth daily as needed.  . insulin aspart (NOVOLOG) 100 UNIT/ML injection Inject 10-16 Units into the skin 3 (three) times daily with meals.  . insulin detemir (LEVEMIR) 100 UNIT/ML injection Inject 0.5 mLs (50 Units total) into the skin at bedtime.  . liraglutide (VICTOZA) 18 MG/3ML SOPN Inject 0.3 mLs (1.8 mg total) into the skin daily.  Marland Kitchen lisinopril (PRINIVIL,ZESTRIL) 20 MG tablet Take 20 mg  by mouth daily.  . Misc Natural Products (ESTROVEN + ENERGY MAX STRENGTH) TABS Take 1 tablet by mouth daily.  . Multiple Vitamin (MULTIVITAMIN) tablet Take 1 tablet by mouth daily.  . Omega-3 Fatty Acids (FISH OIL) 1200 MG CAPS Take 1 capsule by mouth daily.  Marland Kitchen omeprazole (PRILOSEC) 20 MG capsule Take 1 capsule by mouth daily.  . sertraline (ZOLOFT) 100 MG tablet Take 100 mg by mouth daily.  . vitamin E (VITAMIN E) 400 UNIT capsule Take 400 Units by mouth daily.  . [DISCONTINUED] insulin detemir (LEVEMIR) 100 UNIT/ML injection Inject 0.6 mLs (60 Units total) into the skin at bedtime.   No facility-administered encounter medications on file as of 07/21/2016.    ALLERGIES: Allergies  Allergen Reactions  . Penicillins  Hives    Has patient had a PCN reaction causing immediate rash, facial/tongue/throat swelling, SOB or lightheadedness with hypotension: Yes Has patient had a PCN reaction causing severe rash involving mucus membranes or skin necrosis: No Has patient had a PCN reaction that required hospitalization No Has patient had a PCN reaction occurring within the last 10 years: No If all of the above answers are "NO", then may proceed with Cephalosporin use.    VACCINATION STATUS:  There is no immunization history on file for this patient.  Diabetes  She presents for her follow-up diabetic visit. She has type 2 diabetes mellitus. Onset time: She was diagnosed at approximate age of 51 years. Her disease course has been improving. There are no hypoglycemic associated symptoms. Pertinent negatives for hypoglycemia include no confusion, headaches, pallor or seizures. There are no diabetic associated symptoms. Pertinent negatives for diabetes include no chest pain, no polydipsia, no polyphagia and no polyuria. There are no hypoglycemic complications. Symptoms are improving. There are no diabetic complications. Risk factors for coronary artery disease include diabetes mellitus, dyslipidemia, hypertension, obesity, sedentary lifestyle and tobacco exposure. Current diabetic treatment includes intensive insulin program. She is compliant with treatment most of the time. Her weight is decreasing steadily. She is following a generally unhealthy diet. She has not had a previous visit with a dietitian (She declined referral to CDE.). Her home blood glucose trend is decreasing steadily. Her breakfast blood glucose range is generally 110-130 mg/dl. Her lunch blood glucose range is generally 140-180 mg/dl. Her dinner blood glucose range is generally 140-180 mg/dl. Her overall blood glucose range is 140-180 mg/dl. An ACE inhibitor/angiotensin II receptor blocker is being taken. She does not see a podiatrist.Eye exam is not current  (She is overdue for eye exam, I advised her to schedule a visit).  Hyperlipidemia  This is a chronic problem. The current episode started more than 1 year ago. Exacerbating diseases include diabetes and obesity. Pertinent negatives include no chest pain, leg pain, myalgias or shortness of breath. Current antihyperlipidemic treatment includes statins. Risk factors for coronary artery disease include dyslipidemia, diabetes mellitus, hypertension, a sedentary lifestyle and obesity.  Hypertension  This is a chronic problem. The current episode started more than 1 year ago. The problem is controlled. Pertinent negatives include no chest pain, headaches, palpitations or shortness of breath. Risk factors for coronary artery disease include diabetes mellitus, dyslipidemia, obesity, sedentary lifestyle and smoking/tobacco exposure. Past treatments include ACE inhibitors. The current treatment provides moderate improvement.     Review of Systems  Constitutional: Negative for unexpected weight change.  HENT: Negative for trouble swallowing and voice change.   Eyes: Negative for visual disturbance.  Respiratory: Negative for cough, shortness of breath and wheezing.  Cardiovascular: Negative for chest pain, palpitations and leg swelling.  Gastrointestinal: Negative for diarrhea, nausea and vomiting.  Endocrine: Negative for cold intolerance, heat intolerance, polydipsia, polyphagia and polyuria.  Musculoskeletal: Negative for arthralgias and myalgias.  Skin: Negative for color change, pallor, rash and wound.  Neurological: Negative for seizures and headaches.  Psychiatric/Behavioral: Negative for confusion and suicidal ideas.    Objective:    BP 111/62   Pulse 79   Ht _0  (1.6 m)   Wt 174 lb (78.9 kg)   BMI 30.82 kg/m   Wt Readings from Last 3 Encounters:  07/21/16 174 lb (78.9 kg)  07/21/16 173 lb (78.5 kg)  06/10/16 175 lb (79.4 kg)    Physical Exam  Constitutional: She is oriented to  person, place, and time. She appears well-developed.  HENT:  Head: Normocephalic and atraumatic.  Eyes: EOM are normal.  Neck: Normal range of motion. Neck supple. No tracheal deviation present. No thyromegaly present.  Cardiovascular: Normal rate and regular rhythm.   Pulmonary/Chest: Effort normal and breath sounds normal.  Abdominal: Soft. Bowel sounds are normal. There is no tenderness. There is no guarding.  Musculoskeletal: Normal range of motion. She exhibits no edema.  Neurological: She is alert and oriented to person, place, and time. She has normal reflexes. No cranial nerve deficit. Coordination normal.  Skin: Skin is warm and dry. No rash noted. No erythema. No pallor.  Psychiatric: She has a normal mood and affect. Judgment normal.    Results for orders placed or performed in visit on 07/12/16  Comprehensive metabolic panel  Result Value Ref Range   Sodium 143 135 - 146 mmol/L   Potassium 3.7 3.5 - 5.3 mmol/L   Chloride 107 98 - 110 mmol/L   CO2 26 20 - 31 mmol/L   Glucose, Bld 95 65 - 99 mg/dL   BUN 9 7 - 25 mg/dL   Creat 0.83 0.60 - 0.93 mg/dL   Total Bilirubin 0.4 0.2 - 1.2 mg/dL   Alkaline Phosphatase 60 33 - 130 U/L   AST 30 10 - 35 U/L   ALT 26 6 - 29 U/L   Total Protein 6.7 6.1 - 8.1 g/dL   Albumin 4.1 3.6 - 5.1 g/dL   Calcium 8.9 8.6 - 10.4 mg/dL  Hemoglobin A1c  Result Value Ref Range   Hgb A1c MFr Bld 6.9 (H) <5.7 %   Mean Plasma Glucose 151 mg/dL     Assessment & Plan:   1. Uncontrolled type 2 diabetes mellitus without complication, with long-term current use of insulin   - She remains at a high risk for more acute and chronic complications of diabetes which include CAD, CVA, CKD, retinopathy, and neuropathy. These are all discussed in detail with the patient.  Patient came with near target glucose profile, and  recent A1c of  6.9 %, overall improving from 14.6 %.  Glucose logs and insulin administration records pertaining to this visit,  to be  scanned into patient's records.  Recent labs reviewed.   - I have re-counseled the patient on diet management  by adopting a carbohydrate restricted / protein rich  Diet.  - Suggestion is made for patient to avoid simple carbohydrates   from her diet including Cakes , Desserts, Ice Cream,  Soda (  diet and regular) , Sweet Tea , Candies,  Chips, Cookies, Artificial Sweeteners,   and "Sugar-free" Products .  This will help patient to have stable blood glucose profile and potentially avoid unintended  Weight gain.  - Patient is advised to stick to a routine mealtimes to eat 3 meals  a day and avoid unnecessary snacks ( to snack only to correct hypoglycemia).  - The patient  declined a referral to a  CDE for individualized DM education.  - I have approached patient with the following individualized plan to manage diabetes and patient agrees.  - lower Levemir to 50 units qhs, and Novolog  10 units TIDAC , plus correction dose associated with monitoring of blood glucose before meals and at bedtime.  -Continue Victoza at 1.8 mg sq q day.  - She would have benefited from resumption of   Invokana now that she has normal renal function. However she decided to hold off on that for now. - Patient specific target  for A1c; LDL, HDL, Triglycerides, and  Waist Circumference were discussed in detail.  2) BP/HTN: controlled . Continue current medications including ACEI/ARB. 3) Lipids/HPL:  Controlled with LDL at 54, continue statins. 4)  Weight/Diet:  declined CDE consult, exercise, and carbohydrates information provided.  5) Chronic Care/Health Maintenance:  -Patient is on ACEI/ARB and Statin medications and encouraged to continue to follow up with Ophthalmology, Podiatrist at least yearly or according to recommendations, and advised to  stay away from smoking. I have recommended yearly flu vaccine and pneumonia vaccination at least every 5 years; moderate intensity exercise for up to 150 minutes weekly;  and  sleep for at least 7 hours a day.  - 25 minutes of time was spent on the care of this patient , 50% of which was applied for counseling on diabetes complications and their preventions.  - I advised patient to maintain close follow up with Lemmie Evens, MD for primary care needs.  Patient is asked to bring meter and  blood glucose logs during her next visit.   Follow up plan: -Return in about 3 months (around 10/21/2016) for meter, and logs, follow up with pre-visit labs, meter, and logs.  Glade Lloyd, MD Phone: [phone]  Fax: [phone]   07/21/2016, 10:52 AM

## 2016-07-21 NOTE — Patient Instructions (Signed)
Advice for weight management -For most of [place] the best way to lose weight is by diet management. Generally speaking, diet management means restricting carbohydrate consumption to minimum possible (and to unprocessed or minimally processed complex starch) and increasing protein intake (animal or plant source), fruits, and vegetables.  -Sticking to a routine mealtime to eat 3 meals a day and avoiding unnecessary snacks is shown to have a big role in weight control.  -It is better to avoid simple carbohydrates including: Cakes, Desserts, Ice Cream, Soda (diet and regular), Sweet Tea, Candies, Chips, Cookies, Artificial Sweeteners, and "Sugar-free" Products.   -Exercise: 30 minutes a day 3-4 days a week, or 150 minutes a week. Combine stretch, strength, and aerobic activities. You may seek evaluation by your heart doctor prior to initiating exercise if you have high risk for heart disease.  -If you are interested, we can schedule a visit with Jearld Fenton, RDN, CDE for individualized nutrition education.

## 2016-07-21 NOTE — Patient Instructions (Signed)
Your physician recommends that you schedule a follow-up appointment in: to be determined   Your physician has recommended that you wear a holter monitor. Holter monitors are medical devices that record the heart's electrical activity. Doctors most often use these monitors to diagnose arrhythmias. Arrhythmias are problems with the speed or rhythm of the heartbeat. The monitor is a small, portable device. You can wear one while you do your normal daily activities. This is usually used to diagnose what is causing palpitations/syncope (passing out).    Your physician recommends that you continue on your current medications as directed. Please refer to the Current Medication list given to you today.     If you need a refill on your cardiac medications before your next appointment, please call your pharmacy.      Thank you for choosing [facility] !

## 2016-07-25 ENCOUNTER — Ambulatory Visit (HOSPITAL_COMMUNITY)
Admission: RE | Admit: 2016-07-25 | Discharge: 2016-07-25 | Disposition: A | Discharge status: Home / Self Care | Payer: Medicare HMO | Source: Ambulatory Visit | Attending: Cardiology | Admitting: Cardiology

## 2016-07-25 DIAGNOSIS — I480 Paroxysmal atrial fibrillation: Secondary | ICD-10-CM | POA: Diagnosis present

## 2016-08-03 ENCOUNTER — Ambulatory Visit (INDEPENDENT_AMBULATORY_CARE_PROVIDER_SITE_OTHER): Payer: Medicare HMO | Admitting: Surgery

## 2016-08-03 ENCOUNTER — Encounter: Payer: Self-pay | Admitting: Surgery

## 2016-08-03 VITALS — BP 128/65 | HR 67 | Temp 98.0°F | Resp 18 | Ht 63.0 in | Wt 172.0 lb

## 2016-08-03 DIAGNOSIS — I6523 Occlusion and stenosis of bilateral carotid arteries: Secondary | ICD-10-CM

## 2016-08-03 NOTE — Progress Notes (Signed)
[facility] [place]  Patient name: Ellen Jordan MRN: [id number] DOB: [birth year] Sex: female   REQUESTING PROVIDER:    Dr. Domenic Polite   REASON FOR CONSULT:    Carotid Stenosis  HISTORY OF PRESENT ILLNESS:   Ellen Jordan is a 71 y.o. female, who is Referred today for evaluation of carotid disease.  She recently had a duplex which showed 60-79% bilateral carotid stenosis.  The patient is asymptomatic.  Specifically, she denies numbness or weakness in either extremity.  She denies slurred speech.  She denies amaurosis fugax.  Patient suffers from type 2 diabetes which is well controlled.  She is on anticoagulation for paroxysmal atrial fibrillation.  She is medically managed for hypertension with an ACE inhibitorn.  She takes a statin for hypercholesterolemia.    PAST MEDICAL HISTORY    Past Medical History:  Diagnosis Date  . Allergic rhinitis   . Anxiety   . Carpal tunnel syndrome   . Depression   . Diabetic neuropathy ([place])   . Essential hypertension   . Mixed hyperlipidemia   . PAF (paroxysmal atrial fibrillation) ([place])    a. s/p TEE cardioversion March 2017. b. Recurrence by event monitor 04/2016.  Marland Kitchen RBBB   . Restless leg syndrome   . Type 2 diabetes mellitus ([place])      FAMILY HISTORY   Family History  Problem Relation Age of Onset  . Cancer Mother   . Diabetes Brother   . COPD Father   . Hypertension Father     SOCIAL HISTORY:   Social History   Social History  . Marital status: Married    Spouse name: N/A  . Number of children: N/A  . Years of education: N/A   Occupational History  . Not on file.   Social History Main Topics  . Smoking status: Former Smoker    Years: 40.00    Types: Cigarettes    Quit date: 01/10/2005  . Smokeless tobacco: Never Used  . Alcohol use No  . Drug use: No  . Sexual activity: Not on file   Other Topics Concern  . Not on file   Social History Narrative  .  No narrative on file    ALLERGIES:    Allergies  Allergen Reactions  . Penicillins Hives    Has patient had a PCN reaction causing immediate rash, facial/tongue/throat swelling, SOB or lightheadedness with hypotension: Yes Has patient had a PCN reaction causing severe rash involving mucus membranes or skin necrosis: No Has patient had a PCN reaction that required hospitalization No Has patient had a PCN reaction occurring within the last 10 years: No If all of the above answers are "NO", then may proceed with Cephalosporin use.     CURRENT MEDICATIONS:    Current Outpatient Prescriptions  Medication Sig Dispense Refill  . ACCU-CHEK FASTCLIX LANCETS MISC CHECK BLOOD GLUCOSE FOUR TIMES DAILY 408 each 2  . ACCU-CHEK SMARTVIEW test strip CHECK BLOOD GLUCOSE FOUR TIMES DAILY 400 each 2  . Alcohol Swabs (B-D SINGLE USE SWABS REGULAR) PADS USE FOUR TIMES DAILY 400 each 2  . ALPRAZolam (XANAX) 0.5 MG tablet Take 0.5 mg by mouth at bedtime.     Marland Kitchen apixaban (ELIQUIS) 5 MG TABS tablet Take 1 tablet (5 mg total) by mouth 2 (two) times daily. 60 tablet 6  . atenolol (TENORMIN) 50 MG tablet Take 50 mg by mouth daily.     Marland Kitchen atorvastatin (LIPITOR) 40 MG tablet Take 1 tablet (40  mg total) by mouth daily. 30 tablet 3  . b complex vitamins tablet Take 1 tablet by mouth daily.    . Biotin 5000 MCG CAPS Take 1 capsule by mouth daily.    . Calcium-Phosphorus-Vitamin D (CALCIUM GUMMIES PO) Take 500-1,000 mg by mouth daily.    . chlorpheniramine (EQ CHLORTABS) 4 MG tablet Take 4 mg by mouth 2 (two) times daily as needed for allergies.    . cholecalciferol (VITAMIN D) 1000 units tablet Take 1,000 Units by mouth daily.    Marland Kitchen diltiazem (DILTIAZEM CD) 120 MG 24 hr capsule Take 1 capsule (120 mg total) by mouth daily. 30 capsule 3  . diphenhydrAMINE (BENADRYL) 25 MG tablet Take 25 mg by mouth daily.    . flecainide (TAMBOCOR) 50 MG tablet Take 1 tablet (50 mg total) by mouth 2 (two) times daily. 180 tablet 3    . furosemide (LASIX) 40 MG tablet Take 40 mg by mouth daily as needed.    . insulin aspart (NOVOLOG) 100 UNIT/ML injection Inject 10-16 Units into the skin 3 (three) times daily with meals. 50 mL 1  . insulin detemir (LEVEMIR) 100 UNIT/ML injection Inject 0.5 mLs (50 Units total) into the skin at bedtime. 60 mL 1  . liraglutide (VICTOZA) 18 MG/3ML SOPN Inject 0.3 mLs (1.8 mg total) into the skin daily. 27 mL 1  . lisinopril (PRINIVIL,ZESTRIL) 20 MG tablet Take 20 mg by mouth daily.    . Misc Natural Products (ESTROVEN + ENERGY MAX STRENGTH) TABS Take 1 tablet by mouth daily.    . Multiple Vitamin (MULTIVITAMIN) tablet Take 1 tablet by mouth daily.    . Omega-3 Fatty Acids (FISH OIL) 1200 MG CAPS Take 1 capsule by mouth daily.    Marland Kitchen omeprazole (PRILOSEC) 20 MG capsule Take 1 capsule by mouth daily.    . sertraline (ZOLOFT) 100 MG tablet Take 100 mg by mouth daily.    . vitamin E (VITAMIN E) 400 UNIT capsule Take 400 Units by mouth daily.     No current facility-administered medications for this visit.     REVIEW OF SYSTEMS:   _0  denotes positive finding, _1  denotes negative finding Cardiac  Comments:  Chest pain or chest pressure:    Shortness of breath upon exertion: x   Short of breath when lying flat:    Irregular heart rhythm: x       Vascular    Pain in calf, thigh, or hip brought on by ambulation: x   Pain in feet at night that wakes you up from your sleep:     Blood clot in your veins:    Leg swelling:         Pulmonary    Oxygen at home:    Productive cough:  x   Wheezing:  x       Neurologic    Sudden weakness in arms or legs:     Sudden numbness in arms or legs:     Sudden onset of difficulty speaking or slurred speech:    Temporary loss of vision in one eye:     Problems with dizziness:         Gastrointestinal    Blood in stool:      Vomited blood:         Genitourinary    Burning when urinating:     Blood in urine:        Psychiatric    Major  depression:  x  Hematologic    Bleeding problems:    Problems with blood clotting too easily:        Skin    Rashes or ulcers: x       Constitutional    Fever or chills:     PHYSICAL EXAM:   Vitals:   08/03/16 1113 08/03/16 1121  BP: (!) 135/54 128/65  Pulse: 67   Resp: 18   Temp: 98 F (36.7 C)   TempSrc: Oral   SpO2: 95%   Weight: 172 lb (78 kg)   Height: _0  (1.6 m)     GENERAL: The patient is a well-nourished female, in no acute distress. The vital signs are documented above. CARDIAC: There is a regular rate and rhythm.  VASCULAR: No carotid bruits.  Palpable pedal pulses. PULMONARY: Nonlabored respirations ABDOMEN: Soft and non-tender .  No pulsatile mass.  MUSCULOSKELETAL: There are no major deformities or cyanosis. NEUROLOGIC: No focal weakness or paresthesias are detected. SKIN: There are no ulcers or rashes noted. PSYCHIATRIC: The patient has a normal affect.  STUDIES:   I have reviewed the outside ultrasound which shows bilateral 50-69% stenosis  ASSESSMENT and PLAN   Asymptomatic bilateral carotid stenosis: We discussed that as long as the patient remained asymptomatic, intervention would be reserved for stenosis greater than 80%.  We went over the signs and symptoms of a TIA/stroke and what to do should they occur.  At this point, she should continue with maximal medical therapy which she is currently on with the exception of a baby aspirin.  I would like for her to start taking a 1 mg aspirin.  She is going to discuss this further with her primary care physician but will likely restart this.  I have her scheduled for follow-up with me in one year with repeat carotid duplex.   Annamarie Major, MD [facility] [phone] Pager [phone]

## 2016-08-09 ENCOUNTER — Telehealth: Payer: Self-pay | Admitting: Cardiology

## 2016-08-09 NOTE — Telephone Encounter (Signed)
Patient requesting Eliquis samples / tg

## 2016-08-09 NOTE — Telephone Encounter (Signed)
Spoke with pt, will supply her with 1 week of samples of Eliquis.

## 2016-08-24 ENCOUNTER — Other Ambulatory Visit: Payer: Self-pay | Admitting: *Deleted

## 2016-08-24 ENCOUNTER — Telehealth: Payer: Self-pay | Admitting: Cardiology

## 2016-08-24 MED ORDER — FLECAINIDE ACETATE 50 MG PO TABS: 50.0000 mg | ORAL_TABLET | Freq: Two times a day (BID) | ORAL | 3 refills | Status: DC

## 2016-08-24 MED ORDER — ATORVASTATIN CALCIUM 40 MG PO TABS: 40.0000 mg | ORAL_TABLET | Freq: Every day | ORAL | 3 refills | Status: DC

## 2016-08-24 MED ORDER — APIXABAN 5 MG PO TABS: 5.0000 mg | ORAL_TABLET | Freq: Two times a day (BID) | ORAL | 3 refills | Status: DC

## 2016-08-24 MED ORDER — DILTIAZEM HCL ER COATED BEADS 120 MG PO CP24: 120.0000 mg | ORAL_CAPSULE | Freq: Every day | ORAL | 3 refills | Status: DC

## 2016-08-24 NOTE — Telephone Encounter (Signed)
Refill sent to mail order Pharmacy

## 2016-08-24 NOTE — Telephone Encounter (Signed)
Patient states that if she is going to be staying on her medications she needs them sent to [facility] / tg

## 2016-08-31 ENCOUNTER — Other Ambulatory Visit: Payer: Self-pay

## 2016-08-31 MED ORDER — DILTIAZEM HCL ER COATED BEADS 120 MG PO CP24: 120.0000 mg | ORAL_CAPSULE | Freq: Every day | ORAL | 3 refills | Status: DC

## 2016-08-31 MED ORDER — OMEPRAZOLE 20 MG PO CPDR: 20.0000 mg | DELAYED_RELEASE_CAPSULE | Freq: Every day | ORAL | 1 refills | Status: DC

## 2016-08-31 NOTE — Telephone Encounter (Signed)
Fax request for rx refill done for cartia and omeprazole

## 2016-08-31 NOTE — Addendum Note (Signed)
Addended by: Lianne Cure A on: 08/31/2016 01:09 PM   Modules accepted: Orders

## 2016-09-08 ENCOUNTER — Other Ambulatory Visit: Payer: Self-pay

## 2016-09-08 MED ORDER — DILTIAZEM HCL ER COATED BEADS 120 MG PO CP24: 120.0000 mg | ORAL_CAPSULE | Freq: Every day | ORAL | 3 refills | Status: DC

## 2016-09-08 MED ORDER — OMEPRAZOLE 20 MG PO CPDR: 20.0000 mg | DELAYED_RELEASE_CAPSULE | Freq: Every day | ORAL | 1 refills | Status: DC

## 2016-09-13 ENCOUNTER — Other Ambulatory Visit: Payer: Self-pay

## 2016-09-13 MED ORDER — ATORVASTATIN CALCIUM 40 MG PO TABS: 40.0000 mg | ORAL_TABLET | Freq: Every day | ORAL | 3 refills | Status: DC

## 2016-09-22 ENCOUNTER — Ambulatory Visit: Payer: Medicare HMO | Admitting: Family Medicine

## 2016-10-14 LAB — RENAL FUNCTION PANEL
ALBUMIN MSPROF: 4 g/dL (ref 3.6–5.1)
BUN: 11 mg/dL (ref 7–25)
CO2: 30 mmol/L (ref 20–32)
CREATININE: 0.87 mg/dL (ref 0.60–0.93)
Calcium: 9.2 mg/dL (ref 8.6–10.4)
Chloride: 106 mmol/L (ref 98–110)
Glucose, Bld: 100 mg/dL — ABNORMAL HIGH (ref 65–99)
PHOSPHORUS: 4.4 mg/dL — AB (ref 2.1–4.3)
Potassium: 4 mmol/L (ref 3.5–5.3)
SODIUM: 143 mmol/L (ref 135–146)

## 2016-10-14 LAB — HEMOGLOBIN A1C
Hgb A1c MFr Bld: 7.3 % of total Hgb — ABNORMAL HIGH (ref <5.7)
Mean Plasma Glucose: 163 (calc)
eAG (mmol/L): 9 (calc)

## 2016-10-14 LAB — MICROALBUMIN / CREATININE URINE RATIO
Creatinine, Urine: 197 mg/dL (ref 20–275)
Microalb Creat Ratio: 8 mcg/mg creat (ref <30)
Microalb, Ur: 1.5 mg/dL

## 2016-10-21 ENCOUNTER — Encounter: Payer: Self-pay | Admitting: "Endocrinology

## 2016-10-21 ENCOUNTER — Ambulatory Visit (INDEPENDENT_AMBULATORY_CARE_PROVIDER_SITE_OTHER): Payer: Medicare HMO | Admitting: "Endocrinology

## 2016-10-21 VITALS — BP 156/72 | HR 78 | Ht 63.0 in | Wt 171.0 lb

## 2016-10-21 DIAGNOSIS — E1165 Type 2 diabetes mellitus with hyperglycemia: Secondary | ICD-10-CM

## 2016-10-21 DIAGNOSIS — I10 Essential (primary) hypertension: Secondary | ICD-10-CM | POA: Diagnosis not present

## 2016-10-21 DIAGNOSIS — Z794 Long term (current) use of insulin: Secondary | ICD-10-CM | POA: Diagnosis not present

## 2016-10-21 DIAGNOSIS — IMO0001 Reserved for inherently not codable concepts without codable children: Secondary | ICD-10-CM

## 2016-10-21 DIAGNOSIS — E782 Mixed hyperlipidemia: Secondary | ICD-10-CM | POA: Diagnosis not present

## 2016-10-21 MED ORDER — FREESTYLE LIBRE READER DEVI: 1.0000 | Freq: Once | 0 refills | Status: AC

## 2016-10-21 MED ORDER — FREESTYLE LIBRE SENSOR SYSTEM MISC: 2 refills | Status: DC

## 2016-10-21 NOTE — Patient Instructions (Signed)
Advice for Weight Management -For most of [place] the best way to lose weight is by diet management. Generally speaking, diet management means restricting carbohydrate consumption to minimum possible (and to unprocessed or minimally processed complex starch) and increasing protein intake (animal or plant source), fruits, and vegetables.  -Sticking to a routine mealtime to eat 3 meals a day and avoiding unnecessary snacks is shown to have a big role in weight control.  -It is better to avoid simple carbohydrates including: Cakes, Sweet Desserts, Ice Cream, Soda (diet and regular), Sweet Tea, Candies, Chips, Cookies, Store Bought Juices, Alcohol in Excess of  1-2 drinks a day, Artificial Sweeteners, and "Sugar-free" Products.   -Exercise: 30 -60 minutes a day 3-4 days a week, or 150 minutes a week. Combine stretch, strength, and aerobic activities. You may seek evaluation by your heart doctor prior to initiating exercise if you have high risk for heart disease.  -If you are interested in discussing diet options/exchanges , we  can schedule a visit with Jearld Fenton, RDN, CDE for individualized nutrition education.

## 2016-10-21 NOTE — Progress Notes (Signed)
Subjective:    Patient ID: Ellen Jordan, female    DOB: November 28, 1945, PCP Lemmie Evens, MD   Past Medical History:  Diagnosis Date  . Allergic rhinitis   . Anxiety   . Carpal tunnel syndrome   . Depression   . Diabetic neuropathy ([place])   . Essential hypertension   . Mixed hyperlipidemia   . PAF (paroxysmal atrial fibrillation) ([place])    a. s/p TEE cardioversion March 2017. b. Recurrence by event monitor 04/2016.  Marland Kitchen RBBB   . Restless leg syndrome   . Type 2 diabetes mellitus ([place])    Past Surgical History:  Procedure Laterality Date  . ABDOMINAL HYSTERECTOMY    . BACK SURGERY    . CARDIOVERSION N/A 03/11/2015   Procedure: CARDIOVERSION;  Surgeon: [name], MD;  Location: [facility];  Service: Cardiovascular;  Laterality: N/A;  . CHOLECYSTECTOMY    . TEE WITHOUT CARDIOVERSION N/A 03/11/2015   Procedure: TRANSESOPHAGEAL ECHOCARDIOGRAM (TEE);  Surgeon: [name], MD;  Location: [facility] ENDOSCOPY;  Service: Cardiovascular;  Laterality: N/A;   Social History   Social History  . Marital status: Married    Spouse name: N/A  . Number of children: N/A  . Years of education: N/A   Social History Main Topics  . Smoking status: Former Smoker    Years: 40.00    Types: Cigarettes    Quit date: 01/10/2005  . Smokeless tobacco: Never Used  . Alcohol use No  . Drug use: No  . Sexual activity: Not Asked   Other Topics Concern  . None   Social History Narrative  . None   Outpatient Encounter Prescriptions as of 10/21/2016  Medication Sig  . ACCU-CHEK FASTCLIX LANCETS MISC CHECK BLOOD GLUCOSE FOUR TIMES DAILY  . ACCU-CHEK SMARTVIEW test strip CHECK BLOOD GLUCOSE FOUR TIMES DAILY  . Alcohol Swabs (B-D SINGLE USE SWABS REGULAR) PADS USE FOUR TIMES DAILY  . ALPRAZolam (XANAX) 0.5 MG tablet Take 0.5 mg by mouth at bedtime.   Marland Kitchen apixaban (ELIQUIS) 5 MG TABS tablet Take 1 tablet (5 mg total) by mouth 2 (two) times daily.  Marland Kitchen atenolol (TENORMIN) 50 MG tablet Take 50 mg by  mouth daily.   Marland Kitchen atorvastatin (LIPITOR) 40 MG tablet Take 1 tablet (40 mg total) by mouth daily.  Marland Kitchen b complex vitamins tablet Take 1 tablet by mouth daily.  . Biotin 5000 MCG CAPS Take 1 capsule by mouth daily.  . Calcium-Phosphorus-Vitamin D (CALCIUM GUMMIES PO) Take 500-1,000 mg by mouth daily.  . chlorpheniramine (EQ CHLORTABS) 4 MG tablet Take 4 mg by mouth 2 (two) times daily as needed for allergies.  . cholecalciferol (VITAMIN D) 1000 units tablet Take 1,000 Units by mouth daily.  . Continuous Blood Gluc Receiver (FREESTYLE LIBRE READER) DEVI 1 Piece by Does not apply route once.  . Continuous Blood Gluc Sensor (FREESTYLE LIBRE SENSOR SYSTEM) MISC Use one sensor every 10 days.  Marland Kitchen diltiazem (DILTIAZEM CD) 120 MG 24 hr capsule Take 1 capsule (120 mg total) by mouth daily.  . diphenhydrAMINE (BENADRYL) 25 MG tablet Take 25 mg by mouth daily.  . flecainide (TAMBOCOR) 50 MG tablet Take 1 tablet (50 mg total) by mouth 2 (two) times daily.  . furosemide (LASIX) 40 MG tablet Take 40 mg by mouth daily as needed.  . insulin aspart (NOVOLOG) 100 UNIT/ML injection Inject 10-16 Units into the skin 3 (three) times daily with meals.  . insulin detemir (LEVEMIR) 100 UNIT/ML injection Inject 0.5 mLs (  50 Units total) into the skin at bedtime.  . liraglutide (VICTOZA) 18 MG/3ML SOPN Inject 0.3 mLs (1.8 mg total) into the skin daily.  Marland Kitchen lisinopril (PRINIVIL,ZESTRIL) 20 MG tablet Take 20 mg by mouth daily.  . Misc Natural Products (ESTROVEN + ENERGY MAX STRENGTH) TABS Take 1 tablet by mouth daily.  . Multiple Vitamin (MULTIVITAMIN) tablet Take 1 tablet by mouth daily.  . Omega-3 Fatty Acids (FISH OIL) 1200 MG CAPS Take 1 capsule by mouth daily.  Marland Kitchen omeprazole (PRILOSEC) 20 MG capsule Take 1 capsule (20 mg total) by mouth daily.  . sertraline (ZOLOFT) 100 MG tablet Take 100 mg by mouth daily.  . vitamin E (VITAMIN E) 400 UNIT capsule Take 400 Units by mouth daily.   No facility-administered encounter  medications on file as of 10/21/2016.    ALLERGIES: Allergies  Allergen Reactions  . Penicillins Hives    Has patient had a PCN reaction causing immediate rash, facial/tongue/throat swelling, SOB or lightheadedness with hypotension: Yes Has patient had a PCN reaction causing severe rash involving mucus membranes or skin necrosis: No Has patient had a PCN reaction that required hospitalization No Has patient had a PCN reaction occurring within the last 10 years: No If all of the above answers are "NO", then may proceed with Cephalosporin use.    VACCINATION STATUS:  There is no immunization history on file for this patient.  Diabetes  She presents for her follow-up diabetic visit. She has type 2 diabetes mellitus. Onset time: She was diagnosed at approximate age of 71 years. Her disease course has been stable. There are no hypoglycemic associated symptoms. Pertinent negatives for hypoglycemia include no confusion, headaches, pallor or seizures. There are no diabetic associated symptoms. Pertinent negatives for diabetes include no chest pain, no polydipsia, no polyphagia and no polyuria. There are no hypoglycemic complications. Symptoms are stable. There are no diabetic complications. Risk factors for coronary artery disease include diabetes mellitus, dyslipidemia, hypertension, obesity, sedentary lifestyle and tobacco exposure. Current diabetic treatment includes intensive insulin program. She is compliant with treatment most of the time. Her weight is decreasing steadily. She is following a generally unhealthy diet. She has not had a previous visit with a dietitian (She declined referral to CDE.). Her home blood glucose trend is decreasing steadily. Her breakfast blood glucose range is generally 140-180 mg/dl. Her lunch blood glucose range is generally 140-180 mg/dl. Her dinner blood glucose range is generally 140-180 mg/dl. Her overall blood glucose range is 140-180 mg/dl. An ACE  inhibitor/angiotensin II receptor blocker is being taken. She does not see a podiatrist.Eye exam is not current (She is overdue for eye exam, I advised her to schedule a visit).  Hyperlipidemia  This is a chronic problem. The current episode started more than 1 year ago. Exacerbating diseases include diabetes and obesity. Pertinent negatives include no chest pain, leg pain, myalgias or shortness of breath. Current antihyperlipidemic treatment includes statins. Risk factors for coronary artery disease include dyslipidemia, diabetes mellitus, hypertension, a sedentary lifestyle and obesity.  Hypertension  This is a chronic problem. The current episode started more than 1 year ago. The problem is controlled. Pertinent negatives include no chest pain, headaches, palpitations or shortness of breath. Risk factors for coronary artery disease include diabetes mellitus, dyslipidemia, obesity, sedentary lifestyle and smoking/tobacco exposure. Past treatments include ACE inhibitors. The current treatment provides moderate improvement.     Review of Systems  Constitutional: Negative for unexpected weight change.  HENT: Negative for trouble swallowing and voice change.  Eyes: Negative for visual disturbance.  Respiratory: Negative for cough, shortness of breath and wheezing.   Cardiovascular: Negative for chest pain, palpitations and leg swelling.  Gastrointestinal: Negative for diarrhea, nausea and vomiting.  Endocrine: Negative for cold intolerance, heat intolerance, polydipsia, polyphagia and polyuria.  Musculoskeletal: Negative for arthralgias and myalgias.  Skin: Negative for color change, pallor, rash and wound.  Neurological: Negative for seizures and headaches.  Psychiatric/Behavioral: Negative for confusion and suicidal ideas.    Objective:    BP (!) 156/72   Pulse 78   Ht _0  (1.6 m)   Wt 171 lb (77.6 kg)   BMI 30.29 kg/m   Wt Readings from Last 3 Encounters:  10/21/16 171 lb (77.6 kg)   08/03/16 172 lb (78 kg)  07/21/16 174 lb (78.9 kg)    Physical Exam  Constitutional: She is oriented to person, place, and time. She appears well-developed.  HENT:  Head: Normocephalic and atraumatic.  Eyes: EOM are normal.  Neck: Normal range of motion. Neck supple. No tracheal deviation present. No thyromegaly present.  Cardiovascular: Normal rate and regular rhythm.   Pulmonary/Chest: Effort normal and breath sounds normal.  Abdominal: Soft. Bowel sounds are normal. There is no tenderness. There is no guarding.  Musculoskeletal: Normal range of motion. She exhibits no edema.  Neurological: She is alert and oriented to person, place, and time. She has normal reflexes. No cranial nerve deficit. Coordination normal.  Skin: Skin is warm and dry. No rash noted. No erythema. No pallor.  Psychiatric: She has a normal mood and affect. Judgment normal.    Results for orders placed or performed in visit on 07/21/16  Renal function panel  Result Value Ref Range   Glucose, Bld 100 (H) 65 - 99 mg/dL   BUN 11 7 - 25 mg/dL   Creat 0.87 0.60 - 0.93 mg/dL   BUN/Creatinine Ratio NOT APPLICABLE 6 - 22 (calc)   Sodium 143 135 - 146 mmol/L   Potassium 4.0 3.5 - 5.3 mmol/L   Chloride 106 98 - 110 mmol/L   CO2 30 20 - 32 mmol/L   Calcium 9.2 8.6 - 10.4 mg/dL   Phosphorus 4.4 (H) 2.1 - 4.3 mg/dL   Albumin 4.0 3.6 - 5.1 g/dL  Hemoglobin A1c  Result Value Ref Range   Hgb A1c MFr Bld 7.3 (H) <5.7 % of total Hgb   Mean Plasma Glucose 163 (calc)   eAG (mmol/L) 9.0 (calc)  Microalbumin / creatinine urine ratio  Result Value Ref Range   Creatinine, Urine 197 20 - 275 mg/dL   Microalb, Ur 1.5 mg/dL   Microalb Creat Ratio 8 <30 mcg/mg creat     Assessment & Plan:   1. Uncontrolled type 2 diabetes mellitus without complication, with long-term current use of insulin   - She remains at a high risk for more acute and chronic complications of diabetes which include CAD, CVA, CKD, retinopathy, and  neuropathy. These are all discussed in detail with the patient.  Patient came with near target glucose profile, and  recent A1c  Stable at 7.3%, overall improving from 14.6 %.  Glucose logs and insulin administration records pertaining to this visit,  to be scanned into patient's records.  Recent labs reviewed.   - I have re-counseled the patient on diet management  by adopting a carbohydrate restricted / protein rich  Diet.  -  Suggestion is made for her to avoid simple carbohydrates  from her diet including Cakes, Sweet Desserts / Pastries,  Ice Cream, Soda (diet and regular), Sweet Tea, Candies, Chips, Cookies, Store Bought Juices, Alcohol in Excess of  1-2 drinks a day, Artificial Sweeteners, and "Sugar-free" Products. This will help patient to have stable blood glucose profile and potentially avoid unintended weight gain.   - Patient is advised to stick to a routine mealtimes to eat 3 meals  a day and avoid unnecessary snacks ( to snack only to correct hypoglycemia).  - The patient  declined a referral to a  CDE for individualized DM education.  - I have approached patient with the following individualized plan to manage diabetes and patient agrees.  - I advised her to continue Levemir  50 units daily at bedtime , continue  Novolog  10 units   3 times a day before meals  , plus correction dose associated with monitoring of blood glucose 4 times a day-before meals and at bedtime.  -Continue Victoza at 1.8 mg subcutaneously daily .  - She will benefit from continuous glucose monitoring. I discussed and initiated a prescription for the [facility] device for her.  - Patient specific target  for A1c; LDL, HDL, Triglycerides, and  Waist Circumference were discussed in detail.  2) BP/HTN:  Uncontrolled . Continue current medications including ACEI/ARB. 3) Lipids/HPL:  Controlled with LDL at 54, continue statins. 4)  Weight/Diet:  declined CDE consult, exercise, and carbohydrates information  provided.  5) Chronic Care/Health Maintenance:  -Patient is on ACEI/ARB and Statin medications and encouraged to continue to follow up with Ophthalmology, Podiatrist at least yearly or according to recommendations, and advised to  stay away from smoking. I have recommended yearly flu vaccine and pneumonia vaccination at least every 5 years; moderate intensity exercise for up to 150 minutes weekly; and  sleep for at least 7 hours a day.  - Time spent with the patient: 25 min, of which >50% was spent in reviewing her sugar logs , discussing her hypo- and hyper-glycemic episodes, reviewing her current and  previous labs and insulin doses and developing a plan to avoid hypo- and hyper-glycemia.    - I advised patient to maintain close follow up with Lemmie Evens, MD for primary care needs.  Follow up plan: -Return in about 4 months (around 02/21/2017).  Glade Lloyd, MD Phone: [phone]  Fax: [phone]   -  This note was partially dictated with voice recognition software. Similar sounding words can be transcribed inadequately or may not  be corrected upon review.  10/21/2016, 11:42 AM

## 2016-11-04 ENCOUNTER — Ambulatory Visit (INDEPENDENT_AMBULATORY_CARE_PROVIDER_SITE_OTHER): Payer: Medicare HMO | Admitting: Family Medicine

## 2016-11-04 ENCOUNTER — Encounter: Payer: Self-pay | Admitting: Family Medicine

## 2016-11-04 VITALS — BP 136/58 | HR 72 | Temp 97.1°F | Resp 18 | Ht 63.0 in | Wt 174.0 lb

## 2016-11-04 DIAGNOSIS — E118 Type 2 diabetes mellitus with unspecified complications: Secondary | ICD-10-CM

## 2016-11-04 DIAGNOSIS — G2581 Restless legs syndrome: Secondary | ICD-10-CM | POA: Diagnosis not present

## 2016-11-04 DIAGNOSIS — Z794 Long term (current) use of insulin: Secondary | ICD-10-CM | POA: Diagnosis not present

## 2016-11-04 DIAGNOSIS — Z23 Encounter for immunization: Secondary | ICD-10-CM

## 2016-11-04 MED ORDER — PRAMIPEXOLE DIHYDROCHLORIDE 0.5 MG PO TABS: ORAL_TABLET | ORAL | 5 refills | Status: DC

## 2016-11-04 NOTE — Patient Instructions (Addendum)
Need old records Dr Karie Kirks  Stop the xanax Take the mirapex before bed (1-2 hours)  See me in one month for a PE  Walk every day that you are able

## 2016-11-04 NOTE — Progress Notes (Signed)
Chief Complaint  Patient presents with  . Hypertension   This is a new patient to establish with the office She is under the care of Dr. Laverta Baltimore for well-controlled diabetes mellitus.  She is insulin-dependent, her most recent A1c is 7.3. She states she does not have any diabetes complications as eye disease neuropathy, or kidney impairment. Patient is not current with her eye exam is reminded to go yearly She is under the care of cardiology for paroxysmal atrial fibrillation.  She is on long-term anticoagulation.  She is compliant with her medications. She is on an ACE inhibitor for hypertension.  Blood pressure well controlled She is on a statin for hyperlipidemia.  Her LDL is under 70 That she does not get any regular exercise.  She states she gets short of breath.  No wheezing or coughing.  No diagnosis of asthma or COPD.  She was previously a smoker.  I discussed with her that she is likely out of shape and needs gradual physical conditioning in order to improve her exercise tolerance. She has environmental allergies.  Postnasal drip.  She takes chlorpheniramine during the day and Benadryl at night.  I suggested a long-acting nondrowsy antihistamine such as Claritin or Zyrtec.  She states these "do not work".  I also suggested she should try Flonase or Nasacort over-the-counter nasal steroid She has never had a mammogram.  She does not want a mammogram.  She does not want to discuss it.  She is never had colon cancer screening.  She does not want a colon home test such as cologuard. She is up-to-date with her immunizations.  She had a hysterectomy for bleeding.  She also had an abnormal Pap smear.  She states she did not have cervical cancer.  She does not have ongoing GYN examinations her care. She takes Xanax at night.  She states that her restless leg syndrome.  I informed her that I will not prescribe this for her on an ongoing basis.  I will try other medication for her restless legs and  gave her a prescription for Mirapex with instructions.  Patient Active Problem List   Diagnosis Date Noted  . Controlled diabetes mellitus type 2 with complications ([place]) 20/35/5974  . Restless leg syndrome 11/04/2016  . PAF (paroxysmal atrial fibrillation) ([place]) 06/03/2016  . RBBB 06/03/2016  . Atrial fibrillation with rapid ventricular response ([place]) 02/28/2015  . Uncontrolled type 2 diabetes mellitus without complication, with long-term current use of insulin ([place]) 12/24/2014  . Mixed hyperlipidemia 12/24/2014  . Essential hypertension, benign 12/24/2014    Outpatient Encounter Prescriptions as of 11/04/2016  Medication Sig  . ACCU-CHEK FASTCLIX LANCETS MISC CHECK BLOOD GLUCOSE FOUR TIMES DAILY  . ACCU-CHEK SMARTVIEW test strip CHECK BLOOD GLUCOSE FOUR TIMES DAILY  . Alcohol Swabs (B-D SINGLE USE SWABS REGULAR) PADS USE FOUR TIMES DAILY  . apixaban (ELIQUIS) 5 MG TABS tablet Take 1 tablet (5 mg total) by mouth 2 (two) times daily.  Marland Kitchen atenolol (TENORMIN) 50 MG tablet Take 50 mg by mouth daily.   Marland Kitchen atorvastatin (LIPITOR) 40 MG tablet Take 1 tablet (40 mg total) by mouth daily.  Marland Kitchen b complex vitamins tablet Take 1 tablet by mouth daily.  . Biotin 5000 MCG CAPS Take 1 capsule by mouth daily.  . Calcium-Phosphorus-Vitamin D (CALCIUM GUMMIES PO) Take 500-1,000 mg by mouth daily.  . chlorpheniramine (EQ CHLORTABS) 4 MG tablet Take 4 mg by mouth 2 (two) times daily as needed for allergies.  . cholecalciferol (VITAMIN  D) 1000 units tablet Take 1,000 Units by mouth daily.  . Continuous Blood Gluc Sensor (FREESTYLE LIBRE SENSOR SYSTEM) MISC Use one sensor every 10 days.  Marland Kitchen diltiazem (DILTIAZEM CD) 120 MG 24 hr capsule Take 1 capsule (120 mg total) by mouth daily.  . diphenhydrAMINE (BENADRYL) 25 MG tablet Take 25 mg by mouth daily.  . flecainide (TAMBOCOR) 50 MG tablet Take 1 tablet (50 mg total) by mouth 2 (two) times daily.  . furosemide (LASIX) 40 MG tablet Take 40 mg by mouth daily as  needed.  . insulin aspart (NOVOLOG) 100 UNIT/ML injection Inject 10-16 Units into the skin 3 (three) times daily with meals.  . insulin detemir (LEVEMIR) 100 UNIT/ML injection Inject 0.5 mLs (50 Units total) into the skin at bedtime.  . liraglutide (VICTOZA) 18 MG/3ML SOPN Inject 0.3 mLs (1.8 mg total) into the skin daily.  Marland Kitchen lisinopril (PRINIVIL,ZESTRIL) 20 MG tablet Take 20 mg by mouth daily.  . Misc Natural Products (ESTROVEN + ENERGY MAX STRENGTH) TABS Take 1 tablet by mouth daily.  . Multiple Vitamin (MULTIVITAMIN) tablet Take 1 tablet by mouth daily.  . Omega-3 Fatty Acids (FISH OIL) 1200 MG CAPS Take 1 capsule by mouth daily.  Marland Kitchen omeprazole (PRILOSEC) 20 MG capsule Take 1 capsule (20 mg total) by mouth daily.  . sertraline (ZOLOFT) 100 MG tablet Take 100 mg by mouth daily.  . vitamin E (VITAMIN E) 400 UNIT capsule Take 400 Units by mouth daily.  . [DISCONTINUED] ALPRAZolam (XANAX) 0.5 MG tablet Take 0.5 mg by mouth at bedtime.   . pramipexole (MIRAPEX) 0.5 MG tablet Take 1-2 hours before bed for restless legs   No facility-administered encounter medications on file as of 11/04/2016.     Past Medical History:  Diagnosis Date  . Allergic rhinitis   . Allergy   . Anxiety   . Cancer ([place])   . Carpal tunnel syndrome   . Cataract   . Depression   . Diabetic neuropathy ([place])   . Essential hypertension   . GERD (gastroesophageal reflux disease)   . Mixed hyperlipidemia   . PAF (paroxysmal atrial fibrillation) ([place])    a. s/p TEE cardioversion March 2017. b. Recurrence by event monitor 04/2016.  Marland Kitchen RBBB   . Restless leg syndrome   . Type 2 diabetes mellitus ([place])     Past Surgical History:  Procedure Laterality Date  . ABDOMINAL HYSTERECTOMY    . BACK SURGERY    . CARDIOVERSION N/A 03/11/2015   Procedure: CARDIOVERSION;  Surgeon: [name], MD;  Location: [facility];  Service: Cardiovascular;  Laterality: N/A;  . CHOLECYSTECTOMY    . EYE SURGERY     cataracts  . SPINE  SURGERY    . TEE WITHOUT CARDIOVERSION N/A 03/11/2015   Procedure: TRANSESOPHAGEAL ECHOCARDIOGRAM (TEE);  Surgeon: [name], MD;  Location[facility] ENDOSCOPY;  Service: Cardiovascular;  Laterality: N/A;    Social History   Social History  . Marital status: Married    Spouse name: earl  . Number of children: 2  . Years of education: 13   Occupational History  . retired    Social History Main Topics  . Smoking status: Former Smoker    Years: 40.00    Types: Cigarettes    Start date: 01/10/1961    Quit date: 01/10/2005  . Smokeless tobacco: Never Used  . Alcohol use No  . Drug use: No  . Sexual activity: Not Currently   Other Topics Concern  . Not  on file   Social History Narrative   Lives with husband Toby (earl)   Likes to read    Family History  Problem Relation Age of Onset  . Heart disease Mother 51  . Hyperlipidemia Mother   . Diabetes Brother   . COPD Father 57       emphysema  . Hypertension Father   . Alcohol abuse Father   . Hyperlipidemia Father   . Diabetes Daughter   . Diabetes Brother   . Diabetes Brother   . Diabetes Brother     Review of Systems  Constitutional: Negative for chills, fever and weight loss.  HENT: Negative for congestion and hearing loss.   Eyes: Negative for blurred vision and pain.  Respiratory: Positive for shortness of breath. Negative for cough.   Cardiovascular: Negative for chest pain and leg swelling.  Gastrointestinal: Negative for abdominal pain, constipation, diarrhea and heartburn.  Genitourinary: Negative for dysuria and frequency.  Musculoskeletal: Positive for back pain. Negative for falls, joint pain and myalgias.  Neurological: Negative for dizziness, seizures and headaches.  Psychiatric/Behavioral: Negative for depression. The patient is not nervous/anxious and does not have insomnia.     BP (!) 136/58 (BP Location: Right Arm, Patient Position: Sitting, Cuff Size: Normal)   Pulse 72   Temp (!) 97.1 F (36.2  C) (Temporal)   Resp 18   Ht _0  (1.6 m)   Wt 174 lb 0.6 oz (78.9 kg)   SpO2 91%   BMI 30.83 kg/m   Physical Exam  Constitutional: She is oriented to person, place, and time. She appears well-developed and well-nourished. No distress.  Central obesity  HENT:  Head: Normocephalic and atraumatic.  Mouth/Throat: Oropharynx is clear and moist.  Eyes: Pupils are equal, round, and reactive to light. Conjunctivae are normal.  Neck: Normal range of motion.  Cardiovascular: Normal rate, regular rhythm and normal heart sounds.   Pulmonary/Chest: Effort normal and breath sounds normal. No respiratory distress. She has no wheezes. She has no rales.  Musculoskeletal: Normal range of motion. She exhibits no edema.  Lymphadenopathy:    She has no cervical adenopathy.  Neurological: She is alert and oriented to person, place, and time.  Skin: Skin is warm.  Sun damaged skin  Psychiatric: She has a normal mood and affect. Her behavior is normal. Thought content normal.  ASSESSMENT/PLAN:  1. Controlled type 2 diabetes mellitus with complication, with long-term current use of insulin ([place])  2. Restless leg syndrome  3. Needs flu shot - Flu Vaccine QUAD 36+ mos IM 4. hypertension 5.hyperlipidemia   Patient Instructions  Need old records Dr Karie Kirks  Stop the xanax Take the mirapex before bed (1-2 hours)  See me in one month for a PE  Walk every day that you are able     Raylene Everts, MD

## 2016-11-07 ENCOUNTER — Other Ambulatory Visit: Payer: Self-pay

## 2016-11-09 ENCOUNTER — Encounter: Payer: Self-pay | Admitting: Family Medicine

## 2016-11-09 DIAGNOSIS — Z87891 Personal history of nicotine dependence: Secondary | ICD-10-CM | POA: Insufficient documentation

## 2016-11-30 ENCOUNTER — Encounter: Payer: Self-pay | Admitting: Family Medicine

## 2016-11-30 ENCOUNTER — Ambulatory Visit (INDEPENDENT_AMBULATORY_CARE_PROVIDER_SITE_OTHER): Payer: Medicare HMO | Admitting: Family Medicine

## 2016-11-30 ENCOUNTER — Other Ambulatory Visit: Payer: Self-pay

## 2016-11-30 VITALS — BP 126/56 | HR 84 | Temp 97.6°F | Resp 18 | Ht 63.0 in | Wt 172.0 lb

## 2016-11-30 DIAGNOSIS — Z23 Encounter for immunization: Secondary | ICD-10-CM | POA: Diagnosis not present

## 2016-11-30 DIAGNOSIS — Z794 Long term (current) use of insulin: Secondary | ICD-10-CM

## 2016-11-30 DIAGNOSIS — E118 Type 2 diabetes mellitus with unspecified complications: Secondary | ICD-10-CM | POA: Diagnosis not present

## 2016-11-30 DIAGNOSIS — Z0000 Encounter for general adult medical examination without abnormal findings: Secondary | ICD-10-CM

## 2016-11-30 NOTE — Patient Instructions (Signed)
Continue current medicines See me in six months Need lab tests prior to next visit Call sooner for problems Walk/exercise every day that you are able

## 2016-11-30 NOTE — Progress Notes (Signed)
Chief Complaint  Patient presents with  . Annual Exam   Patient is here for a physical examination. She states she has had a cold for almost a week with runny stuffy nose and cough.  No fever or chills.  No purulent sputum. She is trying to eat well.  She still does not exercise. No new complaints. She is not up to date with health screenings, refuses breast exam or mammogram.  Refuses colonoscopies.  She did get a flu shot. She acknowledges that she is overdue for her eye exam. Foot exam is done today.   Patient Active Problem List   Diagnosis Date Noted  . History of tobacco abuse 11/09/2016  . Controlled diabetes mellitus type 2 with complications ([place]) 40/97/9641  . Restless leg syndrome 11/04/2016  . PAF (paroxysmal atrial fibrillation) ([place]) 06/03/2016  . RBBB 06/03/2016  . Atrial fibrillation with rapid ventricular response ([place]) 02/28/2015  . Uncontrolled type 2 diabetes mellitus without complication, with long-term current use of insulin ([place]) 12/24/2014  . Mixed hyperlipidemia 12/24/2014  . Essential hypertension, benign 12/24/2014    Outpatient Encounter Medications as of 11/30/2016  Medication Sig  . ACCU-CHEK FASTCLIX LANCETS MISC CHECK BLOOD GLUCOSE FOUR TIMES DAILY  . ACCU-CHEK SMARTVIEW test strip CHECK BLOOD GLUCOSE FOUR TIMES DAILY  . Alcohol Swabs (B-D SINGLE USE SWABS REGULAR) PADS USE FOUR TIMES DAILY  . apixaban (ELIQUIS) 5 MG TABS tablet Take 1 tablet (5 mg total) by mouth 2 (two) times daily.  Marland Kitchen atenolol (TENORMIN) 50 MG tablet Take 50 mg by mouth daily.   Marland Kitchen atorvastatin (LIPITOR) 40 MG tablet Take 1 tablet (40 mg total) by mouth daily.  Marland Kitchen b complex vitamins tablet Take 1 tablet by mouth daily.  . Biotin 5000 MCG CAPS Take 1 capsule by mouth daily.  . Calcium-Phosphorus-Vitamin D (CALCIUM GUMMIES PO) Take 500-1,000 mg by mouth daily.  . chlorpheniramine (EQ CHLORTABS) 4 MG tablet Take 4 mg by mouth 2 (two) times daily as needed for allergies.  .  cholecalciferol (VITAMIN D) 1000 units tablet Take 1,000 Units by mouth daily.  . Continuous Blood Gluc Sensor (FREESTYLE LIBRE SENSOR SYSTEM) MISC Use one sensor every 10 days.  Marland Kitchen diltiazem (DILTIAZEM CD) 120 MG 24 hr capsule Take 1 capsule (120 mg total) by mouth daily.  . diphenhydrAMINE (BENADRYL) 25 MG tablet Take 25 mg by mouth daily.  . flecainide (TAMBOCOR) 50 MG tablet Take 1 tablet (50 mg total) by mouth 2 (two) times daily.  . furosemide (LASIX) 40 MG tablet Take 40 mg by mouth daily as needed.  . insulin aspart (NOVOLOG) 100 UNIT/ML injection Inject 10-16 Units into the skin 3 (three) times daily with meals.  . insulin detemir (LEVEMIR) 100 UNIT/ML injection Inject 0.5 mLs (50 Units total) into the skin at bedtime.  . liraglutide (VICTOZA) 18 MG/3ML SOPN Inject 0.3 mLs (1.8 mg total) into the skin daily.  Marland Kitchen lisinopril (PRINIVIL,ZESTRIL) 20 MG tablet Take 20 mg by mouth daily.  . Misc Natural Products (ESTROVEN + ENERGY MAX STRENGTH) TABS Take 1 tablet by mouth daily.  . Multiple Vitamin (MULTIVITAMIN) tablet Take 1 tablet by mouth daily.  . Omega-3 Fatty Acids (FISH OIL) 1200 MG CAPS Take 1 capsule by mouth daily.  Marland Kitchen omeprazole (PRILOSEC) 20 MG capsule Take 1 capsule (20 mg total) by mouth daily.  . sertraline (ZOLOFT) 100 MG tablet Take 100 mg by mouth daily.  . vitamin E (VITAMIN E) 400 UNIT capsule Take 400 Units by mouth daily.  No facility-administered encounter medications on file as of 11/30/2016.     Allergies  Allergen Reactions  . Penicillins Hives    Has patient had a PCN reaction causing immediate rash, facial/tongue/throat swelling, SOB or lightheadedness with hypotension: Yes Has patient had a PCN reaction causing severe rash involving mucus membranes or skin necrosis: No Has patient had a PCN reaction that required hospitalization No Has patient had a PCN reaction occurring within the last 10 years: No If all of the above answers are "NO", then may proceed with  Cephalosporin use.     Review of Systems  Constitutional: Negative for activity change, appetite change and unexpected weight change.  HENT: Negative for congestion, dental problem, postnasal drip and rhinorrhea.   Eyes: Negative for redness and visual disturbance.  Respiratory: Positive for shortness of breath. Negative for cough.   Cardiovascular: Negative for chest pain, palpitations and leg swelling.  Gastrointestinal: Negative for abdominal pain, constipation and diarrhea.  Genitourinary: Negative for difficulty urinating, frequency and vaginal bleeding.  Musculoskeletal: Negative for arthralgias and back pain.  Neurological: Positive for numbness. Negative for dizziness and headaches.  Psychiatric/Behavioral: Positive for sleep disturbance. Negative for dysphoric mood. The patient is not nervous/anxious.     BP (!) 126/56 (BP Location: Right Arm, Patient Position: Sitting, Cuff Size: Normal)   Pulse 84   Temp 97.6 F (36.4 C) (Temporal)   Resp 18   Ht 5' 3" (1.6 m)   Wt 172 lb 0.6 oz (78 kg)   SpO2 95%   BMI 30.48 kg/m   Physical Exam   General Appearance:    Alert, cooperative, no distress, appears stated age  Head:    Normocephalic, without obvious abnormality, atraumatic  Eyes:    PERRL, conjunctiva/corneas clear, EOM's intact, fundi    benign, both eyes  Ears:    Normal TM's and external ear canals, both ears  Nose:   Nares normal, septum midline, mucosa normal, no drainage    or sinus tenderness  Throat:   Lips, mucosa, and tongue normal; edentulous, gums normal  Neck:   Supple, symmetrical, trachea midline, no adenopathy;    thyroid:  no enlargement/tenderness/nodules; no carotid   bruit   Back:     Symmetric, no curvature, ROM normal, no CVA tenderness.  Mild thoracic kyphosis  Lungs:     Clear to auscultation bilaterally, respirations unlabored  Chest Wall:    No tenderness or deformity   Heart:    Regular rate and rhythm, S1 and S2 normal, no murmur, rub    or gallop  Breast Exam:   Patient refuses  Abdomen:     Soft,  bowel sounds active all four quadrants,    no masses, no organomegaly.  Mild tenderness right upper quadrant, palpable liver edge is mildly tender.  Liver size percussed is normal.  Extremities:   Extremities normal, atraumatic, no cyanosis or edema  Pulses:   2+ and symmetric all extremities  Skin:   Skin color, texture, turgor normal, no rashes or lesions, very fair skin, moderate solar damage forearms  Lymph nodes:   Cervical, supraclavicular, and axillary nodes normal  Neurologic:   normal strength, sensation and reflexes    Throughout. Feet examined.  Diminished sensation to fiber testing in all areas tested.  Pedal pulses are intact.  Skin integrity is good.  Nails are good.  No callus or skin breakdown     ASSESSMENT/PLAN:  1. Controlled type 2 diabetes mellitus with complication, with long-term current use of insulin (  [place]) We will follow every 6 months. - CBC - Comprehensive metabolic panel - Hemoglobin A1c - Lipid panel - Urinalysis, Routine w reflex microscopic  2. Physical exam, annual Limited, due to patient not wanting breast exam.  She declines cancer screening such as mammogram and colonoscopy.  Immunizations are up-to-date.  Still waiting for old records to check tetanus status.   Patient Instructions  Continue current medicines See me in six months Need lab tests prior to next visit Call sooner for problems Walk/exercise every day that you are able   Raylene Everts, MD

## 2016-12-27 ENCOUNTER — Telehealth: Payer: Self-pay | Admitting: Family Medicine

## 2016-12-27 NOTE — Telephone Encounter (Signed)
[facility] called and advised that they can only approve as Outpatient for assistance. If this is Outpatient, they will need a updated Application, [phone]

## 2016-12-27 NOTE — Telephone Encounter (Signed)
Called pt, is calling Chief Strategy Officer for clarification.

## 2017-02-21 ENCOUNTER — Ambulatory Visit: Payer: Medicare HMO | Admitting: "Endocrinology

## 2017-02-21 ENCOUNTER — Other Ambulatory Visit: Payer: Self-pay

## 2017-02-21 MED ORDER — ACCU-CHEK GUIDE W/DEVICE KIT: 1.0000 | PACK | Freq: Four times a day (QID) | 0 refills | Status: AC

## 2017-02-21 MED ORDER — GLUCOSE BLOOD VI STRP: ORAL_STRIP | 1 refills | Status: DC

## 2017-03-02 ENCOUNTER — Ambulatory Visit: Payer: Medicare HMO | Admitting: "Endocrinology

## 2017-03-06 ENCOUNTER — Other Ambulatory Visit: Payer: Self-pay | Admitting: "Endocrinology

## 2017-03-06 ENCOUNTER — Other Ambulatory Visit: Payer: Self-pay | Admitting: Cardiology

## 2017-03-09 ENCOUNTER — Telehealth: Payer: Self-pay

## 2017-03-09 MED ORDER — FUROSEMIDE 40 MG PO TABS: 40.0000 mg | ORAL_TABLET | Freq: Every day | ORAL | 0 refills | Status: DC | PRN

## 2017-03-09 NOTE — Telephone Encounter (Signed)
Pt is requesting a rx for furosemide (LASIX) 40 MG tablet.  Please send to the mail order - [facility]. She also said the old pharmacy will be sending all her meds for a new rx update to go the mail order pharmacy.

## 2017-03-09 NOTE — Telephone Encounter (Signed)
Seen 11 21 18

## 2017-03-10 ENCOUNTER — Other Ambulatory Visit: Payer: Self-pay

## 2017-03-10 MED ORDER — ATENOLOL 50 MG PO TABS: 50.0000 mg | ORAL_TABLET | Freq: Every day | ORAL | 0 refills | Status: DC

## 2017-03-10 MED ORDER — LISINOPRIL 20 MG PO TABS: 20.0000 mg | ORAL_TABLET | Freq: Every day | ORAL | 0 refills | Status: DC

## 2017-03-10 MED ORDER — SERTRALINE HCL 100 MG PO TABS: 100.0000 mg | ORAL_TABLET | Freq: Every day | ORAL | 0 refills | Status: DC

## 2017-03-10 NOTE — Telephone Encounter (Signed)
11 21 18

## 2017-03-11 LAB — COMPLETE METABOLIC PANEL WITH GFR
AG RATIO: 1.6 (calc) (ref 1.0–2.5)
ALBUMIN MSPROF: 4 g/dL (ref 3.6–5.1)
ALT: 23 U/L (ref 6–29)
AST: 27 U/L (ref 10–35)
Alkaline phosphatase (APISO): 63 U/L (ref 33–130)
BUN / CREAT RATIO: 18 (calc) (ref 6–22)
BUN: 18 mg/dL (ref 7–25)
CALCIUM: 9.3 mg/dL (ref 8.6–10.4)
CO2: 27 mmol/L (ref 20–32)
Chloride: 107 mmol/L (ref 98–110)
Creat: 1.01 mg/dL — ABNORMAL HIGH (ref 0.60–0.93)
GFR, EST AFRICAN AMERICAN: 64 mL/min/{1.73_m2} (ref >=60)
GFR, EST NON AFRICAN AMERICAN: 55 mL/min/{1.73_m2} — AB (ref >=60)
GLUCOSE: 125 mg/dL — AB (ref 65–99)
Globulin: 2.5 g/dL (calc) (ref 1.9–3.7)
Potassium: 4.1 mmol/L (ref 3.5–5.3)
Sodium: 143 mmol/L (ref 135–146)
TOTAL PROTEIN: 6.5 g/dL (ref 6.1–8.1)
Total Bilirubin: 0.4 mg/dL (ref 0.2–1.2)

## 2017-03-11 LAB — HEMOGLOBIN A1C
Hgb A1c MFr Bld: 6.8 % of total Hgb — ABNORMAL HIGH (ref <5.7)
MEAN PLASMA GLUCOSE: 148 (calc)
eAG (mmol/L): 8.2 (calc)

## 2017-03-22 ENCOUNTER — Encounter: Payer: Self-pay | Admitting: "Endocrinology

## 2017-03-22 ENCOUNTER — Ambulatory Visit (INDEPENDENT_AMBULATORY_CARE_PROVIDER_SITE_OTHER): Payer: Medicare HMO | Admitting: "Endocrinology

## 2017-03-22 VITALS — BP 122/74 | HR 81 | Ht 63.0 in | Wt 172.0 lb

## 2017-03-22 DIAGNOSIS — E1165 Type 2 diabetes mellitus with hyperglycemia: Secondary | ICD-10-CM | POA: Diagnosis not present

## 2017-03-22 DIAGNOSIS — I10 Essential (primary) hypertension: Secondary | ICD-10-CM

## 2017-03-22 DIAGNOSIS — E782 Mixed hyperlipidemia: Secondary | ICD-10-CM | POA: Diagnosis not present

## 2017-03-22 DIAGNOSIS — Z794 Long term (current) use of insulin: Secondary | ICD-10-CM | POA: Diagnosis not present

## 2017-03-22 DIAGNOSIS — IMO0001 Reserved for inherently not codable concepts without codable children: Secondary | ICD-10-CM

## 2017-03-22 MED ORDER — FREESTYLE LIBRE 14 DAY SENSOR MISC: 1.0000 | 2 refills | Status: DC

## 2017-03-22 MED ORDER — FREESTYLE LIBRE 14 DAY READER DEVI: 1.0000 | Freq: Once | 0 refills | Status: AC

## 2017-03-22 NOTE — Patient Instructions (Signed)

## 2017-03-22 NOTE — Progress Notes (Signed)
Subjective:    Patient ID: Ellen Jordan, female    DOB: 05/01/1945, PCP Caren Macadam, MD   Past Medical History:  Diagnosis Date  . Allergic rhinitis   . Allergy   . Anxiety   . Cancer ([place])   . Carpal tunnel syndrome   . Cataract   . Depression   . Diabetic neuropathy ([place])   . Essential hypertension   . GERD (gastroesophageal reflux disease)   . Mixed hyperlipidemia   . PAF (paroxysmal atrial fibrillation) ([place])    a. s/p TEE cardioversion March 2017. b. Recurrence by event monitor 04/2016.  Marland Kitchen RBBB   . Restless leg syndrome   . Type 2 diabetes mellitus ([place])    Past Surgical History:  Procedure Laterality Date  . ABDOMINAL HYSTERECTOMY    . BACK SURGERY    . CARDIOVERSION N/A 03/11/2015   Procedure: CARDIOVERSION;  Surgeon: [name], MD;  Location: [facility];  Service: Cardiovascular;  Laterality: N/A;  . CHOLECYSTECTOMY    . EYE SURGERY     cataracts  . SPINE SURGERY    . TEE WITHOUT CARDIOVERSION N/A 03/11/2015   Procedure: TRANSESOPHAGEAL ECHOCARDIOGRAM (TEE);  Surgeon: [name], MD;  Location: [facility] ENDOSCOPY;  Service: Cardiovascular;  Laterality: N/A;   Social History   Socioeconomic History  . Marital status: Married    Spouse name: earl  . Number of children: 2  . Years of education: 32  . Highest education level: None  Social Needs  . Financial resource strain: None  . Food insecurity - worry: None  . Food insecurity - inability: None  . Transportation needs - medical: None  . Transportation needs - non-medical: None  Occupational History  . Occupation: retired  Tobacco Use  . Smoking status: Former Smoker    Years: 40.00    Types: Cigarettes    Start date: 01/10/1961    Last attempt to quit: 01/10/2005    Years since quitting: 12.2  . Smokeless tobacco: Never Used  Substance and Sexual Activity  . Alcohol use: No    Alcohol/week: 0.0 oz  . Drug use: No  . Sexual activity: Not Currently  Other Topics Concern  . None   Social History Narrative   Lives with husband Toby (earl)   Likes to read   Outpatient Encounter Medications as of 03/22/2017  Medication Sig  . ACCU-CHEK FASTCLIX LANCETS MISC CHECK BLOOD GLUCOSE FOUR TIMES DAILY  . ACCU-CHEK SMARTVIEW test strip CHECK BLOOD GLUCOSE FOUR TIMES DAILY  . Alcohol Swabs (B-D SINGLE USE SWABS REGULAR) PADS USE FOUR TIMES DAILY  . apixaban (ELIQUIS) 5 MG TABS tablet Take 1 tablet (5 mg total) by mouth 2 (two) times daily.  Marland Kitchen atenolol (TENORMIN) 50 MG tablet Take 1 tablet (50 mg total) by mouth daily.  Marland Kitchen atorvastatin (LIPITOR) 40 MG tablet Take 1 tablet (40 mg total) by mouth daily.  Marland Kitchen b complex vitamins tablet Take 1 tablet by mouth daily.  . Biotin 5000 MCG CAPS Take 1 capsule by mouth daily.  . Blood Glucose Monitoring Suppl (ACCU-CHEK GUIDE) w/Device KIT 1 each by Does not apply route 4 (four) times daily.  . Calcium-Phosphorus-Vitamin D (CALCIUM GUMMIES PO) Take 500-1,000 mg by mouth daily.  . chlorpheniramine (EQ CHLORTABS) 4 MG tablet Take 4 mg by mouth 2 (two) times daily as needed for allergies.  . cholecalciferol (VITAMIN D) 1000 units tablet Take 1,000 Units by mouth daily.  . Continuous Blood Gluc Receiver (FREESTYLE LIBRE 14  DAY READER) DEVI 1 each by Does not apply route once for 1 dose.  . Continuous Blood Gluc Sensor (FREESTYLE LIBRE 14 DAY SENSOR) MISC Inject 1 each into the skin every 14 (fourteen) days. Use as directed.  . diltiazem (DILTIAZEM CD) 120 MG 24 hr capsule Take 1 capsule (120 mg total) by mouth daily.  . diphenhydrAMINE (BENADRYL) 25 MG tablet Take 25 mg by mouth daily.  . flecainide (TAMBOCOR) 50 MG tablet Take 1 tablet (50 mg total) by mouth 2 (two) times daily.  . furosemide (LASIX) 40 MG tablet Take 1 tablet (40 mg total) by mouth daily as needed.  Marland Kitchen glucose blood (ACCU-CHEK GUIDE) test strip Use as instructed 4 x daily  . insulin aspart (NOVOLOG) 100 UNIT/ML injection Inject 10-16 Units into the skin 3 (three) times daily with  meals.  . insulin detemir (LEVEMIR) 100 UNIT/ML injection Inject 0.5 mLs (50 Units total) into the skin at bedtime.  . liraglutide (VICTOZA) 18 MG/3ML SOPN Inject 0.3 mLs (1.8 mg total) into the skin daily.  Marland Kitchen lisinopril (PRINIVIL,ZESTRIL) 20 MG tablet Take 1 tablet (20 mg total) by mouth daily.  . Misc Natural Products (ESTROVEN + ENERGY MAX STRENGTH) TABS Take 1 tablet by mouth daily.  . Multiple Vitamin (MULTIVITAMIN) tablet Take 1 tablet by mouth daily.  . Omega-3 Fatty Acids (FISH OIL) 1200 MG CAPS Take 1 capsule by mouth daily.  Marland Kitchen omeprazole (PRILOSEC) 20 MG capsule TAKE 1 CAPSULE (20 MG TOTAL) BY MOUTH DAILY.  Marland Kitchen sertraline (ZOLOFT) 100 MG tablet Take 1 tablet (100 mg total) by mouth daily.  . vitamin E (VITAMIN E) 400 UNIT capsule Take 400 Units by mouth daily.  . [DISCONTINUED] Continuous Blood Gluc Sensor (FREESTYLE LIBRE SENSOR SYSTEM) MISC Use one sensor every 10 days.   No facility-administered encounter medications on file as of 03/22/2017.    ALLERGIES: Allergies  Allergen Reactions  . Penicillins Hives    Has patient had a PCN reaction causing immediate rash, facial/tongue/throat swelling, SOB or lightheadedness with hypotension: Yes Has patient had a PCN reaction causing severe rash involving mucus membranes or skin necrosis: No Has patient had a PCN reaction that required hospitalization No Has patient had a PCN reaction occurring within the last 10 years: No If all of the above answers are "NO", then may proceed with Cephalosporin use.    VACCINATION STATUS: Immunization History  Administered Date(s) Administered  . Influenza,inj,Quad PF,6+ Mos 11/04/2016  . Pneumococcal Conjugate-13 11/30/2016    Diabetes  She presents for her follow-up diabetic visit. She has type 2 diabetes mellitus. Onset time: She was diagnosed at approximate age of 12 years. Her disease course has been improving. There are no hypoglycemic associated symptoms. Pertinent negatives for  hypoglycemia include no confusion, headaches, pallor or seizures. There are no diabetic associated symptoms. Pertinent negatives for diabetes include no chest pain, no polydipsia, no polyphagia and no polyuria. There are no hypoglycemic complications. Symptoms are improving. There are no diabetic complications. Risk factors for coronary artery disease include diabetes mellitus, dyslipidemia, hypertension, obesity, sedentary lifestyle and tobacco exposure. Current diabetic treatment includes intensive insulin program. She is compliant with treatment most of the time. Her weight is decreasing steadily. She is following a generally unhealthy diet. She has not had a previous visit with a dietitian (She declined referral to CDE.). Her home blood glucose trend is decreasing steadily. Her breakfast blood glucose range is generally 140-180 mg/dl. Her lunch blood glucose range is generally 140-180 mg/dl. Her dinner blood glucose  range is generally 140-180 mg/dl. Her overall blood glucose range is 140-180 mg/dl. An ACE inhibitor/angiotensin II receptor blocker is being taken. She does not see a podiatrist.Eye exam is not current (She is overdue for eye exam, I advised her to schedule a visit to her eye doctor.).  Hyperlipidemia  This is a chronic problem. The current episode started more than 1 year ago. The problem is controlled. Exacerbating diseases include diabetes and obesity. Pertinent negatives include no chest pain, leg pain, myalgias or shortness of breath. Current antihyperlipidemic treatment includes statins. Risk factors for coronary artery disease include dyslipidemia, diabetes mellitus, hypertension, a sedentary lifestyle and obesity.  Hypertension  This is a chronic problem. The current episode started more than 1 year ago. The problem is controlled. Pertinent negatives include no chest pain, headaches, palpitations or shortness of breath. Risk factors for coronary artery disease include diabetes mellitus,  dyslipidemia, obesity, sedentary lifestyle and smoking/tobacco exposure. Past treatments include ACE inhibitors. The current treatment provides moderate improvement.     Review of Systems  Constitutional: Negative for unexpected weight change.  HENT: Negative for trouble swallowing and voice change.   Eyes: Negative for visual disturbance.  Respiratory: Negative for cough, shortness of breath and wheezing.   Cardiovascular: Negative for chest pain, palpitations and leg swelling.  Gastrointestinal: Negative for diarrhea, nausea and vomiting.  Endocrine: Negative for cold intolerance, heat intolerance, polydipsia, polyphagia and polyuria.  Musculoskeletal: Negative for arthralgias and myalgias.  Skin: Negative for color change, pallor, rash and wound.  Neurological: Negative for seizures and headaches.  Psychiatric/Behavioral: Negative for confusion and suicidal ideas.    Objective:    BP 122/74   Pulse 81   Ht _0  (1.6 m)   Wt 172 lb (78 kg)   BMI 30.47 kg/m   Wt Readings from Last 3 Encounters:  03/22/17 172 lb (78 kg)  11/30/16 172 lb 0.6 oz (78 kg)  11/04/16 174 lb 0.6 oz (78.9 kg)    Physical Exam  Constitutional: She is oriented to person, place, and time. She appears well-developed.  HENT:  Head: Normocephalic and atraumatic.  Eyes: EOM are normal.  Neck: Normal range of motion. Neck supple. No tracheal deviation present. No thyromegaly present.  Cardiovascular: Normal rate and regular rhythm.  Pulmonary/Chest: Effort normal and breath sounds normal.  Abdominal: There is no tenderness. There is no guarding.  Musculoskeletal: Normal range of motion. She exhibits no edema.  Neurological: She is alert and oriented to person, place, and time. She has normal reflexes. No cranial nerve deficit. Coordination normal.  Skin: Skin is warm and dry. No rash noted. No erythema. No pallor.  Psychiatric: She has a normal mood and affect. Judgment normal.    Results for orders  placed or performed in visit on 10/21/16  COMPLETE METABOLIC PANEL WITH GFR  Result Value Ref Range   Glucose, Bld 125 (H) 65 - 99 mg/dL   BUN 18 7 - 25 mg/dL   Creat 1.01 (H) 0.60 - 0.93 mg/dL   GFR, Est Non African American 55 (L) > OR = 60 mL/min/1.53m   GFR, Est African American 64 > OR = 60 mL/min/1.754m  BUN/Creatinine Ratio 18 6 - 22 (calc)   Sodium 143 135 - 146 mmol/L   Potassium 4.1 3.5 - 5.3 mmol/L   Chloride 107 98 - 110 mmol/L   CO2 27 20 - 32 mmol/L   Calcium 9.3 8.6 - 10.4 mg/dL   Total Protein 6.5 6.1 - 8.1 g/dL  Albumin 4.0 3.6 - 5.1 g/dL   Globulin 2.5 1.9 - 3.7 g/dL (calc)   AG Ratio 1.6 1.0 - 2.5 (calc)   Total Bilirubin 0.4 0.2 - 1.2 mg/dL   Alkaline phosphatase (APISO) 63 33 - 130 U/L   AST 27 10 - 35 U/L   ALT 23 6 - 29 U/L  Hemoglobin A1c  Result Value Ref Range   Hgb A1c MFr Bld 6.8 (H) <5.7 % of total Hgb   Mean Plasma Glucose 148 (calc)   eAG (mmol/L) 8.2 (calc)     Assessment & Plan:   1. Uncontrolled type 2 diabetes mellitus without complication, with long-term current use of insulin   - She remains at a high risk for more acute and chronic complications of diabetes which include CAD, CVA, CKD, retinopathy, and neuropathy. These are all discussed in detail with the patient.  Patient came with near target glucose profile, and  recent A1c  Stable at 6.8%,  overall improving from 14.6 %.  Glucose logs and insulin administration records pertaining to this visit,  to be scanned into patient's records.  Recent labs reviewed.   - I have re-counseled the patient on diet management  by adopting a carbohydrate restricted / protein rich  Diet.  -  Suggestion is made for her to avoid simple carbohydrates  from her diet including Cakes, Sweet Desserts / Pastries, Ice Cream, Soda (diet and regular), Sweet Tea, Candies, Chips, Cookies, Store Bought Juices, Alcohol in Excess of  1-2 drinks a day, Artificial Sweeteners, and "Sugar-free" Products. This will help  patient to have stable blood glucose profile and potentially avoid unintended weight gain.   - Patient is advised to stick to a routine mealtimes to eat 3 meals  a day and avoid unnecessary snacks ( to snack only to correct hypoglycemia).  - The patient  declined a referral to a  CDE for individualized DM education.  - I have approached patient with the following individualized plan to manage diabetes and patient agrees.  -Based on her stable glycemic profile, I advised her to continue on her current dosing regimen of insulin.  -Continue Levemir 50 units nightly, NovoLog 10 units units   3 times a day before meals  , plus correction dose associated with monitoring of blood glucose 4 times a day-before meals and at bedtime.  -Continue Victoza at 1.8 mg subcutaneously daily .  - She will benefit from continuous glucose monitoring. I discussed and initiated a prescription for the [facility] device for her.  - Patient specific target  for A1c; LDL, HDL, Triglycerides, and  Waist Circumference were discussed in detail.  2) BP/HTN: Her blood pressure is controlled to target.   Continue current medications including ACEI/ARB. 3) Lipids/HPL:  Controlled with LDL at 54, she will have fasting lipid panel before next visit.  I advised her to continue statins. 4)  Weight/Diet:  declined CDE consult, exercise, and carbohydrates information provided.  5) Chronic Care/Health Maintenance:  -Patient is on ACEI/ARB and Statin medications and encouraged to continue to follow up with Ophthalmology (she is overdue for her eye exam), Podiatrist at least yearly or according to recommendations, and advised to  stay away from smoking. I have recommended yearly flu vaccine and pneumonia vaccination at least every 5 years; moderate intensity exercise for up to 150 minutes weekly; and  sleep for at least 7 hours a day.  - Time spent with the patient: 25 min, of which >50% was spent in reviewing her  blood glucose  logs , discussing her hypo- and hyper-glycemic episodes, reviewing her current and  previous labs and insulin doses and developing a plan to avoid hypo- and hyper-glycemia. Please refer to Patient Instructions for Blood Glucose Monitoring and Insulin/Medications Dosing Guide"  in media tab for additional information.   - I advised patient to maintain close follow up with Caren Macadam, MD for primary care needs.  Follow up plan: -Return in about 3 months (around 06/22/2017) for meter, and logs.  Glade Lloyd, MD Phone: [phone]  Fax: [phone]   -  This note was partially dictated with voice recognition software. Similar sounding words can be transcribed inadequately or may not  be corrected upon review.  03/22/2017, 10:48 AM

## 2017-04-24 ENCOUNTER — Other Ambulatory Visit: Payer: Self-pay

## 2017-04-24 ENCOUNTER — Ambulatory Visit (INDEPENDENT_AMBULATORY_CARE_PROVIDER_SITE_OTHER): Payer: Medicare HMO | Admitting: Family Medicine

## 2017-04-24 ENCOUNTER — Encounter: Payer: Self-pay | Admitting: Family Medicine

## 2017-04-24 VITALS — BP 114/48 | HR 74 | Temp 98.7°F | Resp 20 | Ht 63.0 in | Wt 169.0 lb

## 2017-04-24 DIAGNOSIS — J189 Pneumonia, unspecified organism: Secondary | ICD-10-CM

## 2017-04-24 MED ORDER — BENZONATATE 200 MG PO CAPS: 200.0000 mg | ORAL_CAPSULE | Freq: Two times a day (BID) | ORAL | 0 refills | Status: DC | PRN

## 2017-04-24 MED ORDER — LEVOFLOXACIN 750 MG PO TABS: 750.0000 mg | ORAL_TABLET | Freq: Every day | ORAL | 0 refills | Status: DC

## 2017-04-24 NOTE — Progress Notes (Signed)
Chief Complaint  Patient presents with  . Cough    x 1 month  . sinus drainage  . Fatigue   Patient is here for a sick visit. She states "I feel like I have pneumonia again. She was treated for pneumonia in 2017, was hospitalized.  She states that she has fatigue, sweats and chills, cough, chest congestion and "tons of mucus". Mild postnasal drip and sinus drainage.  No sore throat.  Her voice is hoarse.  No headache.  No ear pain.  No nausea or vomiting.  No dizziness or presyncope.  No chest pain, feels unable to take a deep breath.  She feels like she is wheezing when she tries to lie down to sleep. No history of underlying COPD or asthma  Patient Active Problem List   Diagnosis Date Noted  . History of tobacco abuse 11/09/2016  . Controlled diabetes mellitus type 2 with complications ([place]) 32/20/2542  . Restless leg syndrome 11/04/2016  . PAF (paroxysmal atrial fibrillation) ([place]) 06/03/2016  . RBBB 06/03/2016  . Atrial fibrillation with rapid ventricular response ([place]) 02/28/2015  . Uncontrolled type 2 diabetes mellitus without complication, with long-term current use of insulin ([place]) 12/24/2014  . Mixed hyperlipidemia 12/24/2014  . Essential hypertension, benign 12/24/2014    Outpatient Encounter Medications as of 04/24/2017  Medication Sig  . ACCU-CHEK FASTCLIX LANCETS MISC CHECK BLOOD GLUCOSE FOUR TIMES DAILY  . ACCU-CHEK SMARTVIEW test strip CHECK BLOOD GLUCOSE FOUR TIMES DAILY  . Alcohol Swabs (B-D SINGLE USE SWABS REGULAR) PADS USE FOUR TIMES DAILY  . apixaban (ELIQUIS) 5 MG TABS tablet Take 1 tablet (5 mg total) by mouth 2 (two) times daily.  Marland Kitchen atenolol (TENORMIN) 50 MG tablet Take 1 tablet (50 mg total) by mouth daily.  Marland Kitchen atorvastatin (LIPITOR) 40 MG tablet Take 40 mg by mouth daily.  Marland Kitchen b complex vitamins tablet Take 1 tablet by mouth daily.  . Biotin 5000 MCG CAPS Take 1 capsule by mouth daily.  . Blood Glucose Monitoring Suppl (ACCU-CHEK GUIDE) w/Device KIT 1  each by Does not apply route 4 (four) times daily.  . Calcium-Phosphorus-Vitamin D (CALCIUM GUMMIES PO) Take 500-1,000 mg by mouth daily.  . chlorpheniramine (EQ CHLORTABS) 4 MG tablet Take 4 mg by mouth 2 (two) times daily as needed for allergies.  . cholecalciferol (VITAMIN D) 1000 units tablet Take 1,000 Units by mouth daily.  Marland Kitchen diltiazem (DILTIAZEM CD) 120 MG 24 hr capsule Take 1 capsule (120 mg total) by mouth daily.  . diphenhydrAMINE (BENADRYL) 25 MG tablet Take 25 mg by mouth daily.  . flecainide (TAMBOCOR) 50 MG tablet Take 1 tablet (50 mg total) by mouth 2 (two) times daily.  . furosemide (LASIX) 40 MG tablet Take 1 tablet (40 mg total) by mouth daily as needed.  Marland Kitchen glucose blood (ACCU-CHEK GUIDE) test strip Use as instructed 4 x daily  . insulin aspart (NOVOLOG) 100 UNIT/ML injection Inject 10-16 Units into the skin 3 (three) times daily with meals.  . insulin detemir (LEVEMIR) 100 UNIT/ML injection Inject 0.5 mLs (50 Units total) into the skin at bedtime.  . liraglutide (VICTOZA) 18 MG/3ML SOPN Inject 0.3 mLs (1.8 mg total) into the skin daily.  Marland Kitchen lisinopril (PRINIVIL,ZESTRIL) 20 MG tablet Take 1 tablet (20 mg total) by mouth daily.  . Misc Natural Products (ESTROVEN + ENERGY MAX STRENGTH) TABS Take 1 tablet by mouth daily.  . Multiple Vitamin (MULTIVITAMIN) tablet Take 1 tablet by mouth daily.  . Omega-3 Fatty Acids (FISH  OIL) 1200 MG CAPS Take 1 capsule by mouth daily.  Marland Kitchen omeprazole (PRILOSEC) 20 MG capsule TAKE 1 CAPSULE (20 MG TOTAL) BY MOUTH DAILY.  Marland Kitchen sertraline (ZOLOFT) 100 MG tablet Take 1 tablet (100 mg total) by mouth daily.  . vitamin E (VITAMIN E) 400 UNIT capsule Take 400 Units by mouth daily.  . benzonatate (TESSALON) 200 MG capsule Take 1 capsule (200 mg total) by mouth 2 (two) times daily as needed for cough.  Marland Kitchen levofloxacin (LEVAQUIN) 750 MG tablet Take 1 tablet (750 mg total) by mouth daily.   No facility-administered encounter medications on file as of 04/24/2017.       Allergies  Allergen Reactions  . Penicillins Hives    Has patient had a PCN reaction causing immediate rash, facial/tongue/throat swelling, SOB or lightheadedness with hypotension: Yes Has patient had a PCN reaction causing severe rash involving mucus membranes or skin necrosis: No Has patient had a PCN reaction that required hospitalization No Has patient had a PCN reaction occurring within the last 10 years: No If all of the above answers are "NO", then may proceed with Cephalosporin use.     Review of Systems  Constitutional: Positive for appetite change, chills, fatigue and fever.  HENT: Positive for congestion, postnasal drip and rhinorrhea. Negative for sinus pressure, sinus pain and sore throat.   Eyes: Negative for redness and visual disturbance.  Respiratory: Positive for cough, chest tightness and wheezing. Negative for shortness of breath.   Cardiovascular: Negative for chest pain and palpitations.  Gastrointestinal: Positive for nausea. Negative for diarrhea and vomiting.  Genitourinary: Negative for difficulty urinating and frequency.  Musculoskeletal: Positive for myalgias.  Neurological: Negative for dizziness and headaches.  Psychiatric/Behavioral: Positive for sleep disturbance.    Physical Exam  Constitutional: She is oriented to person, place, and time. She appears well-developed and well-nourished. She appears distressed.  Appears fatigued, moderately ill  HENT:  Head: Normocephalic and atraumatic.  Right Ear: External ear normal.  Left Ear: External ear normal.  Nose: Nose normal.  Mouth/Throat: Oropharynx is clear and moist.  Eyes: Pupils are equal, round, and reactive to light. Conjunctivae are normal.  Neck: Normal range of motion.  Cardiovascular: Normal rate and regular rhythm.  Murmur heard. Pulmonary/Chest: Effort normal. She has rales.  Rales in both bases  Abdominal: Soft. Bowel sounds are normal. There is no tenderness.  Musculoskeletal:  Normal range of motion. She exhibits no edema.  Lymphadenopathy:    She has no cervical adenopathy.  Neurological: She is alert and oriented to person, place, and time.  Psychiatric: She has a normal mood and affect. Her behavior is normal.    BP (!) 114/48   Pulse 74   Temp 98.7 F (37.1 C) (Oral)   Resp 20   Ht _0  (1.6 m)   Wt 169 lb (76.7 kg)   SpO2 92%   BMI 29.94 kg/m     ASSESSMENT/PLAN:  1. Community acquired pneumonia of left lung, unspecified part of lung Discussed that pneumonia is potentially serious.  If she has worsening symptoms, uncontrolled fever, vomiting, or difficulty keeping down her medication then she needs to go to the emergency room.  Call for any problems.   Patient Instructions  Take the antibiotic daily Cough medicine as needed Push fluids Rest Call if not better in 2-3 days   Raylene Everts, MD

## 2017-04-24 NOTE — Patient Instructions (Signed)
Take the antibiotic daily Cough medicine as needed Push fluids Rest Call if not better in 2-3 days

## 2017-05-12 ENCOUNTER — Other Ambulatory Visit: Payer: Self-pay | Admitting: Family Medicine

## 2017-05-13 ENCOUNTER — Other Ambulatory Visit: Payer: Self-pay | Admitting: Family Medicine

## 2017-05-13 ENCOUNTER — Other Ambulatory Visit: Payer: Self-pay | Admitting: Cardiology

## 2017-05-23 NOTE — Telephone Encounter (Signed)
Please advise needs to schedule office visit within the next month. Medications refilled.

## 2017-05-24 NOTE — Telephone Encounter (Signed)
Left generic message requesting call back.

## 2017-06-01 ENCOUNTER — Ambulatory Visit: Payer: Medicare HMO | Admitting: Family Medicine

## 2017-06-07 ENCOUNTER — Other Ambulatory Visit: Payer: Self-pay

## 2017-06-07 ENCOUNTER — Ambulatory Visit (INDEPENDENT_AMBULATORY_CARE_PROVIDER_SITE_OTHER): Payer: Medicare HMO | Admitting: Family Medicine

## 2017-06-07 ENCOUNTER — Encounter: Payer: Self-pay | Admitting: Family Medicine

## 2017-06-07 VITALS — BP 116/40 | HR 69 | Temp 98.2°F | Resp 16 | Ht 63.0 in | Wt 170.1 lb

## 2017-06-07 DIAGNOSIS — Z79899 Other long term (current) drug therapy: Secondary | ICD-10-CM

## 2017-06-07 DIAGNOSIS — Z23 Encounter for immunization: Secondary | ICD-10-CM | POA: Diagnosis not present

## 2017-06-07 DIAGNOSIS — E782 Mixed hyperlipidemia: Secondary | ICD-10-CM | POA: Diagnosis not present

## 2017-06-07 DIAGNOSIS — Z794 Long term (current) use of insulin: Secondary | ICD-10-CM | POA: Diagnosis not present

## 2017-06-07 DIAGNOSIS — R5383 Other fatigue: Secondary | ICD-10-CM

## 2017-06-07 DIAGNOSIS — E118 Type 2 diabetes mellitus with unspecified complications: Secondary | ICD-10-CM

## 2017-06-07 DIAGNOSIS — J449 Chronic obstructive pulmonary disease, unspecified: Secondary | ICD-10-CM | POA: Diagnosis not present

## 2017-06-07 DIAGNOSIS — L989 Disorder of the skin and subcutaneous tissue, unspecified: Secondary | ICD-10-CM | POA: Diagnosis not present

## 2017-06-07 MED ORDER — ALBUTEROL SULFATE HFA 108 (90 BASE) MCG/ACT IN AERS: 2.0000 | INHALATION_SPRAY | Freq: Four times a day (QID) | RESPIRATORY_TRACT | 0 refills | Status: DC | PRN

## 2017-06-07 NOTE — Progress Notes (Signed)
Patient ID: Ellen Jordan, female    DOB: Apr 21, 1945, 72 y.o.   MRN: [id number]  Chief Complaint  Patient presents with  . Establish Care    Former Ellen Jordan pt    Allergies Penicillins  Subjective:   Ellen Jordan is a 72 y.o. female who presents to [facility] today.  HPI Ellen Jordan presents today as a new patient visit to establish care with me.  She is previously been seen in our practice by Dr. Lysle Morales.  Patient reports that she is doing well.  She denies any complaints other than she has lots of places on her skin that she feels needs to be checked by dermatology.  She reports that she has had to have lesions frozen on her skin in the past but has not seen a dermatologist in several years.  She reports that her energy is low but she just correlates that to getting older.  She does have diabetes which is well controlled and followed by Dr. Dorris Jordan.  She reports compliance with her blood pressure medication and cholesterol medication.  She denies any myalgias.  She denies any hypoglycemic episodes.  She denies any chest pain, shortness of breath, or swelling in her extremities.  She does report if she is out in the heat and walks for a long period of time that she can get a little winded.  She does have COPD as evidenced by a chest x-ray.  She reports that she smoked for many years but quit about 10 years ago.  She denies any cough, hemoptysis, or chronic sputum production.  She does not use an inhaler on a daily basis.  She rarely uses her rescue inhaler but has one at home.  She is not even sure when it expires.  She was seen and evaluated by Dr. Meda Jordan several months ago and diagnosed with a community-acquired pneumonia.  She did not have a chest x-ray performed at that time but reports that she had a bad cough and was feeling terrible.  She was treated with antibiotics.  Patient reports that her symptoms have subsequently resolved.  She reports that she feels well.  Appetite is  good. She reports that she takes medicine for reflux every day.  She denies any vaginal bleeding.  She did have a hysterectomy many years ago secondary to uterine cancer.  She does not wish to have any follow-up by gynecology. She reports that she does not believe in getting mammograms or colonoscopies performed.  She adamantly defers a mammogram or a colonoscopy.  She does not wish to have a bone density.  She would be agreeable to getting a tetanus shot today.   Past Medical History:  Diagnosis Date  . Allergic rhinitis   . Allergy   . Anxiety   . Cancer ([place])    uterine  . Carpal tunnel syndrome   . Cataract   . Depression   . Diabetic neuropathy ([place])   . Essential hypertension   . GERD (gastroesophageal reflux disease)   . Mixed hyperlipidemia   . PAF (paroxysmal atrial fibrillation) ([place])    a. s/p TEE cardioversion March 2017. b. Recurrence by event monitor 04/2016.  Marland Kitchen RBBB   . Restless leg syndrome   . Type 2 diabetes mellitus ([place])     Past Surgical History:  Procedure Laterality Date  . ABDOMINAL HYSTERECTOMY     uterine cancer  . BACK SURGERY    . CARDIOVERSION N/A 03/11/2015   Procedure: CARDIOVERSION;  Surgeon: [name], MD;  Location: [facility];  Service: Cardiovascular;  Laterality: N/A;  . CHOLECYSTECTOMY    . EYE SURGERY     cataracts  . SPINE SURGERY    . TEE WITHOUT CARDIOVERSION N/A 03/11/2015   Procedure: TRANSESOPHAGEAL ECHOCARDIOGRAM (TEE);  Surgeon: [name], MD;  Location: [facility];  Service: Cardiovascular;  Laterality: N/A;    Family History  Problem Relation Age of Onset  . Heart disease Mother 64  . Hyperlipidemia Mother   . Diabetes Brother   . COPD Father 46       emphysema  . Hypertension Father   . Hyperlipidemia Father   . Diabetes Daughter   . AAA (abdominal aortic aneurysm) Son   . Diabetes Brother   . Diabetes Brother   . Diabetes Brother      Social History   Socioeconomic History  . Marital status:  Married    Spouse name: earl  . Number of children: 2  . Years of education: 57  . Highest education level: Not on file  Occupational History  . Occupation: retired  Scientific laboratory technician  . Financial resource strain: Not on file  . Food insecurity:    Worry: Not on file    Inability: Not on file  . Transportation needs:    Medical: Not on file    Non-medical: Not on file  Tobacco Use  . Smoking status: Former Smoker    Packs/day: 1.00    Years: 50.00    Pack years: 50.00    Types: Cigarettes    Start date: 01/10/1961    Last attempt to quit: 01/10/2005    Years since quitting: 12.4  . Smokeless tobacco: Former Systems developer    Quit date: 01/10/2005  Substance and Sexual Activity  . Alcohol use: No    Alcohol/week: 0.0 oz  . Drug use: No  . Sexual activity: Not Currently    Partners: Male  Lifestyle  . Physical activity:    Days per week: Not on file    Minutes per session: Not on file  . Stress: Not on file  Relationships  . Social connections:    Talks on phone: Not on file    Gets together: Not on file    Attends religious service: Not on file    Active member of club or organization: Not on file    Attends meetings of clubs or organizations: Not on file    Relationship status: Not on file  Other Topics Concern  . Not on file  Social History Narrative   Moved to this area in 27 from [place].    Married for over 50 years. Has two children. Lives with husband Ellen Jordan (earl)   Likes to read   Retired from [employer].   Enjoys time with family, beach, read, exericse.   Walk dog.   Eats all food groups.   Wear seatbelt.   Wear sunscreen.   Current Outpatient Medications on File Prior to Visit  Medication Sig Dispense Refill  . ACCU-CHEK FASTCLIX LANCETS MISC CHECK BLOOD GLUCOSE FOUR TIMES DAILY 408 each 2  . ACCU-CHEK SMARTVIEW test strip CHECK BLOOD GLUCOSE FOUR TIMES DAILY 400 each 2  . Alcohol Swabs (B-D SINGLE USE SWABS REGULAR) PADS USE FOUR TIMES DAILY 400 each 2  .  apixaban (ELIQUIS) 5 MG TABS tablet Take 1 tablet (5 mg total) by mouth 2 (two) times daily. 180 tablet 3  . atenolol (TENORMIN) 50 MG tablet TAKE 1 TABLET (50  MG TOTAL) BY MOUTH DAILY. 90 tablet 0  . atorvastatin (LIPITOR) 40 MG tablet TAKE 1 TABLET EVERY DAY 90 tablet 3  . b complex vitamins tablet Take 1 tablet by mouth daily.    . benzonatate (TESSALON) 200 MG capsule Take 1 capsule (200 mg total) by mouth 2 (two) times daily as needed for cough. 20 capsule 0  . Biotin 5000 MCG CAPS Take 1 capsule by mouth daily.    . Blood Glucose Monitoring Suppl (ACCU-CHEK GUIDE) w/Device KIT 1 each by Does not apply route 4 (four) times daily. 1 kit 0  . Calcium-Phosphorus-Vitamin D (CALCIUM GUMMIES PO) Take 500-1,000 mg by mouth daily.    . chlorpheniramine (EQ CHLORTABS) 4 MG tablet Take 4 mg by mouth 2 (two) times daily as needed for allergies.    . cholecalciferol (VITAMIN D) 1000 units tablet Take 1,000 Units by mouth daily.    Marland Kitchen diltiazem (DILTIAZEM CD) 120 MG 24 hr capsule Take 1 capsule (120 mg total) by mouth daily. 90 capsule 3  . diphenhydrAMINE (BENADRYL) 25 MG tablet Take 25 mg by mouth daily.    . flecainide (TAMBOCOR) 50 MG tablet Take 1 tablet (50 mg total) by mouth 2 (two) times daily. 180 tablet 3  . furosemide (LASIX) 40 MG tablet TAKE 1 TABLET (40 MG TOTAL) BY MOUTH DAILY AS NEEDED. 30 tablet 0  . glucose blood (ACCU-CHEK GUIDE) test strip Use as instructed 4 x daily 400 each 1  . insulin aspart (NOVOLOG) 100 UNIT/ML injection Inject 10-16 Units into the skin 3 (three) times daily with meals. 50 mL 1  . insulin detemir (LEVEMIR) 100 UNIT/ML injection Inject 0.5 mLs (50 Units total) into the skin at bedtime. 60 mL 1  . liraglutide (VICTOZA) 18 MG/3ML SOPN Inject 0.3 mLs (1.8 mg total) into the skin daily. 27 mL 1  . lisinopril (PRINIVIL,ZESTRIL) 20 MG tablet TAKE 1 TABLET (20 MG TOTAL) BY MOUTH DAILY. 90 tablet 0  . Misc Natural Products (ESTROVEN + ENERGY MAX STRENGTH) TABS Take 1  tablet by mouth daily.    . Multiple Vitamin (MULTIVITAMIN) tablet Take 1 tablet by mouth daily.    . Omega-3 Fatty Acids (FISH OIL) 1200 MG CAPS Take 1 capsule by mouth daily.    Marland Kitchen omeprazole (PRILOSEC) 20 MG capsule TAKE 1 CAPSULE (20 MG TOTAL) BY MOUTH DAILY. 90 capsule 1  . sertraline (ZOLOFT) 100 MG tablet Take 1 tablet (100 mg total) by mouth daily. 90 tablet 0  . vitamin E (VITAMIN E) 400 UNIT capsule Take 400 Units by mouth daily.     No current facility-administered medications on file prior to visit.     Review of Systems  Constitutional: Positive for fatigue. Negative for activity change, appetite change and fever.  HENT: Negative for trouble swallowing and voice change.   Eyes: Negative for visual disturbance.  Respiratory: Negative for cough, chest tightness and shortness of breath.   Cardiovascular: Negative for chest pain, palpitations and leg swelling.  Gastrointestinal: Negative for abdominal pain, nausea and vomiting.  Endocrine: Negative for polyphagia and polyuria.  Genitourinary: Negative for dysuria, frequency, urgency, vaginal bleeding and vaginal discharge.  Musculoskeletal: Negative for arthralgias and myalgias.  Neurological: Negative for dizziness, tremors, syncope, weakness and light-headedness.  Hematological: Negative for adenopathy.  Psychiatric/Behavioral: Negative for confusion, dysphoric mood, self-injury and sleep disturbance. The patient is not nervous/anxious.      Objective:   BP (!) 116/40 (BP Location: Left Arm, Patient Position: Sitting, Cuff Size: Large)  Pulse 69   Temp 98.2 F (36.8 C) (Oral)   Resp 16   Ht _0  (1.6 m)   Wt 170 lb 1.3 oz (77.1 kg)   SpO2 92%   BMI 30.13 kg/m   Physical Exam  Constitutional: She is oriented to person, place, and time. She appears well-developed and well-nourished. No distress.  HENT:  Head: Normocephalic and atraumatic.  Mouth/Throat: Oropharynx is clear and moist.  Eyes: Pupils are equal,  round, and reactive to light. No scleral icterus.  Neck: Normal range of motion. Neck supple. No thyromegaly present.  Cardiovascular: Normal rate, regular rhythm and normal heart sounds.  Pulmonary/Chest: Effort normal and breath sounds normal. No respiratory distress.  Abdominal: Soft. Bowel sounds are normal. She exhibits no distension.  Neurological: She is alert and oriented to person, place, and time. No cranial nerve deficit.  Skin: Skin is warm and dry.  Multiple erythematous plaques/macules on skin.  Left cheek with erythematous scaly macule.  Psychiatric: She has a normal mood and affect. Her behavior is normal. Judgment and thought content normal.  Nursing note and vitals reviewed.   Depression screen [facility] 2/9 06/07/2017 04/24/2017 11/04/2016 04/07/2016 01/06/2016  Decreased Interest 1 1 0 0 0  Down, Depressed, Hopeless 1 1 0 0 0  PHQ - 2 Score 2 2 0 0 0  Altered sleeping 1 2 - - -  Tired, decreased energy 2 1 - - -  Change in appetite 0 1 - - -  Feeling bad or failure about yourself  0 0 - - -  Trouble concentrating 1 0 - - -  Moving slowly or fidgety/restless 0 0 - - -  Suicidal thoughts 0 0 - - -  PHQ-9 Score 6 6 - - -  Difficult doing work/chores Somewhat difficult Somewhat difficult - - -    Assessment and Plan  1. Chronic obstructive pulmonary disease, unspecified COPD type ([place]) Discussed diagnosis of COPD with patient.  Will refill her inhaler at this time.  She defers any pulmonary function testing.  She is functioning well.  She does not currently smoke and quit approximately a decade ago.  She is not symptomatic.  If she develops any changes or problems with her breathing she will let us know.  Her chest x-ray from greater than 1 year ago was reviewed with her today. - Pneumococcal polysaccharide vaccine 23-valent greater than or equal to 2yo subcutaneous/IM - albuterol (PROVENTIL HFA;VENTOLIN HFA) 108 (90 Base) MCG/ACT inhaler; Inhale 2 puffs into the lungs every 6  (six) hours as needed for wheezing or shortness of breath.  Dispense: 1 Inhaler; Refill: 0  2. Immunization due Vaccination given today - Td : Tetanus/diphtheria >7yo Preservative  free  3. Mixed hyperlipidemia Check cholesterol panel today. - Lipid panel   4. High risk medication use - COMPLETE METABOLIC PANEL WITH GFR  5. Fatigue, unspecified type Suspect intermittent fatigue is secondary to mood disorder.  Check labs. - CBC with Differential/Platelet - Vitamin B12 - TSH  6. Controlled type 2 diabetes mellitus with complication, with long-term current use of insulin ([place]) Continue visits with Dr. Dorris Jordan.  She is due for labs with Dr. Dorris Jordan in June.  She will get her blood work for cholesterol at that time and for other metabolic testing.  Referral for eye exam placed today.  - Ambulatory referral to Ophthalmology secondary to diabetes. 7. Skin lesion Referral to dermatology placed. - Ambulatory referral to Dermatology   Patient adamantly defers colonoscopy or mammography screening.  She understands that these are preventive test to screen for breast cancer and colon cancer.  She defers the testing and understands that refusal of these tests could affect her morbidity and mortality.  She voiced understanding.  She did agree that if she has any pain or problems or any worrisome symptoms that she will contact our office. Return in about 3 months (around 09/07/2017) for follow up. Caren Macadam, MD 06/07/2017

## 2017-06-07 NOTE — Patient Instructions (Signed)
Calcium 600 mg twice day Vitamin D 2000 IU a day  Get your labs done in June Call and see when you need to follow up with Dr. Domenic Polite

## 2017-06-15 ENCOUNTER — Encounter: Payer: Self-pay | Admitting: Family Medicine

## 2017-06-16 ENCOUNTER — Encounter: Payer: Self-pay | Admitting: Family Medicine

## 2017-06-17 LAB — CBC WITH DIFFERENTIAL/PLATELET
BASOS PCT: 0.6 %
Basophils Absolute: 30 cells/uL (ref 0–200)
Eosinophils Absolute: 160 cells/uL (ref 15–500)
Eosinophils Relative: 3.2 %
HCT: 31.3 % — ABNORMAL LOW (ref 35.0–45.0)
HEMOGLOBIN: 10.2 g/dL — AB (ref 11.7–15.5)
Lymphs Abs: 1505 cells/uL (ref 850–3900)
MCH: 26 pg — AB (ref 27.0–33.0)
MCHC: 32.6 g/dL (ref 32.0–36.0)
MCV: 79.6 fL — ABNORMAL LOW (ref 80.0–100.0)
MONOS PCT: 8.1 %
MPV: 12.5 fL (ref 7.5–12.5)
NEUTROS ABS: 2900 {cells}/uL (ref 1500–7800)
Neutrophils Relative %: 58 %
Platelets: 212 10*3/uL (ref 140–400)
RBC: 3.93 10*6/uL (ref 3.80–5.10)
RDW: 14.7 % (ref 11.0–15.0)
Total Lymphocyte: 30.1 %
WBC mixed population: 405 cells/uL (ref 200–950)
WBC: 5 10*3/uL (ref 3.8–10.8)

## 2017-06-17 LAB — LIPID PANEL
Cholesterol: 116 mg/dL (ref <200)
HDL: 27 mg/dL — ABNORMAL LOW (ref >50)
LDL Cholesterol (Calc): 63 mg/dL (calc)
NON-HDL CHOLESTEROL (CALC): 89 mg/dL (ref <130)
TRIGLYCERIDES: 182 mg/dL — AB (ref <150)
Total CHOL/HDL Ratio: 4.3 (calc) (ref <5.0)

## 2017-06-17 LAB — COMPLETE METABOLIC PANEL WITH GFR
AG RATIO: 1.6 (calc) (ref 1.0–2.5)
ALBUMIN MSPROF: 4.2 g/dL (ref 3.6–5.1)
ALT: 21 U/L (ref 6–29)
AST: 27 U/L (ref 10–35)
Alkaline phosphatase (APISO): 68 U/L (ref 33–130)
BUN/Creatinine Ratio: 17 (calc) (ref 6–22)
BUN: 19 mg/dL (ref 7–25)
CALCIUM: 9.3 mg/dL (ref 8.6–10.4)
CO2: 28 mmol/L (ref 20–32)
Chloride: 105 mmol/L (ref 98–110)
Creat: 1.09 mg/dL — ABNORMAL HIGH (ref 0.60–0.93)
GFR, EST AFRICAN AMERICAN: 58 mL/min/{1.73_m2} — AB (ref >=60)
GFR, EST NON AFRICAN AMERICAN: 50 mL/min/{1.73_m2} — AB (ref >=60)
Globulin: 2.7 g/dL (calc) (ref 1.9–3.7)
Glucose, Bld: 117 mg/dL — ABNORMAL HIGH (ref 65–99)
POTASSIUM: 4.2 mmol/L (ref 3.5–5.3)
Sodium: 141 mmol/L (ref 135–146)
TOTAL PROTEIN: 6.9 g/dL (ref 6.1–8.1)
Total Bilirubin: 0.4 mg/dL (ref 0.2–1.2)

## 2017-06-17 LAB — HEMOGLOBIN A1C
EAG (MMOL/L): 9.2 (calc)
Hgb A1c MFr Bld: 7.4 % of total Hgb — ABNORMAL HIGH (ref <5.7)
Mean Plasma Glucose: 166 (calc)

## 2017-06-17 LAB — VITAMIN B12: VITAMIN B 12: 913 pg/mL (ref 200–1100)

## 2017-06-17 LAB — TSH: TSH: 1.95 m[IU]/L (ref 0.40–4.50)

## 2017-06-20 ENCOUNTER — Telehealth: Payer: Self-pay | Admitting: Family Medicine

## 2017-06-20 DIAGNOSIS — D649 Anemia, unspecified: Secondary | ICD-10-CM

## 2017-06-20 NOTE — Telephone Encounter (Signed)
Please call patient and advise that her labs came back and she is anemic. Her hemoglobin has dropped from 12 about a year ago to 10. It does appear that she is iron deficient, by looking at her blood count results. I have only seen her once, but from looking at the chart, it does not appear that she has had a colonoscopy. I am concerned that she could be bleeding from her GI tract. Would she be willing to see the gastroenterologist for evaluation. We need to sort out why she is anemic. This is a change from her previous blood work. Advise her that  I have placed the referral for GI, but she also needs to follow up with me.

## 2017-06-21 NOTE — Telephone Encounter (Signed)
Spoke with patient and gave results of recent labwork. Advised her Dr.Hagler wants her to see a gastroenterologist, however, patient is refusing to see the. She states she is on a blood thinner and bleeds a lot. When questioned further, she states she takes 4 shots a day and sometimes she bleeds from those and all she had to do is bump her hand on the wall and she will bleed from it and if she had a sore and hits it, she will bleed from that so she has to be really careful. She stated they just have too much on there plate with all the things going on with her husband. I told her I would let Dr.Hagler know.

## 2017-06-21 NOTE — Telephone Encounter (Signed)
Please call patient again and advise that I would still recommend that she see a GI doctor that she could be bleeding in Jordan colon. Advise Jordan that she would not have dropped from a hemoglobin of 12 to 10 due to some small bleeding from insulin shots or from bumping Jordan leg. Please advise to follow up to discuss Jordan labs. Please send Jordan a certified letter stating that It is recommended that she follow up to discuss Jordan labs and it is recommended she see a gastroenterologist for evaluation due to Jordan anemia. Ellen Jordan. Mannie Stabile, MD

## 2017-06-22 NOTE — Telephone Encounter (Signed)
Called patient and spoke with her again. Again told her it is highly recommended she follow up with gastro due to the drop in her hemoglobin. Again, she stated she would not go. Patient said her husband has cancer, and she just can't do it financially or mentally.She can't handle anything else on her plate at this point. I voiced my understanding. I let her know she should come in to follow up on labs and she said at this time she can't even commit to making a follow up appointment.

## 2017-06-23 ENCOUNTER — Ambulatory Visit: Payer: Medicare HMO | Admitting: "Endocrinology

## 2017-06-23 ENCOUNTER — Encounter: Payer: Self-pay | Admitting: "Endocrinology

## 2017-06-23 VITALS — BP 114/65 | HR 66 | Ht 63.0 in | Wt 171.0 lb

## 2017-06-23 DIAGNOSIS — E782 Mixed hyperlipidemia: Secondary | ICD-10-CM

## 2017-06-23 DIAGNOSIS — Z794 Long term (current) use of insulin: Secondary | ICD-10-CM | POA: Diagnosis not present

## 2017-06-23 DIAGNOSIS — I10 Essential (primary) hypertension: Secondary | ICD-10-CM

## 2017-06-23 DIAGNOSIS — E1165 Type 2 diabetes mellitus with hyperglycemia: Secondary | ICD-10-CM | POA: Diagnosis not present

## 2017-06-23 DIAGNOSIS — IMO0001 Reserved for inherently not codable concepts without codable children: Secondary | ICD-10-CM

## 2017-06-23 NOTE — Progress Notes (Signed)
Subjective:    Patient ID: Ellen Jordan, female    DOB: [birth year], PCP Caren Macadam, MD   Past Medical History:  Diagnosis Date  . Allergic rhinitis   . Allergy   . Anxiety   . Cancer ([place])    uterine  . Carpal tunnel syndrome   . Cataract   . Depression   . Diabetic neuropathy ([place])   . Essential hypertension   . GERD (gastroesophageal reflux disease)   . Mixed hyperlipidemia   . PAF (paroxysmal atrial fibrillation) ([place])    a. s/p TEE cardioversion March 2017. b. Recurrence by event monitor 04/2016.  Marland Kitchen RBBB   . Restless leg syndrome   . Type 2 diabetes mellitus ([place])    Past Surgical History:  Procedure Laterality Date  . ABDOMINAL HYSTERECTOMY     uterine cancer  . BACK SURGERY    . CARDIOVERSION N/A 03/11/2015   Procedure: CARDIOVERSION;  Surgeon: [name], MD;  Location: [facility];  Service: Cardiovascular;  Laterality: N/A;  . CHOLECYSTECTOMY    . EYE SURGERY     cataracts  . SPINE SURGERY    . TEE WITHOUT CARDIOVERSION N/A 03/11/2015   Procedure: TRANSESOPHAGEAL ECHOCARDIOGRAM (TEE);  Surgeon: [name], MD;  Location: [facility] ENDOSCOPY;  Service: Cardiovascular;  Laterality: N/A;   Social History   Socioeconomic History  . Marital status: Married    Spouse name: earl  . Number of children: 2  . Years of education: 31  . Highest education level: Not on file  Occupational History  . Occupation: retired  Scientific laboratory technician  . Financial resource strain: Not on file  . Food insecurity:    Worry: Not on file    Inability: Not on file  . Transportation needs:    Medical: Not on file    Non-medical: Not on file  Tobacco Use  . Smoking status: Former Smoker    Packs/day: 1.00    Years: 50.00    Pack years: 50.00    Types: Cigarettes    Start date: 01/10/1961    Last attempt to quit: 01/10/2005    Years since quitting: 12.4  . Smokeless tobacco: Former Systems developer    Quit date: 01/10/2005  Substance and Sexual Activity  . Alcohol use: No   Alcohol/week: 0.0 oz  . Drug use: No  . Sexual activity: Not Currently    Partners: Male  Lifestyle  . Physical activity:    Days per week: Not on file    Minutes per session: Not on file  . Stress: Not on file  Relationships  . Social connections:    Talks on phone: Not on file    Gets together: Not on file    Attends religious service: Not on file    Active member of club or organization: Not on file    Attends meetings of clubs or organizations: Not on file    Relationship status: Not on file  Other Topics Concern  . Not on file  Social History Narrative   Moved to this area in 57 from [place].    Married for over 50 years. Has two children. Lives with husband [name] (earl)   Likes to read   Retired from [employer].   Enjoys time with family, beach, read, exericse.   Walk dog.   Eats all food groups.   Wear seatbelt.   Wear sunscreen.   Outpatient Encounter Medications as of 06/23/2017  Medication Sig  . ACCU-CHEK FASTCLIX LANCETS  MISC CHECK BLOOD GLUCOSE FOUR TIMES DAILY  . ACCU-CHEK SMARTVIEW test strip CHECK BLOOD GLUCOSE FOUR TIMES DAILY  . albuterol (PROVENTIL HFA;VENTOLIN HFA) 108 (90 Base) MCG/ACT inhaler Inhale 2 puffs into the lungs every 6 (six) hours as needed for wheezing or shortness of breath.  . Alcohol Swabs (B-D SINGLE USE SWABS REGULAR) PADS USE FOUR TIMES DAILY  . apixaban (ELIQUIS) 5 MG TABS tablet Take 1 tablet (5 mg total) by mouth 2 (two) times daily.  Marland Kitchen atenolol (TENORMIN) 50 MG tablet TAKE 1 TABLET (50 MG TOTAL) BY MOUTH DAILY.  Marland Kitchen atorvastatin (LIPITOR) 40 MG tablet TAKE 1 TABLET EVERY DAY  . b complex vitamins tablet Take 1 tablet by mouth daily.  . benzonatate (TESSALON) 200 MG capsule Take 1 capsule (200 mg total) by mouth 2 (two) times daily as needed for cough.  . Biotin 5000 MCG CAPS Take 1 capsule by mouth daily.  . Blood Glucose Monitoring Suppl (ACCU-CHEK GUIDE) w/Device KIT 1 each by Does not apply route 4 (four) times daily.  .  Calcium-Phosphorus-Vitamin D (CALCIUM GUMMIES PO) Take 500-1,000 mg by mouth daily.  . chlorpheniramine (EQ CHLORTABS) 4 MG tablet Take 4 mg by mouth 2 (two) times daily as needed for allergies.  . cholecalciferol (VITAMIN D) 1000 units tablet Take 1,000 Units by mouth daily.  Marland Kitchen diltiazem (DILTIAZEM CD) 120 MG 24 hr capsule Take 1 capsule (120 mg total) by mouth daily.  . diphenhydrAMINE (BENADRYL) 25 MG tablet Take 25 mg by mouth daily.  . flecainide (TAMBOCOR) 50 MG tablet Take 1 tablet (50 mg total) by mouth 2 (two) times daily.  . furosemide (LASIX) 40 MG tablet TAKE 1 TABLET (40 MG TOTAL) BY MOUTH DAILY AS NEEDED.  Marland Kitchen glucose blood (ACCU-CHEK GUIDE) test strip Use as instructed 4 x daily  . insulin aspart (NOVOLOG) 100 UNIT/ML injection Inject 10-16 Units into the skin 3 (three) times daily with meals.  . insulin detemir (LEVEMIR) 100 UNIT/ML injection Inject 0.5 mLs (50 Units total) into the skin at bedtime.  . liraglutide (VICTOZA) 18 MG/3ML SOPN Inject 0.3 mLs (1.8 mg total) into the skin daily.  Marland Kitchen lisinopril (PRINIVIL,ZESTRIL) 20 MG tablet TAKE 1 TABLET (20 MG TOTAL) BY MOUTH DAILY.  Marland Kitchen Misc Natural Products (ESTROVEN + ENERGY MAX STRENGTH) TABS Take 1 tablet by mouth daily.  . Multiple Vitamin (MULTIVITAMIN) tablet Take 1 tablet by mouth daily.  . Omega-3 Fatty Acids (FISH OIL) 1200 MG CAPS Take 1 capsule by mouth daily.  Marland Kitchen omeprazole (PRILOSEC) 20 MG capsule TAKE 1 CAPSULE (20 MG TOTAL) BY MOUTH DAILY.  Marland Kitchen sertraline (ZOLOFT) 100 MG tablet Take 1 tablet (100 mg total) by mouth daily.  . vitamin E (VITAMIN E) 400 UNIT capsule Take 400 Units by mouth daily.   No facility-administered encounter medications on file as of 06/23/2017.    ALLERGIES: Allergies  Allergen Reactions  . Penicillins Hives    Has patient had a PCN reaction causing immediate rash, facial/tongue/throat swelling, SOB or lightheadedness with hypotension: Yes Has patient had a PCN reaction causing severe rash involving  mucus membranes or skin necrosis: No Has patient had a PCN reaction that required hospitalization No Has patient had a PCN reaction occurring within the last 10 years: No If all of the above answers are "NO", then may proceed with Cephalosporin use.    VACCINATION STATUS: Immunization History  Administered Date(s) Administered  . Influenza,inj,Quad PF,6+ Mos 11/04/2016  . Pneumococcal Conjugate-13 11/30/2016  . Pneumococcal Polysaccharide-23 06/07/2017  .  Td 06/07/2017    Diabetes  She presents for her follow-up diabetic visit. She has type 2 diabetes mellitus. Onset time: She was diagnosed at approximate age of 71 years. Her disease course has been stable. There are no hypoglycemic associated symptoms. Pertinent negatives for hypoglycemia include no confusion, headaches, pallor or seizures. There are no diabetic associated symptoms. Pertinent negatives for diabetes include no chest pain, no polydipsia, no polyphagia and no polyuria. There are no hypoglycemic complications. Symptoms are stable. There are no diabetic complications. Risk factors for coronary artery disease include diabetes mellitus, dyslipidemia, hypertension, obesity, sedentary lifestyle and tobacco exposure. Current diabetic treatment includes intensive insulin program. She is compliant with treatment most of the time. Her weight is stable. She is following a generally unhealthy diet. She has not had a previous visit with a dietitian (She declined referral to CDE.). Her home blood glucose trend is decreasing steadily. Her breakfast blood glucose range is generally 140-180 mg/dl. Her lunch blood glucose range is generally 140-180 mg/dl. Her dinner blood glucose range is generally 140-180 mg/dl. Her overall blood glucose range is 140-180 mg/dl. An ACE inhibitor/angiotensin II receptor blocker is being taken. She does not see a podiatrist.Eye exam is not current (She is overdue for eye exam, I advised her to schedule a visit to her eye  doctor.).  Hyperlipidemia  This is a chronic problem. The current episode started more than 1 year ago. The problem is controlled. Exacerbating diseases include diabetes and obesity. Pertinent negatives include no chest pain, leg pain, myalgias or shortness of breath. Current antihyperlipidemic treatment includes statins. Risk factors for coronary artery disease include dyslipidemia, diabetes mellitus, hypertension, a sedentary lifestyle and obesity.  Hypertension  This is a chronic problem. The current episode started more than 1 year ago. The problem is controlled. Pertinent negatives include no chest pain, headaches, palpitations or shortness of breath. Risk factors for coronary artery disease include diabetes mellitus, dyslipidemia, obesity, sedentary lifestyle and smoking/tobacco exposure. Past treatments include ACE inhibitors. The current treatment provides moderate improvement.     Review of Systems  Constitutional: Negative for unexpected weight change.  HENT: Negative for trouble swallowing and voice change.   Eyes: Negative for visual disturbance.  Respiratory: Negative for cough, shortness of breath and wheezing.   Cardiovascular: Negative for chest pain, palpitations and leg swelling.  Gastrointestinal: Negative for diarrhea, nausea and vomiting.  Endocrine: Negative for cold intolerance, heat intolerance, polydipsia, polyphagia and polyuria.  Musculoskeletal: Negative for arthralgias and myalgias.  Skin: Negative for color change, pallor, rash and wound.  Neurological: Negative for seizures and headaches.  Psychiatric/Behavioral: Negative for confusion and suicidal ideas.    Objective:    BP 114/65   Pulse 66   Ht _0  (1.6 m)   Wt 171 lb (77.6 kg)   BMI 30.29 kg/m   Wt Readings from Last 3 Encounters:  06/23/17 171 lb (77.6 kg)  06/07/17 170 lb 1.3 oz (77.1 kg)  04/24/17 169 lb (76.7 kg)    Physical Exam  Constitutional: She is oriented to person, place, and time.  She appears well-developed.  HENT:  Head: Normocephalic and atraumatic.  Eyes: EOM are normal.  Neck: Normal range of motion. Neck supple. No tracheal deviation present. No thyromegaly present.  Pulmonary/Chest: Effort normal.  Abdominal: There is no tenderness. There is no guarding.  Musculoskeletal: Normal range of motion. She exhibits no edema.  Neurological: She is alert and oriented to person, place, and time. No cranial nerve deficit. Coordination normal.  Skin: Skin is warm and dry. No rash noted. No erythema. No pallor.  Psychiatric: She has a normal mood and affect. Judgment normal.    Results for orders placed or performed in visit on 06/07/17  CBC with Differential/Platelet  Result Value Ref Range   WBC 5.0 3.8 - 10.8 Thousand/uL   RBC 3.93 3.80 - 5.10 Million/uL   Hemoglobin 10.2 (L) 11.7 - 15.5 g/dL   HCT 31.3 (L) 35.0 - 45.0 %   MCV 79.6 (L) 80.0 - 100.0 fL   MCH 26.0 (L) 27.0 - 33.0 pg   MCHC 32.6 32.0 - 36.0 g/dL   RDW 14.7 11.0 - 15.0 %   Platelets 212 140 - 400 Thousand/uL   MPV 12.5 7.5 - 12.5 fL   Neutro Abs 2,900 1,500 - 7,800 cells/uL   Lymphs Abs 1,505 850 - 3,900 cells/uL   WBC mixed population 405 200 - 950 cells/uL   Eosinophils Absolute 160 15 - 500 cells/uL   Basophils Absolute 30 0 - 200 cells/uL   Neutrophils Relative % 58 %   Total Lymphocyte 30.1 %   Monocytes Relative 8.1 %   Eosinophils Relative 3.2 %   Basophils Relative 0.6 %  Vitamin B12  Result Value Ref Range   Vitamin B-12 913 200 - 1,100 pg/mL  TSH  Result Value Ref Range   TSH 1.95 0.40 - 4.50 mIU/L   Lipid Panel     Component Value Date/Time   CHOL 116 06/16/2017 0959   TRIG 182 (H) 06/16/2017 0959   HDL 27 (L) 06/16/2017 0959   CHOLHDL 4.3 06/16/2017 0959   VLDL 49 (H) 03/30/2016 1206   LDLCALC 63 06/16/2017 0959     Assessment & Plan:   1. Uncontrolled type 2 diabetes mellitus without complication, with long-term current use of insulin   - She remains at a high  risk for more acute and chronic complications of diabetes which include CAD, CVA, CKD, retinopathy, and neuropathy. These are all discussed in detail with the patient.  Patient came with near target glucose profile, and  recent A1c  Stable at 7.3% ,  overall improving from 14.6 %.  Glucose logs and insulin administration records pertaining to this visit,  to be scanned into patient's records.  Recent labs reviewed.   - I have re-counseled the patient on diet management  by adopting a carbohydrate restricted / protein rich  Diet.  -  Suggestion is made for her to avoid simple carbohydrates  from her diet including Cakes, Sweet Desserts / Pastries, Ice Cream, Soda (diet and regular), Sweet Tea, Candies, Chips, Cookies, Store Bought Juices, Alcohol in Excess of  1-2 drinks a day, Artificial Sweeteners, and "Sugar-free" Products. This will help patient to have stable blood glucose profile and potentially avoid unintended weight gain.   - Patient is advised to stick to a routine mealtimes to eat 3 meals  a day and avoid unnecessary snacks ( to snack only to correct hypoglycemia).  - The patient  declined a referral to a  CDE for individualized DM education.  - I have approached patient with the following individualized plan to manage diabetes and patient agrees.  -Based on her stable glycemic profile, I advised her to continue on her current dosing regimen of insulin.  -I advised her to continue Levemir 50 units nightly, continue NovoLog  10 units units   3 times a day before meals  , plus correction dose associated with monitoring of blood glucose 4 times a  day-before meals and at bedtime.  -She is tolerating Victoza.  I advised her to continue Victoza at 1.8 mg subcutaneously daily .  - She would have benefited from continuous glucose monitoring.  Her insurance did not provide adequate coverage.    - Patient specific target  for A1c; LDL, HDL, Triglycerides, and  Waist Circumference were discussed  in detail.  2) BP/HTN: Her blood pressure is controlled to target.  She is advised to continue her current medications including lisinopril 20 mg p.o. daily.    3) Lipids/HPL: Recent lipid panel revealed controlled LDL at 63. She is advised to continue atorvastatin 40 mg p.o. nightly.   4)  Weight/Diet:  declined CDE consult, exercise, and carbohydrates information provided.  5) Chronic Care/Health Maintenance:  -Patient is on ACEI/ARB and Statin medications and encouraged to continue to follow up with Ophthalmology (she is overdue for her eye exam), Podiatrist at least yearly or according to recommendations, and advised to  stay away from smoking. I have recommended yearly flu vaccine and pneumonia vaccination at least every 5 years; moderate intensity exercise for up to 150 minutes weekly; and  sleep for at least 7 hours a day.  - I advised patient to maintain close follow up with Caren Macadam, MD for primary care needs.  - Time spent with the patient: 25 min, of which >50% was spent in reviewing her blood glucose logs , discussing her hypo- and hyper-glycemic episodes, reviewing her current and  previous labs and insulin doses and developing a plan to avoid hypo- and hyper-glycemia. Please refer to Patient Instructions for Blood Glucose Monitoring and Insulin/Medications Dosing Guide"  in media tab for additional information. Ellen Jordan participated in the discussions, expressed understanding, and voiced agreement with the above plans.  All questions were answered to her satisfaction. she is encouraged to contact clinic should she have any questions or concerns prior to her return visit.   Follow up plan: -Return in about 6 months (around 12/23/2017) for follow up with pre-visit labs, meter, and logs.  Glade Lloyd, MD Phone: [phone]  Fax: [phone]   -  This note was partially dictated with voice recognition software. Similar sounding words can be transcribed inadequately or  may not  be corrected upon review.  06/23/2017, 12:27 PM

## 2017-06-23 NOTE — Patient Instructions (Signed)

## 2017-06-26 ENCOUNTER — Encounter: Payer: Self-pay | Admitting: Internal Medicine

## 2017-07-21 ENCOUNTER — Other Ambulatory Visit: Payer: Self-pay | Admitting: Family Medicine

## 2017-07-21 ENCOUNTER — Other Ambulatory Visit: Payer: Self-pay | Admitting: "Endocrinology

## 2017-08-03 ENCOUNTER — Ambulatory Visit (HOSPITAL_COMMUNITY)
Admission: RE | Admit: 2017-08-03 | Discharge: 2017-08-03 | Disposition: A | Discharge status: Home / Self Care | Payer: Medicare HMO | Source: Ambulatory Visit | Attending: Surgery | Admitting: Surgery

## 2017-08-03 ENCOUNTER — Ambulatory Visit: Payer: Medicare HMO | Admitting: Family

## 2017-08-03 ENCOUNTER — Other Ambulatory Visit: Payer: Self-pay

## 2017-08-03 ENCOUNTER — Encounter: Payer: Self-pay | Admitting: Family

## 2017-08-03 VITALS — BP 131/71 | HR 66 | Temp 97.2°F | Resp 16 | Ht 63.5 in | Wt 169.6 lb

## 2017-08-03 DIAGNOSIS — I6523 Occlusion and stenosis of bilateral carotid arteries: Secondary | ICD-10-CM | POA: Insufficient documentation

## 2017-08-03 DIAGNOSIS — Z87891 Personal history of nicotine dependence: Secondary | ICD-10-CM

## 2017-08-03 NOTE — Patient Instructions (Signed)
Stroke Prevention Some health problems and behaviors may make it more likely for you to have a stroke. Below are ways to lessen your risk of having a stroke.  Be active for at least 30 minutes on most or all days.  Do not smoke. Try not to be around others who smoke.  Do not drink too much alcohol. ? Do not have more than 2 drinks a day if you are a man. ? Do not have more than 1 drink a day if you are a woman and are not pregnant.  Eat healthy foods, such as fruits and vegetables. If you were put on a specific diet, follow the diet as told.  Keep your cholesterol levels under control through diet and medicines. Look for foods that are low in saturated fat, trans fat, cholesterol, and are high in fiber.  If you have diabetes, follow all diet plans and take your medicine as told.  Ask your doctor if you need treatment to lower your blood pressure. If you have high blood pressure (hypertension), follow all diet plans and take your medicine as told by your doctor.  If you are 67-36 years old, have your blood pressure checked every 3-5 years. If you are age 33 or older, have your blood pressure checked every year.  Keep a healthy weight. Eat foods that are low in calories, salt, saturated fat, trans fat, and cholesterol.  Do not take drugs.  Avoid birth control pills, if this applies. Talk to your doctor about the risks of taking birth control pills.  Talk to your doctor if you have sleep problems (sleep apnea).  Take all medicine as told by your doctor. ? You may be told to take aspirin or blood thinner medicine. Take this medicine as told by your doctor. ? Understand your medicine instructions.  Make sure any other conditions you have are being taken care of.  Get help right away if:  You suddenly lose feeling (you feel numb) or have weakness in your face, arm, or leg.  Your face or eyelid hangs down to one side.  You suddenly feel confused.  You have trouble talking  (aphasia) or understanding what people are saying.  You suddenly have trouble seeing in one or both eyes.  You suddenly have trouble walking.  You are dizzy.  You lose your balance or your movements are clumsy (uncoordinated).  You suddenly have a very bad headache and you do not know the cause.  You have new chest pain.  Your heart feels like it is fluttering or skipping a beat (irregular heartbeat). Do not wait to see if the symptoms above go away. Get help right away. Call your local emergency services (911 in [place] Do not drive yourself to the hospital. This information is not intended to replace advice given to you by your health care provider. Make sure you discuss any questions you have with your health care provider. Document Released: 06/28/2011 Document Revised: 06/04/2015 Document Reviewed: 06/29/2012 Elsevier Interactive Patient Education  [employer].

## 2017-08-03 NOTE — Progress Notes (Signed)
Chief Complaint: Follow up Extracranial Carotid Artery Stenosis   History of Present Illness  Ellen Jordan is a 72 y.o. female whom Dr. Trula Slade saw on initial evaluation on 08-03-16 for.carotid disease.  She had recently had a duplex which showed 60-79% bilateral carotid stenosis.  The patient is asymptomatic.  Specifically, she denies numbness or weakness in either extremity.  She denies slurred speech.  She denies amaurosis fugax.  At her 08-03-16 visit Dr. Trula Slade indicated pt had asymptomatic bilateral carotid stenosis; discussed that as long as the patient remained asymptomatic, intervention would be reserved for stenosis greater than 80%. He discussed the signs and symptoms of a TIA/stroke and what to do should they occur.  At that point, she should continue with maximal medical therapy which she is currently on with the exception of a baby aspirin. Dr. Trula Slade would like for her to start taking a 1 mg aspirin.  She was to discuss this further with her primary care physician but will likely restart this. Pt was to follow-up in one year with repeat carotid duplex.  Patient suffers from type 2 diabetes which was well controlled.  She is on anticoagulation for paroxysmal atrial fibrillation.  She is medically managed for hypertension with an ACE inhibitorn.  She takes a statin for hypercholesterolemia.    She denies any known history of stroke or TIA. Specifically she denies a history of amaurosis fugax or monocular blindness, unilateral facial drooping, hemiplegia, or receptive or expressive aphasia.    Her walking is limited by back and hip pain, and dyspnea, does not seem to claudicate.  She has had lumbar spine surgery in the past.    Diabetic: yes, last A1C result was 7.4, uncontrolled  Tobacco use: former smoker, quit in 2008, smoked x 40 years  Pt meds include: Statin : yes ASA: no Other anticoagulants/antiplatelets: Eliquis for hx of atrial fib   Past Medical History:   Diagnosis Date  . Allergic rhinitis   . Allergy   . Anxiety   . Cancer ([place])    uterine  . Carpal tunnel syndrome   . Cataract   . Depression   . Diabetic neuropathy ([place])   . Essential hypertension   . GERD (gastroesophageal reflux disease)   . Mixed hyperlipidemia   . PAF (paroxysmal atrial fibrillation) ([place])    a. s/p TEE cardioversion March 2017. b. Recurrence by event monitor 04/2016.  Marland Kitchen RBBB   . Restless leg syndrome   . Type 2 diabetes mellitus ([place])     Social History Social History   Tobacco Use  . Smoking status: Former Smoker    Packs/day: 1.00    Years: 50.00    Pack years: 50.00    Types: Cigarettes    Start date: 01/10/1961    Last attempt to quit: 01/10/2005    Years since quitting: 12.5  . Smokeless tobacco: Former Systems developer    Quit date: 01/10/2005  Substance Use Topics  . Alcohol use: No    Alcohol/week: 0.0 oz  . Drug use: No    Family History Family History  Problem Relation Age of Onset  . Heart disease Mother 62  . Hyperlipidemia Mother   . Diabetes Brother   . COPD Father 70       emphysema  . Hypertension Father   . Hyperlipidemia Father   . Diabetes Daughter   . AAA (abdominal aortic aneurysm) Son   . Diabetes Brother   . Diabetes Brother   . Diabetes Brother  Surgical History Past Surgical History:  Procedure Laterality Date  . ABDOMINAL HYSTERECTOMY     uterine cancer  . BACK SURGERY    . CARDIOVERSION N/A 03/11/2015   Procedure: CARDIOVERSION;  Surgeon: [name], MD;  Location: [facility];  Service: Cardiovascular;  Laterality: N/A;  . CHOLECYSTECTOMY    . EYE SURGERY     cataracts  . SPINE SURGERY    . TEE WITHOUT CARDIOVERSION N/A 03/11/2015   Procedure: TRANSESOPHAGEAL ECHOCARDIOGRAM (TEE);  Surgeon: [name], MD;  [facility] END[facility];  Service: Cardiovascular;  Laterality: N/A;    Allergies  Allergen Reactions  . Penicillins Hives    Has patient had a PCN reaction causing immediate rash,  facial/tongue/throat swelling, SOB or lightheadedness with hypotension: Yes Has patient had a PCN reaction causing severe rash involving mucus membranes or skin necrosis: No Has patient had a PCN reaction that required hospitalization No Has patient had a PCN reaction occurring within the last 10 years: No If all of the above answers are "NO", then may proceed with Cephalosporin use.     Current Outpatient Medications  Medication Sig Dispense Refill  . ACCU-CHEK FASTCLIX LANCETS MISC CHECK BLOOD GLUCOSE FOUR TIMES DAILY 408 each 2  . ACCU-CHEK GUIDE test strip USE AS INSTRUCTED FOUR TIMES DAILY 400 each 1  . ACCU-CHEK SMARTVIEW test strip CHECK BLOOD GLUCOSE FOUR TIMES DAILY 400 each 2  . albuterol (PROVENTIL HFA;VENTOLIN HFA) 108 (90 Base) MCG/ACT inhaler Inhale 2 puffs into the lungs every 6 (six) hours as needed for wheezing or shortness of breath. 1 Inhaler 0  . Alcohol Swabs (B-D SINGLE USE SWABS REGULAR) PADS USE FOUR TIMES DAILY 400 each 2  . apixaban (ELIQUIS) 5 MG TABS tablet Take 1 tablet (5 mg total) by mouth 2 (two) times daily. 180 tablet 3  . atenolol (TENORMIN) 50 MG tablet TAKE 1 TABLET EVERY DAY 90 tablet 0  . atorvastatin (LIPITOR) 40 MG tablet TAKE 1 TABLET EVERY DAY 90 tablet 3  . b complex vitamins tablet Take 1 tablet by mouth daily.    . benzonatate (TESSALON) 200 MG capsule Take 1 capsule (200 mg total) by mouth 2 (two) times daily as needed for cough. (Patient not taking: Reported on 08/03/2017) 20 capsule 0  . Biotin 5000 MCG CAPS Take 1 capsule by mouth daily.    . Blood Glucose Monitoring Suppl (ACCU-CHEK GUIDE) w/Device KIT 1 each by Does not apply route 4 (four) times daily. 1 kit 0  . Calcium-Phosphorus-Vitamin D (CALCIUM GUMMIES PO) Take 500-1,000 mg by mouth daily.    . chlorpheniramine (EQ CHLORTABS) 4 MG tablet Take 4 mg by mouth 2 (two) times daily as needed for allergies.    . cholecalciferol (VITAMIN D) 1000 units tablet Take 1,000 Units by mouth daily.     Marland Kitchen diltiazem (DILTIAZEM CD) 120 MG 24 hr capsule Take 1 capsule (120 mg total) by mouth daily. 90 capsule 3  . diphenhydrAMINE (BENADRYL) 25 MG tablet Take 25 mg by mouth daily.    . flecainide (TAMBOCOR) 50 MG tablet Take 1 tablet (50 mg total) by mouth 2 (two) times daily. 180 tablet 3  . furosemide (LASIX) 40 MG tablet TAKE 1 TABLET EVERY DAY AS NEEDED 30 tablet 0  . glucose blood (ACCU-CHEK GUIDE) test strip Use as instructed 4 x daily 400 each 1  . insulin aspart (NOVOLOG) 100 UNIT/ML injection Inject 10-16 Units into the skin 3 (three) times daily with meals. 50 mL 1  .  insulin detemir (LEVEMIR) 100 UNIT/ML injection Inject 0.5 mLs (50 Units total) into the skin at bedtime. 60 mL 1  . liraglutide (VICTOZA) 18 MG/3ML SOPN Inject 0.3 mLs (1.8 mg total) into the skin daily. 27 mL 1  . lisinopril (PRINIVIL,ZESTRIL) 20 MG tablet TAKE 1 TABLET (20 MG TOTAL) BY MOUTH DAILY. 90 tablet 0  . Misc Natural Products (ESTROVEN + ENERGY MAX STRENGTH) TABS Take 1 tablet by mouth daily.    . Multiple Vitamin (MULTIVITAMIN) tablet Take 1 tablet by mouth daily.    . Omega-3 Fatty Acids (FISH OIL) 1200 MG CAPS Take 1 capsule by mouth daily.    Marland Kitchen omeprazole (PRILOSEC) 20 MG capsule TAKE 1 CAPSULE (20 MG TOTAL) BY MOUTH DAILY. 90 capsule 1  . sertraline (ZOLOFT) 100 MG tablet Take 1 tablet (100 mg total) by mouth daily. 90 tablet 0  . vitamin E (VITAMIN E) 400 UNIT capsule Take 400 Units by mouth daily.     No current facility-administered medications for this visit.     Review of Systems : See HPI for pertinent positives and negatives.  Physical Examination  Vitals:   08/03/17 1204 08/03/17 1205  BP: 133/65 131/71  Pulse: 66   Resp: 16   Temp: (!) 97.2 F (36.2 C)   TempSrc: Oral   SpO2: 95%   Weight: 169 lb 9.6 oz (76.9 kg)   Height: 5' 3.5" (1.613 m)    Body mass index is 29.57 kg/m.  General: WDWN female in NAD GAIT: normal Eyes: PERRLA HENT: No gross abnormalities.  Pulmonary:   Respirations are non-labored, good air movement in all fields, CTAB, no rales, rhonchi, or wheezing. Cardiac: IrreguIar rhythm, alternating with regular rhythm, no detected murmur.  VASCULAR EXAM Carotid Bruits Right Left   Negative Negative     Abdominal aortic pulse is not palpable. Radial pulses are 2+ palpable and equal.                                                                                                                            LE Pulses Right Left       POPLITEAL  not palpable   not palpable       POSTERIOR TIBIAL   palpable    palpable        DORSALIS PEDIS      ANTERIOR TIBIAL  palpable   palpable     Gastrointestinal: soft, nontender, BS WNL, no r/g, no palpable masses. Musculoskeletal: no muscle atrophy/wasting. M/S 5/5 throughout, extremities without ischemic changes. Skin: No rashes, no ulcers, no cellulitis.   Neurologic:  A&O X 3; appropriate affect, sensation is normal; speech is normal, CN 2-12 intact, pain and light touch intact in extremities, motor exam as listed above. Psychiatric: Normal thought content, mood appropriate to clinical situation.    Assessment: Ellen Jordan is a 72 y.o. female who has no history of stroke or TIA.   Pt states she is in between PCP's,  has not seen Dr. Domenic Polite in a while (cardiologist). I advised her to call Dr. Dewayne Shorter office and let them know I heard irregular and sometimes regular cardiac rhythm, she continues to fatigue and get dyspneic easily. She states she has not been in atrial fib for a long while. She is taking Eliquis.   Pt is not taking a daily 81 mg ASA in addition to her Eliquis; Dr. Trula Slade advised adding the 81 mg daily ASA at her visit on 08-03-16.   DATA Carotid Duplex (08-03-17): Right ICA: 1-39% stenosis Left ICA: 40-59% stenosis, upper end of range. Bilateral ECA stenosis Bilateral vertebral artery flow is antegrade.  Bilateral subclavian artery waveforms are normal.  Previous duplex  from outlying facility demonstrated 50-69% bilateral ICA stenosis.    Plan: Follow-up in 1 year with Carotid Duplex scan.   I discussed in depth with the patient the nature of atherosclerosis, and emphasized the importance of maximal medical management including strict control of blood pressure, blood glucose, and lipid levels, obtaining regular exercise, and continued cessation of smoking.  The patient is aware that without maximal medical management the underlying atherosclerotic disease process will progress, limiting the benefit of any interventions. The patient was given information about stroke prevention and what symptoms should prompt the patient to seek immediate medical care. Thank you for allowing [place] to participate in this patient's care.  Clemon Chambers, RN, MSN, FNP-C [facility] Office: [phone]  Clinic Physician: Bridgett Larsson on call  08/03/17 12:15 PM

## 2017-08-28 ENCOUNTER — Other Ambulatory Visit: Payer: Self-pay

## 2017-08-28 MED ORDER — APIXABAN 5 MG PO TABS: 5.0000 mg | ORAL_TABLET | Freq: Two times a day (BID) | ORAL | 3 refills | Status: DC

## 2017-08-28 MED ORDER — DILTIAZEM HCL ER COATED BEADS 120 MG PO CP24: 120.0000 mg | ORAL_CAPSULE | Freq: Every day | ORAL | 3 refills | Status: DC

## 2017-08-28 NOTE — Telephone Encounter (Signed)
Refilled eliquis and cardizem CD per fax request

## 2017-09-02 ENCOUNTER — Other Ambulatory Visit: Payer: Self-pay | Admitting: Family Medicine

## 2017-09-02 ENCOUNTER — Other Ambulatory Visit: Payer: Self-pay | Admitting: "Endocrinology

## 2017-09-02 ENCOUNTER — Other Ambulatory Visit: Payer: Self-pay | Admitting: Cardiology

## 2017-09-05 ENCOUNTER — Other Ambulatory Visit: Payer: Self-pay | Admitting: Cardiology

## 2017-09-21 ENCOUNTER — Ambulatory Visit: Payer: Medicare HMO | Admitting: Gastroenterology

## 2017-10-04 ENCOUNTER — Encounter: Payer: Self-pay | Admitting: Cardiology

## 2017-10-04 ENCOUNTER — Ambulatory Visit: Payer: Medicare HMO | Admitting: Cardiology

## 2017-10-04 VITALS — BP 118/62 | HR 108 | Ht 63.0 in | Wt 172.0 lb

## 2017-10-04 DIAGNOSIS — I10 Essential (primary) hypertension: Secondary | ICD-10-CM

## 2017-10-04 DIAGNOSIS — I480 Paroxysmal atrial fibrillation: Secondary | ICD-10-CM

## 2017-10-04 DIAGNOSIS — E782 Mixed hyperlipidemia: Secondary | ICD-10-CM | POA: Diagnosis not present

## 2017-10-04 DIAGNOSIS — I6523 Occlusion and stenosis of bilateral carotid arteries: Secondary | ICD-10-CM

## 2017-10-04 MED ORDER — FLECAINIDE ACETATE 100 MG PO TABS: 100.0000 mg | ORAL_TABLET | Freq: Two times a day (BID) | ORAL | 3 refills | Status: DC

## 2017-10-04 NOTE — Progress Notes (Signed)
Cardiology Office Note  Date: 10/04/2017   ID: Ellen, Jordan Sep 03, 1945, MRN [id number]  PCP: [name], MD  Primary Cardiologist: Rozann Lesches, MD   Chief Complaint  Patient presents with  . PAF    History of Present Illness: Ellen Jordan is a 72 y.o. female last seen in July 2018.  She is here today with her husband for a follow-up visit.  From a cardiac perspective, she does not report any definite sense of palpitations, no chest pain or dizziness.  She states that she is preparing to have eye surgery and also skin surgery, is under the impression that she will need to be off Eliquis for both of these procedures, we await further information.  I personally reviewed her ECG today which shows probable atypical atrial flutter versus ectopic atrial tachycardia with variable and 2:1 block, right bundle branch block.  She does not feel any palpitations at this time.  Her vital signs her heart rate at healthcare visit in July was in the 60s.  She has known paroxysmal atrial fibrillation.  I reviewed her medications, she reports compliance.  Cardiac regimen includes atenolol, flecainide, and Eliquis.  We discussed further advancing flecainide dose for better rhythm suppression.  She otherwise continues to follow with Dr. Nevada Crane for primary care.  Past Medical History:  Diagnosis Date  . Allergic rhinitis   . Allergy   . Anxiety   . Carpal tunnel syndrome   . Cataract   . Depression   . Diabetic neuropathy ([place])   . Essential hypertension   . GERD (gastroesophageal reflux disease)   . Mixed hyperlipidemia   . PAF (paroxysmal atrial fibrillation) ([place])    a. s/p TEE cardioversion March 2017. b. Recurrence by event monitor 04/2016.  Marland Kitchen RBBB   . Restless leg syndrome   . Type 2 diabetes mellitus ([place])   . Uterine cancer [facility])     Past Surgical History:  Procedure Laterality Date  . ABDOMINAL HYSTERECTOMY     uterine cancer  . BACK SURGERY    . CARDIOVERSION N/A  03/11/2015   Procedure: CARDIOVERSION;  Surgeon: [name], MD;  Location: [facility];  Service: Cardiovascular;  Laterality: N/A;  . CHOLECYSTECTOMY    . EYE SURGERY     cataracts  . SPINE SURGERY    . TEE WITHOUT CARDIOVERSION N/A 03/11/2015   Procedure: TRANSESOPHAGEAL ECHOCARDIOGRAM (TEE);  Surgeon: [name], MD;  Location: [facility] ENDOSCOPY;  Service: Cardiovascular;  Laterality: N/A;    Current Outpatient Medications  Medication Sig Dispense Refill  . ACCU-CHEK FASTCLIX LANCETS MISC CHECK BLOOD GLUCOSE FOUR TIMES DAILY 408 each 2  . ACCU-CHEK GUIDE test strip USE AS INSTRUCTED FOUR TIMES DAILY 400 each 1  . ACCU-CHEK SMARTVIEW test strip CHECK BLOOD GLUCOSE FOUR TIMES DAILY 400 each 2  . albuterol (PROVENTIL HFA;VENTOLIN HFA) 108 (90 Base) MCG/ACT inhaler Inhale 2 puffs into the lungs every 6 (six) hours as needed for wheezing or shortness of breath. 1 Inhaler 0  . Alcohol Swabs (B-D SINGLE USE SWABS REGULAR) PADS USE FOUR TIMES DAILY 400 each 2  . apixaban (ELIQUIS) 5 MG TABS tablet Take 1 tablet (5 mg total) by mouth 2 (two) times daily. 180 tablet 3  . atenolol (TENORMIN) 50 MG tablet TAKE 1 TABLET EVERY DAY 90 tablet 0  . atorvastatin (LIPITOR) 40 MG tablet TAKE 1 TABLET EVERY DAY 90 tablet 3  . b complex vitamins tablet Take 1 tablet by mouth daily.    Marland Kitchen  Biotin 5000 MCG CAPS Take 1 capsule by mouth daily.    . Blood Glucose Monitoring Suppl (ACCU-CHEK GUIDE) w/Device KIT 1 each by Does not apply route 4 (four) times daily. 1 kit 0  . Calcium-Phosphorus-Vitamin D (CALCIUM GUMMIES PO) Take 500-1,000 mg by mouth daily.    . chlorpheniramine (EQ CHLORTABS) 4 MG tablet Take 4 mg by mouth 2 (two) times daily as needed for allergies.    . cholecalciferol (VITAMIN D) 1000 units tablet Take 1,000 Units by mouth daily.    Marland Kitchen diltiazem (DILTIAZEM CD) 120 MG 24 hr capsule Take 1 capsule (120 mg total) by mouth daily. 90 capsule 3  . diphenhydrAMINE (BENADRYL) 25 MG tablet Take 25  mg by mouth daily.    . flecainide (TAMBOCOR) 50 MG tablet TAKE 1 TABLET TWICE DAILY 180 tablet 3  . furosemide (LASIX) 40 MG tablet TAKE 1 TABLET (40 MG TOTAL) BY MOUTH DAILY AS NEEDED. 90 tablet 0  . glucose blood (ACCU-CHEK GUIDE) test strip Use as instructed 4 x daily 400 each 1  . insulin aspart (NOVOLOG) 100 UNIT/ML injection Inject 10-16 Units into the skin 3 (three) times daily with meals. 50 mL 1  . insulin detemir (LEVEMIR) 100 UNIT/ML injection Inject 0.5 mLs (50 Units total) into the skin at bedtime. 60 mL 1  . liraglutide (VICTOZA) 18 MG/3ML SOPN Inject 0.3 mLs (1.8 mg total) into the skin daily. 27 mL 1  . lisinopril (PRINIVIL,ZESTRIL) 20 MG tablet TAKE 1 TABLET (20 MG TOTAL) BY MOUTH DAILY. 90 tablet 0  . Misc Natural Products (ESTROVEN + ENERGY MAX STRENGTH) TABS Take 1 tablet by mouth daily.    . Multiple Vitamin (MULTIVITAMIN) tablet Take 1 tablet by mouth daily.    . Omega-3 Fatty Acids (FISH OIL) 1200 MG CAPS Take 1 capsule by mouth daily.    Marland Kitchen omeprazole (PRILOSEC) 20 MG capsule TAKE 1 CAPSULE EVERY DAY 90 capsule 1  . sertraline (ZOLOFT) 100 MG tablet TAKE 1 TABLET (100 MG TOTAL) BY MOUTH DAILY. 90 tablet 0  . vitamin E (VITAMIN E) 400 UNIT capsule Take 400 Units by mouth daily.     No current facility-administered medications for this visit.    Allergies:  Penicillins   Social History: The patient  reports that she quit smoking about 12 years ago. Her smoking use included cigarettes. She started smoking about 56 years ago. She has a 50.00 pack-year smoking history. She quit smokeless tobacco use about 12 years ago. She reports that she does not drink alcohol or use drugs.   ROS:  Please see the history of present illness. Otherwise, complete review of systems is positive for trouble with memory.  All other systems are reviewed and negative.   Physical Exam: VS:  BP 118/62   Pulse (!) 108   Ht _0  (1.6 m)   Wt 172 lb (78 kg)   SpO2 98%   BMI 30.47 kg/m , BMI Body  mass index is 30.47 kg/m.  Wt Readings from Last 3 Encounters:  10/04/17 172 lb (78 kg)  08/03/17 169 lb 9.6 oz (76.9 kg)  06/23/17 171 lb (77.6 kg)    General: Patient appears comfortable at rest. HEENT: Conjunctiva and lids normal, oropharynx clear. Neck: Supple, no elevated JVP or carotid bruits, no thyromegaly. Lungs: Clear to auscultation, nonlabored breathing at rest. Cardiac: Irregular, no S3, 2/6 systolic murmur, no pericardial rub. Abdomen: Soft, nontender, bowel sounds present. Extremities: No pitting edema, distal pulses 2+. Skin: Warm and dry.  Musculoskeletal: No kyphosis. Neuropsychiatric: Alert and oriented x3, affect grossly appropriate.  ECG: I personally reviewed the tracing from 06/03/2016 which showed rapid atrial fibrillation with right bundle branch block.  Recent Labwork: 06/16/2017: ALT 21; AST 27; BUN 19; Creat 1.09; Hemoglobin 10.2; Platelets 212; Potassium 4.2; Sodium 141; TSH 1.95     Component Value Date/Time   CHOL 116 06/16/2017 0959   TRIG 182 (H) 06/16/2017 0959   HDL 27 (L) 06/16/2017 0959   CHOLHDL 4.3 06/16/2017 0959   VLDL 49 (H) 03/30/2016 1206   LDLCALC 63 06/16/2017 0959    Other Studies Reviewed Today:  [facility] Myoview 05/09/2016:  There was no ST segment deviation noted during stress.  The study is normal. No ischemia or scar.  This is a low risk study.  Nuclear stress EF: 78%.  TEE 03/11/2015: Study Conclusions  - Left ventricle: Systolic function was normal. The estimated ejection fraction was in the range of 50% to 55%. Wall motion was normal; there were no regional wall motion abnormalities. - Aortic valve: No evidence of vegetation. No evidence of vegetation. - Aorta: The aorta was normal, not dilated, there were moderate non-mobile calcifications. - Mitral valve: Mildly calcified annulus. Mildly thickened leaflets . There was mild regurgitation. - Left atrium: The atrium was dilated. No evidence of thrombus  in the atrial cavity or appendage. No evidence of thrombus in the atrial cavity or appendage. No evidence of thrombus in the atrial cavity or appendage. The appendage was morphologically a left appendage, multilobulated, and of normal size. Emptying velocity were decreased. - Right atrium: No evidence of thrombus in the atrial cavity or appendage. - Atrial septum: There was increased thickness of the septum, consistent with lipomatous hypertrophy.  Impressions:  - TEE did not identify any source of intracardiac thrmbus. A successful cardioversion followed the TEE.  Holter monitor 07/25/2016:  Sinus rhythm with occasional PAC's. No significant arrhythmias.  No diary was returned.  Carotid Dopplers 08/03/2017: Final Interpretation: Right Carotid: Velocities in the right ICA are consistent with a 1-39% stenosis.        The ECA appears >50% stenosed.  Left Carotid: Velocities in the left ICA are consistent with a 40-59% stenosis,       probable upper end of range.. The ECA appears >50% stenosed.  Assessment and Plan:  1.  Paroxysmal atrial fibrillation with CHADSVASC score of 4.  She is in what looks to be atypical atrial flutter today although is asymptomatic.  Plan is to continue Eliquis, atenolol, and Cardizem CD.  Increase flecainide to 100 mg twice daily with follow-up ECG in 2 weeks.  2.  Essential hypertension, blood pressure is well controlled today.  Keep follow-up with Dr. [place] Crane.  3.  Mixed hyperlipidemia, on Lipitor.  Keep follow-up with Dr. [place] Crane.  Recent LDL 63.  4.  Moderate carotid artery disease, following with [facility].  Carotid Dopplers obtained in July are outlined above.  Current medicines were reviewed with the patient today.   Orders Placed This Encounter  Procedures  . EKG 12-Lead    Disposition: Follow-up in 3 months.  Signed, Satira Sark, MD, [facility] 10/04/2017 1:59 PM    [facility] [address] [place], [place] [address] Phone: [phone]; Fax: [phone]

## 2017-10-04 NOTE — Patient Instructions (Addendum)
Your physician wants you to follow-up in:3 months  with Dr.McDowell    Return in 2 weeks for Nurse apt for EKG    INCREASE Flecainide to 100 mg twice a day     No lab work or tests ordered today     Thank you for choosing [facility] !

## 2017-10-10 ENCOUNTER — Telehealth: Payer: Self-pay | Admitting: Cardiology

## 2017-10-18 ENCOUNTER — Ambulatory Visit (INDEPENDENT_AMBULATORY_CARE_PROVIDER_SITE_OTHER): Payer: Medicare HMO

## 2017-10-18 VITALS — BP 122/72 | HR 100 | Wt 171.0 lb

## 2017-10-18 DIAGNOSIS — Z79899 Other long term (current) drug therapy: Secondary | ICD-10-CM | POA: Diagnosis not present

## 2017-10-18 NOTE — Progress Notes (Signed)
Pt came in for EKG. She is currently taking flecainide 100 bid. She has not had any problems.

## 2017-10-19 NOTE — Progress Notes (Signed)
I reviewed the tracing.  Rhythm looks to be continued atypical atrial flutter versus atrial tachycardia although rate is slower.  Continue with present medications.

## 2017-10-19 NOTE — Telephone Encounter (Signed)
Called pt. Left detailed message. Asked pt to return call.

## 2017-10-19 NOTE — Telephone Encounter (Signed)
-----  Message from Satira Sark, MD sent at 10/19/2017  8:09 AM EDT -----   ----- Message ----- From: Drema Dallas, CMA Sent: 10/18/2017   4:31 PM EDT To: Satira Sark, MD

## 2017-11-28 ENCOUNTER — Other Ambulatory Visit: Payer: Self-pay | Admitting: Family Medicine

## 2017-12-08 ENCOUNTER — Ambulatory Visit: Payer: Medicare HMO | Admitting: Family Medicine

## 2017-12-12 ENCOUNTER — Telehealth: Payer: Self-pay | Admitting: Cardiology

## 2017-12-12 MED ORDER — APIXABAN 5 MG PO TABS: 5.0000 mg | ORAL_TABLET | Freq: Two times a day (BID) | ORAL | 3 refills | Status: DC

## 2017-12-12 NOTE — Telephone Encounter (Signed)
rx sent.

## 2017-12-12 NOTE — Telephone Encounter (Signed)
Pt forgot to order apixaban (ELIQUIS) 5 MG TABS tablet [[id number]] and she is needing a Rx sent to [facility] till it comes in from her mail order

## 2017-12-27 ENCOUNTER — Ambulatory Visit: Payer: Medicare HMO | Admitting: "Endocrinology

## 2018-01-04 ENCOUNTER — Other Ambulatory Visit: Payer: Self-pay

## 2018-01-04 ENCOUNTER — Encounter: Payer: Self-pay | Admitting: Cardiology

## 2018-01-04 ENCOUNTER — Ambulatory Visit: Payer: Medicare HMO | Admitting: Cardiology

## 2018-01-04 ENCOUNTER — Other Ambulatory Visit: Payer: Self-pay | Admitting: "Endocrinology

## 2018-01-04 VITALS — BP 138/80 | HR 100 | Ht 63.0 in | Wt 173.8 lb

## 2018-01-04 DIAGNOSIS — E782 Mixed hyperlipidemia: Secondary | ICD-10-CM

## 2018-01-04 DIAGNOSIS — I480 Paroxysmal atrial fibrillation: Secondary | ICD-10-CM | POA: Diagnosis not present

## 2018-01-04 DIAGNOSIS — E1165 Type 2 diabetes mellitus with hyperglycemia: Principal | ICD-10-CM

## 2018-01-04 DIAGNOSIS — IMO0001 Reserved for inherently not codable concepts without codable children: Secondary | ICD-10-CM

## 2018-01-04 DIAGNOSIS — Z794 Long term (current) use of insulin: Principal | ICD-10-CM

## 2018-01-04 DIAGNOSIS — I6523 Occlusion and stenosis of bilateral carotid arteries: Secondary | ICD-10-CM

## 2018-01-04 DIAGNOSIS — I10 Essential (primary) hypertension: Secondary | ICD-10-CM

## 2018-01-04 MED ORDER — DILTIAZEM HCL ER COATED BEADS 180 MG PO CP24: 180.0000 mg | ORAL_CAPSULE | Freq: Every day | ORAL | 3 refills | Status: DC

## 2018-01-04 NOTE — Patient Instructions (Signed)
Medication Instructions:  INCREASE Cardizem to 180 mg daily  If you need a refill on your cardiac medications before your next appointment, please call your pharmacy.   Lab work: None today If you have labs (blood work) drawn today and your tests are completely normal, you will receive your results only by: Marland Kitchen MyChart Message (if you have MyChart) OR . A paper copy in the mail If you have any lab test that is abnormal or we need to change your treatment, we will call you to review the results.  Testing/Procedures: None today  Follow-Up: At [facility], you and your health needs are our priority.  As part of our continuing mission to provide you with exceptional heart care, we have created designated Provider Care Teams.  These Care Teams include your primary Cardiologist (physician) and Advanced Practice Providers (APPs -  Physician Assistants and Nurse Practitioners) who all work together to provide you with the care you need, when you need it. You will need a follow up appointment in 6 weeks.  Please call our office 2 months in advance to schedule this appointment.  You may see Rozann Lesches, MD or one of the following Advanced Practice Providers on your designated Care Team:   Bernerd Pho, PA-C [facility]) . Ermalinda Barrios, PA-C ([place])  Any Other Special Instructions Will Be Listed Below (If Applicable). None

## 2018-01-04 NOTE — Progress Notes (Signed)
Cardiology Office Note  Date: 01/04/2018   ID: Ellen Jordan, Ellen Jordan [birth year], MRN [id number]  PCP: [name], MD  Primary Cardiologist: Rozann Lesches, MD   Chief Complaint  Patient presents with  . Atrial arrhythmias    History of Present Illness: Ellen Jordan is a 72 y.o. female last seen in September.  She is here with her husband for a follow-up visit.  She reports dyspnea on exertion and fatigue, no specific palpitations or chest pain however.  She states that she has been compliant with her medications.  She has had no dizziness or syncope.  Flecainide dose was increased to 100 mg twice daily at the last visit.  She also remains on Eliquis for stroke prophylaxis along with atenolol and Cardizem CD for heart rate control.  I personally reviewed her ECG today which shows atrial flutter with variable conduction and right bundle branch block.  Past Medical History:  Diagnosis Date  . Allergic rhinitis   . Allergy   . Anxiety   . Carpal tunnel syndrome   . Cataract   . Depression   . Diabetic neuropathy ([place])   . Essential hypertension   . GERD (gastroesophageal reflux disease)   . Mixed hyperlipidemia   . PAF (paroxysmal atrial fibrillation) ([place])    a. s/p TEE cardioversion March 2017. b. Recurrence by event monitor 04/2016.  Marland Kitchen RBBB   . Restless leg syndrome   . Type 2 diabetes mellitus ([place])   . Uterine cancer [facility])     Past Surgical History:  Procedure Laterality Date  . ABDOMINAL HYSTERECTOMY     uterine cancer  . BACK SURGERY    . CARDIOVERSION N/A 03/11/2015   Procedure: CARDIOVERSION;  Surgeon: [name], MD;  Location: [facility];  Service: Cardiovascular;  Laterality: N/A;  . CHOLECYSTECTOMY    . EYE SURGERY     cataracts  . SPINE SURGERY    . TEE WITHOUT CARDIOVERSION N/A 03/11/2015   Procedure: TRANSESOPHAGEAL ECHOCARDIOGRAM (TEE);  Surgeon: [name], MD;  Location: [facility] ENDOSCOPY;  Service: Cardiovascular;  Laterality: N/A;     Current Outpatient Medications  Medication Sig Dispense Refill  . ACCU-CHEK FASTCLIX LANCETS MISC CHECK BLOOD GLUCOSE FOUR TIMES DAILY 408 each 2  . ACCU-CHEK GUIDE test strip USE AS INSTRUCTED FOUR TIMES DAILY 400 each 1  . ACCU-CHEK SMARTVIEW test strip CHECK BLOOD GLUCOSE FOUR TIMES DAILY 400 each 2  . albuterol (PROVENTIL HFA;VENTOLIN HFA) 108 (90 Base) MCG/ACT inhaler Inhale 2 puffs into the lungs every 6 (six) hours as needed for wheezing or shortness of breath. 1 Inhaler 0  . Alcohol Swabs (B-D SINGLE USE SWABS REGULAR) PADS USE FOUR TIMES DAILY 400 each 2  . apixaban (ELIQUIS) 5 MG TABS tablet Take 1 tablet (5 mg total) by mouth 2 (two) times daily. 60 tablet 3  . atenolol (TENORMIN) 50 MG tablet TAKE 1 TABLET EVERY DAY 90 tablet 0  . atorvastatin (LIPITOR) 40 MG tablet TAKE 1 TABLET EVERY DAY 90 tablet 3  . b complex vitamins tablet Take 1 tablet by mouth daily.    . Biotin 5000 MCG CAPS Take 1 capsule by mouth daily.    . Blood Glucose Monitoring Suppl (ACCU-CHEK GUIDE) w/Device KIT 1 each by Does not apply route 4 (four) times daily. 1 kit 0  . Calcium-Phosphorus-Vitamin D (CALCIUM GUMMIES PO) Take 500-1,000 mg by mouth daily.    . chlorpheniramine (EQ CHLORTABS) 4 MG tablet Take 4 mg by mouth  2 (two) times daily as needed for allergies.    . cholecalciferol (VITAMIN D) 1000 units tablet Take 1,000 Units by mouth daily.    Marland Kitchen diltiazem (DILTIAZEM CD) 120 MG 24 hr capsule Take 1 capsule (120 mg total) by mouth daily. 90 capsule 3  . diphenhydrAMINE (BENADRYL) 25 MG tablet Take 25 mg by mouth daily.    . flecainide (TAMBOCOR) 100 MG tablet Take 1 tablet (100 mg total) by mouth 2 (two) times daily. 180 tablet 3  . glucose blood (ACCU-CHEK GUIDE) test strip Use as instructed 4 x daily 400 each 1  . insulin aspart (NOVOLOG) 100 UNIT/ML injection Inject 10-16 Units into the skin 3 (three) times daily with meals. 50 mL 1  . insulin detemir (LEVEMIR) 100 UNIT/ML injection Inject 0.5 mLs  (50 Units total) into the skin at bedtime. 60 mL 1  . liraglutide (VICTOZA) 18 MG/3ML SOPN Inject 0.3 mLs (1.8 mg total) into the skin daily. 27 mL 1  . lisinopril (PRINIVIL,ZESTRIL) 20 MG tablet TAKE 1 TABLET (20 MG TOTAL) BY MOUTH DAILY. 90 tablet 0  . Misc Natural Products (ESTROVEN + ENERGY MAX STRENGTH) TABS Take 1 tablet by mouth daily.    . Multiple Vitamin (MULTIVITAMIN) tablet Take 1 tablet by mouth daily.    . Omega-3 Fatty Acids (FISH OIL) 1200 MG CAPS Take 1 capsule by mouth daily.    Marland Kitchen omeprazole (PRILOSEC) 20 MG capsule TAKE 1 CAPSULE EVERY DAY 90 capsule 1  . sertraline (ZOLOFT) 100 MG tablet TAKE 1 TABLET (100 MG TOTAL) BY MOUTH DAILY. 90 tablet 0  . vitamin E (VITAMIN E) 400 UNIT capsule Take 400 Units by mouth daily.     No current facility-administered medications for this visit.    Allergies:  Penicillins   Social History: The patient  reports that she quit smoking about 12 years ago. Her smoking use included cigarettes. She started smoking about 57 years ago. She has a 50.00 pack-year smoking history. She quit smokeless tobacco use about 12 years ago. She reports that she does not drink alcohol or use drugs.   ROS:  Please see the history of present illness. Otherwise, complete review of systems is positive for none.  All other systems are reviewed and negative.   Physical Exam: VS:  BP 138/80   Pulse 100   Ht 5' 3" (1.6 m)   Wt 173 lb 12.8 oz (78.8 kg)   SpO2 95%   BMI 30.79 kg/m , BMI Body mass index is 30.79 kg/m.  Wt Readings from Last 3 Encounters:  01/04/18 173 lb 12.8 oz (78.8 kg)  10/18/17 171 lb (77.6 kg)  10/04/17 172 lb (78 kg)    General: Patient appears comfortable at rest. HEENT: Conjunctiva and lids normal, oropharynx clear. Neck: Supple, no elevated JVP or carotid bruits, no thyromegaly. Lungs: Clear to auscultation, nonlabored breathing at rest. Cardiac: Irregular, no S3, 2/6 systolic murmur, no pericardial rub. Abdomen: Soft, nontender,  bowel sounds present. Extremities: No pitting edema, distal pulses 2+. Skin: Warm and dry. Musculoskeletal: No kyphosis. Neuropsychiatric: Alert and oriented x3, affect grossly appropriate.  ECG: I personally reviewed the tracing from 10/18/2017 which showed probable atrial flutter versus ectopic atrial tachycardia with variable conduction and right bundle branch block.  Recent Labwork: 06/16/2017: ALT 21; AST 27; BUN 19; Creat 1.09; Hemoglobin 10.2; Platelets 212; Potassium 4.2; Sodium 141; TSH 1.95     Component Value Date/Time   CHOL 116 06/16/2017 0959   TRIG 182 (H) 06/16/2017 1610  HDL 27 (L) 06/16/2017 0959   CHOLHDL 4.3 06/16/2017 0959   VLDL 49 (H) 03/30/2016 1206   LDLCALC 63 06/16/2017 0959    Other Studies Reviewed Today:  [facility] Myoview 05/09/2016:  There was no ST segment deviation noted during stress.  The study is normal. No ischemia or scar.  This is a low risk study.  Nuclear stress EF: 78%.  TEE 03/11/2015: Study Conclusions  - Left ventricle: Systolic function was normal. The estimated ejection fraction was in the range of 50% to 55%. Wall motion was normal; there were no regional wall motion abnormalities. - Aortic valve: No evidence of vegetation. No evidence of vegetation. - Aorta: The aorta was normal, not dilated, there were moderate non-mobile calcifications. - Mitral valve: Mildly calcified annulus. Mildly thickened leaflets . There was mild regurgitation. - Left atrium: The atrium was dilated. No evidence of thrombus in the atrial cavity or appendage. No evidence of thrombus in the atrial cavity or appendage. No evidence of thrombus in the atrial cavity or appendage. The appendage was morphologically a left appendage, multilobulated, and of normal size. Emptying velocity were decreased. - Right atrium: No evidence of thrombus in the atrial cavity or appendage. - Atrial septum: There was increased thickness of the  septum, consistent with lipomatous hypertrophy.  Impressions:  - TEE did not identify any source of intracardiac thrmbus. A successful cardioversion followed the TEE.  Assessment and Plan:  1.  History of atrial fibrillation with CHADSVASC score of 4.  Current rhythm is more consistent with atrial flutter.  She continues to report dyspnea exertion and fatigue.  Flecainide was increased at the previous visit and we will plan to increase her Cardizem CD to 180 mg daily.  Office visit scheduled to discuss proceeding with elective cardioversion, she did not want to pursue this at the present time.  2.  Essential hypertension, blood pressure is well controlled.  3.  Mixed hyperlipidemia on Lipitor.  She follows with Dr. Nevada Crane.  Last LDL 63.  4.  Asymptomatic moderate carotid artery disease.  Current medicines were reviewed with the patient today.   Orders Placed This Encounter  Procedures  . EKG 12-Lead    Disposition: Follow-up in 4 to 6 weeks.  Signed, Satira Sark, MD, [facility] 01/04/2018 2:28 PM    [facility] [address] [place], [place] [address] Phone: [phone]; Fax: [phone]

## 2018-01-05 LAB — COMPREHENSIVE METABOLIC PANEL
AG Ratio: 1.5 (calc) (ref 1.0–2.5)
ALT: 16 U/L (ref 6–29)
AST: 20 U/L (ref 10–35)
Albumin: 3.8 g/dL (ref 3.6–5.1)
Alkaline phosphatase (APISO): 66 U/L (ref 33–130)
BUN / CREAT RATIO: 14 (calc) (ref 6–22)
BUN: 14 mg/dL (ref 7–25)
CO2: 29 mmol/L (ref 20–32)
CREATININE: 1.03 mg/dL — AB (ref 0.60–0.93)
Calcium: 9 mg/dL (ref 8.6–10.4)
Chloride: 108 mmol/L (ref 98–110)
GLUCOSE: 110 mg/dL — AB (ref 65–99)
Globulin: 2.6 g/dL (calc) (ref 1.9–3.7)
Potassium: 4.3 mmol/L (ref 3.5–5.3)
Sodium: 144 mmol/L (ref 135–146)
Total Bilirubin: 0.6 mg/dL (ref 0.2–1.2)
Total Protein: 6.4 g/dL (ref 6.1–8.1)

## 2018-01-05 LAB — HEMOGLOBIN A1C
HEMOGLOBIN A1C: 6.7 %{Hb} — AB (ref <5.7)
MEAN PLASMA GLUCOSE: 146 (calc)
eAG (mmol/L): 8.1 (calc)

## 2018-01-18 ENCOUNTER — Ambulatory Visit (INDEPENDENT_AMBULATORY_CARE_PROVIDER_SITE_OTHER): Payer: Medicare HMO | Admitting: "Endocrinology

## 2018-01-18 ENCOUNTER — Encounter: Payer: Self-pay | Admitting: "Endocrinology

## 2018-01-18 VITALS — BP 130/71 | HR 93 | Ht 63.0 in | Wt 174.0 lb

## 2018-01-18 DIAGNOSIS — I10 Essential (primary) hypertension: Secondary | ICD-10-CM | POA: Diagnosis not present

## 2018-01-18 DIAGNOSIS — E1165 Type 2 diabetes mellitus with hyperglycemia: Secondary | ICD-10-CM | POA: Diagnosis not present

## 2018-01-18 DIAGNOSIS — IMO0001 Reserved for inherently not codable concepts without codable children: Secondary | ICD-10-CM

## 2018-01-18 DIAGNOSIS — E782 Mixed hyperlipidemia: Secondary | ICD-10-CM | POA: Diagnosis not present

## 2018-01-18 DIAGNOSIS — Z794 Long term (current) use of insulin: Secondary | ICD-10-CM

## 2018-01-18 NOTE — Progress Notes (Signed)
Endocrinology follow-up note   Subjective:    Patient ID: Ellen Jordan, female    DOB: 02/04/1945, PCP Hall, John Z, MD   Past Medical History:  Diagnosis Date  . Allergic rhinitis   . Allergy   . Anxiety   . Carpal tunnel syndrome   . Cataract   . Depression   . Diabetic neuropathy (HCC)   . Essential hypertension   . GERD (gastroesophageal reflux disease)   . Mixed hyperlipidemia   . PAF (paroxysmal atrial fibrillation) (HCC)    a. s/p TEE cardioversion March 2017. b. Recurrence by event monitor 04/2016.  . RBBB   . Restless leg syndrome   . Type 2 diabetes mellitus (HCC)   . Uterine cancer (HCC)    Past Surgical History:  Procedure Laterality Date  . ABDOMINAL HYSTERECTOMY     uterine cancer  . BACK SURGERY    . CARDIOVERSION N/A 03/11/2015   Procedure: CARDIOVERSION;  Surgeon: Katarina H Nelson, MD;  Location: [facility];  Service: Cardiovascular;  Laterality: N/A;  . CHOLECYSTECTOMY    . EYE SURGERY     cataracts  . SPINE SURGERY    . TEE WITHOUT CARDIOVERSION N/A 03/11/2015   Procedure: TRANSESOPHAGEAL ECHOCARDIOGRAM (TEE);  Surgeon: Katarina H Nelson, MD;  Location: [facility];  Service: Cardiovascular;  Laterality: N/A;   Social History   Socioeconomic History  . Marital status: Married    Spouse name: earl  . Number of children: 2  . Years of education: 13  . Highest education level: Not on file  Occupational History  . Occupation: retired  Social Needs  . Financial resource strain: Not on file  . Food insecurity:    Worry: Not on file    Inability: Not on file  . Transportation needs:    Medical: Not on file    Non-medical: Not on file  Tobacco Use  . Smoking status: Former Smoker    Packs/day: 1.00    Years: 50.00    Pack years: 50.00    Types: Cigarettes    Start date: 01/10/1961    Last attempt to quit: 01/10/2005    Years since quitting: 13.0  . Smokeless tobacco: Former User    Quit date: 01/10/2005  Substance and Sexual Activity  .  Alcohol use: No    Alcohol/week: 0.0 standard drinks  . Drug use: No  . Sexual activity: Not Currently    Partners: Male  Lifestyle  . Physical activity:    Days per week: Not on file    Minutes per session: Not on file  . Stress: Not on file  Relationships  . Social connections:    Talks on phone: Not on file    Gets together: Not on file    Attends religious service: Not on file    Active member of club or organization: Not on file    Attends meetings of clubs or organizations: Not on file    Relationship status: Not on file  Other Topics Concern  . Not on file  Social History Narrative   Moved to this area in 1979 from [place].    Married for over 50 years. Has two children. Lives with husband Toby (earl)   Likes to read   Retired from School system.   Enjoys time with family, beach, read, exericse.   Walk dog.   Eats all food groups.   Wear seatbelt.   Wear sunscreen.   Outpatient Encounter Medications as of 01/18/2018  Medication Sig  .   ACCU-CHEK FASTCLIX LANCETS MISC CHECK BLOOD GLUCOSE FOUR TIMES DAILY  . ACCU-CHEK GUIDE test strip USE AS INSTRUCTED FOUR TIMES DAILY  . ACCU-CHEK SMARTVIEW test strip CHECK BLOOD GLUCOSE FOUR TIMES DAILY  . albuterol (PROVENTIL HFA;VENTOLIN HFA) 108 (90 Base) MCG/ACT inhaler Inhale 2 puffs into the lungs every 6 (six) hours as needed for wheezing or shortness of breath.  . Alcohol Swabs (B-D SINGLE USE SWABS REGULAR) PADS USE FOUR TIMES DAILY  . apixaban (ELIQUIS) 5 MG TABS tablet Take 1 tablet (5 mg total) by mouth 2 (two) times daily.  Marland Kitchen atenolol (TENORMIN) 50 MG tablet TAKE 1 TABLET EVERY DAY  . atorvastatin (LIPITOR) 40 MG tablet TAKE 1 TABLET EVERY DAY  . b complex vitamins tablet Take 1 tablet by mouth daily.  . Biotin 5000 MCG CAPS Take 1 capsule by mouth daily.  . Blood Glucose Monitoring Suppl (ACCU-CHEK GUIDE) w/Device KIT 1 each by Does not apply route 4 (four) times daily.  . Calcium-Phosphorus-Vitamin D (CALCIUM  GUMMIES PO) Take 500-1,000 mg by mouth daily.  . chlorpheniramine (EQ CHLORTABS) 4 MG tablet Take 4 mg by mouth 2 (two) times daily as needed for allergies.  . cholecalciferol (VITAMIN D) 1000 units tablet Take 1,000 Units by mouth daily.  Marland Kitchen diltiazem (CARDIZEM CD) 180 MG 24 hr capsule Take 1 capsule (180 mg total) by mouth daily.  . diphenhydrAMINE (BENADRYL) 25 MG tablet Take 25 mg by mouth daily.  . flecainide (TAMBOCOR) 100 MG tablet Take 1 tablet (100 mg total) by mouth 2 (two) times daily.  Marland Kitchen glucose blood (ACCU-CHEK GUIDE) test strip Use as instructed 4 x daily  . insulin aspart (NOVOLOG) 100 UNIT/ML injection Inject 10-16 Units into the skin 3 (three) times daily with meals.  . insulin detemir (LEVEMIR) 100 UNIT/ML injection Inject 0.5 mLs (50 Units total) into the skin at bedtime.  . liraglutide (VICTOZA) 18 MG/3ML SOPN Inject 0.3 mLs (1.8 mg total) into the skin daily.  Marland Kitchen lisinopril (PRINIVIL,ZESTRIL) 20 MG tablet TAKE 1 TABLET (20 MG TOTAL) BY MOUTH DAILY.  Marland Kitchen Misc Natural Products (ESTROVEN + ENERGY MAX STRENGTH) TABS Take 1 tablet by mouth daily.  . Multiple Vitamin (MULTIVITAMIN) tablet Take 1 tablet by mouth daily.  . Omega-3 Fatty Acids (FISH OIL) 1200 MG CAPS Take 1 capsule by mouth daily.  Marland Kitchen omeprazole (PRILOSEC) 20 MG capsule TAKE 1 CAPSULE EVERY DAY  . sertraline (ZOLOFT) 100 MG tablet TAKE 1 TABLET (100 MG TOTAL) BY MOUTH DAILY.  . vitamin E (VITAMIN E) 400 UNIT capsule Take 400 Units by mouth daily.   No facility-administered encounter medications on file as of 01/18/2018.    ALLERGIES: Allergies  Allergen Reactions  . Penicillins Hives    Has patient had a PCN reaction causing immediate rash, facial/tongue/throat swelling, SOB or lightheadedness with hypotension: Yes Has patient had a PCN reaction causing severe rash involving mucus membranes or skin necrosis: No Has patient had a PCN reaction that required hospitalization No Has patient had a PCN reaction occurring  within the last 10 years: No If all of the above answers are "NO", then may proceed with Cephalosporin use.    VACCINATION STATUS: Immunization History  Administered Date(s) Administered  . Influenza,inj,Quad PF,6+ Mos 11/04/2016  . Pneumococcal Conjugate-13 11/30/2016  . Pneumococcal Polysaccharide-23 06/07/2017  . Td 06/07/2017    Diabetes  She presents for her follow-up diabetic visit. She has type 2 diabetes mellitus. Onset time: She was diagnosed at approximate age of 29 years. Her disease course  has been stable. There are no hypoglycemic associated symptoms. Pertinent negatives for hypoglycemia include no confusion, headaches, pallor or seizures. There are no diabetic associated symptoms. Pertinent negatives for diabetes include no chest pain, no polydipsia, no polyphagia and no polyuria. There are no hypoglycemic complications. Symptoms are stable. There are no diabetic complications. Risk factors for coronary artery disease include diabetes mellitus, dyslipidemia, hypertension, obesity, sedentary lifestyle and tobacco exposure. Current diabetic treatment includes intensive insulin program. She is compliant with treatment most of the time. Her weight is stable. She is following a generally unhealthy diet. She has not had a previous visit with a dietitian (She declined referral to CDE.). Her home blood glucose trend is decreasing steadily. Her breakfast blood glucose range is generally 140-180 mg/dl. Her lunch blood glucose range is generally 140-180 mg/dl. Her dinner blood glucose range is generally 140-180 mg/dl. Her overall blood glucose range is 140-180 mg/dl. An ACE inhibitor/angiotensin II receptor blocker is being taken. She does not see a podiatrist.Eye exam is not current (She is overdue for eye exam, I advised her to schedule a visit to her eye doctor.).  Hyperlipidemia  This is a chronic problem. The current episode started more than 1 year ago. The problem is controlled.  Exacerbating diseases include diabetes and obesity. Pertinent negatives include no chest pain, leg pain, myalgias or shortness of breath. Current antihyperlipidemic treatment includes statins. Risk factors for coronary artery disease include dyslipidemia, diabetes mellitus, hypertension, a sedentary lifestyle and obesity.  Hypertension  This is a chronic problem. The current episode started more than 1 year ago. The problem is controlled. Pertinent negatives include no chest pain, headaches, palpitations or shortness of breath. Risk factors for coronary artery disease include diabetes mellitus, dyslipidemia, obesity, sedentary lifestyle and smoking/tobacco exposure. Past treatments include ACE inhibitors. The current treatment provides moderate improvement.     Review of Systems  Constitutional: Negative for unexpected weight change.  HENT: Negative for trouble swallowing and voice change.   Eyes: Negative for visual disturbance.  Respiratory: Negative for cough, shortness of breath and wheezing.   Cardiovascular: Negative for chest pain, palpitations and leg swelling.  Gastrointestinal: Negative for diarrhea, nausea and vomiting.  Endocrine: Negative for cold intolerance, heat intolerance, polydipsia, polyphagia and polyuria.  Musculoskeletal: Negative for arthralgias and myalgias.  Skin: Negative for color change, pallor, rash and wound.  Neurological: Negative for seizures and headaches.  Psychiatric/Behavioral: Negative for confusion and suicidal ideas.    Objective:    BP 130/71   Pulse 93   Ht 5' 3" (1.6 m)   Wt 174 lb (78.9 kg)   BMI 30.82 kg/m   Wt Readings from Last 3 Encounters:  01/18/18 174 lb (78.9 kg)  01/04/18 173 lb 12.8 oz (78.8 kg)  10/18/17 171 lb (77.6 kg)    Physical Exam  Constitutional: She is oriented to person, place, and time. She appears well-developed.  HENT:  Head: Normocephalic and atraumatic.  Eyes: EOM are normal.  Neck: Normal range of motion.  Neck supple. No tracheal deviation present. No thyromegaly present.  Pulmonary/Chest: Effort normal.  Abdominal: There is no abdominal tenderness. There is no guarding.  Musculoskeletal: Normal range of motion.        General: No edema.     Comments: She has bilateral peripheral neuropathy.  Neurological: She is alert and oriented to person, place, and time. No cranial nerve deficit. Coordination normal.  Skin: Skin is warm and dry. No rash noted. No erythema. No pallor.  Psychiatric: She has  a normal mood and affect. Judgment normal.    Results for orders placed or performed in visit on 01/04/18  Comprehensive metabolic panel  Result Value Ref Range   Glucose, Bld 110 (H) 65 - 99 mg/dL   BUN 14 7 - 25 mg/dL   Creat 1.03 (H) 0.60 - 0.93 mg/dL   BUN/Creatinine Ratio 14 6 - 22 (calc)   Sodium 144 135 - 146 mmol/L   Potassium 4.3 3.5 - 5.3 mmol/L   Chloride 108 98 - 110 mmol/L   CO2 29 20 - 32 mmol/L   Calcium 9.0 8.6 - 10.4 mg/dL   Total Protein 6.4 6.1 - 8.1 g/dL   Albumin 3.8 3.6 - 5.1 g/dL   Globulin 2.6 1.9 - 3.7 g/dL (calc)   AG Ratio 1.5 1.0 - 2.5 (calc)   Total Bilirubin 0.6 0.2 - 1.2 mg/dL   Alkaline phosphatase (APISO) 66 33 - 130 U/L   AST 20 10 - 35 U/L   ALT 16 6 - 29 U/L  Hemoglobin A1c  Result Value Ref Range   Hgb A1c MFr Bld 6.7 (H) <5.7 % of total Hgb   Mean Plasma Glucose 146 (calc)   eAG (mmol/L) 8.1 (calc)   Lipid Panel     Component Value Date/Time   CHOL 116 06/16/2017 0959   TRIG 182 (H) 06/16/2017 0959   HDL 27 (L) 06/16/2017 0959   CHOLHDL 4.3 06/16/2017 0959   VLDL 49 (H) 03/30/2016 1206   LDLCALC 63 06/16/2017 0959     Assessment & Plan:   1. Uncontrolled type 2 diabetes mellitus without complication, with long-term current use of insulin   - She remains at a high risk for more acute and chronic complications of diabetes which include CAD, CVA, CKD, retinopathy, and neuropathy. These are all discussed in detail with the patient.  Patient  came with improved A1c of 6.7% from 7.3% last visit, overall improving from 14.6%.   - Glucose logs and insulin administration records pertaining to this visit,  to be scanned into patient's records.  Recent labs reviewed.   - I have re-counseled the patient on diet management  by adopting a carbohydrate restricted / protein rich  Diet.  -  Suggestion is made for her to avoid simple carbohydrates  from her diet including Cakes, Sweet Desserts / Pastries, Ice Cream, Soda (diet and regular), Sweet Tea, Candies, Chips, Cookies, Store Bought Juices, Alcohol in Excess of  1-2 drinks a day, Artificial Sweeteners, and "Sugar-free" Products. This will help patient to have stable blood glucose profile and potentially avoid unintended weight gain.   - Patient is advised to stick to a routine mealtimes to eat 3 meals  a day and avoid unnecessary snacks ( to snack only to correct hypoglycemia).  - The patient  declined a referral to a  CDE for individualized DM education.  - I have approached patient with the following individualized plan to manage diabetes and patient agrees.  -Based on her stable glycemic profile, I advised her to continue on her current dosing regimen of insulin.  -She is advised to continue  Levemir 50 units nightly, continue NovoLog  10 units units   3 times a day before meals  , plus correction dose associated with monitoring of blood glucose 4 times a day-before meals and at bedtime.  -She is tolerating Victoza.  She is advised to continue Victoza at 1.8 mg subcutaneously daily .  - She would have benefited from continuous glucose monitoring.    Her insurance did not provide adequate coverage.    - Patient specific target  for A1c; LDL, HDL, Triglycerides, and  Waist Circumference were discussed in detail.  2) BP/HTN: Her blood pressure is controlled to target.   She is advised to continue her current medications including lisinopril 20 mg p.o. daily.    3) Lipids/HPL: Recent lipid  panel revealed controlled LDL at 63. She is advised to continue atorvastatin 40 mg p.o. nightly.   4)  Weight/Diet: she has declined CDE consult, exercise, and carbohydrates information provided.  5) Chronic Care/Health Maintenance:  -Patient is on ACEI/ARB and Statin medications and encouraged to continue to follow up with Ophthalmology (she is overdue for her eye exam), Podiatrist at least yearly or according to recommendations, and advised to  stay away from smoking. I have recommended yearly flu vaccine and pneumonia vaccination at least every 5 years; moderate intensity exercise for up to 150 minutes weekly; and  sleep for at least 7 hours a day.  -Foot exam is significant for bilateral peripheral neuropathy.  - I advised patient to maintain close follow up with Hall, John Z, MD for primary care needs.  - Time spent with the patient: 25 min, of which >50% was spent in reviewing her blood glucose logs , discussing her hypo- and hyper-glycemic episodes, reviewing her current and  previous labs and insulin doses and developing a plan to avoid hypo- and hyper-glycemia. Please refer to Patient Instructions for Blood Glucose Monitoring and Insulin/Medications Dosing Guide"  in media tab for additional information. Tritia C Karis participated in the discussions, expressed understanding, and voiced agreement with the above plans.  All questions were answered to her satisfaction. she is encouraged to contact clinic should she have any questions or concerns prior to her return visit.   Follow up plan: -Return in about 6 months (around 07/19/2018) for Meter, and Logs.  Gebre Nida, MD Phone: [phone]  Fax: [phone]   -  This note was partially dictated with voice recognition software. Similar sounding words can be transcribed inadequately or may not  be corrected upon review.  01/18/2018, 4:02 PM 

## 2018-01-18 NOTE — Patient Instructions (Signed)

## 2018-02-14 NOTE — Progress Notes (Signed)
Cardiology Office Note  Date: 02/16/2018   ID: Ellen, Jordan [birth year], MRN [id number]  PCP: [name], MD  Primary Cardiologist: Rozann Lesches, MD   Chief Complaint  Patient presents with  . Atrial fibrillation/flutter    History of Present Illness: Ellen Jordan is a 73 y.o. female last seen in December 2019.  She is here today with her husband for a follow-up visit.  She tells me that over the last 4 to 6 weeks she has been having trouble with intermittent cough and chest congestion, also short of breath and fatigue.  Not specifically any chest pain or palpitations.  At the last visit we continued flecainide along with Eliquis, increased Cardizem CD 180 mg daily.  She looked to be in atrial flutter at that time and we did talk about the possibility of cardioversion if needed.  I personally reviewed her follow-up ECG today which shows sinus rhythm with PACs and right bundle branch block.  Also rightward axis suggesting probable pulmonary disease.  She does not report any bleeding problems or changes in stools.  Past Medical History:  Diagnosis Date  . Allergic rhinitis   . Allergy   . Anxiety   . Carpal tunnel syndrome   . Cataract   . Depression   . Diabetic neuropathy ([place])   . Essential hypertension   . GERD (gastroesophageal reflux disease)   . Mixed hyperlipidemia   . PAF (paroxysmal atrial fibrillation) ([place])    a. s/p TEE cardioversion March 2017. b. Recurrence by event monitor 04/2016.  Marland Kitchen RBBB   . Restless leg syndrome   . Type 2 diabetes mellitus ([place])   . Uterine cancer [facility])     Past Surgical History:  Procedure Laterality Date  . ABDOMINAL HYSTERECTOMY     uterine cancer  . BACK SURGERY    . CARDIOVERSION N/A 03/11/2015   Procedure: CARDIOVERSION;  Surgeon: [name], MD;  Location: [place];  Service: Cardiovascular;  Laterality: N/A;  . CHOLECYSTECTOMY    . EYE SURGERY     cataracts  . SPINE SURGERY    . TEE WITHOUT  CARDIOVERSION N/A 03/11/2015   Procedure: TRANSESOPHAGEAL ECHOCARDIOGRAM (TEE);  Surgeon: [name], MD;  Location[facility];  Service: Cardiovascular;  Laterality: N/A;    Current Outpatient Medications  Medication Sig Dispense Refill  . ACCU-CHEK FASTCLIX LANCETS MISC CHECK BLOOD GLUCOSE FOUR TIMES DAILY 408 each 2  . ACCU-CHEK GUIDE test strip USE AS INSTRUCTED FOUR TIMES DAILY 400 each 1  . ACCU-CHEK SMARTVIEW test strip CHECK BLOOD GLUCOSE FOUR TIMES DAILY 400 each 2  . albuterol (PROVENTIL HFA;VENTOLIN HFA) 108 (90 Base) MCG/ACT inhaler Inhale 2 puffs into the lungs every 6 (six) hours as needed for wheezing or shortness of breath. 1 Inhaler 0  . Alcohol Swabs (B-D SINGLE USE SWABS REGULAR) PADS USE FOUR TIMES DAILY 400 each 2  . apixaban (ELIQUIS) 5 MG TABS tablet Take 1 tablet (5 mg total) by mouth 2 (two) times daily. 60 tablet 3  . atenolol (TENORMIN) 50 MG tablet TAKE 1 TABLET EVERY DAY 90 tablet 0  . atorvastatin (LIPITOR) 40 MG tablet TAKE 1 TABLET EVERY DAY 90 tablet 3  . b complex vitamins tablet Take 1 tablet by mouth daily.    . Biotin 5000 MCG CAPS Take 1 capsule by mouth daily.    . Blood Glucose Monitoring Suppl (ACCU-CHEK GUIDE) w/Device KIT 1 each by Does not apply route 4 (four) times daily.  1 kit 0  . Calcium-Phosphorus-Vitamin D (CALCIUM GUMMIES PO) Take 500-1,000 mg by mouth daily.    . chlorpheniramine (EQ CHLORTABS) 4 MG tablet Take 4 mg by mouth 2 (two) times daily as needed for allergies.    . cholecalciferol (VITAMIN D) 1000 units tablet Take 1,000 Units by mouth daily.    Marland Kitchen diltiazem (CARDIZEM CD) 180 MG 24 hr capsule Take 1 capsule (180 mg total) by mouth daily. 90 capsule 3  . diphenhydrAMINE (BENADRYL) 25 MG tablet Take 25 mg by mouth daily.    . flecainide (TAMBOCOR) 100 MG tablet Take 1 tablet (100 mg total) by mouth 2 (two) times daily. 180 tablet 3  . glucose blood (ACCU-CHEK GUIDE) test strip Use as instructed 4 x daily 400 each 1  . insulin  aspart (NOVOLOG) 100 UNIT/ML injection Inject 10-16 Units into the skin 3 (three) times daily with meals. 50 mL 1  . insulin detemir (LEVEMIR) 100 UNIT/ML injection Inject 0.5 mLs (50 Units total) into the skin at bedtime. 60 mL 1  . liraglutide (VICTOZA) 18 MG/3ML SOPN Inject 0.3 mLs (1.8 mg total) into the skin daily. 27 mL 1  . lisinopril (PRINIVIL,ZESTRIL) 20 MG tablet TAKE 1 TABLET (20 MG TOTAL) BY MOUTH DAILY. 90 tablet 0  . Misc Natural Products (ESTROVEN + ENERGY MAX STRENGTH) TABS Take 1 tablet by mouth daily.    . Multiple Vitamin (MULTIVITAMIN) tablet Take 1 tablet by mouth daily.    . Omega-3 Fatty Acids (FISH OIL) 1200 MG CAPS Take 1 capsule by mouth daily.    Marland Kitchen omeprazole (PRILOSEC) 20 MG capsule TAKE 1 CAPSULE EVERY DAY 90 capsule 1  . sertraline (ZOLOFT) 100 MG tablet TAKE 1 TABLET (100 MG TOTAL) BY MOUTH DAILY. 90 tablet 0  . vitamin E (VITAMIN E) 400 UNIT capsule Take 400 Units by mouth daily.     No current facility-administered medications for this visit.    Allergies:  Penicillins   Social History: The patient  reports that she quit smoking about 13 years ago. Her smoking use included cigarettes. She started smoking about 57 years ago. She has a 50.00 pack-year smoking history. She quit smokeless tobacco use about 13 years ago. She reports that she does not drink alcohol or use drugs.   ROS:  Please see the history of present illness. Otherwise, complete review of systems is positive for hearing loss.  All other systems are reviewed and negative.   Physical Exam: VS:  BP (!) 168/58   Pulse 79   Ht _0  (1.6 m)   Wt 175 lb (79.4 kg)   SpO2 (!) 89%   BMI 31.00 kg/m , BMI Body mass index is 31 kg/m.  Wt Readings from Last 3 Encounters:  02/16/18 175 lb (79.4 kg)  01/18/18 174 lb (78.9 kg)  01/04/18 173 lb 12.8 oz (78.8 kg)    General: Patient appears comfortable at rest. HEENT: Conjunctiva and lids normal, oropharynx clear. Neck: Supple, no elevated JVP or  carotid bruits, no thyromegaly. Lungs: Decreased breath sounds without wheezing, nonlabored breathing at rest. Cardiac: Regular rate and rhythm, no S3, 2/6 systolic murmur. Abdomen: Soft, nontender, bowel sounds present. Extremities: No pitting edema, distal pulses 2+. Skin: Warm and dry. Musculoskeletal: No kyphosis. Neuropsychiatric: Alert and oriented x3, affect grossly appropriate.  ECG: I personally reviewed the tracing from 01/04/2018 which showed atrial flutter with variable conduction and right bundle branch block.  Recent Labwork: 06/16/2017: Hemoglobin 10.2; Platelets 212; TSH 1.95 01/04/2018: ALT 16;  AST 20; BUN 14; Creat 1.03; Potassium 4.3; Sodium 144     Component Value Date/Time   CHOL 116 06/16/2017 0959   TRIG 182 (H) 06/16/2017 0959   HDL 27 (L) 06/16/2017 0959   CHOLHDL 4.3 06/16/2017 0959   VLDL 49 (H) 03/30/2016 1206   LDLCALC 63 06/16/2017 0959    Other Studies Reviewed Today:  [facility] Myoview 05/09/2016:  There was no ST segment deviation noted during stress.  The study is normal. No ischemia or scar.  This is a low risk study.  Nuclear stress EF: 78%.  TEE 03/11/2015: Study Conclusions  - Left ventricle: Systolic function was normal. The estimated ejection fraction was in the range of 50% to 55%. Wall motion was normal; there were no regional wall motion abnormalities. - Aortic valve: No evidence of vegetation. No evidence of vegetation. - Aorta: The aorta was normal, not dilated, there were moderate non-mobile calcifications. - Mitral valve: Mildly calcified annulus. Mildly thickened leaflets . There was mild regurgitation. - Left atrium: The atrium was dilated. No evidence of thrombus in the atrial cavity or appendage. No evidence of thrombus in the atrial cavity or appendage. No evidence of thrombus in the atrial cavity or appendage. The appendage was morphologically a left appendage, multilobulated, and of normal size.  Emptying velocity were decreased. - Right atrium: No evidence of thrombus in the atrial cavity or appendage. - Atrial septum: There was increased thickness of the septum, consistent with lipomatous hypertrophy.  Impressions:  - TEE did not identify any source of intracardiac thrmbus. A successful cardioversion followed the TEE.  Assessment and Plan:  1.  Paroxysmal atrial fibrillation/flutter with CHADSVASC score of 4.  She is in sinus rhythm today and continues on Eliquis, atenolol, Cardizem CD, and flecainide.  She reports recent symptoms that could be pulmonary in etiology as discussed above.  We will check a CBC to exclude progressive anemia as component of her shortness of breath, also echocardiogram to reassess LVEF.  A PA and lateral chest x-ray will be obtained.  2.  Essential hypertension, blood pressure is elevated today.  She reports compliance with her medications.  3.  Mixed hyperlipidemia on Lipitor.  She continues to follow with Dr. Nevada Crane, last LDL was 63.  4.  Asymptomatic moderate carotid artery disease.  Current medicines were reviewed with the patient today.   Orders Placed This Encounter  Procedures  . DG Chest 2 View  . CBC  . EKG 12-Lead  . ECHOCARDIOGRAM COMPLETE    Disposition: Call with test results and determine next step.  Signed, Satira Sark, MD, [facility] 02/16/2018 2:31 PM    [place] [facility] [address], [place], [place] [address] Phone: [phone]; Fax: [phone]

## 2018-02-16 ENCOUNTER — Encounter: Payer: Self-pay | Admitting: Cardiology

## 2018-02-16 ENCOUNTER — Other Ambulatory Visit (HOSPITAL_COMMUNITY)
Admission: RE | Admit: 2018-02-16 | Discharge: 2018-02-16 | Disposition: A | Discharge status: Home / Self Care | Payer: Medicare HMO | Source: Ambulatory Visit | Attending: Cardiology | Admitting: Cardiology

## 2018-02-16 ENCOUNTER — Ambulatory Visit (HOSPITAL_COMMUNITY)
Admission: RE | Admit: 2018-02-16 | Discharge: 2018-02-16 | Disposition: A | Discharge status: Home / Self Care | Payer: Medicare HMO | Source: Ambulatory Visit | Attending: Cardiology | Admitting: Cardiology

## 2018-02-16 ENCOUNTER — Ambulatory Visit: Payer: Medicare HMO | Admitting: Cardiology

## 2018-02-16 ENCOUNTER — Encounter (HOSPITAL_COMMUNITY): Payer: Self-pay

## 2018-02-16 VITALS — BP 168/58 | HR 79 | Ht 63.0 in | Wt 175.0 lb

## 2018-02-16 DIAGNOSIS — I6523 Occlusion and stenosis of bilateral carotid arteries: Secondary | ICD-10-CM

## 2018-02-16 DIAGNOSIS — R0602 Shortness of breath: Secondary | ICD-10-CM

## 2018-02-16 DIAGNOSIS — E782 Mixed hyperlipidemia: Secondary | ICD-10-CM | POA: Diagnosis not present

## 2018-02-16 DIAGNOSIS — I480 Paroxysmal atrial fibrillation: Secondary | ICD-10-CM | POA: Diagnosis not present

## 2018-02-16 DIAGNOSIS — I10 Essential (primary) hypertension: Secondary | ICD-10-CM

## 2018-02-16 LAB — CBC
HCT: 32.4 % — ABNORMAL LOW (ref 36.0–46.0)
Hemoglobin: 10 g/dL — ABNORMAL LOW (ref 12.0–15.0)
MCH: 29.2 pg (ref 26.0–34.0)
MCHC: 30.9 g/dL (ref 30.0–36.0)
MCV: 94.5 fL (ref 80.0–100.0)
Platelets: 194 10*3/uL (ref 150–400)
RBC: 3.43 MIL/uL — ABNORMAL LOW (ref 3.87–5.11)
RDW: 14 % (ref 11.5–15.5)
WBC: 6.4 10*3/uL (ref 4.0–10.5)
nRBC: 0 % (ref 0.0–0.2)

## 2018-02-16 NOTE — Patient Instructions (Signed)
Medication Instructions: Your physician recommends that you continue on your current medications as directed. Please refer to the Current Medication list given to you today.   Labwork: CBC today  Procedures/Testing: Get chest x-ray today  Your physician has requested that you have an echocardiogram. Echocardiography is a painless test that uses sound waves to create images of your heart. It provides your doctor with information about the size and shape of your heart and how well your heart's chambers and valves are working. This procedure takes approximately one hour. There are no restrictions for this procedure.    Follow-Up: We will call you with results   Any Additional Special Instructions Will Be Listed Below (If Applicable).     If you need a refill on your cardiac medications before your next appointment, please call your pharmacy.

## 2018-02-19 ENCOUNTER — Telehealth: Payer: Self-pay

## 2018-02-19 MED ORDER — FUROSEMIDE 20 MG PO TABS: ORAL_TABLET | ORAL | 0 refills | Status: DC

## 2018-02-19 NOTE — Telephone Encounter (Signed)
[id number]

## 2018-02-19 NOTE — Telephone Encounter (Signed)
Pt called back, will take lasix 20 mg daily and call back at end of week

## 2018-02-19 NOTE — Telephone Encounter (Signed)
-----  Message from Satira Sark, MD sent at 02/19/2018  8:07 AM EST ----- Results reviewed.  Chest x-ray indicates mild interstitial edema.  Follow-up echocardiogram is pending for reassessment of LVEF.  In the meanwhile let's give her a prescription for Lasix 20 mg to be taken daily for the next week, see if this makes any difference in how she is feeling, not sure that she will need to stay on a diuretic long-term however. A copy of this test should be forwarded to [name], MD.

## 2018-02-23 ENCOUNTER — Ambulatory Visit (HOSPITAL_COMMUNITY)
Admission: RE | Admit: 2018-02-23 | Discharge: 2018-02-23 | Disposition: A | Discharge status: Home / Self Care | Payer: Medicare HMO | Source: Ambulatory Visit | Attending: Cardiology | Admitting: Cardiology

## 2018-02-23 DIAGNOSIS — R0602 Shortness of breath: Secondary | ICD-10-CM | POA: Insufficient documentation

## 2018-02-23 NOTE — Progress Notes (Signed)
*  PRELIMINARY RESULTS* Echocardiogram 2D Echocardiogram has been performed.  Ellen Jordan 02/23/2018, 11:18 AM

## 2018-02-26 ENCOUNTER — Telehealth: Payer: Self-pay

## 2018-02-26 DIAGNOSIS — R0602 Shortness of breath: Secondary | ICD-10-CM

## 2018-02-26 NOTE — Telephone Encounter (Signed)
Pt felt somewhat better on lasix.She spoke with Dr.Hall's office and they placed her on Torsemide 20 mg BID   I placed ref to Dr.Hawkins

## 2018-02-26 NOTE — Telephone Encounter (Signed)
-----  Message from Satira Sark, MD sent at 02/23/2018 12:55 PM EST ----- Results reviewed.  LVEF is normal at 60 to 65%.  There is diastolic dysfunction and therefore I would check to see how she did with the limited course of Lasix in terms of shortness of breath overall.  There is mild mitral stenosis which is unlikely to be causing symptoms, however she does have evidence of pulmonary hypertension which I wonder could be potentially related to underlying chronic lung disease.  Please make a referral for her to see Dr. Luan Pulling for formal pulmonary evaluation.  If she has felt better on Lasix, please let me know and we can sort out a longer term prescription. A copy of this test should be forwarded to [name], MD.

## 2018-03-08 ENCOUNTER — Encounter: Payer: Self-pay | Admitting: Cardiology

## 2018-04-02 ENCOUNTER — Other Ambulatory Visit (HOSPITAL_COMMUNITY): Payer: Self-pay | Admitting: Respiratory Therapy

## 2018-04-02 DIAGNOSIS — J441 Chronic obstructive pulmonary disease with (acute) exacerbation: Secondary | ICD-10-CM

## 2018-04-15 ENCOUNTER — Other Ambulatory Visit: Payer: Self-pay | Admitting: "Endocrinology

## 2018-04-15 ENCOUNTER — Other Ambulatory Visit: Payer: Self-pay | Admitting: Cardiology

## 2018-05-08 ENCOUNTER — Other Ambulatory Visit (HOSPITAL_COMMUNITY): Payer: Self-pay | Admitting: Pulmonary Disease

## 2018-05-08 DIAGNOSIS — R0602 Shortness of breath: Secondary | ICD-10-CM

## 2018-05-15 ENCOUNTER — Other Ambulatory Visit: Payer: Self-pay | Admitting: "Endocrinology

## 2018-05-15 ENCOUNTER — Other Ambulatory Visit: Payer: Self-pay | Admitting: Cardiology

## 2018-05-15 MED ORDER — FLECAINIDE ACETATE 100 MG PO TABS: 100.0000 mg | ORAL_TABLET | Freq: Two times a day (BID) | ORAL | 3 refills | Status: DC

## 2018-05-15 NOTE — Telephone Encounter (Signed)
Refilled flecainide

## 2018-05-15 NOTE — Telephone Encounter (Signed)
*  1. Which medications need to be refilled? (please list name of each medication and dose if known)  flecainide (TAMBOCOR) 100 MG    2. Which pharmacy/location (including street and city if local pharmacy) is medication to be sent to? [facility], [place]    3. Do they need a 30 day or 90 day supply?  Patient is out of her medication .

## 2018-05-17 ENCOUNTER — Other Ambulatory Visit: Payer: Self-pay

## 2018-05-17 MED ORDER — INSULIN ASPART 100 UNIT/ML ~~LOC~~ SOLN: 10.0000 [IU] | Freq: Three times a day (TID) | SUBCUTANEOUS | 2 refills | Status: DC

## 2018-05-23 ENCOUNTER — Telehealth: Payer: Self-pay | Admitting: "Endocrinology

## 2018-05-23 NOTE — Telephone Encounter (Signed)
Pts insulin prescription assistance faxed 3 times w/ fax confirmation each time.

## 2018-06-03 DIAGNOSIS — J449 Chronic obstructive pulmonary disease, unspecified: Secondary | ICD-10-CM | POA: Diagnosis not present

## 2018-06-03 DIAGNOSIS — R0602 Shortness of breath: Secondary | ICD-10-CM | POA: Diagnosis not present

## 2018-06-12 ENCOUNTER — Ambulatory Visit (HOSPITAL_COMMUNITY): Payer: 59

## 2018-06-19 DIAGNOSIS — J441 Chronic obstructive pulmonary disease with (acute) exacerbation: Secondary | ICD-10-CM | POA: Diagnosis not present

## 2018-06-19 DIAGNOSIS — R0602 Shortness of breath: Secondary | ICD-10-CM | POA: Diagnosis not present

## 2018-06-19 DIAGNOSIS — I959 Hypotension, unspecified: Secondary | ICD-10-CM | POA: Diagnosis not present

## 2018-06-20 ENCOUNTER — Ambulatory Visit (HOSPITAL_COMMUNITY): Payer: 59

## 2018-06-20 ENCOUNTER — Other Ambulatory Visit: Payer: Self-pay

## 2018-06-20 ENCOUNTER — Encounter (HOSPITAL_COMMUNITY): Payer: Self-pay

## 2018-07-03 ENCOUNTER — Other Ambulatory Visit: Payer: Self-pay | Admitting: Internal Medicine

## 2018-07-03 DIAGNOSIS — R0602 Shortness of breath: Secondary | ICD-10-CM

## 2018-07-03 DIAGNOSIS — R079 Chest pain, unspecified: Secondary | ICD-10-CM

## 2018-07-04 DIAGNOSIS — R0602 Shortness of breath: Secondary | ICD-10-CM | POA: Diagnosis not present

## 2018-07-04 DIAGNOSIS — J449 Chronic obstructive pulmonary disease, unspecified: Secondary | ICD-10-CM | POA: Diagnosis not present

## 2018-07-10 ENCOUNTER — Ambulatory Visit (HOSPITAL_COMMUNITY): Payer: Medicare HMO

## 2018-07-14 ENCOUNTER — Other Ambulatory Visit: Payer: Self-pay | Admitting: Cardiology

## 2018-07-19 DIAGNOSIS — J441 Chronic obstructive pulmonary disease with (acute) exacerbation: Secondary | ICD-10-CM | POA: Diagnosis not present

## 2018-07-20 ENCOUNTER — Encounter (HOSPITAL_COMMUNITY): Payer: Self-pay

## 2018-07-20 ENCOUNTER — Other Ambulatory Visit: Payer: Self-pay

## 2018-07-20 ENCOUNTER — Ambulatory Visit (HOSPITAL_COMMUNITY)
Admission: RE | Admit: 2018-07-20 | Discharge: 2018-07-20 | Disposition: A | Discharge status: Home / Self Care | Payer: Medicare HMO | Source: Ambulatory Visit | Attending: Internal Medicine | Admitting: Internal Medicine

## 2018-07-20 DIAGNOSIS — R918 Other nonspecific abnormal finding of lung field: Secondary | ICD-10-CM | POA: Diagnosis not present

## 2018-07-20 DIAGNOSIS — R0602 Shortness of breath: Secondary | ICD-10-CM | POA: Insufficient documentation

## 2018-07-20 DIAGNOSIS — R079 Chest pain, unspecified: Secondary | ICD-10-CM | POA: Diagnosis not present

## 2018-07-20 DIAGNOSIS — I2510 Atherosclerotic heart disease of native coronary artery without angina pectoris: Secondary | ICD-10-CM | POA: Diagnosis not present

## 2018-07-20 LAB — POCT I-STAT CREATININE: Creatinine, Ser: 1.3 mg/dL — ABNORMAL HIGH (ref 0.44–1.00)

## 2018-07-20 MED ORDER — IOHEXOL 300 MG/ML  SOLN
75.0000 mL | Freq: Once | INTRAMUSCULAR | Status: AC | PRN
  Administered 2018-07-20: 60 mL via INTRAVENOUS

## 2018-07-23 DIAGNOSIS — E1165 Type 2 diabetes mellitus with hyperglycemia: Secondary | ICD-10-CM | POA: Diagnosis not present

## 2018-07-23 DIAGNOSIS — Z794 Long term (current) use of insulin: Secondary | ICD-10-CM | POA: Diagnosis not present

## 2018-07-23 LAB — BASIC METABOLIC PANEL
BUN: 21 (ref 4–21)
Creatinine: 1.3 — AB (ref 0.5–1.1)

## 2018-07-23 LAB — HEMOGLOBIN A1C: Hemoglobin A1C: 6.4

## 2018-07-23 LAB — TSH: TSH: 2.3 (ref 0.41–5.90)

## 2018-07-23 LAB — VITAMIN D 25 HYDROXY (VIT D DEFICIENCY, FRACTURES): Vit D, 25-Hydroxy: 64

## 2018-07-24 DIAGNOSIS — N183 Chronic kidney disease, stage 3 (moderate): Secondary | ICD-10-CM | POA: Diagnosis not present

## 2018-07-24 DIAGNOSIS — E785 Hyperlipidemia, unspecified: Secondary | ICD-10-CM | POA: Diagnosis not present

## 2018-07-24 DIAGNOSIS — I129 Hypertensive chronic kidney disease with stage 1 through stage 4 chronic kidney disease, or unspecified chronic kidney disease: Secondary | ICD-10-CM | POA: Diagnosis not present

## 2018-07-24 DIAGNOSIS — E039 Hypothyroidism, unspecified: Secondary | ICD-10-CM | POA: Diagnosis not present

## 2018-07-24 LAB — LIPID PANEL
Cholesterol: 118 (ref 0–200)
HDL: 35 (ref 35–70)
LDL Cholesterol: 64
Triglycerides: 96 (ref 40–160)

## 2018-07-26 ENCOUNTER — Other Ambulatory Visit (HOSPITAL_COMMUNITY)
Admission: RE | Admit: 2018-07-26 | Discharge: 2018-07-26 | Disposition: A | Discharge status: Home / Self Care | Payer: Medicare HMO | Source: Ambulatory Visit | Attending: Pulmonary Disease | Admitting: Pulmonary Disease

## 2018-07-26 ENCOUNTER — Encounter (HOSPITAL_COMMUNITY): Payer: Self-pay

## 2018-07-26 ENCOUNTER — Other Ambulatory Visit: Payer: Self-pay

## 2018-07-26 ENCOUNTER — Encounter (HOSPITAL_COMMUNITY)
Admission: RE | Admit: 2018-07-26 | Discharge: 2018-07-26 | Disposition: A | Discharge status: Home / Self Care | Payer: Medicare HMO | Source: Ambulatory Visit | Attending: Pulmonary Disease | Admitting: Pulmonary Disease

## 2018-07-26 DIAGNOSIS — Z1159 Encounter for screening for other viral diseases: Secondary | ICD-10-CM | POA: Insufficient documentation

## 2018-07-26 HISTORY — DX: Unspecified atrial fibrillation: I48.91

## 2018-07-26 LAB — SARS CORONAVIRUS 2 (TAT 6-24 HRS): SARS Coronavirus 2: NEGATIVE

## 2018-07-26 NOTE — Progress Notes (Signed)
Dr. Luan Pulling notified in regards to pt taking Eliquis. Pt is to stop now and resume after procedure. Pt verbalizes understanding but has already had 1 dose today.

## 2018-07-26 NOTE — Patient Instructions (Signed)
Ellen Jordan  07/26/2018     _0 @   Your procedure is scheduled on Monday, 07/30/18.  Report to [facility] at [phone] A.M.  Call this number if you have problems the morning of surgery:  [phone]   Remember:  Do not eat or drink after midnight.     Take these medicines the morning of surgery with A SIP OF WATER atenolol, albuterol, flecainide, omeprazole, zoloft, lisinopril, 1/2 of Levemir the night before (25 units)    Do not wear jewelry, make-up or nail polish.  Do not wear lotions, powders, or perfumes, or deodorant.  Do not shave 48 hours prior to surgery.  Men may shave face and neck.  Do not bring valuables to the hospital.  [facility] is not responsible for any belongings or valuables.  Contacts, dentures or bridgework may not be worn into surgery.  Leave your suitcase in the car.  After surgery it may be brought to your room.  For patients admitted to the hospital, discharge time will be determined by your treatment team.  Patients discharged the day of surgery will not be allowed to drive home.   Name and phone number of your driver:   family Special instructions:  Any instructions that Dr. Luan Pulling has given you.  Please read over the following fact sheets that you were given. Coughing and Deep Breathing, Surgical Site Infection Prevention, Anesthesia Post-op Instructions and Care and Recovery After Surgery      Flexible Bronchoscopy  Flexible bronchoscopy is a procedure that is used to examine the passageways in the lungs. During the procedure, a thin, flexible tool with a camera on it (bronchoscope) is passed into the mouth or nose, down through the windpipe (trachea), and into the air tubes (bronchi) in the lungs. This tool allows your health care provider to look at your lungs from the inside and take testing (diagnostic) samples if needed. Tell a health care provider about:  Any allergies you have.  All medicines you are taking, including  vitamins, herbs, eye drops, creams, and over-the-counter medicines.  Any problems you or family members have had with anesthetic medicines.  Any blood disorders you have.  Any surgeries you have had.  Any medical conditions you have.  Whether you are pregnant or may be pregnant. What are the risks? Generally, this is a safe procedure. However, problems may occur, including:  Infection.  Bleeding.  Damage to other structures or organs.  Allergic reactions to medicines.  Collapsed lung (pneumothorax).  Increased need for oxygen or difficulty breathing after the procedure. What happens before the procedure? Medicines Ask your health care provider about:  Changing or stopping your regular medicines. This is especially important if you are taking diabetes medicines or blood thinners.  Taking medicines such as aspirin and ibuprofen. These medicines can thin your blood. Do not take these medicines before your procedure if your health care provider instructs you not to. You may be given antibiotic medicine to help prevent infection. Staying hydrated Follow instructions from your health care provider about hydration, which may include:  Up to 2 hours before the procedure - you may continue to drink clear liquids, such as water, clear fruit juice, black coffee, and plain tea. Eating and drinking Follow instructions from your health care provider about eating and drinking, which may include:  8 hours before the procedure - stop eating heavy meals or foods such as meat, fried foods, or fatty foods.  6 hours before the procedure -  stop eating light meals or foods, such as toast or cereal.  6 hours before the procedure - stop drinking milk or drinks that contain milk.  2 hours before the procedure - stop drinking clear liquids. General instructions  Plan to have someone take you home from the hospital or clinic.  If you will be going home right after the procedure, plan to have  someone with you for 24 hours. What happens during the procedure?  To lower your risk of infection: ? Your health care team will wash or sanitize their hands. ? Your skin will be washed with soap.  An IV tube will be inserted into one of your veins.  You will be given a medicine (local anesthetic) to numb your mouth, nose, throat, and voice box (larynx). You may also be given one or more of the following: ? A medicine to help you relax (sedative). ? A medicine to control coughing. ? A medicine to dry up any fluids in your lungs (secretions).  A bronchoscope will be passed into your nose or mouth, and into your lungs. Your health care provider will examine your lungs.  Samples of airway secretions may be collected for testing.  If abnormal areas are seen in your airways, tissue samples may be removed for examination under a microscope (biopsy).  If tissue samples are needed from the outer parts of the lung, a type of X-ray (fluoroscopy) may be used to guide the bronchoscope to these areas.  If bleeding occurs, you may be given medicine to stop or decrease the bleeding. The procedure may vary among health care providers and hospitals. What happens after the procedure?  Do not drive for 24 hours if you were given a sedative.  Your blood pressure, heart rate, breathing rate, and blood oxygen level will be monitored until the medicines you were given have worn off.  You may have a chest X-ray to check for signs of pneumothorax.  You will not be allowed to eat or drink anything for 2 hours after your procedure.  If a biopsy was taken, it is up to you to get the results of your procedure. Ask your health care provider, or the department that is doing the procedure, when your results will be ready. Summary  Flexible bronchoscopy is a procedure that allows your health care provider to look closely at your lungs from the inside and take testing (diagnostic) samples if needed.  Risks of  flexible bronchoscopy include bleeding, infection, and pneumothorax.  Before a flexible bronchoscopy, you will be given a medicine (local anesthetic) to numb your mouth, nose, throat, and voice box (larynx). Then, a bronchoscope will be passed into your nose or mouth, and into your lungs.  After the procedure, your blood pressure, heart rate, breathing rate, and blood oxygen level will be monitored until the medicines you were given have worn off. You may have a chest X-ray to check for signs of pneumothorax.  You will not be allowed to eat or drink anything for 2 hours after your procedure. This information is not intended to replace advice given to you by your health care provider. Make sure you discuss any questions you have with your health care provider. Document Released: 12/25/1999 Document Revised: 12/09/2016 Document Reviewed: 01/30/2016 Elsevier Patient Education  2020 [place]. Flexible Bronchoscopy, Care After This sheet gives you information about how to care for yourself after your test. Your doctor may also give you more specific instructions. If you have problems or questions, contact  your doctor. Follow these instructions at home: Eating and drinking  Do not eat or drink anything (not even water) for 2 hours after your test, or until your numbing medicine (local anesthetic) wears off.  When your numbness is gone and your cough and gag reflexes have come back, you may: ? Eat only soft foods. ? Slowly drink liquids.  The day after the test, go back to your normal diet. Driving  Do not drive for 24 hours if you were given a medicine to help you relax (sedative).  Do not drive or use heavy machinery while taking prescription pain medicine. General instructions   Take over-the-counter and prescription medicines only as told by your doctor.  Return to your normal activities as told. Ask what activities are safe for you.  Do not use any products that have nicotine or  tobacco in them. This includes cigarettes and e-cigarettes. If you need help quitting, ask your doctor.  Keep all follow-up visits as told by your doctor. This is important. It is very important if you had a tissue sample (biopsy) taken. Get help right away if:  You have shortness of breath that gets worse.  You get light-headed.  You feel like you are going to pass out (faint).  You have chest pain.  You cough up: ? More than a little blood. ? More blood than before. Summary  Do not eat or drink anything (not even water) for 2 hours after your test, or until your numbing medicine wears off.  Do not use cigarettes. Do not use e-cigarettes.  Get help right away if you have chest pain. This information is not intended to replace advice given to you by your health care provider. Make sure you discuss any questions you have with your health care provider. Document Released: 10/24/2008 Document Revised: 12/09/2016 Document Reviewed: 01/15/2016 Elsevier Patient Education  2020 [place] Anesthesia is a term that refers to techniques, procedures, and medicines that help a person stay safe and comfortable during a medical procedure. Monitored anesthesia care, or sedation, is one type of anesthesia. Your anesthesia specialist may recommend sedation if you will be having a procedure that does not require you to be unconscious, such as:  Cataract surgery.  A dental procedure.  A biopsy.  A colonoscopy. During the procedure, you may receive a medicine to help you relax (sedative). There are three levels of sedation:  Mild sedation. At this level, you may feel awake and relaxed. You will be able to follow directions.  Moderate sedation. At this level, you will be sleepy. You may not remember the procedure.  Deep sedation. At this level, you will be asleep. You will not remember the procedure. The more medicine you are given, the deeper your level of  sedation will be. Depending on how you respond to the procedure, the anesthesia specialist may change your level of sedation or the type of anesthesia to fit your needs. An anesthesia specialist will monitor you closely during the procedure. Let your health care provider know about:  Any allergies you have.  All medicines you are taking, including vitamins, herbs, eye drops, creams, and over-the-counter medicines.  Any use of steroids (by mouth or as a cream).  Any problems you or family members have had with sedatives and anesthetic medicines.  Any blood disorders you have.  Any surgeries you have had.  Any medical conditions you have, such as sleep apnea.  Whether you are pregnant or may be pregnant.  Any use of cigarettes, alcohol, or street drugs. What are the risks? Generally, this is a safe procedure. However, problems may occur, including:  Getting too much medicine (oversedation).  Nausea.  Allergic reaction to medicines.  Trouble breathing. If this happens, a breathing tube may be used to help with breathing. It will be removed when you are awake and breathing on your own.  Heart trouble.  Lung trouble. Before the procedure Staying hydrated Follow instructions from your health care provider about hydration, which may include:  Up to 2 hours before the procedure - you may continue to drink clear liquids, such as water, clear fruit juice, black coffee, and plain tea. Eating and drinking restrictions Follow instructions from your health care provider about eating and drinking, which may include:  8 hours before the procedure - stop eating heavy meals or foods such as meat, fried foods, or fatty foods.  6 hours before the procedure - stop eating light meals or foods, such as toast or cereal.  6 hours before the procedure - stop drinking milk or drinks that contain milk.  2 hours before the procedure - stop drinking clear liquids. Medicines Ask your health care  provider about:  Changing or stopping your regular medicines. This is especially important if you are taking diabetes medicines or blood thinners.  Taking medicines such as aspirin and ibuprofen. These medicines can thin your blood. Do not take these medicines before your procedure if your health care provider instructs you not to. Tests and exams  You will have a physical exam.  You may have blood tests done to show: ? How well your kidneys and liver are working. ? How well your blood can clot. General instructions  Plan to have someone take you home from the hospital or clinic.  If you will be going home right after the procedure, plan to have someone with you for 24 hours.  What happens during the procedure?  Your blood pressure, heart rate, breathing, level of pain and overall condition will be monitored.  An IV tube will be inserted into one of your veins.  Your anesthesia specialist will give you medicines as needed to keep you comfortable during the procedure. This may mean changing the level of sedation.  The procedure will be performed. After the procedure  Your blood pressure, heart rate, breathing rate, and blood oxygen level will be monitored until the medicines you were given have worn off.  Do not drive for 24 hours if you received a sedative.  You may: ? Feel sleepy, clumsy, or nauseous. ? Feel forgetful about what happened after the procedure. ? Have a sore throat if you had a breathing tube during the procedure. ? Vomit. This information is not intended to replace advice given to you by your health care provider. Make sure you discuss any questions you have with your health care provider. Document Released: 09/22/2004 Document Revised: 12/09/2016 Document Reviewed: 04/19/2015 Elsevier Patient Education  2020 [employer].

## 2018-07-27 ENCOUNTER — Other Ambulatory Visit (HOSPITAL_COMMUNITY): Payer: Medicare HMO

## 2018-07-30 ENCOUNTER — Ambulatory Visit: Payer: Medicare HMO | Admitting: "Endocrinology

## 2018-07-30 ENCOUNTER — Other Ambulatory Visit: Payer: Self-pay

## 2018-07-30 ENCOUNTER — Ambulatory Visit (HOSPITAL_COMMUNITY): Payer: Medicare HMO | Admitting: Anesthesiology

## 2018-07-30 ENCOUNTER — Ambulatory Visit (HOSPITAL_COMMUNITY)
Admission: RE | Admit: 2018-07-30 | Discharge: 2018-07-30 | Disposition: A | Discharge status: Home / Self Care | Payer: Medicare HMO | Attending: Pulmonary Disease | Admitting: Pulmonary Disease

## 2018-07-30 ENCOUNTER — Encounter (HOSPITAL_COMMUNITY): Payer: Self-pay | Admitting: Anesthesiology

## 2018-07-30 ENCOUNTER — Encounter (HOSPITAL_COMMUNITY)
Admission: RE | Disposition: A | Discharge status: Home / Self Care | Payer: Self-pay | Source: Home / Self Care | Attending: Pulmonary Disease

## 2018-07-30 DIAGNOSIS — Z794 Long term (current) use of insulin: Secondary | ICD-10-CM | POA: Diagnosis not present

## 2018-07-30 DIAGNOSIS — E1140 Type 2 diabetes mellitus with diabetic neuropathy, unspecified: Secondary | ICD-10-CM | POA: Diagnosis not present

## 2018-07-30 DIAGNOSIS — K219 Gastro-esophageal reflux disease without esophagitis: Secondary | ICD-10-CM | POA: Insufficient documentation

## 2018-07-30 DIAGNOSIS — J449 Chronic obstructive pulmonary disease, unspecified: Secondary | ICD-10-CM | POA: Insufficient documentation

## 2018-07-30 DIAGNOSIS — E782 Mixed hyperlipidemia: Secondary | ICD-10-CM | POA: Diagnosis not present

## 2018-07-30 DIAGNOSIS — Z79899 Other long term (current) drug therapy: Secondary | ICD-10-CM | POA: Diagnosis not present

## 2018-07-30 DIAGNOSIS — R918 Other nonspecific abnormal finding of lung field: Secondary | ICD-10-CM | POA: Insufficient documentation

## 2018-07-30 DIAGNOSIS — Z8542 Personal history of malignant neoplasm of other parts of uterus: Secondary | ICD-10-CM | POA: Insufficient documentation

## 2018-07-30 DIAGNOSIS — G2581 Restless legs syndrome: Secondary | ICD-10-CM | POA: Diagnosis not present

## 2018-07-30 DIAGNOSIS — Z87891 Personal history of nicotine dependence: Secondary | ICD-10-CM | POA: Insufficient documentation

## 2018-07-30 DIAGNOSIS — R0689 Other abnormalities of breathing: Secondary | ICD-10-CM | POA: Diagnosis not present

## 2018-07-30 DIAGNOSIS — Z7901 Long term (current) use of anticoagulants: Secondary | ICD-10-CM | POA: Diagnosis not present

## 2018-07-30 DIAGNOSIS — F419 Anxiety disorder, unspecified: Secondary | ICD-10-CM | POA: Diagnosis not present

## 2018-07-30 DIAGNOSIS — I10 Essential (primary) hypertension: Secondary | ICD-10-CM | POA: Insufficient documentation

## 2018-07-30 DIAGNOSIS — R0602 Shortness of breath: Secondary | ICD-10-CM | POA: Diagnosis not present

## 2018-07-30 DIAGNOSIS — Z9981 Dependence on supplemental oxygen: Secondary | ICD-10-CM | POA: Diagnosis not present

## 2018-07-30 DIAGNOSIS — I480 Paroxysmal atrial fibrillation: Secondary | ICD-10-CM | POA: Diagnosis not present

## 2018-07-30 HISTORY — PX: FLEXIBLE BRONCHOSCOPY: SHX5094

## 2018-07-30 LAB — GLUCOSE, CAPILLARY: Glucose-Capillary: 196 mg/dL — ABNORMAL HIGH (ref 70–99)

## 2018-07-30 SURGERY — BRONCHOSCOPY, FLEXIBLE
Anesthesia: General

## 2018-07-30 MED ORDER — KETAMINE HCL 50 MG/5ML IJ SOSY
PREFILLED_SYRINGE | INTRAMUSCULAR | Status: AC
  Filled 2018-07-30: qty 5

## 2018-07-30 MED ORDER — LACTATED RINGERS IV SOLN
INTRAVENOUS | Status: DC | PRN
  Administered 2018-07-30: 07:00:00 via INTRAVENOUS

## 2018-07-30 MED ORDER — PROPOFOL 10 MG/ML IV BOLUS
INTRAVENOUS | Status: AC
  Filled 2018-07-30: qty 60

## 2018-07-30 MED ORDER — LIDOCAINE HCL (PF) 2 % IJ SOLN
INTRAMUSCULAR | Status: AC
  Filled 2018-07-30: qty 20

## 2018-07-30 MED ORDER — LIDOCAINE HCL (CARDIAC) PF 100 MG/5ML IV SOSY
PREFILLED_SYRINGE | INTRAVENOUS | Status: DC | PRN
  Administered 2018-07-30: 40 mg via INTRAVENOUS

## 2018-07-30 MED ORDER — PROPOFOL 10 MG/ML IV BOLUS
INTRAVENOUS | Status: AC
  Filled 2018-07-30: qty 20

## 2018-07-30 MED ORDER — 0.9 % SODIUM CHLORIDE (POUR BTL) OPTIME
TOPICAL | Status: DC | PRN
  Administered 2018-07-30: 100 mL

## 2018-07-30 MED ORDER — HYDROMORPHONE HCL 1 MG/ML IJ SOLN: 0.2500 mg | INTRAMUSCULAR | Status: DC | PRN

## 2018-07-30 MED ORDER — KETAMINE HCL 10 MG/ML IJ SOLN
INTRAMUSCULAR | Status: DC | PRN
  Administered 2018-07-30: 10 mg via INTRAVENOUS
  Administered 2018-07-30 (×2): 5 mg via INTRAVENOUS

## 2018-07-30 MED ORDER — LIDOCAINE HCL (PF) 2 % IJ SOLN
INTRAMUSCULAR | Status: DC | PRN
  Administered 2018-07-30: 8 mg via INTRADERMAL

## 2018-07-30 MED ORDER — HYDROCODONE-ACETAMINOPHEN 7.5-325 MG PO TABS: 1.0000 | ORAL_TABLET | Freq: Once | ORAL | Status: DC | PRN

## 2018-07-30 MED ORDER — PROMETHAZINE HCL 25 MG/ML IJ SOLN: 6.2500 mg | INTRAMUSCULAR | Status: DC | PRN

## 2018-07-30 MED ORDER — PROPOFOL 500 MG/50ML IV EMUL
INTRAVENOUS | Status: DC | PRN
  Administered 2018-07-30: 75 ug/kg/min via INTRAVENOUS

## 2018-07-30 MED ORDER — MIDAZOLAM HCL 2 MG/2ML IJ SOLN: 0.5000 mg | Freq: Once | INTRAMUSCULAR | Status: DC | PRN

## 2018-07-30 MED ORDER — BUTAMBEN-TETRACAINE-BENZOCAINE 2-2-14 % EX AERO
INHALATION_SPRAY | CUTANEOUS | Status: AC
  Filled 2018-07-30: qty 5

## 2018-07-30 MED ORDER — LIDOCAINE VISCOUS HCL 2 % MT SOLN
OROMUCOSAL | Status: DC | PRN
  Administered 2018-07-30: 1 via OROMUCOSAL

## 2018-07-30 MED ORDER — LIDOCAINE VISCOUS HCL 2 % MT SOLN
OROMUCOSAL | Status: AC
  Filled 2018-07-30: qty 15

## 2018-07-30 MED ORDER — GLYCOPYRROLATE 0.2 MG/ML IJ SOLN
INTRAMUSCULAR | Status: DC | PRN
  Administered 2018-07-30: 0.2 mg via INTRAVENOUS

## 2018-07-30 SURGICAL SUPPLY — 16 items
BRUSH CYTOL CELLEBRITY 1.5X140 (MISCELLANEOUS) ×3 IMPLANT
CLOTH BEACON ORANGE TIMEOUT ST (SAFETY) ×3 IMPLANT
CONNECTOR 5 IN 1 STRAIGHT STRL (MISCELLANEOUS) ×3 IMPLANT
FORCEPS BIOP RJ4 1.8 (CUTTING FORCEPS) ×3 IMPLANT
GLOVE BIO SURGEON STRL SZ7.5 (GLOVE) ×3 IMPLANT
KIT CLEAN CATCH URINE (SET/KITS/TRAYS/PACK) IMPLANT
MARKER SKIN DUAL TIP RULER LAB (MISCELLANEOUS) ×3 IMPLANT
NS IRRIG 1000ML POUR BTL (IV SOLUTION) ×3 IMPLANT
SPONGE GAUZE 4X4 12PLY (GAUZE/BANDAGES/DRESSINGS) ×3 IMPLANT
SYR 20CC LL (SYRINGE) ×3 IMPLANT
SYR 30ML LL (SYRINGE) ×3 IMPLANT
SYR CONTROL 10ML LL (SYRINGE) ×3 IMPLANT
TRAP SPECIMEN CP (MISCELLANEOUS) ×3 IMPLANT
VALVE DISPOSABLE (MISCELLANEOUS) ×3 IMPLANT
WATER STERILE IRR 1000ML POUR (IV SOLUTION) ×3 IMPLANT
YANKAUER SUCT BULB TIP 10FT TU (MISCELLANEOUS) ×6 IMPLANT

## 2018-07-30 NOTE — Addendum Note (Signed)
Addendum  created 07/30/18 0803 by Mickel Baas, CRNA   Intraprocedure Meds edited

## 2018-07-30 NOTE — Transfer of Care (Signed)
Immediate Anesthesia Transfer of Care Note  Patient: Ellen Jordan  Procedure(s) Performed: FLEXIBLE BRONCHOSCOPY (N/A )  Patient Location: PACU  Anesthesia Type:General  Level of Consciousness: awake, alert , oriented and patient cooperative  Airway & Oxygen Therapy: Patient Spontanous Breathing  Post-op Assessment: Report given to RN and Post -op Vital signs reviewed and stable  Post vital signs: Reviewed and stable  Last Vitals:  Vitals Value Taken Time  BP 134/57 07/30/18 0750  Temp    Pulse 64 07/30/18 0754  Resp 17 07/30/18 0754  SpO2 93 % 07/30/18 0754  Vitals shown include unvalidated device data.  Last Pain:  Vitals:   07/30/18 0731  PainSc: 8          Complications: No apparent anesthesia complications

## 2018-07-30 NOTE — H&P (Signed)
Ellen Jordan MRN: [id number] DOB/AGE: 1945-05-22 73 y.o. Primary Care Physician:Hall, Edwinna Areola, MD Admit date: 07/30/2018 Chief Complaint: Abnormal chest x-ray HPI: This is a 73 year old who underwent CT scan of the chest was suggested that she might have pulmonary Mycobacterium avium complex disease.  She is known to have severe COPD.  She is been off and on antibiotics multiple times in the last year.  She is undergoing bronchoscopy to get specimens.  In addition to her COPD she is known to have hypertension, paroxysmal atrial fib, restless leg syndrome, allergies, anxiety.  She is still short of breath and coughing.  She is complaining of some pain in the left side of her neck today.  Past Medical History:  Diagnosis Date  . Allergic rhinitis   . Allergy   . Anxiety   . Atrial fibrillation ([place])   . Carpal tunnel syndrome    left  . Cataract   . Depression   . Diabetic neuropathy ([place])   . Essential hypertension   . GERD (gastroesophageal reflux disease)   . Mixed hyperlipidemia   . PAF (paroxysmal atrial fibrillation) ([place])    a. s/p TEE cardioversion March 2017. b. Recurrence by event monitor 04/2016.  Marland Kitchen RBBB   . Restless leg syndrome   . Type 2 diabetes mellitus ([place])   . Uterine cancer [facility])    Past Surgical History:  Procedure Laterality Date  . ABDOMINAL HYSTERECTOMY     uterine cancer  . BACK SURGERY    . CARDIOVERSION N/A 03/11/2015   Procedure: CARDIOVERSION;  Surgeon: [name], MD;  Location: [place];  Service: Cardiovascular;  Laterality: N/A;  . CHOLECYSTECTOMY    . EYE SURGERY     cataracts  . SPINE SURGERY    . TEE WITHOUT CARDIOVERSION N/A 03/11/2015   Procedure: TRANSESOPHAGEAL ECHOCARDIOGRAM (TEE);  Surgeon: [name], MD;  Location: [facility] ENDOSCOPY;  Service: Cardiovascular;  Laterality: N/A;        Family History  Problem Relation Age of Onset  . Heart disease Mother 66  . Hyperlipidemia Mother   . Diabetes Brother   . COPD Father  95       emphysema  . Hypertension Father   . Hyperlipidemia Father   . Diabetes Daughter   . AAA (abdominal aortic aneurysm) Son   . Diabetes Brother   . Diabetes Brother   . Diabetes Brother     Social History:  reports that she quit smoking about 13 years ago. Her smoking use included cigarettes. She started smoking about 57 years ago. She has a 50.00 pack-year smoking history. She quit smokeless tobacco use about 13 years ago. She reports that she does not drink alcohol or use drugs.   Allergies:  Allergies  Allergen Reactions  . Penicillins Hives    Has patient had a PCN reaction causing immediate rash, facial/tongue/throat swelling, SOB or lightheadedness with hypotension: Yes Has patient had a PCN reaction causing severe rash involving mucus membranes or skin necrosis: No Has patient had a PCN reaction that required hospitalization No Has patient had a PCN reaction occurring within the last 10 years: No If all of the above answers are "NO", then may proceed with Cephalosporin use.     Medications Prior to Admission  Medication Sig Dispense Refill  . albuterol (PROVENTIL HFA;VENTOLIN HFA) 108 (90 Base) MCG/ACT inhaler Inhale 2 puffs into the lungs every 6 (six) hours as needed for wheezing or shortness of breath. 1 Inhaler 0  . apixaban (  ELIQUIS) 5 MG TABS tablet Take 1 tablet (5 mg total) by mouth 2 (two) times daily. 60 tablet 3  . atenolol (TENORMIN) 50 MG tablet TAKE 1 TABLET EVERY DAY 90 tablet 0  . atorvastatin (LIPITOR) 40 MG tablet TAKE 1 TABLET EVERY DAY 90 tablet 3  . b complex vitamins tablet Take 1 tablet by mouth daily.    . Biotin 5000 MCG CAPS Take 1 capsule by mouth daily.    . Calcium-Phosphorus-Vitamin D (CALCIUM GUMMIES PO) Take 500-1,000 mg by mouth daily.    . chlorpheniramine (EQ CHLORTABS) 4 MG tablet Take 4 mg by mouth 2 (two) times daily as needed for allergies.    . cholecalciferol (VITAMIN D) 1000 units tablet Take 1,000 Units by mouth daily.    Marland Kitchen  diltiazem (CARDIZEM CD) 180 MG 24 hr capsule Take 1 capsule (180 mg total) by mouth daily. 90 capsule 3  . diphenhydrAMINE (BENADRYL) 25 MG tablet Take 25 mg by mouth daily.    . flecainide (TAMBOCOR) 100 MG tablet Take 1 tablet (100 mg total) by mouth 2 (two) times daily. 180 tablet 3  . insulin aspart (NOVOLOG) 100 UNIT/ML injection Inject 10-16 Units into the skin 3 (three) times daily with meals. 60 mL 2  . insulin detemir (LEVEMIR) 100 UNIT/ML injection Inject 0.5 mLs (50 Units total) into the skin at bedtime. 60 mL 1  . ipratropium-albuterol (DUONEB) 0.5-2.5 (3) MG/3ML SOLN Take 3 mLs by nebulization every 6 (six) hours as needed (wheezing/ shortness of breath).    . liraglutide (VICTOZA) 18 MG/3ML SOPN Inject 0.3 mLs (1.8 mg total) into the skin daily. 27 mL 1  . lisinopril (PRINIVIL,ZESTRIL) 20 MG tablet TAKE 1 TABLET (20 MG TOTAL) BY MOUTH DAILY. 90 tablet 0  . Misc Natural Products (ESTROVEN + ENERGY MAX STRENGTH) TABS Take 1 tablet by mouth daily.    . Multiple Vitamin (MULTIVITAMIN) tablet Take 1 tablet by mouth daily.    . Omega-3 Fatty Acids (FISH OIL) 1200 MG CAPS Take 1 capsule by mouth daily.    Marland Kitchen omeprazole (PRILOSEC) 20 MG capsule TAKE 1 CAPSULE EVERY DAY 90 capsule 1  . sertraline (ZOLOFT) 100 MG tablet TAKE 1 TABLET (100 MG TOTAL) BY MOUTH DAILY. 90 tablet 0  . torsemide (DEMADEX) 20 MG tablet Take 20 mg by mouth daily.    . vitamin E (VITAMIN E) 400 UNIT capsule Take 400 Units by mouth daily.    Marland Kitchen ACCU-CHEK FASTCLIX LANCETS MISC CHECK BLOOD GLUCOSE FOUR TIMES DAILY 408 each 2  . ACCU-CHEK GUIDE test strip USE AS INSTRUCTED FOUR TIMES DAILY 150 each 5  . ACCU-CHEK SMARTVIEW test strip CHECK BLOOD GLUCOSE FOUR TIMES DAILY 400 each 2  . Alcohol Swabs (B-D SINGLE USE SWABS REGULAR) PADS USE FOUR TIMES DAILY 400 each 2  . Blood Glucose Monitoring Suppl (ACCU-CHEK GUIDE) w/Device KIT 1 each by Does not apply route 4 (four) times daily. 1 kit 0  . glucose blood (ACCU-CHEK GUIDE)  test strip Use as instructed 4 x daily 400 each 1       JAS:NKNLZ from the symptoms mentioned above,there are no other symptoms referable to all systems reviewed.  Other than as mentioned 10 point review of systems is negative  Physical Exam: Blood pressure (!) 138/52, pulse (!) 52, temperature 98.3 F (36.8 C), resp. rate 18, SpO2 97 %. Constitutional: She is awake and alert and in no acute distress.  Eyes: Pupils react.  EOMI.  Ears nose mouth and throat: Mucous  membranes are moist.  Hearing is grossly normal.  Cardiovascular: Her heart is regular now I cannot tell if she is in atrial fib or not.  No gallop.  Respiratory: Lungs show bilateral rhonchi.  Gastrointestinal: Her abdomen is soft without masses.  Musculoskeletal: Grossly normal strength bilaterally.  Neurological: No focal abnormalities.  Psychiatric: Normal mood and affect   No results for input(s): WBC, NEUTROABS, HGB, HCT, MCV, PLT in the last 72 hours. No results for input(s): NA, K, CL, CO2, GLUCOSE, BUN, CREATININE, CALCIUM, MG in the last 72 hours.  Invalid input(s): PHOlablast2(ast:2,ALT:2,alkphos:2,bilitot:2,prot:2,albumin:2)@    Recent Results (from the past 240 hour(s))  SARS Coronavirus 2 (Performed in [place] hospital lab)     Status: None   Collection Time: 07/26/18  7:07 AM   Specimen: Nasal Swab  Result Value Ref Range Status   SARS Coronavirus 2 NEGATIVE NEGATIVE Final    Comment: (NOTE) SARS-CoV-2 target nucleic acids are NOT DETECTED. The SARS-CoV-2 RNA is generally detectable in upper and lower respiratory specimens during the acute phase of infection. Negative results do not preclude SARS-CoV-2 infection, do not rule out co-infections with other pathogens, and should not be used as the sole basis for treatment or other patient management decisions. Negative results must be combined with clinical observations, patient history, and epidemiological information. The expected result is  Negative. Fact Sheet for Patients: SugarRoll.be Fact Sheet for Healthcare Providers: https://www.woods-mathews.com/ This test is not yet approved or cleared by the [place] FDA and  has been authorized for detection and/or diagnosis of SARS-CoV-2 by FDA under an Emergency Use Authorization (EUA). This EUA will remain  in effect (meaning this test can be used) for the duration of the COVID-19 declaration under Section 56 4(b)(1) of the Act, 21 U.S.C. section 360bbb-3(b)(1), unless the authorization is terminated or revoked sooner. Performed at [facility], [place] [address] [place], [place] [address]      Ct Chest W Contrast  Result Date: 07/20/2018 CLINICAL DATA:  Chest pain, pleurisy or effusion suspected, shortness of breath follow-up chest radiographs 02/16/1998 mid EXAM: CT CHEST WITH CONTRAST TECHNIQUE: Multidetector CT imaging of the chest was performed during intravenous contrast administration. CONTRAST:  1m OMNIPAQUE IOHEXOL 300 MG/ML  SOLN COMPARISON:  Chest radiograph, 02/16/2018 FINDINGS: Cardiovascular: Aortic atherosclerosis. Normal heart size. Three-vessel coronary artery calcifications. No pericardial effusion. Mediastinum/Nodes: No enlarged mediastinal, hilar, or axillary lymph nodes. Thyroid gland, trachea, and esophagus demonstrate no significant findings. Lungs/Pleura: Multiple bilateral pulmonary nodules and clustered nodular opacities. The largest discrete pulmonary nodule measures 8 mm in the right pulmonary apex (series 4, image 26). There are additional irregular opacities and clustered nodules, for example in the superior segment left lower lobe (series 4, image 52) and in the lateral segment right middle lobe (series 4, image 87). Mild bibasilar scarring. No pleural effusion or pneumothorax. Upper Abdomen: No acute abnormality. Musculoskeletal: No chest wall mass or suspicious bone lesions identified. IMPRESSION: 1.  Multiple bilateral pulmonary nodules and clustered nodular opacities. The largest discrete pulmonary nodule measures 8 mm in the right pulmonary apex (series 4, image 26). There are additional irregular opacities and clustered nodules, for example in the superior segment left lower lobe (series 4, image 52) and in the lateral segment right middle lobe (series 4, image 87). Overall constellation of findings generally suggests atypical infection, including atypical mycobacterium, however specific follow-up at 3-6 months is warranted for pulmonary nodules by Fleischner Society criteria. 2.  Coronary artery disease and aortic atherosclerosis. Electronically Signed  By: Eddie Candle M.D.   On: 07/20/2018 10:53   Impression: She has abnormal chest CT suggesting atypical infection.  She is undergoing bronchoscopy for culture.  She has COPD at baseline  She has atrial fib but I cannot tell right now she is in atrial fib or not by exam.  She has been off her Eliquis since the 16th Active Problems:   * No active hospital problems. *     Plan: For bronchoscopy      Alonza Bogus   07/30/2018, 7:22 AM

## 2018-07-30 NOTE — Anesthesia Preprocedure Evaluation (Signed)
Anesthesia Evaluation  Patient identified by MRN, date of birth, ID band Patient awake    Reviewed: Allergy & Precautions, NPO status , Patient's Chart, lab work & pertinent test results  Airway Mallampati: III  TM Distance: >3 FB Neck ROM: Full    Dental no notable dental hx. (+) Edentulous Upper, Edentulous Lower   Pulmonary COPD,  COPD inhaler and oxygen dependent, former smoker,  Wears 2 l o2 [place] at home -very limited ET -here for bronch    Pulmonary exam normal breath sounds clear to auscultation       Cardiovascular Exercise Tolerance: Poor hypertension, Pt. on medications Normal cardiovascular exam+ dysrhythmias Atrial Fibrillation II Rhythm:Regular Rate:Normal  Appears sinus today  No h/o recent DC CV  States husband cares for her  Limited ET  Denies CP   Neuro/Psych Anxiety Depression  Neuromuscular disease negative psych ROS   GI/Hepatic Neg liver ROS, GERD  Medicated and Controlled,  Endo/Other  negative endocrine ROSdiabetes, Type 1, Insulin Dependent  Renal/GU negative Renal ROS  negative genitourinary   Musculoskeletal negative musculoskeletal ROS (+)   Abdominal   Peds negative pediatric ROS (+)  Hematology negative hematology ROS (+)   Anesthesia Other Findings   Reproductive/Obstetrics negative OB ROS                             Anesthesia Physical Anesthesia Plan  ASA: IV  Anesthesia Plan: General   Post-op Pain Management:    Induction: Intravenous  PONV Risk Score and Plan: 3 and Ondansetron, Propofol infusion, TIVA and Treatment may vary due to age or medical condition  Airway Management Planned: Nasal Cannula and Simple Face Mask  Additional Equipment:   Intra-op Plan:   Post-operative Plan: Extubation in OR  Informed Consent: I have reviewed the patients History and Physical, chart, labs and discussed the procedure including the risks, benefits and  alternatives for the proposed anesthesia with the patient or authorized representative who has indicated his/her understanding and acceptance.     Dental advisory given  Plan Discussed with: CRNA  Anesthesia Plan Comments: (Pl;an Full PPE use  Plan GA with GETA as needed -WTP with same  D/w pt unlikely need for postop ventilation- WTP)        Anesthesia Quick Evaluation

## 2018-07-30 NOTE — Anesthesia Postprocedure Evaluation (Signed)
Anesthesia Post Note  Patient: Ellen Jordan  Procedure(s) Performed: FLEXIBLE BRONCHOSCOPY (N/A )  Anesthesia Type: General Level of consciousness: awake and alert and oriented Pain management: pain level controlled Vital Signs Assessment: post-procedure vital signs reviewed and stable Respiratory status: spontaneous breathing Cardiovascular status: stable Postop Assessment: no apparent nausea or vomiting Anesthetic complications: no     Last Vitals:  Vitals:   07/30/18 0640 07/30/18 0750  BP: (!) 138/52 (!) 134/57  Pulse: (!) 52 81  Resp: 18 18  Temp: 36.8 C (P) 36.6 C  SpO2: 97%     Last Pain:  Vitals:   07/30/18 0731  PainSc: 8                  Layann Bluett A

## 2018-07-30 NOTE — Anesthesia Procedure Notes (Signed)
Procedure Name: General with mask airway Date/Time: 07/30/2018 7:26 AM Performed by: Andree Elk Taleah Bellantoni A, CRNA Pre-anesthesia Checklist: Patient identified, Emergency Drugs available, Suction available, Timeout performed and Patient being monitored Patient Re-evaluated:Patient Re-evaluated prior to induction Oxygen Delivery Method: Non-rebreather mask

## 2018-07-30 NOTE — Discharge Instructions (Signed)
Bronchospasm, Adult  Bronchospasm is when airways in the lungs get smaller. When this happens, it can be hard to breathe. You may cough. You may also make a whistling sound when you breathe (wheeze). Follow these instructions at home: Medicines  Take over-the-counter and prescription medicines only as told by your doctor.  If you need to use an inhaler or nebulizer to take your medicine, ask your doctor how to use it.  If you were given a spacer, always use it with your inhaler. Lifestyle  Change your heating and air conditioning filter. Do this at least once a month.  Try not to use fireplaces and wood stoves.  Do not  smoke. Do not  allow smoking in your home.  Try not to use things that have a strong smell, like perfume.  Get rid of pests (such as roaches and mice) and their poop.  Remove any mold from your home.  Keep your house clean. Get rid of dust.  Use cleaning products that have no smell.  Replace carpet with wood, tile, or vinyl flooring.  Use allergy-proof pillows, mattress covers, and box spring covers.  Wash bed sheets and blankets every week. Use hot water. Dry them in a dryer.  Use blankets that are made of polyester or cotton.  Wash your hands often.  Keep pets out of your bedroom.  When you exercise, try not to breathe in cold air. General instructions  Have a plan for getting medical care. Know these things: ? When to call your doctor. ? When to call local emergency services (911 in the [place] ? Where to go in an emergency.  Stay up to date on your shots (immunizations).  When you have an episode: ? Stay calm. ? Relax. ? Breathe slowly. Contact a doctor if:  Your muscles ache.  Your chest hurts.  The color of the mucus you cough up (sputum) changes from clear or white to yellow, green, gray, or bloody.  The mucus you cough up gets thicker.  You have a fever. Get help right away if:  The whistling sound gets worse, even after you  take your medicines.  Your coughing gets worse.  You find it even harder to breathe.  Your chest hurts very much. Summary  Bronchospasm is when airways in the lungs get smaller.  When this happens, it can be hard to breathe. You may cough. You may also make a whistling sound when you breathe.  Stay away from things that cause you to have episodes. These include smoke or dust. This information is not intended to replace advice given to you by your health care provider. Make sure you discuss any questions you have with your health care provider. Document Released: 10/24/2008 Document Revised: 12/09/2016 Document Reviewed: 12/31/2015 Elsevier Patient Education  2020 [employer].

## 2018-07-30 NOTE — Op Note (Signed)
Bronchoscopy Procedure Note Ellen Jordan [id number] May 01, 1945  Procedure: Bronchoscopy Indications: Obtain specimens for culture and/or other diagnostic studies  Procedure Details Consent: Risks of procedure as well as the alternatives and risks of each were explained to the (patient/caregiver).  Consent for procedure obtained. Time Out: Verified patient identification, verified procedure, site/side was marked, verified correct patient position, special equipment/implants available, medications/allergies/relevent history reviewed, required imaging and test results available.  Performed  In preparation for procedure, patient was given 100% FiO2 and bronchoscope lubricated. Sedation: Propofol per anesthesia  Airway entered and the following bronchi were examined: RUL, RML, RLL, LUL, LLL and Bronchi.   Procedures performed: Bronchial washings done bilaterally Bronchoscope removed.    Evaluation Hemodynamic Status: BP stable throughout; O2 sats: transiently fell during during procedure Patient's Current Condition: stable Specimens:  Sent serosanguinous fluid Complications: No apparent complications Patient did tolerate procedure well.  Bronchial washing sent for cytology and culture Alonza Bogus 07/30/2018

## 2018-08-03 DIAGNOSIS — J449 Chronic obstructive pulmonary disease, unspecified: Secondary | ICD-10-CM | POA: Diagnosis not present

## 2018-08-03 DIAGNOSIS — R0602 Shortness of breath: Secondary | ICD-10-CM | POA: Diagnosis not present

## 2018-08-06 ENCOUNTER — Encounter (HOSPITAL_COMMUNITY): Payer: Self-pay | Admitting: Pulmonary Disease

## 2018-08-15 ENCOUNTER — Other Ambulatory Visit: Payer: Self-pay

## 2018-08-15 ENCOUNTER — Encounter: Payer: Self-pay | Admitting: "Endocrinology

## 2018-08-15 ENCOUNTER — Ambulatory Visit (INDEPENDENT_AMBULATORY_CARE_PROVIDER_SITE_OTHER): Payer: Medicare HMO | Admitting: "Endocrinology

## 2018-08-15 DIAGNOSIS — E782 Mixed hyperlipidemia: Secondary | ICD-10-CM | POA: Diagnosis not present

## 2018-08-15 DIAGNOSIS — E1165 Type 2 diabetes mellitus with hyperglycemia: Secondary | ICD-10-CM | POA: Diagnosis not present

## 2018-08-15 DIAGNOSIS — IMO0001 Reserved for inherently not codable concepts without codable children: Secondary | ICD-10-CM

## 2018-08-15 DIAGNOSIS — I10 Essential (primary) hypertension: Secondary | ICD-10-CM | POA: Diagnosis not present

## 2018-08-15 DIAGNOSIS — Z794 Long term (current) use of insulin: Secondary | ICD-10-CM

## 2018-08-15 MED ORDER — VICTOZA 18 MG/3ML ~~LOC~~ SOPN: 1.8000 mg | PEN_INJECTOR | Freq: Every day | SUBCUTANEOUS | 1 refills | Status: DC

## 2018-08-15 NOTE — Progress Notes (Signed)
08/15/2018                                                    Endocrinology Telehealth Visit Follow up Note -During COVID -19 Pandemic  This visit type was conducted due to national recommendations for restrictions regarding the COVID-19 Pandemic  in an effort to limit this patient's exposure and mitigate transmission of the corona virus.  Due to her co-morbid illnesses, Ellen Jordan is at  moderate to high risk for complications without adequate follow up.  This format is felt to be most appropriate for her at this time.  I connected with this patient on 08/15/2018   by telephone and verified that I am speaking with the correct person using two identifiers. Ellen Jordan, [birth year]. she has verbally consented to this visit. All issues noted in this document were discussed and addressed. The format was not optimal for physical exam.     Subjective:    Patient ID: Ellen Jordan, female    DOB: December 23, 1945, PCP Ellen Squibb, MD   Past Medical History:  Diagnosis Date  . Allergic rhinitis   . Allergy   . Anxiety   . Atrial fibrillation ([place])   . Carpal tunnel syndrome    left  . Cataract   . Depression   . Diabetic neuropathy ([place])   . Essential hypertension   . GERD (gastroesophageal reflux disease)   . Mixed hyperlipidemia   . PAF (paroxysmal atrial fibrillation) ([place])    a. s/p TEE cardioversion March 2017. b. Recurrence by event monitor 04/2016.  Marland Kitchen RBBB   . Restless leg syndrome   . Type 2 diabetes mellitus ([place])   . Uterine cancer [facility])    Past Surgical History:  Procedure Laterality Date  . ABDOMINAL HYSTERECTOMY     uterine cancer  . BACK SURGERY    . CARDIOVERSION N/A 03/11/2015   Procedure: CARDIOVERSION;  Surgeon: [name], MD;  Location: [place];  Service: Cardiovascular;  Laterality: N/A;  . CHOLECYSTECTOMY    . EYE SURGERY     cataracts  . FLEXIBLE BRONCHOSCOPY N/A 07/30/2018   Procedure: FLEXIBLE BRONCHOSCOPY;  Surgeon: Sinda Du, MD;  [facility];  Service: Cardiopulmonary;  Laterality: N/A;  . SPINE SURGERY    . TEE WITHOUT CARDIOVERSION N/A 03/11/2015   Procedure: TRANSESOPHAGEAL ECHOCARDIOGRAM (TEE);  Surgeon: [name], MD;  Location: [facility] END[facility];  Service: Cardiovascular;  Laterality: N/A;   Social History   Socioeconomic History  . Marital status: Married    Spouse name: earl  . Number of children: 2  . Years of education: 73  . Highest education level: Not on file  Occupational History  . Occupation: retired  Scientific laboratory technician  . Financial resource strain: Not on file  . Food insecurity    Worry: Not on file    Inability: Not on file  . Transportation needs    Medical: Not on file    Non-medical: Not on file  Tobacco Use  . Smoking status: Former Smoker    Packs/day: 1.00    Years: 50.00    Pack years: 50.00    Types: Cigarettes    Start date: 01/10/1961    Quit date: 01/10/2005    Years since quitting: 13.6  . Smokeless tobacco: Former Leisure centre manager  date: 01/10/2005  Substance and Sexual Activity  . Alcohol use: No    Alcohol/week: 0.0 standard drinks  . Drug use: No  . Sexual activity: Not Currently    Partners: Male  Lifestyle  . Physical activity    Days per week: Not on file    Minutes per session: Not on file  . Stress: Not on file  Relationships  . Social Herbalist on phone: Not on file    Gets together: Not on file    Attends religious service: Not on file    Active member of club or organization: Not on file    Attends meetings of clubs or organizations: Not on file    Relationship status: Not on file  Other Topics Concern  . Not on file  Social History Narrative   Moved to this area in 99 from [place].    Married for over 50 years. Has two children. Lives with husband Ellen Jordan (earl)   Likes to read   Retired from [employer].   Enjoys time with family, beach, read, exericse.   Walk dog.   Eats all food groups.   Wear seatbelt.   Wear sunscreen.    Outpatient Encounter Medications as of 08/15/2018  Medication Sig  . ACCU-CHEK FASTCLIX LANCETS MISC CHECK BLOOD GLUCOSE FOUR TIMES DAILY  . ACCU-CHEK GUIDE test strip USE AS INSTRUCTED FOUR TIMES DAILY  . ACCU-CHEK SMARTVIEW test strip CHECK BLOOD GLUCOSE FOUR TIMES DAILY  . albuterol (PROVENTIL HFA;VENTOLIN HFA) 108 (90 Base) MCG/ACT inhaler Inhale 2 puffs into the lungs every 6 (six) hours as needed for wheezing or shortness of breath.  . Alcohol Swabs (B-D SINGLE USE SWABS REGULAR) PADS USE FOUR TIMES DAILY  . apixaban (ELIQUIS) 5 MG TABS tablet Take 1 tablet (5 mg total) by mouth 2 (two) times daily.  Marland Kitchen atenolol (TENORMIN) 50 MG tablet TAKE 1 TABLET EVERY DAY  . atorvastatin (LIPITOR) 40 MG tablet TAKE 1 TABLET EVERY DAY  . b complex vitamins tablet Take 1 tablet by mouth daily.  . Biotin 5000 MCG CAPS Take 1 capsule by mouth daily.  . Blood Glucose Monitoring Suppl (ACCU-CHEK GUIDE) w/Device KIT 1 each by Does not apply route 4 (four) times daily.  . Calcium-Phosphorus-Vitamin D (CALCIUM GUMMIES PO) Take 500-1,000 mg by mouth daily.  . chlorpheniramine (EQ CHLORTABS) 4 MG tablet Take 4 mg by mouth 2 (two) times daily as needed for allergies.  . cholecalciferol (VITAMIN D) 1000 units tablet Take 1,000 Units by mouth daily.  Marland Kitchen diltiazem (CARDIZEM CD) 180 MG 24 hr capsule Take 1 capsule (180 mg total) by mouth daily.  . diphenhydrAMINE (BENADRYL) 25 MG tablet Take 25 mg by mouth daily.  . flecainide (TAMBOCOR) 100 MG tablet Take 1 tablet (100 mg total) by mouth 2 (two) times daily.  Marland Kitchen glucose blood (ACCU-CHEK GUIDE) test strip Use as instructed 4 x daily  . insulin aspart (NOVOLOG) 100 UNIT/ML injection Inject 10-16 Units into the skin 3 (three) times daily with meals.  . insulin detemir (LEVEMIR) 100 UNIT/ML injection Inject 0.5 mLs (50 Units total) into the skin at bedtime.  Marland Kitchen ipratropium-albuterol (DUONEB) 0.5-2.5 (3) MG/3ML SOLN Take 3 mLs by nebulization every 6 (six) hours as needed  (wheezing/ shortness of breath).  . liraglutide (VICTOZA) 18 MG/3ML SOPN Inject 0.3 mLs (1.8 mg total) into the skin daily.  Marland Kitchen lisinopril (PRINIVIL,ZESTRIL) 20 MG tablet TAKE 1 TABLET (20 MG TOTAL) BY MOUTH DAILY.  Marland Kitchen Misc Natural Products (  ESTROVEN + ENERGY MAX STRENGTH) TABS Take 1 tablet by mouth daily.  . Multiple Vitamin (MULTIVITAMIN) tablet Take 1 tablet by mouth daily.  . Omega-3 Fatty Acids (FISH OIL) 1200 MG CAPS Take 1 capsule by mouth daily.  Marland Kitchen omeprazole (PRILOSEC) 20 MG capsule TAKE 1 CAPSULE EVERY DAY  . sertraline (ZOLOFT) 100 MG tablet TAKE 1 TABLET (100 MG TOTAL) BY MOUTH DAILY.  Marland Kitchen torsemide (DEMADEX) 20 MG tablet Take 20 mg by mouth daily.  . vitamin E (VITAMIN E) 400 UNIT capsule Take 400 Units by mouth daily.  . [DISCONTINUED] liraglutide (VICTOZA) 18 MG/3ML SOPN Inject 0.3 mLs (1.8 mg total) into the skin daily.   No facility-administered encounter medications on file as of 08/15/2018.    ALLERGIES: Allergies  Allergen Reactions  . Penicillins Hives    Has patient had a PCN reaction causing immediate rash, facial/tongue/throat swelling, SOB or lightheadedness with hypotension: Yes Has patient had a PCN reaction causing severe rash involving mucus membranes or skin necrosis: No Has patient had a PCN reaction that required hospitalization No Has patient had a PCN reaction occurring within the last 10 years: No If all of the above answers are "NO", then may proceed with Cephalosporin use.    VACCINATION STATUS: Immunization History  Administered Date(s) Administered  . Influenza,inj,Quad PF,6+ Mos 11/04/2016  . Pneumococcal Conjugate-13 11/30/2016  . Pneumococcal Polysaccharide-23 06/07/2017  . Td 06/07/2017    Diabetes She presents for her follow-up diabetic visit. She has type 2 diabetes mellitus. Onset time: She was diagnosed at approximate age of 56 years. Her disease course has been improving. There are no hypoglycemic associated symptoms. Pertinent negatives  for hypoglycemia include no confusion, headaches, pallor or seizures. There are no diabetic associated symptoms. Pertinent negatives for diabetes include no chest pain, no polydipsia, no polyphagia and no polyuria. There are no hypoglycemic complications. Symptoms are improving. There are no diabetic complications. Risk factors for coronary artery disease include diabetes mellitus, dyslipidemia, hypertension, obesity, sedentary lifestyle and tobacco exposure. Current diabetic treatment includes intensive insulin program. She is compliant with treatment most of the time. Her weight is stable. She is following a generally unhealthy diet. She has not had a previous visit with a dietitian (She declined referral to CDE.). Her home blood glucose trend is decreasing steadily. Her breakfast blood glucose range is generally 140-180 mg/dl. Her lunch blood glucose range is generally 140-180 mg/dl. Her dinner blood glucose range is generally 140-180 mg/dl. Her overall blood glucose range is 140-180 mg/dl. An ACE inhibitor/angiotensin II receptor blocker is being taken. She does not see a podiatrist.Eye exam is not current (She is overdue for eye exam, I advised her to schedule a visit to her eye doctor.).  Hyperlipidemia This is a chronic problem. The current episode started more than 1 year ago. The problem is controlled. Exacerbating diseases include diabetes and obesity. Pertinent negatives include no chest pain, leg pain, myalgias or shortness of breath. Current antihyperlipidemic treatment includes statins. Risk factors for coronary artery disease include dyslipidemia, diabetes mellitus, hypertension, a sedentary lifestyle and obesity.  Hypertension This is a chronic problem. The current episode started more than 1 year ago. The problem is controlled. Pertinent negatives include no chest pain, headaches, palpitations or shortness of breath. Risk factors for coronary artery disease include diabetes mellitus,  dyslipidemia, obesity, sedentary lifestyle and smoking/tobacco exposure. Past treatments include ACE inhibitors. The current treatment provides moderate improvement.    Review of systems: Limited as above.    Objective:  There were no vitals taken for this visit.  Wt Readings from Last 3 Encounters:  07/26/18 170 lb (77.1 kg)  02/16/18 175 lb (79.4 kg)  01/18/18 174 lb (78.9 kg)      Results for orders placed or performed in visit on 08/15/18  VITAMIN D 25 Hydroxy (Vit-D Deficiency, Fractures)  Result Value Ref Range   Vit D, 25-Hydroxy 64   Basic metabolic panel  Result Value Ref Range   BUN 21 4 - 21   Creatinine 1.3 (A) 0.5 - 1.1  Lipid panel  Result Value Ref Range   Triglycerides 96 40 - 160   Cholesterol 118 0 - 200   HDL 35 35 - 70   LDL Cholesterol 64   Hemoglobin A1c  Result Value Ref Range   Hemoglobin A1C 6.4   TSH  Result Value Ref Range   TSH 2.30 0.41 - 5.90   Lipid Panel     Component Value Date/Time   CHOL 118 07/23/2018   TRIG 96 07/23/2018   HDL 35 07/23/2018   CHOLHDL 4.3 06/16/2017 0959   VLDL 49 (H) 03/30/2016 1206   LDLCALC 64 07/23/2018   LDLCALC 63 06/16/2017 0959     Assessment & Plan:   1. Uncontrolled type 2 diabetes mellitus without complication, with long-term current use of insulin   - She remains at a high risk for more acute and chronic complications of diabetes which include CAD, CVA, CKD, retinopathy, and neuropathy. These are all discussed in detail with the patient.  Patient reports near target glycemic profile both fasting and postprandially.  Her previsit labs show A1c of 6.4% improving from 6.7%. overall improving from 14.6%.   - Glucose logs and insulin administration records pertaining to this visit,  to be scanned into patient's records.  Recent labs reviewed.   - I have re-counseled the patient on diet management  by adopting a carbohydrate restricted / protein rich  Diet.  - she  admits there is a room for  improvement in her diet and drink choices. -  Suggestion is made for her to avoid simple carbohydrates  from her diet including Cakes, Sweet Desserts / Pastries, Ice Cream, Soda (diet and regular), Sweet Tea, Candies, Chips, Cookies, Sweet Pastries,  Store Bought Juices, Alcohol in Excess of  1-2 drinks a day, Artificial Sweeteners, Coffee Creamer, and "Sugar-free" Products. This will help patient to have stable blood glucose profile and potentially avoid unintended weight gain.  - Patient is advised to stick to a routine mealtimes to eat 3 meals  a day and avoid unnecessary snacks ( to snack only to correct hypoglycemia).  - The patient  declined a referral to a  CDE for individualized DM education.  - I have approached patient with the following individualized plan to manage diabetes and patient agrees.  -Based on her stable glycemic profile, I advised her to continue on her current dosing regimen of insulin.  -She is advised to continue  Levemir 50 units nightly, continue NovoLog  10 units units   3 times a day before meals  , plus correction dose associated with monitoring of blood glucose 4 times a day-before meals and at bedtime.  - She is advised to continue Victoza at 1.8 mg subcutaneously daily .  - She would have benefited from continuous glucose monitoring.  Her insurance did not provide adequate coverage.    - Patient specific target  for A1c; LDL, HDL, Triglycerides, and  Waist Circumference were discussed in detail.  2) BP/HTN: she is advised to home monitor blood pressure and report if > 140/90 on 2 separate readings.    She is advised to continue her current medications including lisinopril 20 mg p.o. daily.    3) Lipids/HPL: Recent lipid panel revealed controlled LDL at 63. She is advised to continue atorvastatin 40 mg p.o. nightly.   4)  Weight/Diet: she has declined CDE consult, exercise, and carbohydrates information provided.  5) Chronic Care/Health  Maintenance:  -Patient is on ACEI/ARB and Statin medications and encouraged to continue to follow up with Ophthalmology (she is overdue for her eye exam), Podiatrist at least yearly or according to recommendations, and advised to  stay away from smoking. I have recommended yearly flu vaccine and pneumonia vaccination at least every 5 years; moderate intensity exercise for up to 150 minutes weekly; and  sleep for at least 7 hours a day.  -Foot exam is significant for bilateral peripheral neuropathy.  - I advised patient to maintain close follow up with Ellen Squibb, MD for primary care needs.  - Patient Care Time Today:  25 min, of which >50% was spent in  counseling and the rest reviewing her  current and  previous labs/studies, previous treatments, her blood glucose readings, and medications' doses and developing a plan for long-term care based on the latest recommendations for standards of care.   Ellen Jordan participated in the discussions, expressed understanding, and voiced agreement with the above plans.  All questions were answered to her satisfaction. she is encouraged to contact clinic should she have any questions or concerns prior to her return visit.  Follow up plan: -Return in about 6 months (around 02/15/2019) for Follow up with Pre-visit Labs, Meter, and Logs.  Glade Lloyd, MD Phone: [phone]  Fax: [phone]   -  This note was partially dictated with voice recognition software. Similar sounding words can be transcribed inadequately or may not  be corrected upon review.  08/15/2018, 1:18 PM

## 2018-08-19 DIAGNOSIS — J441 Chronic obstructive pulmonary disease with (acute) exacerbation: Secondary | ICD-10-CM | POA: Diagnosis not present

## 2018-08-20 DIAGNOSIS — J441 Chronic obstructive pulmonary disease with (acute) exacerbation: Secondary | ICD-10-CM | POA: Diagnosis not present

## 2018-09-03 DIAGNOSIS — J449 Chronic obstructive pulmonary disease, unspecified: Secondary | ICD-10-CM | POA: Diagnosis not present

## 2018-09-03 DIAGNOSIS — R0602 Shortness of breath: Secondary | ICD-10-CM | POA: Diagnosis not present

## 2018-09-05 ENCOUNTER — Other Ambulatory Visit: Payer: Self-pay | Admitting: Cardiology

## 2018-09-08 DIAGNOSIS — I272 Pulmonary hypertension, unspecified: Secondary | ICD-10-CM | POA: Insufficient documentation

## 2018-09-12 ENCOUNTER — Encounter: Payer: Self-pay | Admitting: Internal Medicine

## 2018-09-12 ENCOUNTER — Telehealth: Payer: Self-pay | Admitting: Internal Medicine

## 2018-09-12 ENCOUNTER — Ambulatory Visit (INDEPENDENT_AMBULATORY_CARE_PROVIDER_SITE_OTHER): Payer: Medicare HMO | Admitting: Internal Medicine

## 2018-09-12 ENCOUNTER — Other Ambulatory Visit: Payer: Self-pay

## 2018-09-12 VITALS — BP 112/60 | HR 55 | Temp 98.2°F | Ht 63.0 in | Wt 176.3 lb

## 2018-09-12 DIAGNOSIS — I272 Pulmonary hypertension, unspecified: Secondary | ICD-10-CM

## 2018-09-12 DIAGNOSIS — R9389 Abnormal findings on diagnostic imaging of other specified body structures: Secondary | ICD-10-CM

## 2018-09-12 DIAGNOSIS — R918 Other nonspecific abnormal finding of lung field: Secondary | ICD-10-CM

## 2018-09-12 DIAGNOSIS — Z79899 Other long term (current) drug therapy: Secondary | ICD-10-CM | POA: Diagnosis not present

## 2018-09-12 DIAGNOSIS — J9611 Chronic respiratory failure with hypoxia: Secondary | ICD-10-CM

## 2018-09-12 LAB — BRAIN NATRIURETIC PEPTIDE: Pro B Natriuretic peptide (BNP): 51 pg/mL (ref 0.0–100.0)

## 2018-09-12 LAB — SEDIMENTATION RATE: Sed Rate: 21 mm/hr (ref 0–30)

## 2018-09-12 LAB — CBC WITH DIFFERENTIAL/PLATELET
Basophils Absolute: 0 10*3/uL (ref 0.0–0.1)
Basophils Relative: 0.6 % (ref 0.0–3.0)
Eosinophils Absolute: 0.1 10*3/uL (ref 0.0–0.7)
Eosinophils Relative: 1.2 % (ref 0.0–5.0)
HCT: 32.9 % — ABNORMAL LOW (ref 36.0–46.0)
Hemoglobin: 11 g/dL — ABNORMAL LOW (ref 12.0–15.0)
Lymphocytes Relative: 15.1 % (ref 12.0–46.0)
Lymphs Abs: 1 10*3/uL (ref 0.7–4.0)
MCHC: 33.5 g/dL (ref 30.0–36.0)
MCV: 89.3 fl (ref 78.0–100.0)
Monocytes Absolute: 0.5 10*3/uL (ref 0.1–1.0)
Monocytes Relative: 7.9 % (ref 3.0–12.0)
Neutro Abs: 4.7 10*3/uL (ref 1.4–7.7)
Neutrophils Relative %: 75.2 % (ref 43.0–77.0)
Platelets: 141 10*3/uL — ABNORMAL LOW (ref 150.0–400.0)
RBC: 3.68 Mil/uL — ABNORMAL LOW (ref 3.87–5.11)
RDW: 15.4 % (ref 11.5–15.5)
WBC: 6.3 10*3/uL (ref 4.0–10.5)

## 2018-09-12 LAB — BASIC METABOLIC PANEL
BUN: 18 mg/dL (ref 6–23)
CO2: 33 mEq/L — ABNORMAL HIGH (ref 19–32)
Calcium: 9.4 mg/dL (ref 8.4–10.5)
Chloride: 98 mEq/L (ref 96–112)
Creatinine, Ser: 1.14 mg/dL (ref 0.40–1.20)
GFR: 46.48 mL/min — ABNORMAL LOW (ref >60.00)
Glucose, Bld: 191 mg/dL — ABNORMAL HIGH (ref 70–99)
Potassium: 3.8 mEq/L (ref 3.5–5.1)
Sodium: 140 mEq/L (ref 135–145)

## 2018-09-12 LAB — CREATININE KINASE MB: CK-MB: 1.4 ng/mL (ref 0.3–4.0)

## 2018-09-12 MED ORDER — BREO ELLIPTA 100-25 MCG/INH IN AEPB: 1.0000 | INHALATION_SPRAY | Freq: Every day | RESPIRATORY_TRACT | 0 refills | Status: DC

## 2018-09-12 NOTE — Progress Notes (Signed)
Synopsis: Referred in Sept 2020 for Abnormal imaging, Pulm HTN by [name], MD  Subjective:   PATIENT ID: Ellen Jordan GENDER: female DOB: September 14, 1945, MRN: [id number]  Chief Complaint  Patient presents with  . Consult    Referred by Dr. Luan Pulling for SOB. States this has going on for the past few months. Currently on 2L during the day and at night.     This is a 73 year old with a history of oxygen dependent ?COPD who is presenting for evaluation of pulmonary hypertension.  She was actually following with a Dr. Luan Pulling in [place].  A CT scan ordered by Dr. Luan Pulling revealed numerous subcentimeter pulmonary nodules with a question of MAC.  She underwent a bronchoscopy with BAL which did not show evidence of MAC.  At some point during her work-up she had an echocardiogram that showed pulmonary hypertension with an RVSP of 59.  She presents to pulmonary clinic for a second opinion.  Her dyspnea progressed over past year.  She was previously MMRC 0-1 on RA.  Now MMRC 1-2 on 2L O2.  Without O2 she quickly feels fatigued and oddly also gets back pains.  There is occasional wheezing but not consistently.  She has a runny nose but this has improved since she started O2.  Her cardiac history is a little unusual too.  She has refractory symptomatic paroxysmal afib s/p failed cardioversion now on flecainide that was titrated up around Sept 2019 due to   Comorbidities include paroxysmal atrial fibrillation on anticoagulation, diabetes.  Her home inhaler regimen seems to only be duo nebs PRN which work for a short while.  She has a father who had emphysema.  Patient has a 50-pack-year smoking history quit 13 years ago.  She may have had some asbestos exposure in distant past but otherwise no concerning exposure history.  Used to work as Consulting civil engineer.   Past Medical History:  Diagnosis Date  . Allergic rhinitis   . Allergy   . Anxiety   . Atrial fibrillation ([place])   . Carpal tunnel syndrome     left  . Cataract   . Depression   . Diabetic neuropathy ([place])   . Essential hypertension   . GERD (gastroesophageal reflux disease)   . Mixed hyperlipidemia   . PAF (paroxysmal atrial fibrillation) ([place])    a. s/p TEE cardioversion March 2017. b. Recurrence by event monitor 04/2016.  Marland Kitchen RBBB   . Restless leg syndrome   . Type 2 diabetes mellitus ([place])   . Uterine cancer ([place])      Family History  Problem Relation Age of Onset  . Heart disease Mother 44  . Hyperlipidemia Mother   . Diabetes Brother   . COPD Father 89       emphysema  . Hypertension Father   . Hyperlipidemia Father   . Diabetes Daughter   . AAA (abdominal aortic aneurysm) Son   . Diabetes Brother   . Diabetes Brother   . Diabetes Brother      Past Surgical History:  Procedure Laterality Date  . ABDOMINAL HYSTERECTOMY     uterine cancer  . BACK SURGERY    . CARDIOVERSION N/A 03/11/2015   Procedure: CARDIOVERSION;  Surgeon: [name], MD;  Location: [facility];  Service: Cardiovascular;  Laterality: N/A;  . CHOLECYSTECTOMY    . EYE SURGERY     cataracts  . FLEXIBLE BRONCHOSCOPY N/A 07/30/2018   Procedure: FLEXIBLE BRONCHOSCOPY;  Surgeon: Sinda Du, MD;  [facility];  Service: Cardiopulmonary;  Laterality: N/A;  . SPINE SURGERY    . TEE WITHOUT CARDIOVERSION N/A 03/11/2015   Procedure: TRANSESOPHAGEAL ECHOCARDIOGRAM (TEE);  Surgeon: [name], MD;  Location: [facility] ENDOSCOPY;  Service: Cardiovascular;  Laterality: N/A;    Social History   Socioeconomic History  . Marital status: Married    Spouse name: earl  . Number of children: 2  . Years of education: 5  . Highest education level: Not on file  Occupational History  . Occupation: retired  Scientific laboratory technician  . Financial resource strain: Not on file  . Food insecurity    Worry: Not on file    Inability: Not on file  . Transportation needs    Medical: Not on file    Non-medical: Not on file  Tobacco Use  . Smoking  status: Former Smoker    Packs/day: 1.00    Years: 50.00    Pack years: 50.00    Types: Cigarettes    Start date: 01/10/1961    Quit date: 01/10/2005    Years since quitting: 13.6  . Smokeless tobacco: Former Systems developer    Quit date: 01/10/2005  Substance and Sexual Activity  . Alcohol use: No    Alcohol/week: 0.0 standard drinks  . Drug use: No  . Sexual activity: Not Currently    Partners: Male  Lifestyle  . Physical activity    Days per week: Not on file    Minutes per session: Not on file  . Stress: Not on file  Relationships  . Social Herbalist on phone: Not on file    Gets together: Not on file    Attends religious service: Not on file    Active member of club or organization: Not on file    Attends meetings of clubs or organizations: Not on file    Relationship status: Not on file  . Intimate partner violence    Fear of current or ex partner: Not on file    Emotionally abused: Not on file    Physically abused: Not on file    Forced sexual activity: Not on file  Other Topics Concern  . Not on file  Social History Narrative   Moved to this area in 35 from [place].    Married for over 50 years. Has two children. Lives with husband [name] (earl)   Likes to read   Retired from [employer].   Enjoys time with family, beach, read, exericse.   Walk dog.   Eats all food groups.   Wear seatbelt.   Wear sunscreen.     Allergies  Allergen Reactions  . Penicillins Hives    Has patient had a PCN reaction causing immediate rash, facial/tongue/throat swelling, SOB or lightheadedness with hypotension: Yes Has patient had a PCN reaction causing severe rash involving mucus membranes or skin necrosis: No Has patient had a PCN reaction that required hospitalization No Has patient had a PCN reaction occurring within the last 10 years: No If all of the above answers are "NO", then may proceed with Cephalosporin use.      Outpatient Medications Prior to Visit   Medication Sig Dispense Refill  . ACCU-CHEK FASTCLIX LANCETS MISC CHECK BLOOD GLUCOSE FOUR TIMES DAILY 408 each 2  . ACCU-CHEK GUIDE test strip USE AS INSTRUCTED FOUR TIMES DAILY 150 each 5  . ACCU-CHEK SMARTVIEW test strip CHECK BLOOD GLUCOSE FOUR TIMES DAILY 400 each 2  . albuterol (PROVENTIL HFA;VENTOLIN HFA) 108 (90  Base) MCG/ACT inhaler Inhale 2 puffs into the lungs every 6 (six) hours as needed for wheezing or shortness of breath. 1 Inhaler 0  . Alcohol Swabs (B-D SINGLE USE SWABS REGULAR) PADS USE FOUR TIMES DAILY 400 each 2  . atenolol (TENORMIN) 50 MG tablet TAKE 1 TABLET EVERY DAY 90 tablet 0  . atorvastatin (LIPITOR) 40 MG tablet TAKE 1 TABLET EVERY DAY 90 tablet 3  . b complex vitamins tablet Take 1 tablet by mouth daily.    . Biotin 5000 MCG CAPS Take 1 capsule by mouth daily.    . Blood Glucose Monitoring Suppl (ACCU-CHEK GUIDE) w/Device KIT 1 each by Does not apply route 4 (four) times daily. 1 kit 0  . Calcium-Phosphorus-Vitamin D (CALCIUM GUMMIES PO) Take 500-1,000 mg by mouth daily.    . chlorpheniramine (EQ CHLORTABS) 4 MG tablet Take 4 mg by mouth 2 (two) times daily as needed for allergies.    . cholecalciferol (VITAMIN D) 1000 units tablet Take 1,000 Units by mouth daily.    Marland Kitchen diltiazem (CARDIZEM CD) 180 MG 24 hr capsule Take 1 capsule (180 mg total) by mouth daily. 90 capsule 3  . diphenhydrAMINE (BENADRYL) 25 MG tablet Take 25 mg by mouth daily.    Marland Kitchen ELIQUIS 5 MG TABS tablet TAKE 1 TABLET TWICE DAILY 180 tablet 3  . flecainide (TAMBOCOR) 100 MG tablet Take 1 tablet (100 mg total) by mouth 2 (two) times daily. 180 tablet 3  . glucose blood (ACCU-CHEK GUIDE) test strip Use as instructed 4 x daily 400 each 1  . insulin aspart (NOVOLOG) 100 UNIT/ML injection Inject 10-16 Units into the skin 3 (three) times daily with meals. 60 mL 2  . insulin detemir (LEVEMIR) 100 UNIT/ML injection Inject 0.5 mLs (50 Units total) into the skin at bedtime. 60 mL 1  . ipratropium-albuterol  (DUONEB) 0.5-2.5 (3) MG/3ML SOLN Take 3 mLs by nebulization every 6 (six) hours as needed (wheezing/ shortness of breath).    . liraglutide (VICTOZA) 18 MG/3ML SOPN Inject 0.3 mLs (1.8 mg total) into the skin daily. 27 mL 1  . lisinopril (PRINIVIL,ZESTRIL) 20 MG tablet TAKE 1 TABLET (20 MG TOTAL) BY MOUTH DAILY. (Patient taking differently: Take 10 mg by mouth daily. Patient is currently taking a 1/2 tablet due to low BP) 90 tablet 0  . Misc Natural Products (ESTROVEN + ENERGY MAX STRENGTH) TABS Take 1 tablet by mouth daily.    . Multiple Vitamin (MULTIVITAMIN) tablet Take 1 tablet by mouth daily.    . Omega-3 Fatty Acids (FISH OIL) 1200 MG CAPS Take 1 capsule by mouth daily.    Marland Kitchen omeprazole (PRILOSEC) 20 MG capsule TAKE 1 CAPSULE EVERY DAY 90 capsule 1  . sertraline (ZOLOFT) 100 MG tablet TAKE 1 TABLET (100 MG TOTAL) BY MOUTH DAILY. 90 tablet 0  . torsemide (DEMADEX) 20 MG tablet Take 20 mg by mouth daily.    . vitamin E (VITAMIN E) 400 UNIT capsule Take 400 Units by mouth daily.     No facility-administered medications prior to visit.      Positive Symptoms in bold:  Constitutional fevers, chills, weight loss, fatigue, anorexia, malaise  Eyes decreased vision, double vision, eye irritation  Ears, Nose, Mouth, Throat sore throat, trouble swallowing, sinus congestion  Cardiovascular chest pain, paroxysmal nocturnal dyspnea, lower ext edema, palpitations   Respiratory SOB, cough, DOE, hemoptysis, wheezing  Gastrointestinal nausea, vomiting, diarrhea  Genitourinary burning with urination, trouble urinating  Musculoskeletal joint aches, joint swelling, back pain  Integumentary  rashes, skin lesions  Neurological focal weakness, focal numbness, trouble speaking, headaches  Psychiatric depression, anxiety, confusion  Endocrine polyuria, polydipsia, cold intolerance, heat intolerance  Hematologic abnormal bruising, abnormal bleeding, unexplained nose bleeds  Allergic/Immunologic recurrent  infections, hives, swollen lymph nodes     Objective:    Vitals:   09/12/18 1112  BP: 112/60  Pulse: (!) 55  Temp: 98.2 F (36.8 C)  TempSrc: Oral  SpO2: 100%  Weight: 176 lb 4.8 oz (80 kg)  Height: _0  (1.6 m)   100% on 2 LPM  BMI Readings from Last 3 Encounters:  09/12/18 31.23 kg/m  07/26/18 30.11 kg/m  02/16/18 31.00 kg/m   Wt Readings from Last 3 Encounters:  09/12/18 176 lb 4.8 oz (80 kg)  07/26/18 170 lb (77.1 kg)  02/16/18 175 lb (79.4 kg)   GEN: frail elderly woman in NAD HEENT: MMM, no thrush CV: RRR, split P2, holosystolic murmur, ext warm PULM: occasional crackles at bases GI: Soft, +BS EXT: No edmea NEURO: Moves all 4 ext, globally weak PSYCH: AOx3, good insight SKIN: pale, multiple areas of eczema    CBC    Component Value Date/Time   WBC 6.4 02/16/2018 1508   RBC 3.43 (L) 02/16/2018 1508   HGB 10.0 (L) 02/16/2018 1508   HCT 32.4 (L) 02/16/2018 1508   PLT 194 02/16/2018 1508   MCV 94.5 02/16/2018 1508   MCH 29.2 02/16/2018 1508   MCHC 30.9 02/16/2018 1508   RDW 14.0 02/16/2018 1508   LYMPHSABS 1,505 06/16/2017 1001   MONOABS 0.7 06/03/2016 1630   EOSABS 160 06/16/2017 1001   BASOSABS 30 06/16/2017 1001     Chest Imaging: CT chest July 2020 IMPRESSION: 1. Multiple bilateral pulmonary nodules and clustered nodular opacities. The largest discrete pulmonary nodule measures 8 mm in the right pulmonary apex (series 4, image 26). There are additional irregular opacities and clustered nodules, for example in the superior segment left lower lobe (series 4, image 52) and in the lateral segment right middle lobe (series 4, image 87). Overall constellation of findings generally suggests atypical infection, including atypical mycobacterium, however specific follow-up at 3-6 months is warranted for pulmonary nodules by Fleischner Society criteria. 2.  Coronary artery disease and aortic atherosclerosis.  My read: CT scan of the chest  reviewed and shows mosaicism, mild right middle lobe and lingular bronchiectasis, severely enlarged left and right atrium, mildly enlarged RV.  Emphysematous changes are not very impressive.  Multiple scattered subcentimeter solid nodules without a specific distribution.  Pulmonary Functions Testing Results: No flowsheet data found.   Pathology:  07/30/18- bronchial wash read as no malignant cells, cannot find AFB smear  Echocardiogram:  IMPRESSIONS  1. The left ventricle has normal systolic function with an ejection fraction of 60-65%. The cavity size was normal. There is mildly increased left ventricular wall thickness. Left ventricular diastolic Doppler parameters are consistent with impaired  relaxation Elevated mean left atrial pressure.  2. The right ventricle has normal systolic function. The cavity was mildly enlarged. There is no increase in right ventricular wall thickness.  3. There is systolic flattening of the ventricular septum suggesting elevated RV pressure.  4. Left atrial size was severely dilated.  5. Right atrial size was severely dilated.  6. Diffuse calcification of MV leaflets. Severe thickening of the mitral valve leaflet. Mild mitral valve stenosis.  7. The tricuspid valve is normal in structure.  8. The aortic valve is tricuspid Mild thickening of the aortic valve no  stenosis of the aortic valve.  9. Pulmonary hypertension is moderately elevated, PASP 59. 10. Right atrial pressure is estimated at 3 mmHg      Assessment & Plan:  # Pulmonary hypertension-in the setting of impaired left ventricular relaxation and valvular heart disease.  Her CT does have some mild emphysema plus whatever this mosaicism is.  Regardless, this is most likely groups 2 and 3 pulmonary hypertension.  Treatment of this revolves around controlling the patient's ?COPD and volume status.  Because of the odd appearance of her CT chest and joint pains, will get some basic rheum labs to complete  workup.  # Abnormal CT chest-honestly looks more like pulmonary edema vs. bronchiolitis.  She has valvular and diastolic heart failure.  The timing of her worsening correlates with increased flecainide dosing, which is associated with pneumonitis.  If remainder of workup is negative, may need to reach out to her cardiologist to consider switching to something like sotalol or even considering ablation.  # Pulmonary nodules- Regarding the nodules, nonspecific likely inflammatory.  Does not look like a significant MAC infection to me and would expect more weight loss and constitutional symptoms if this is the case.   Would do usual nodule f/u for this smoking history.  Need to find AFB cultures from [place] to confirm no growth.  Unfortunately no Tbbx, cell differentials, or brushings performed.  Also never got PFTs.  She wants to avoid a repeat bronch if possible.  # Paleness- recheck CBC  # O2 dependence- continues to use and benefit from supplemental O2    - CT Chest Wo Contrast in 1 mo nodule (and ILD) f/u - Check labs as listed including rheum screen, BNP, CBC, I will call her regarding what to do with these - Pulmonary function test including TLC and DLCO at next visit - Continue supplemental O2 - Breo trial - f/u in 4-6 weeks    Current Outpatient Medications:  .  ACCU-CHEK FASTCLIX LANCETS MISC, CHECK BLOOD GLUCOSE FOUR TIMES DAILY, Disp: 408 each, Rfl: 2 .  ACCU-CHEK GUIDE test strip, USE AS INSTRUCTED FOUR TIMES DAILY, Disp: 150 each, Rfl: 5 .  ACCU-CHEK SMARTVIEW test strip, CHECK BLOOD GLUCOSE FOUR TIMES DAILY, Disp: 400 each, Rfl: 2 .  albuterol (PROVENTIL HFA;VENTOLIN HFA) 108 (90 Base) MCG/ACT inhaler, Inhale 2 puffs into the lungs every 6 (six) hours as needed for wheezing or shortness of breath., Disp: 1 Inhaler, Rfl: 0 .  Alcohol Swabs (B-D SINGLE USE SWABS REGULAR) PADS, USE FOUR TIMES DAILY, Disp: 400 each, Rfl: 2 .  atenolol (TENORMIN) 50 MG tablet, TAKE 1 TABLET EVERY  DAY, Disp: 90 tablet, Rfl: 0 .  atorvastatin (LIPITOR) 40 MG tablet, TAKE 1 TABLET EVERY DAY, Disp: 90 tablet, Rfl: 3 .  b complex vitamins tablet, Take 1 tablet by mouth daily., Disp: , Rfl:  .  Biotin 5000 MCG CAPS, Take 1 capsule by mouth daily., Disp: , Rfl:  .  Blood Glucose Monitoring Suppl (ACCU-CHEK GUIDE) w/Device KIT, 1 each by Does not apply route 4 (four) times daily., Disp: 1 kit, Rfl: 0 .  Calcium-Phosphorus-Vitamin D (CALCIUM GUMMIES PO), Take 500-1,000 mg by mouth daily., Disp: , Rfl:  .  chlorpheniramine (EQ CHLORTABS) 4 MG tablet, Take 4 mg by mouth 2 (two) times daily as needed for allergies., Disp: , Rfl:  .  cholecalciferol (VITAMIN D) 1000 units tablet, Take 1,000 Units by mouth daily., Disp: , Rfl:  .  diltiazem (CARDIZEM CD) 180 MG 24 hr capsule,  Take 1 capsule (180 mg total) by mouth daily., Disp: 90 capsule, Rfl: 3 .  diphenhydrAMINE (BENADRYL) 25 MG tablet, Take 25 mg by mouth daily., Disp: , Rfl:  .  ELIQUIS 5 MG TABS tablet, TAKE 1 TABLET TWICE DAILY, Disp: 180 tablet, Rfl: 3 .  flecainide (TAMBOCOR) 100 MG tablet, Take 1 tablet (100 mg total) by mouth 2 (two) times daily., Disp: 180 tablet, Rfl: 3 .  glucose blood (ACCU-CHEK GUIDE) test strip, Use as instructed 4 x daily, Disp: 400 each, Rfl: 1 .  insulin aspart (NOVOLOG) 100 UNIT/ML injection, Inject 10-16 Units into the skin 3 (three) times daily with meals., Disp: 60 mL, Rfl: 2 .  insulin detemir (LEVEMIR) 100 UNIT/ML injection, Inject 0.5 mLs (50 Units total) into the skin at bedtime., Disp: 60 mL, Rfl: 1 .  ipratropium-albuterol (DUONEB) 0.5-2.5 (3) MG/3ML SOLN, Take 3 mLs by nebulization every 6 (six) hours as needed (wheezing/ shortness of breath)., Disp: , Rfl:  .  liraglutide (VICTOZA) 18 MG/3ML SOPN, Inject 0.3 mLs (1.8 mg total) into the skin daily., Disp: 27 mL, Rfl: 1 .  lisinopril (PRINIVIL,ZESTRIL) 20 MG tablet, TAKE 1 TABLET (20 MG TOTAL) BY MOUTH DAILY. (Patient taking differently: Take 10 mg by mouth  daily. Patient is currently taking a 1/2 tablet due to low BP), Disp: 90 tablet, Rfl: 0 .  Misc Natural Products (ESTROVEN + ENERGY MAX STRENGTH) TABS, Take 1 tablet by mouth daily., Disp: , Rfl:  .  Multiple Vitamin (MULTIVITAMIN) tablet, Take 1 tablet by mouth daily., Disp: , Rfl:  .  Omega-3 Fatty Acids (FISH OIL) 1200 MG CAPS, Take 1 capsule by mouth daily., Disp: , Rfl:  .  omeprazole (PRILOSEC) 20 MG capsule, TAKE 1 CAPSULE EVERY DAY, Disp: 90 capsule, Rfl: 1 .  sertraline (ZOLOFT) 100 MG tablet, TAKE 1 TABLET (100 MG TOTAL) BY MOUTH DAILY., Disp: 90 tablet, Rfl: 0 .  torsemide (DEMADEX) 20 MG tablet, Take 20 mg by mouth daily., Disp: , Rfl:  .  vitamin E (VITAMIN E) 400 UNIT capsule, Take 400 Units by mouth daily., Disp: , Rfl:    [name], MD [facility] 09/12/2018 11:42 AM

## 2018-09-12 NOTE — Telephone Encounter (Signed)
Patient was seen today by Dr. Tamala Julian. He would like for her to have PFTs when she returns in 6 weeks.   Will forward to [facility] for follow up on the covid testing portion.

## 2018-09-12 NOTE — Patient Instructions (Addendum)
Thank you for visiting Dr. Tamala Julian at [facility].  - Get labs drawn - CT in 1 month - COVID swab and lung function tests  Today we recommend the following: Orders Placed This Encounter  Procedures  . CT Chest Wo Contrast  . Sed Rate (ESR)  . CKMB  . Rheumatoid Factor  . ANA,IFA RA Diag Pnl w/rflx Tit/Patn  . ANCA Screen Reflex Titer  . B Nat Peptide  . Basic Metabolic Panel (BMET)  . CBC with Differential/Platelet  . Pulmonary function test   No orders of the defined types were placed in this encounter.  Return in about 6 weeks (around 10/24/2018).    Please do your part to reduce the spread of COVID-19.

## 2018-09-15 LAB — ANA,IFA RA DIAG PNL W/RFLX TIT/PATN
Anti Nuclear Antibody (ANA): NEGATIVE
Cyclic Citrullin Peptide Ab: <16 UNITS
Rheumatoid fact SerPl-aCnc: <14 IU/mL (ref <14)

## 2018-09-15 LAB — ANCA SCREEN W REFLEX TITER: ANCA Screen: NEGATIVE

## 2018-09-19 DIAGNOSIS — J441 Chronic obstructive pulmonary disease with (acute) exacerbation: Secondary | ICD-10-CM | POA: Diagnosis not present

## 2018-09-20 ENCOUNTER — Other Ambulatory Visit: Payer: Self-pay

## 2018-09-21 DIAGNOSIS — N390 Urinary tract infection, site not specified: Secondary | ICD-10-CM | POA: Diagnosis not present

## 2018-10-01 NOTE — Telephone Encounter (Signed)
Patient scheduled 11/06/2018-pr

## 2018-10-01 NOTE — Telephone Encounter (Signed)
Noted. Patrice aware PFT needs scheduling.  

## 2018-10-04 DIAGNOSIS — J449 Chronic obstructive pulmonary disease, unspecified: Secondary | ICD-10-CM | POA: Diagnosis not present

## 2018-10-04 DIAGNOSIS — R0602 Shortness of breath: Secondary | ICD-10-CM | POA: Diagnosis not present

## 2018-10-19 DIAGNOSIS — J441 Chronic obstructive pulmonary disease with (acute) exacerbation: Secondary | ICD-10-CM | POA: Diagnosis not present

## 2018-10-29 DIAGNOSIS — K219 Gastro-esophageal reflux disease without esophagitis: Secondary | ICD-10-CM | POA: Diagnosis not present

## 2018-10-29 DIAGNOSIS — E1165 Type 2 diabetes mellitus with hyperglycemia: Secondary | ICD-10-CM | POA: Diagnosis not present

## 2018-10-29 DIAGNOSIS — E1129 Type 2 diabetes mellitus with other diabetic kidney complication: Secondary | ICD-10-CM | POA: Diagnosis not present

## 2018-10-29 DIAGNOSIS — R600 Localized edema: Secondary | ICD-10-CM | POA: Diagnosis not present

## 2018-10-29 DIAGNOSIS — I10 Essential (primary) hypertension: Secondary | ICD-10-CM | POA: Diagnosis not present

## 2018-10-29 DIAGNOSIS — I4891 Unspecified atrial fibrillation: Secondary | ICD-10-CM | POA: Diagnosis not present

## 2018-10-29 DIAGNOSIS — J45901 Unspecified asthma with (acute) exacerbation: Secondary | ICD-10-CM | POA: Diagnosis not present

## 2018-10-29 DIAGNOSIS — F331 Major depressive disorder, recurrent, moderate: Secondary | ICD-10-CM | POA: Diagnosis not present

## 2018-11-02 ENCOUNTER — Other Ambulatory Visit: Payer: Self-pay

## 2018-11-02 ENCOUNTER — Other Ambulatory Visit (HOSPITAL_COMMUNITY)
Admission: RE | Admit: 2018-11-02 | Discharge: 2018-11-02 | Disposition: A | Discharge status: Home / Self Care | Payer: Medicare HMO | Source: Ambulatory Visit | Attending: Internal Medicine | Admitting: Internal Medicine

## 2018-11-02 DIAGNOSIS — Z20828 Contact with and (suspected) exposure to other viral communicable diseases: Secondary | ICD-10-CM | POA: Diagnosis not present

## 2018-11-02 DIAGNOSIS — Z01812 Encounter for preprocedural laboratory examination: Secondary | ICD-10-CM | POA: Diagnosis not present

## 2018-11-02 LAB — SARS CORONAVIRUS 2 (TAT 6-24 HRS): SARS Coronavirus 2: NEGATIVE

## 2018-11-03 ENCOUNTER — Other Ambulatory Visit: Payer: Self-pay | Admitting: Cardiology

## 2018-11-03 DIAGNOSIS — J449 Chronic obstructive pulmonary disease, unspecified: Secondary | ICD-10-CM | POA: Diagnosis not present

## 2018-11-03 DIAGNOSIS — R0602 Shortness of breath: Secondary | ICD-10-CM | POA: Diagnosis not present

## 2018-11-05 ENCOUNTER — Other Ambulatory Visit: Payer: Self-pay | Admitting: "Endocrinology

## 2018-11-06 ENCOUNTER — Ambulatory Visit (INDEPENDENT_AMBULATORY_CARE_PROVIDER_SITE_OTHER): Payer: Medicare HMO | Admitting: Internal Medicine

## 2018-11-06 ENCOUNTER — Ambulatory Visit: Payer: Medicare HMO | Admitting: Internal Medicine

## 2018-11-06 ENCOUNTER — Other Ambulatory Visit: Payer: Self-pay

## 2018-11-06 ENCOUNTER — Encounter: Payer: Self-pay | Admitting: Internal Medicine

## 2018-11-06 VITALS — BP 130/54 | HR 62 | Temp 97.2°F | Ht 63.0 in | Wt 174.0 lb

## 2018-11-06 DIAGNOSIS — L989 Disorder of the skin and subcutaneous tissue, unspecified: Secondary | ICD-10-CM | POA: Diagnosis not present

## 2018-11-06 DIAGNOSIS — I272 Pulmonary hypertension, unspecified: Secondary | ICD-10-CM | POA: Diagnosis not present

## 2018-11-06 DIAGNOSIS — I4891 Unspecified atrial fibrillation: Secondary | ICD-10-CM

## 2018-11-06 DIAGNOSIS — R9389 Abnormal findings on diagnostic imaging of other specified body structures: Secondary | ICD-10-CM

## 2018-11-06 LAB — PULMONARY FUNCTION TEST
DL/VA % pred: 99 %
DL/VA: 4.11 ml/min/mmHg/L
DLCO cor % pred: 56 %
DLCO cor: 10.43 ml/min/mmHg
DLCO unc % pred: 51 %
DLCO unc: 9.57 ml/min/mmHg
FEF 25-75 Post: 0.99 L/sec
FEF 25-75 Pre: 0.65 L/sec
FEF2575-%Change-Post: 51 %
FEF2575-%Pred-Post: 61 %
FEF2575-%Pred-Pre: 40 %
FEV1-%Change-Post: 15 %
FEV1-%Pred-Post: 47 %
FEV1-%Pred-Pre: 41 %
FEV1-Post: 0.97 L
FEV1-Pre: 0.84 L
FEV1FVC-%Change-Post: 1 %
FEV1FVC-%Pred-Pre: 99 %
FEV6-%Change-Post: 17 %
FEV6-%Pred-Post: 49 %
FEV6-%Pred-Pre: 42 %
FEV6-Post: 1.27 L
FEV6-Pre: 1.09 L
FEV6FVC-%Pred-Post: 105 %
FEV6FVC-%Pred-Pre: 105 %
FVC-%Change-Post: 13 %
FVC-%Pred-Post: 47 %
FVC-%Pred-Pre: 41 %
FVC-Post: 1.27 L
FVC-Pre: 1.12 L
Post FEV1/FVC ratio: 76 %
Post FEV6/FVC ratio: 100 %
Pre FEV1/FVC ratio: 75 %
Pre FEV6/FVC Ratio: 100 %
RV % pred: 105 %
RV: 2.35 L
TLC % pred: 74 %
TLC: 3.66 L

## 2018-11-06 NOTE — Progress Notes (Signed)
Synopsis: Referred in Sept 2020 for Abnormal imaging, Pulm HTN by [name], MD  Subjective:   PATIENT ID: Ellen Jordan GENDER: female DOB: 05-25-1945, MRN: [id number]  No chief complaint on file.   History as below: Suspicion here is some combination of COPD and medication-associated ILD.  Rheum workup ordered last visit unremarkable (ANA, ANCA, ESR, RF).  BNP neg.  Gave trial of breo and ordered PFTs and repeat CT also.  PFTs with moderate restriction, preserved ratio.  DLCO markedly reduced but corrects for alveolar ventilation.  CT chest not yet done  Echo with mitral stenosis earlier this year not quantified, TEE 2017 with mild mitral disease, BNP neg last visit  A1c 6.4%, on a good deal of insulin  This is a 73 year old with a history of oxygen dependent ?COPD who is presenting for evaluation of pulmonary hypertension.  She was actually following with a Dr. Luan Pulling in [place].  A CT scan ordered by Dr. Luan Pulling revealed numerous subcentimeter pulmonary nodules with a question of MAC.  She underwent a bronchoscopy with BAL which did not show evidence of MAC.  At some point during her work-up she had an echocardiogram that showed pulmonary hypertension with an RVSP of 59.  She presents to pulmonary clinic for a second opinion.  Her dyspnea progressed over past year.  She was previously MMRC 0-1 on RA.  Now MMRC 1-2 on 2L O2.  Without O2 she quickly feels fatigued and oddly also gets back pains.  There is occasional wheezing but not consistently.  She has a runny nose but this has improved since she started O2.  Her cardiac history is a little unusual too.  She has refractory symptomatic paroxysmal afib s/p failed cardioversion now on flecainide that was titrated up around Sept 2019.  Comorbidities include paroxysmal atrial fibrillation on anticoagulation, diabetes.  Her home inhaler regimen seems to only be duo nebs PRN which work for a short while.  She has a father who had  emphysema.  Patient has a 50-pack-year smoking history quit 13 years ago.  She may have had some asbestos exposure in distant past but otherwise no concerning exposure history.  Used to work as Consulting civil engineer.  Has worsening fatigue and DOE since we last met.  Also worsening itchy plaques have erupted on body with one concerning nonhealing one on finger as well.  ROS + symptoms in bold Fatigue, malaise Fevers, chills, weight loss Nausea, vomiting, diarrhea Shortness of breath, wheezing, cough Chest pain, palpitations, lower ext edema Rash, concerning lesions   Past Medical History:  Diagnosis Date   Allergic rhinitis    Allergy    Anxiety    Atrial fibrillation (HCC)    Carpal tunnel syndrome    left   Cataract    Depression    Diabetic neuropathy ([place])    Essential hypertension    GERD (gastroesophageal reflux disease)    Mixed hyperlipidemia    PAF (paroxysmal atrial fibrillation) ([place])    a. s/p TEE cardioversion March 2017. b. Recurrence by event monitor 04/2016.   RBBB    Restless leg syndrome    Type 2 diabetes mellitus (HCC)    Uterine cancer (HCC)      Family History  Problem Relation Age of Onset   Heart disease Mother 69   Hyperlipidemia Mother    Diabetes Brother    COPD Father 42       emphysema   Hypertension Father    Hyperlipidemia Father  Diabetes Daughter    AAA (abdominal aortic aneurysm) Son    Diabetes Brother    Diabetes Brother    Diabetes Brother      Past Surgical History:  Procedure Laterality Date   ABDOMINAL HYSTERECTOMY     uterine cancer   BACK SURGERY     CARDIOVERSION N/A 03/11/2015   Procedure: CARDIOVERSION;  Surgeon: [name], MD;  Location: [facility];  Service: Cardiovascular;  Laterality: N/A;   CHOLECYSTECTOMY     EYE SURGERY     cataracts   FLEXIBLE BRONCHOSCOPY N/A 07/30/2018   Procedure: FLEXIBLE BRONCHOSCOPY;  Surgeon: Sinda Du, MD;  Location[facility];   Service: Cardiopulmonary;  Laterality: N/A;   SPINE SURGERY     TEE WITHOUT CARDIOVERSION N/A 03/11/2015   Procedure: TRANSESOPHAGEAL ECHOCARDIOGRAM (TEE);  Surgeon: [name], MD;  Location: [facility];  Service: Cardiovascular;  Laterality: N/A;    Social History   Socioeconomic History   Marital status: Married    Spouse name: earl   Number of children: 2   Years of education: 13   Highest education level: Not on file  Occupational History   Occupation: retired  Scientist, product/process development strain: Not on file   Food insecurity    Worry: Not on file    Inability: Not on Lexicographer needs    Medical: Not on file    Non-medical: Not on file  Tobacco Use   Smoking status: Former Smoker    Packs/day: 1.00    Years: 50.00    Pack years: 50.00    Types: Cigarettes    Start date: 01/10/1961    Quit date: 01/10/2005    Years since quitting: 13.8   Smokeless tobacco: Former Systems developer    Quit date: 01/10/2005  Substance and Sexual Activity   Alcohol use: No    Alcohol/week: 0.0 standard drinks   Drug use: No   Sexual activity: Not Currently    Partners: Male  Lifestyle   Physical activity    Days per week: Not on file    Minutes per session: Not on file   Stress: Not on file  Relationships   Social connections    Talks on phone: Not on file    Gets together: Not on file    Attends religious service: Not on file    Active member of club or organization: Not on file    Attends meetings of clubs or organizations: Not on file    Relationship status: Not on file   Intimate partner violence    Fear of current or ex partner: Not on file    Emotionally abused: Not on file    Physically abused: Not on file    Forced sexual activity: Not on file  Other Topics Concern   Not on file  Social History Narrative   Moved to this area in 40 from [place].    Married for over 50 years. Has two children. Lives with husband [name] (earl)   Likes  to read   Retired from [employer].   Enjoys time with family, beach, read, exericse.   Walk dog.   Eats all food groups.   Wear seatbelt.   Wear sunscreen.     Allergies  Allergen Reactions   Penicillins Hives    Has patient had a PCN reaction causing immediate rash, facial/tongue/throat swelling, SOB or lightheadedness with hypotension: Yes Has patient had a PCN reaction causing severe rash involving mucus  membranes or skin necrosis: No Has patient had a PCN reaction that required hospitalization No Has patient had a PCN reaction occurring within the last 10 years: No If all of the above answers are "NO", then may proceed with Cephalosporin use.      Outpatient Medications Prior to Visit  Medication Sig Dispense Refill   ACCU-CHEK FASTCLIX LANCETS MISC CHECK BLOOD GLUCOSE FOUR TIMES DAILY 408 each 2   ACCU-CHEK GUIDE test strip USE AS INSTRUCTED FOUR TIMES DAILY 350 strip 1   ACCU-CHEK SMARTVIEW test strip CHECK BLOOD GLUCOSE FOUR TIMES DAILY 400 each 2   albuterol (PROVENTIL HFA;VENTOLIN HFA) 108 (90 Base) MCG/ACT inhaler Inhale 2 puffs into the lungs every 6 (six) hours as needed for wheezing or shortness of breath. 1 Inhaler 0   Alcohol Swabs (B-D SINGLE USE SWABS REGULAR) PADS USE FOUR TIMES DAILY 400 each 2   atenolol (TENORMIN) 50 MG tablet TAKE 1 TABLET EVERY DAY 90 tablet 0   atorvastatin (LIPITOR) 40 MG tablet TAKE 1 TABLET EVERY DAY 90 tablet 3   b complex vitamins tablet Take 1 tablet by mouth daily.     Biotin 5000 MCG CAPS Take 1 capsule by mouth daily.     Blood Glucose Monitoring Suppl (ACCU-CHEK GUIDE) w/Device KIT 1 each by Does not apply route 4 (four) times daily. 1 kit 0   Calcium-Phosphorus-Vitamin D (CALCIUM GUMMIES PO) Take 500-1,000 mg by mouth daily.     chlorpheniramine (EQ CHLORTABS) 4 MG tablet Take 4 mg by mouth 2 (two) times daily as needed for allergies.     cholecalciferol (VITAMIN D) 1000 units tablet Take 1,000 Units by mouth  daily.     diltiazem (CARDIZEM CD) 180 MG 24 hr capsule Take 1 capsule (180 mg total) by mouth daily. 90 capsule 3   diphenhydrAMINE (BENADRYL) 25 MG tablet Take 25 mg by mouth daily.     ELIQUIS 5 MG TABS tablet TAKE 1 TABLET TWICE DAILY 180 tablet 3   flecainide (TAMBOCOR) 100 MG tablet TAKE 1 TABLET TWICE DAILY ( DOSE INCREASE ) 180 tablet 3   fluticasone furoate-vilanterol (BREO ELLIPTA) 100-25 MCG/INH AEPB Inhale 1 puff into the lungs daily. 2 each 0   glucose blood (ACCU-CHEK GUIDE) test strip Use as instructed 4 x daily 400 each 1   insulin aspart (NOVOLOG) 100 UNIT/ML injection Inject 10-16 Units into the skin 3 (three) times daily with meals. 60 mL 2   insulin detemir (LEVEMIR) 100 UNIT/ML injection Inject 0.5 mLs (50 Units total) into the skin at bedtime. 60 mL 1   ipratropium-albuterol (DUONEB) 0.5-2.5 (3) MG/3ML SOLN Take 3 mLs by nebulization every 6 (six) hours as needed (wheezing/ shortness of breath).     liraglutide (VICTOZA) 18 MG/3ML SOPN Inject 0.3 mLs (1.8 mg total) into the skin daily. 27 mL 1   lisinopril (PRINIVIL,ZESTRIL) 20 MG tablet TAKE 1 TABLET (20 MG TOTAL) BY MOUTH DAILY. (Patient taking differently: Take 10 mg by mouth daily. Patient is currently taking a 1/2 tablet due to low BP) 90 tablet 0   Misc Natural Products (ESTROVEN + ENERGY MAX STRENGTH) TABS Take 1 tablet by mouth daily.     Multiple Vitamin (MULTIVITAMIN) tablet Take 1 tablet by mouth daily.     Omega-3 Fatty Acids (FISH OIL) 1200 MG CAPS Take 1 capsule by mouth daily.     omeprazole (PRILOSEC) 20 MG capsule TAKE 1 CAPSULE EVERY DAY 90 capsule 1   sertraline (ZOLOFT) 100 MG tablet TAKE 1 TABLET (  100 MG TOTAL) BY MOUTH DAILY. 90 tablet 0   torsemide (DEMADEX) 20 MG tablet Take 20 mg by mouth daily.     vitamin E (VITAMIN E) 400 UNIT capsule Take 400 Units by mouth daily.     No facility-administered medications prior to visit.      Objective:    Vitals:   11/06/18 1456  BP:  (!) 130/54  Pulse: 62  Temp: (!) 97.2 F (36.2 C)  TempSrc: Oral  SpO2: 92%  Weight: 174 lb (78.9 kg)  Height: _0  (1.6 m)   92% on 2 LPM  BMI Readings from Last 3 Encounters:  11/06/18 30.82 kg/m  09/12/18 31.23 kg/m  07/26/18 30.11 kg/m   Wt Readings from Last 3 Encounters:  11/06/18 174 lb (78.9 kg)  09/12/18 176 lb 4.8 oz (80 kg)  07/26/18 170 lb (77.1 kg)   GEN: frail elderly woman in NAD HEENT: MMM, no thrush CV: RRR, split P2, holosystolic murmur, ext warm PULM: occasional crackles at bases GI: Soft, +BS EXT: No edmea NEURO: Moves all 4 ext, globally weak PSYCH: AOx3, good insight SKIN: pale, multiple areas of ?exzema vs. Plaques, L finger with nonhealing wound    CBC    Component Value Date/Time   WBC 6.3 09/12/2018 1201   RBC 3.68 (L) 09/12/2018 1201   HGB 11.0 (L) 09/12/2018 1201   HCT 32.9 (L) 09/12/2018 1201   PLT 141.0 (L) 09/12/2018 1201   MCV 89.3 09/12/2018 1201   MCH 29.2 02/16/2018 1508   MCHC 33.5 09/12/2018 1201   RDW 15.4 09/12/2018 1201   LYMPHSABS 1.0 09/12/2018 1201   MONOABS 0.5 09/12/2018 1201   EOSABS 0.1 09/12/2018 1201   BASOSABS 0.0 09/12/2018 1201     Chest Imaging: CT chest July 2020 IMPRESSION: 1. Multiple bilateral pulmonary nodules and clustered nodular opacities. The largest discrete pulmonary nodule measures 8 mm in the right pulmonary apex (series 4, image 26). There are additional irregular opacities and clustered nodules, for example in the superior segment left lower lobe (series 4, image 52) and in the lateral segment right middle lobe (series 4, image 87). Overall constellation of findings generally suggests atypical infection, including atypical mycobacterium, however specific follow-up at 3-6 months is warranted for pulmonary nodules by Fleischner Society criteria. 2.  Coronary artery disease and aortic atherosclerosis.  My read: CT scan of the chest reviewed and shows mosaicism, mild right middle lobe  and lingular bronchiectasis, severely enlarged left and right atrium, mildly enlarged RV.  Emphysematous changes are not very impressive.  Multiple scattered subcentimeter solid nodules without a specific distribution.  Pulmonary Functions Testing Results: PFT Results Latest Ref Rng & Units 11/06/2018  FVC-Pre L 1.12  FVC-Predicted Pre % 41  FVC-Post L 1.27  FVC-Predicted Post % 47  Pre FEV1/FVC % % 75  Post FEV1/FCV % % 76  FEV1-Pre L 0.84  FEV1-Predicted Pre % 41  FEV1-Post L 0.97  DLCO UNC% % 51  DLCO COR %Predicted % 99  TLC L 3.66  TLC % Predicted % 74  RV % Predicted % 105     Pathology:  07/30/18- bronchial wash read as no malignant cells, cannot find AFB smear  Echocardiogram:  IMPRESSIONS  1. The left ventricle has normal systolic function with an ejection fraction of 60-65%. The cavity size was normal. There is mildly increased left ventricular wall thickness. Left ventricular diastolic Doppler parameters are consistent with impaired  relaxation Elevated mean left atrial pressure.  2. The right ventricle  has normal systolic function. The cavity was mildly enlarged. There is no increase in right ventricular wall thickness.  3. There is systolic flattening of the ventricular septum suggesting elevated RV pressure.  4. Left atrial size was severely dilated.  5. Right atrial size was severely dilated.  6. Diffuse calcification of MV leaflets. Severe thickening of the mitral valve leaflet. Mild mitral valve stenosis.  7. The tricuspid valve is normal in structure.  8. The aortic valve is tricuspid Mild thickening of the aortic valve no stenosis of the aortic valve.  9. Pulmonary hypertension is moderately elevated, PASP 59. 10. Right atrial pressure is estimated at 3 mmHg      Assessment & Plan:  # Pulmonary hypertension-in the setting of impaired left ventricular relaxation and valvular heart disease.  Her CT does have some mild emphysema plus whatever this mosaicism is.   Regardless, this is most likely groups 2 and 3 pulmonary hypertension.  Treatment of this revolves around controlling the patient's ?COPD and volume status.  Because of the odd appearance of her CT chest and joint pains, will get some basic rheum labs to complete workup.  # ILD- BNP neg arguing against this being fluid, has impressive mosacism, PFTs with mostly restriction, DLCO corrects for this.  Bronch without any growth although TBBx not pursued.  Worsening diffuse numular lesions.  Rheum workup neg.  Only thing I am left with is that this is related to slowly increasing flecainide dosing causing an allergic type reaction.  # Pulmonary nodules- Regarding the nodules, nonspecific likely inflammatory.  Does not look like a significant MAC infection  and would expect something to have grown in BAL although TBBx would be more definitive.  # O2 dependence- continues to use and benefit from supplemental O2   EKG in office RBBB, sinus bradycardia  Plan today: -Spoke with her cardiologist- okay for trial off flecainide.  She doesn't really have symptoms when in Afib but told to monitor for them if she develops any. - Breo of no help, fine to come off - Derm referral for biopsy of finger lesion and leg lesions - Not really ready to give her a good steroid trial due to her fairly advanced diabetes plus she had significant anxiety with prior trials of this.  May be left with no choice if she continues to decline (prior to starting these steroids would want to get Tbbx to see if we can find some sort of diagnosis here). - CT chest - f/u in 2 weeks with midlevel to check symptoms, rash, and heart rhythm     Current Outpatient Medications:    ACCU-CHEK FASTCLIX LANCETS MISC, CHECK BLOOD GLUCOSE FOUR TIMES DAILY, Disp: 408 each, Rfl: 2   ACCU-CHEK GUIDE test strip, USE AS INSTRUCTED FOUR TIMES DAILY, Disp: 350 strip, Rfl: 1   ACCU-CHEK SMARTVIEW test strip, CHECK BLOOD GLUCOSE FOUR TIMES DAILY,  Disp: 400 each, Rfl: 2   albuterol (PROVENTIL HFA;VENTOLIN HFA) 108 (90 Base) MCG/ACT inhaler, Inhale 2 puffs into the lungs every 6 (six) hours as needed for wheezing or shortness of breath., Disp: 1 Inhaler, Rfl: 0   Alcohol Swabs (B-D SINGLE USE SWABS REGULAR) PADS, USE FOUR TIMES DAILY, Disp: 400 each, Rfl: 2   atenolol (TENORMIN) 50 MG tablet, TAKE 1 TABLET EVERY DAY, Disp: 90 tablet, Rfl: 0   atorvastatin (LIPITOR) 40 MG tablet, TAKE 1 TABLET EVERY DAY, Disp: 90 tablet, Rfl: 3   b complex vitamins tablet, Take 1 tablet by mouth daily., Disp: ,  Rfl:    Biotin 5000 MCG CAPS, Take 1 capsule by mouth daily., Disp: , Rfl:    Blood Glucose Monitoring Suppl (ACCU-CHEK GUIDE) w/Device KIT, 1 each by Does not apply route 4 (four) times daily., Disp: 1 kit, Rfl: 0   Calcium-Phosphorus-Vitamin D (CALCIUM GUMMIES PO), Take 500-1,000 mg by mouth daily., Disp: , Rfl:    chlorpheniramine (EQ CHLORTABS) 4 MG tablet, Take 4 mg by mouth 2 (two) times daily as needed for allergies., Disp: , Rfl:    cholecalciferol (VITAMIN D) 1000 units tablet, Take 1,000 Units by mouth daily., Disp: , Rfl:    diltiazem (CARDIZEM CD) 180 MG 24 hr capsule, Take 1 capsule (180 mg total) by mouth daily., Disp: 90 capsule, Rfl: 3   diphenhydrAMINE (BENADRYL) 25 MG tablet, Take 25 mg by mouth daily., Disp: , Rfl:    ELIQUIS 5 MG TABS tablet, TAKE 1 TABLET TWICE DAILY, Disp: 180 tablet, Rfl: 3   flecainide (TAMBOCOR) 100 MG tablet, TAKE 1 TABLET TWICE DAILY ( DOSE INCREASE ), Disp: 180 tablet, Rfl: 3   fluticasone furoate-vilanterol (BREO ELLIPTA) 100-25 MCG/INH AEPB, Inhale 1 puff into the lungs daily., Disp: 2 each, Rfl: 0   glucose blood (ACCU-CHEK GUIDE) test strip, Use as instructed 4 x daily, Disp: 400 each, Rfl: 1   insulin aspart (NOVOLOG) 100 UNIT/ML injection, Inject 10-16 Units into the skin 3 (three) times daily with meals., Disp: 60 mL, Rfl: 2   insulin detemir (LEVEMIR) 100 UNIT/ML injection, Inject  0.5 mLs (50 Units total) into the skin at bedtime., Disp: 60 mL, Rfl: 1   ipratropium-albuterol (DUONEB) 0.5-2.5 (3) MG/3ML SOLN, Take 3 mLs by nebulization every 6 (six) hours as needed (wheezing/ shortness of breath)., Disp: , Rfl:    liraglutide (VICTOZA) 18 MG/3ML SOPN, Inject 0.3 mLs (1.8 mg total) into the skin daily., Disp: 27 mL, Rfl: 1   lisinopril (PRINIVIL,ZESTRIL) 20 MG tablet, TAKE 1 TABLET (20 MG TOTAL) BY MOUTH DAILY. (Patient taking differently: Take 10 mg by mouth daily. Patient is currently taking a 1/2 tablet due to low BP), Disp: 90 tablet, Rfl: 0   Misc Natural Products (ESTROVEN + ENERGY MAX STRENGTH) TABS, Take 1 tablet by mouth daily., Disp: , Rfl:    Multiple Vitamin (MULTIVITAMIN) tablet, Take 1 tablet by mouth daily., Disp: , Rfl:    Omega-3 Fatty Acids (FISH OIL) 1200 MG CAPS, Take 1 capsule by mouth daily., Disp: , Rfl:    omeprazole (PRILOSEC) 20 MG capsule, TAKE 1 CAPSULE EVERY DAY, Disp: 90 capsule, Rfl: 1   sertraline (ZOLOFT) 100 MG tablet, TAKE 1 TABLET (100 MG TOTAL) BY MOUTH DAILY., Disp: 90 tablet, Rfl: 0   torsemide (DEMADEX) 20 MG tablet, Take 20 mg by mouth daily., Disp: , Rfl:    vitamin E (VITAMIN E) 400 UNIT capsule, Take 400 Units by mouth daily., Disp: , Rfl:    [name], MD [facility] 11/06/2018 4:18 PM

## 2018-11-06 NOTE — Progress Notes (Signed)
Full PFT performed today.

## 2018-11-06 NOTE — Patient Instructions (Addendum)
-  Repeat CT chest - Stop flecainide - Dermatology referral - Repeat echocardiogram - 2 week midlevel visit

## 2018-11-09 DIAGNOSIS — C44629 Squamous cell carcinoma of skin of left upper limb, including shoulder: Secondary | ICD-10-CM | POA: Diagnosis not present

## 2018-11-09 DIAGNOSIS — C44729 Squamous cell carcinoma of skin of left lower limb, including hip: Secondary | ICD-10-CM | POA: Diagnosis not present

## 2018-11-09 DIAGNOSIS — Z85828 Personal history of other malignant neoplasm of skin: Secondary | ICD-10-CM | POA: Diagnosis not present

## 2018-11-09 DIAGNOSIS — L578 Other skin changes due to chronic exposure to nonionizing radiation: Secondary | ICD-10-CM | POA: Diagnosis not present

## 2018-11-09 DIAGNOSIS — L2089 Other atopic dermatitis: Secondary | ICD-10-CM | POA: Diagnosis not present

## 2018-11-13 DIAGNOSIS — R600 Localized edema: Secondary | ICD-10-CM | POA: Diagnosis not present

## 2018-11-13 DIAGNOSIS — E1165 Type 2 diabetes mellitus with hyperglycemia: Secondary | ICD-10-CM | POA: Diagnosis not present

## 2018-11-13 DIAGNOSIS — I10 Essential (primary) hypertension: Secondary | ICD-10-CM | POA: Diagnosis not present

## 2018-11-13 DIAGNOSIS — J45901 Unspecified asthma with (acute) exacerbation: Secondary | ICD-10-CM | POA: Diagnosis not present

## 2018-11-13 DIAGNOSIS — F331 Major depressive disorder, recurrent, moderate: Secondary | ICD-10-CM | POA: Diagnosis not present

## 2018-11-13 DIAGNOSIS — I4891 Unspecified atrial fibrillation: Secondary | ICD-10-CM | POA: Diagnosis not present

## 2018-11-13 DIAGNOSIS — E1129 Type 2 diabetes mellitus with other diabetic kidney complication: Secondary | ICD-10-CM | POA: Diagnosis not present

## 2018-11-13 DIAGNOSIS — K219 Gastro-esophageal reflux disease without esophagitis: Secondary | ICD-10-CM | POA: Diagnosis not present

## 2018-11-19 DIAGNOSIS — J441 Chronic obstructive pulmonary disease with (acute) exacerbation: Secondary | ICD-10-CM | POA: Diagnosis not present

## 2018-11-26 DIAGNOSIS — C44722 Squamous cell carcinoma of skin of right lower limb, including hip: Secondary | ICD-10-CM | POA: Diagnosis not present

## 2018-11-26 DIAGNOSIS — Z85828 Personal history of other malignant neoplasm of skin: Secondary | ICD-10-CM | POA: Diagnosis not present

## 2018-11-26 DIAGNOSIS — C44629 Squamous cell carcinoma of skin of left upper limb, including shoulder: Secondary | ICD-10-CM | POA: Diagnosis not present

## 2018-12-04 ENCOUNTER — Other Ambulatory Visit: Payer: Self-pay

## 2018-12-04 ENCOUNTER — Ambulatory Visit (HOSPITAL_COMMUNITY)
Admission: RE | Admit: 2018-12-04 | Discharge: 2018-12-04 | Disposition: A | Discharge status: Home / Self Care | Payer: Medicare HMO | Source: Ambulatory Visit | Attending: Internal Medicine | Admitting: Internal Medicine

## 2018-12-04 ENCOUNTER — Other Ambulatory Visit: Payer: Self-pay | Admitting: "Endocrinology

## 2018-12-04 DIAGNOSIS — R9389 Abnormal findings on diagnostic imaging of other specified body structures: Secondary | ICD-10-CM | POA: Diagnosis not present

## 2018-12-04 DIAGNOSIS — R0602 Shortness of breath: Secondary | ICD-10-CM | POA: Diagnosis not present

## 2018-12-04 DIAGNOSIS — R918 Other nonspecific abnormal finding of lung field: Secondary | ICD-10-CM | POA: Diagnosis not present

## 2018-12-04 DIAGNOSIS — I272 Pulmonary hypertension, unspecified: Secondary | ICD-10-CM

## 2018-12-04 DIAGNOSIS — J449 Chronic obstructive pulmonary disease, unspecified: Secondary | ICD-10-CM | POA: Diagnosis not present

## 2018-12-05 ENCOUNTER — Telehealth: Payer: Self-pay | Admitting: Internal Medicine

## 2018-12-05 NOTE — Telephone Encounter (Signed)
Reviewed patient's CT results in Epic. Dr. Tamala Julian is on vacation. Below is the CT scan report. Spoke with Aaron Edelman about the results and will route message to him as requested.   IMPRESSION: 1. Bulky mediastinal adenopathy as described. Lymphoproliferative disorder or metastatic process and lymphangitic carcinomatosis is not excluded. Sarcoidosis is considered in the differential based on the presence of adenopathy, potential interstitial lung disease with signs of nodular septal thickening and fissural thickening. Nodal biopsy may be warranted. 2. New areas of its tree-in-bud opacity and ill-defined nodularity in the inferior right upper lobe and in the right middle lobe. Findings raising the question atypical infection. 3. Multiple pulmonary nodules are not changed. Continued follow-up based on the largest nodule in the right upper lobe is suggested at 18 months following the scan from July of 2020; however, follow-up may occur sooner based on other findings described above it has. 4. Signs of cirrhosis. 5. Aortic atherosclerosis. Coronary artery calcifications. An element of the septal thickening could be due to pulmonary edema as well, though areas appear more irregular and slightly nodular as described. 6. Small pericardial effusion is unchanged, now associated with trace bilateral pleural effusions. 7. These results will be called to the ordering clinician or representative by the Radiologist Assistant, and communication documented in the PACS or zVision Dashboard.  Aaron Edelman, please advise. Thanks!

## 2018-12-05 NOTE — Telephone Encounter (Signed)
12/05/2018 0935  CT scan reviewed from 12/04/2018.  Showing bulky mediastinal adenopathy.  This could be considered sarcoidosis.  This can be further discussed at her next office visit with Dr. Tamala Julian in December/2020.    Patient recently underwent Bronchoscopy on 07/30/2018 with BAL showing no malignant cells.  I personally do not see a cell count or sputum, fungal, AFB from this bronchoscopy.  May need to consider repeat bronchoscopy/EBUS.  Likely also need to do sputum culture with AFB and fungal.  To further evaluate new nodularity.  No further changes at this plan at time.  Keep upcoming appointments that the CT can be reviewed in more detail.  Please also make sure the patient has been established with dermatology.  Wyn Quaker, FNP

## 2018-12-10 NOTE — Telephone Encounter (Signed)
LMTCB to inquire about dematology appt. Patient has appt 12/3. If we do not here back we can address at that time.

## 2018-12-11 NOTE — Telephone Encounter (Signed)
LMTCB

## 2018-12-13 ENCOUNTER — Other Ambulatory Visit: Payer: Self-pay

## 2018-12-13 ENCOUNTER — Encounter: Payer: Self-pay | Admitting: Internal Medicine

## 2018-12-13 ENCOUNTER — Ambulatory Visit: Payer: Medicare HMO | Admitting: Internal Medicine

## 2018-12-13 VITALS — BP 124/78 | HR 85 | Temp 97.4°F | Ht 63.0 in | Wt 176.4 lb

## 2018-12-13 DIAGNOSIS — F331 Major depressive disorder, recurrent, moderate: Secondary | ICD-10-CM | POA: Diagnosis not present

## 2018-12-13 DIAGNOSIS — K219 Gastro-esophageal reflux disease without esophagitis: Secondary | ICD-10-CM | POA: Diagnosis not present

## 2018-12-13 DIAGNOSIS — R591 Generalized enlarged lymph nodes: Secondary | ICD-10-CM

## 2018-12-13 DIAGNOSIS — E1165 Type 2 diabetes mellitus with hyperglycemia: Secondary | ICD-10-CM | POA: Diagnosis not present

## 2018-12-13 DIAGNOSIS — I4891 Unspecified atrial fibrillation: Secondary | ICD-10-CM | POA: Diagnosis not present

## 2018-12-13 DIAGNOSIS — R599 Enlarged lymph nodes, unspecified: Secondary | ICD-10-CM

## 2018-12-13 DIAGNOSIS — I482 Chronic atrial fibrillation, unspecified: Secondary | ICD-10-CM | POA: Diagnosis not present

## 2018-12-13 DIAGNOSIS — I10 Essential (primary) hypertension: Secondary | ICD-10-CM | POA: Diagnosis not present

## 2018-12-13 DIAGNOSIS — J45901 Unspecified asthma with (acute) exacerbation: Secondary | ICD-10-CM | POA: Diagnosis not present

## 2018-12-13 DIAGNOSIS — R600 Localized edema: Secondary | ICD-10-CM | POA: Diagnosis not present

## 2018-12-13 DIAGNOSIS — E1129 Type 2 diabetes mellitus with other diabetic kidney complication: Secondary | ICD-10-CM | POA: Diagnosis not present

## 2018-12-13 NOTE — Telephone Encounter (Signed)
Pt has appt with Dr. Tamala Julian 12/13/18 at 2pm. Will close encounter.

## 2018-12-13 NOTE — Patient Instructions (Addendum)
Interstitial lung disease.  These are associated with certain medications.  They can also be associated with other autoimmune diseases, your tests for this are negative.  Dr. Particia Nearing, dermatology- we will get records of your biopsy and office visit.  Bronchoscopy: scheduled Tuesday 12/18/2018 at [facility]. Please arrive by 11:15. Nothing to eat after midnight. Take medications with a sip of water. No dark fluids.   Your covid test has been scheduled for 12/14/2018 at 1245pm at [address]   Take your last dose of your eliquis Sat evening. DO NOT RESUME UNTIL INSTRUCTED TO DO SO BY DR. Tamala Julian.   We will call you regarding insulin instructions.

## 2018-12-13 NOTE — H&P (View-Only) (Signed)
 Synopsis: Referred in Sept 2020 for Abnormal imaging, Pulm HTN by Hall, John Z, MD  Subjective:   PATIENT ID: Ellen Jordan GENDER: female DOB: 06/30/1945, MRN: [id number]  Chief Complaint  Patient presents with  . Follow-up    CT 11.25. Here to discuss results.     History as below:   This is a 72-year-old with a history of oxygen dependent ?COPD who is presenting for evaluation of pulmonary hypertension.  She was actually following with a Dr. Hawkins in [place].  A CT scan ordered by Dr. Hawkins revealed numerous subcentimeter pulmonary nodules with a question of MAC.  She underwent a bronchoscopy with BAL which did not show evidence of MAC.  At some point during her work-up she had an echocardiogram that showed pulmonary hypertension with an RVSP of 59.  She presents to pulmonary clinic for a second opinion.  Her dyspnea progressed over past year.  She was previously MMRC 0-1 on RA.  Now MMRC 1-2 on 2L O2.  Without O2 she quickly feels fatigued and oddly also gets back pains.  There is occasional wheezing but not consistently.  She has a runny nose but this has improved since she started O2.  Her cardiac history is a little unusual too.  She has refractory symptomatic paroxysmal afib s/p failed cardioversion now on flecainide that was titrated up around Sept 2019.  Comorbidities include paroxysmal atrial fibrillation on anticoagulation, diabetes.  Her home inhaler regimen seems to only be duo nebs PRN which work for a short while.  She has a father who had emphysema.  Patient has a 50-pack-year smoking history quit 13 years ago.  She may have had some asbestos exposure in distant past but otherwise no concerning exposure history.  Used to work as teaching assistant.  Suspicion here is some combination of COPD and medication-associated ILD.  Rheum workup neg. PFTs with moderate restriction, preserved ratio.  DLCO markedly reduced but corrects for alveolar ventilation.  Echo with  mitral stenosis earlier this year not quantified, TEE 2017 with mild mitral disease, BNP neg last visit  A1c 6.4%, on a good deal of insulin  Last visit, referred to dermatology, took her off flecainide, awaited repeat CT ordered above.        Derm did biopsy ? Melanoma, records to be faxed in  CT showing worsening septal thickening and adenopathy. Breathing also getting worse despite stopping flecainide. Patient had wanted to avoid bronch but I think we are out of options. MMRC 2 dyspnea.  ROS + symptoms in bold Fatigue, malaise Fevers, chills, weight loss Nausea, vomiting, diarrhea Shortness of breath, wheezing, cough Chest pain, palpitations, lower ext edema Rash, concerning lesions   Past Medical History:  Diagnosis Date  . Allergic rhinitis   . Allergy   . Anxiety   . Atrial fibrillation (HCC)   . Carpal tunnel syndrome    left  . Cataract   . Depression   . Diabetic neuropathy (HCC)   . Essential hypertension   . GERD (gastroesophageal reflux disease)   . Mixed hyperlipidemia   . PAF (paroxysmal atrial fibrillation) (HCC)    a. s/p TEE cardioversion March 2017. b. Recurrence by event monitor 04/2016.  . RBBB   . Restless leg syndrome   . Type 2 diabetes mellitus (HCC)   . Uterine cancer (HCC)      Family History  Problem Relation Age of Onset  . Heart disease Mother 68  . Hyperlipidemia Mother   . Diabetes Brother   .   COPD Father 67       emphysema  . Hypertension Father   . Hyperlipidemia Father   . Diabetes Daughter   . AAA (abdominal aortic aneurysm) Son   . Diabetes Brother   . Diabetes Brother   . Diabetes Brother      Past Surgical History:  Procedure Laterality Date  . ABDOMINAL HYSTERECTOMY     uterine cancer  . BACK SURGERY    . CARDIOVERSION N/A 03/11/2015   Procedure: CARDIOVERSION;  Surgeon: Katarina H Nelson, MD;  Location: [facility];  Service: Cardiovascular;  Laterality: N/A;  . CHOLECYSTECTOMY    . EYE SURGERY     cataracts   . FLEXIBLE BRONCHOSCOPY N/A 07/30/2018   Procedure: FLEXIBLE BRONCHOSCOPY;  Surgeon: Hawkins, Edward, MD;  Location: [facility];  Service: Cardiopulmonary;  Laterality: N/A;  . SPINE SURGERY    . TEE WITHOUT CARDIOVERSION N/A 03/11/2015   Procedure: TRANSESOPHAGEAL ECHOCARDIOGRAM (TEE);  Surgeon: Katarina H Nelson, MD;  Location: [facility];  Service: Cardiovascular;  Laterality: N/A;    Social History   Socioeconomic History  . Marital status: Married    Spouse name: earl  . Number of children: 2  . Years of education: 13  . Highest education level: Not on file  Occupational History  . Occupation: retired  Social Needs  . Financial resource strain: Not on file  . Food insecurity    Worry: Not on file    Inability: Not on file  . Transportation needs    Medical: Not on file    Non-medical: Not on file  Tobacco Use  . Smoking status: Former Smoker    Packs/day: 1.00    Years: 50.00    Pack years: 50.00    Types: Cigarettes    Start date: 01/10/1961    Quit date: 01/10/2005    Years since quitting: 13.9  . Smokeless tobacco: Former User    Quit date: 01/10/2005  Substance and Sexual Activity  . Alcohol use: No    Alcohol/week: 0.0 standard drinks  . Drug use: No  . Sexual activity: Not Currently    Partners: Male  Lifestyle  . Physical activity    Days per week: Not on file    Minutes per session: Not on file  . Stress: Not on file  Relationships  . Social connections    Talks on phone: Not on file    Gets together: Not on file    Attends religious service: Not on file    Active member of club or organization: Not on file    Attends meetings of clubs or organizations: Not on file    Relationship status: Not on file  . Intimate partner violence    Fear of current or ex partner: Not on file    Emotionally abused: Not on file    Physically abused: Not on file    Forced sexual activity: Not on file  Other Topics Concern  . Not on file  Social History Narrative    Moved to this area in 1979 from [place].    Married for over 50 years. Has two children. Lives with husband Toby (earl)   Likes to read   Retired from School system.   Enjoys time with family, beach, read, exericse.   Walk dog.   Eats all food groups.   Wear seatbelt.   Wear sunscreen.     Allergies  Allergen Reactions  . Penicillins Hives    Has patient had a PCN reaction   causing immediate rash, facial/tongue/throat swelling, SOB or lightheadedness with hypotension: Yes Has patient had a PCN reaction causing severe rash involving mucus membranes or skin necrosis: No Has patient had a PCN reaction that required hospitalization No Has patient had a PCN reaction occurring within the last 10 years: No If all of the above answers are "NO", then may proceed with Cephalosporin use.      Outpatient Medications Prior to Visit  Medication Sig Dispense Refill  . ACCU-CHEK FASTCLIX LANCETS MISC CHECK BLOOD GLUCOSE FOUR TIMES DAILY 408 each 2  . ACCU-CHEK GUIDE test strip USE AS INSTRUCTED FOUR TIMES DAILY 350 strip 1  . ACCU-CHEK SMARTVIEW test strip CHECK BLOOD GLUCOSE FOUR TIMES DAILY 400 each 2  . albuterol (PROVENTIL HFA;VENTOLIN HFA) 108 (90 Base) MCG/ACT inhaler Inhale 2 puffs into the lungs every 6 (six) hours as needed for wheezing or shortness of breath. 1 Inhaler 0  . Alcohol Swabs (B-D SINGLE USE SWABS REGULAR) PADS USE FOUR TIMES DAILY 400 each 2  . atenolol (TENORMIN) 50 MG tablet TAKE 1 TABLET EVERY DAY 90 tablet 0  . atorvastatin (LIPITOR) 40 MG tablet TAKE 1 TABLET EVERY DAY 90 tablet 3  . b complex vitamins tablet Take 1 tablet by mouth daily.    . Biotin 5000 MCG CAPS Take 1 capsule by mouth daily.    . Blood Glucose Monitoring Suppl (ACCU-CHEK GUIDE) w/Device KIT 1 each by Does not apply route 4 (four) times daily. 1 kit 0  . Calcium-Phosphorus-Vitamin D (CALCIUM GUMMIES PO) Take 500-1,000 mg by mouth daily.    . chlorpheniramine (EQ CHLORTABS) 4 MG tablet Take 4 mg by  mouth 2 (two) times daily as needed for allergies.    . cholecalciferol (VITAMIN D) 1000 units tablet Take 1,000 Units by mouth daily.    . diltiazem (CARDIZEM CD) 180 MG 24 hr capsule Take 1 capsule (180 mg total) by mouth daily. 90 capsule 3  . diphenhydrAMINE (BENADRYL) 25 MG tablet Take 25 mg by mouth daily.    . ELIQUIS 5 MG TABS tablet TAKE 1 TABLET TWICE DAILY 180 tablet 3  . fluticasone furoate-vilanterol (BREO ELLIPTA) 100-25 MCG/INH AEPB Inhale 1 puff into the lungs daily. 2 each 0  . glucose blood (ACCU-CHEK GUIDE) test strip Use as instructed 4 x daily 400 each 1  . insulin aspart (NOVOLOG) 100 UNIT/ML injection Inject 10-16 Units into the skin 3 (three) times daily with meals. 60 mL 2  . insulin detemir (LEVEMIR) 100 UNIT/ML injection Inject 0.5 mLs (50 Units total) into the skin at bedtime. 60 mL 1  . ipratropium-albuterol (DUONEB) 0.5-2.5 (3) MG/3ML SOLN Take 3 mLs by nebulization every 6 (six) hours as needed (wheezing/ shortness of breath).    . liraglutide (VICTOZA) 18 MG/3ML SOPN Inject 0.3 mLs (1.8 mg total) into the skin daily. 27 mL 1  . lisinopril (PRINIVIL,ZESTRIL) 20 MG tablet TAKE 1 TABLET (20 MG TOTAL) BY MOUTH DAILY. (Patient taking differently: Take 10 mg by mouth daily. Patient is currently taking a 1/2 tablet due to low BP) 90 tablet 0  . Misc Natural Products (ESTROVEN + ENERGY MAX STRENGTH) TABS Take 1 tablet by mouth daily.    . Multiple Vitamin (MULTIVITAMIN) tablet Take 1 tablet by mouth daily.    . Omega-3 Fatty Acids (FISH OIL) 1200 MG CAPS Take 1 capsule by mouth daily.    . omeprazole (PRILOSEC) 20 MG capsule TAKE 1 CAPSULE EVERY DAY 90 capsule 1  . sertraline (ZOLOFT) 100 MG tablet   TAKE 1 TABLET (100 MG TOTAL) BY MOUTH DAILY. 90 tablet 0  . torsemide (DEMADEX) 20 MG tablet Take 20 mg by mouth daily.    . vitamin E (VITAMIN E) 400 UNIT capsule Take 400 Units by mouth daily.    . flecainide (TAMBOCOR) 100 MG tablet TAKE 1 TABLET TWICE DAILY ( DOSE INCREASE )  (Patient not taking: Reported on 12/13/2018) 180 tablet 3   No facility-administered medications prior to visit.      Objective:    Vitals:   12/13/18 1359  BP: 124/78  Pulse: 85  Temp: (!) 97.4 F (36.3 C)  TempSrc: Temporal  SpO2: 98%  Weight: 176 lb 6.4 oz (80 kg)  Height: 5' 3" (1.6 m)   98% on 2 LPM  BMI Readings from Last 3 Encounters:  12/13/18 31.25 kg/m  11/06/18 30.82 kg/m  09/12/18 31.23 kg/m   Wt Readings from Last 3 Encounters:  12/13/18 176 lb 6.4 oz (80 kg)  11/06/18 174 lb (78.9 kg)  09/12/18 176 lb 4.8 oz (80 kg)   GEN: frail elderly woman in NAD HEENT: MMM, no thrush CV: RRR, split P2, holosystolic murmur, ext warm PULM: occasional crackles at bases GI: Soft, +BS EXT: No edmea NEURO: Moves all 4 ext, globally weak PSYCH: AOx3, good insight SKIN: pale, multiple areas of eczema, left finger wound healing    CBC    Component Value Date/Time   WBC 6.3 09/12/2018 1201   RBC 3.68 (L) 09/12/2018 1201   HGB 11.0 (L) 09/12/2018 1201   HCT 32.9 (L) 09/12/2018 1201   PLT 141.0 (L) 09/12/2018 1201   MCV 89.3 09/12/2018 1201   MCH 29.2 02/16/2018 1508   MCHC 33.5 09/12/2018 1201   RDW 15.4 09/12/2018 1201   LYMPHSABS 1.0 09/12/2018 1201   MONOABS 0.5 09/12/2018 1201   EOSABS 0.1 09/12/2018 1201   BASOSABS 0.0 09/12/2018 1201     Chest Imaging: CT chest Nov 2020 Mosacism and septal thickening, enlarging mediastinal adenopathy  Pulmonary Functions Testing Results: PFT Results Latest Ref Rng & Units 11/06/2018  FVC-Pre L 1.12  FVC-Predicted Pre % 41  FVC-Post L 1.27  FVC-Predicted Post % 47  Pre FEV1/FVC % % 75  Post FEV1/FCV % % 76  FEV1-Pre L 0.84  FEV1-Predicted Pre % 41  FEV1-Post L 0.97  DLCO UNC% % 51  DLCO COR %Predicted % 99  TLC L 3.66  TLC % Predicted % 74  RV % Predicted % 105     Pathology:  07/30/18- bronchial wash read as no malignant cells, cannot find AFB smear  Echocardiogram:  IMPRESSIONS  1. The left  ventricle has normal systolic function with an ejection fraction of 60-65%. The cavity size was normal. There is mildly increased left ventricular wall thickness. Left ventricular diastolic Doppler parameters are consistent with impaired  relaxation Elevated mean left atrial pressure.  2. The right ventricle has normal systolic function. The cavity was mildly enlarged. There is no increase in right ventricular wall thickness.  3. There is systolic flattening of the ventricular septum suggesting elevated RV pressure.  4. Left atrial size was severely dilated.  5. Right atrial size was severely dilated.  6. Diffuse calcification of MV leaflets. Severe thickening of the mitral valve leaflet. Mild mitral valve stenosis.  7. The tricuspid valve is normal in structure.  8. The aortic valve is tricuspid Mild thickening of the aortic valve no stenosis of the aortic valve.  9. Pulmonary hypertension is moderately elevated, PASP 59. 10.   Right atrial pressure is estimated at 3 mmHg  EKG in office RBBB, sinus bradycardia    Assessment & Plan:   Abnormal CT Chest- c/w ILD but with enlarging adenopathy should also rule out atypical infections or lymphoma. Chronic hypoxemia- related to above, continues to use and benefit from oxygen Persistent asymptomatic afib- previously on flecainide Anticoagulated- needs to hold for procedure Groups 2/3 pulmonary HTN Concerning skin lesions- s/p biopsy, she says it was ?melanoma, will ask derm to send over records IDDM- makes empiric steroids less than ideal   - Spent >40 minutes discussing risks/benefits/alternatives to bronchoscopy and what our differential going forward is, she is agreeable for tissue sampling - Hold eliquis starting Sat night - Bronch/EBUS/fluoro Tuesday 12/18/18, hold DM meds day of - Will call her with results, likely benefit from virtual visit as this will take a bit to discuss   Current Outpatient Medications:  .  ACCU-CHEK FASTCLIX  LANCETS MISC, CHECK BLOOD GLUCOSE FOUR TIMES DAILY, Disp: 408 each, Rfl: 2 .  ACCU-CHEK GUIDE test strip, USE AS INSTRUCTED FOUR TIMES DAILY, Disp: 350 strip, Rfl: 1 .  ACCU-CHEK SMARTVIEW test strip, CHECK BLOOD GLUCOSE FOUR TIMES DAILY, Disp: 400 each, Rfl: 2 .  albuterol (PROVENTIL HFA;VENTOLIN HFA) 108 (90 Base) MCG/ACT inhaler, Inhale 2 puffs into the lungs every 6 (six) hours as needed for wheezing or shortness of breath., Disp: 1 Inhaler, Rfl: 0 .  Alcohol Swabs (B-D SINGLE USE SWABS REGULAR) PADS, USE FOUR TIMES DAILY, Disp: 400 each, Rfl: 2 .  atenolol (TENORMIN) 50 MG tablet, TAKE 1 TABLET EVERY DAY, Disp: 90 tablet, Rfl: 0 .  atorvastatin (LIPITOR) 40 MG tablet, TAKE 1 TABLET EVERY DAY, Disp: 90 tablet, Rfl: 3 .  b complex vitamins tablet, Take 1 tablet by mouth daily., Disp: , Rfl:  .  Biotin 5000 MCG CAPS, Take 1 capsule by mouth daily., Disp: , Rfl:  .  Blood Glucose Monitoring Suppl (ACCU-CHEK GUIDE) w/Device KIT, 1 each by Does not apply route 4 (four) times daily., Disp: 1 kit, Rfl: 0 .  Calcium-Phosphorus-Vitamin D (CALCIUM GUMMIES PO), Take 500-1,000 mg by mouth daily., Disp: , Rfl:  .  chlorpheniramine (EQ CHLORTABS) 4 MG tablet, Take 4 mg by mouth 2 (two) times daily as needed for allergies., Disp: , Rfl:  .  cholecalciferol (VITAMIN D) 1000 units tablet, Take 1,000 Units by mouth daily., Disp: , Rfl:  .  diltiazem (CARDIZEM CD) 180 MG 24 hr capsule, Take 1 capsule (180 mg total) by mouth daily., Disp: 90 capsule, Rfl: 3 .  diphenhydrAMINE (BENADRYL) 25 MG tablet, Take 25 mg by mouth daily., Disp: , Rfl:  .  ELIQUIS 5 MG TABS tablet, TAKE 1 TABLET TWICE DAILY, Disp: 180 tablet, Rfl: 3 .  fluticasone furoate-vilanterol (BREO ELLIPTA) 100-25 MCG/INH AEPB, Inhale 1 puff into the lungs daily., Disp: 2 each, Rfl: 0 .  glucose blood (ACCU-CHEK GUIDE) test strip, Use as instructed 4 x daily, Disp: 400 each, Rfl: 1 .  insulin aspart (NOVOLOG) 100 UNIT/ML injection, Inject 10-16 Units  into the skin 3 (three) times daily with meals., Disp: 60 mL, Rfl: 2 .  insulin detemir (LEVEMIR) 100 UNIT/ML injection, Inject 0.5 mLs (50 Units total) into the skin at bedtime., Disp: 60 mL, Rfl: 1 .  ipratropium-albuterol (DUONEB) 0.5-2.5 (3) MG/3ML SOLN, Take 3 mLs by nebulization every 6 (six) hours as needed (wheezing/ shortness of breath)., Disp: , Rfl:  .  liraglutide (VICTOZA) 18 MG/3ML SOPN, Inject 0.3 mLs (1.8 mg total)   into the skin daily., Disp: 27 mL, Rfl: 1 .  lisinopril (PRINIVIL,ZESTRIL) 20 MG tablet, TAKE 1 TABLET (20 MG TOTAL) BY MOUTH DAILY. (Patient taking differently: Take 10 mg by mouth daily. Patient is currently taking a 1/2 tablet due to low BP), Disp: 90 tablet, Rfl: 0 .  Misc Natural Products (ESTROVEN + ENERGY MAX STRENGTH) TABS, Take 1 tablet by mouth daily., Disp: , Rfl:  .  Multiple Vitamin (MULTIVITAMIN) tablet, Take 1 tablet by mouth daily., Disp: , Rfl:  .  Omega-3 Fatty Acids (FISH OIL) 1200 MG CAPS, Take 1 capsule by mouth daily., Disp: , Rfl:  .  omeprazole (PRILOSEC) 20 MG capsule, TAKE 1 CAPSULE EVERY DAY, Disp: 90 capsule, Rfl: 1 .  sertraline (ZOLOFT) 100 MG tablet, TAKE 1 TABLET (100 MG TOTAL) BY MOUTH DAILY., Disp: 90 tablet, Rfl: 0 .  torsemide (DEMADEX) 20 MG tablet, Take 20 mg by mouth daily., Disp: , Rfl:  .  vitamin E (VITAMIN E) 400 UNIT capsule, Take 400 Units by mouth daily., Disp: , Rfl:  .  flecainide (TAMBOCOR) 100 MG tablet, TAKE 1 TABLET TWICE DAILY ( DOSE INCREASE ) (Patient not taking: Reported on 12/13/2018), Disp: 180 tablet, Rfl: 3   Jed Kutch C Haliyah Fryman, MD [facility] 12/13/2018 2:54 PM   

## 2018-12-13 NOTE — Progress Notes (Signed)
Synopsis: Referred in Sept 2020 for Abnormal imaging, Pulm HTN by [name], MD  Subjective:   PATIENT ID: Ellen Jordan GENDER: female DOB: 1945/11/26, MRN: [id number]  Chief Complaint  Patient presents with  . Follow-up    CT 11.25. Here to discuss results.     History as below:   This is a 73 year old with a history of oxygen dependent ?COPD who is presenting for evaluation of pulmonary hypertension.  She was actually following with a Dr. Luan Pulling in [place].  A CT scan ordered by Dr. Luan Pulling revealed numerous subcentimeter pulmonary nodules with a question of MAC.  She underwent a bronchoscopy with BAL which did not show evidence of MAC.  At some point during her work-up she had an echocardiogram that showed pulmonary hypertension with an RVSP of 59.  She presents to pulmonary clinic for a second opinion.  Her dyspnea progressed over past year.  She was previously MMRC 0-1 on RA.  Now MMRC 1-2 on 2L O2.  Without O2 she quickly feels fatigued and oddly also gets back pains.  There is occasional wheezing but not consistently.  She has a runny nose but this has improved since she started O2.  Her cardiac history is a little unusual too.  She has refractory symptomatic paroxysmal afib s/p failed cardioversion now on flecainide that was titrated up around Sept 2019.  Comorbidities include paroxysmal atrial fibrillation on anticoagulation, diabetes.  Her home inhaler regimen seems to only be duo nebs PRN which work for a short while.  She has a father who had emphysema.  Patient has a 50-pack-year smoking history quit 13 years ago.  She may have had some asbestos exposure in distant past but otherwise no concerning exposure history.  Used to work as Consulting civil engineer.  Suspicion here is some combination of COPD and medication-associated ILD.  Rheum workup neg. PFTs with moderate restriction, preserved ratio.  DLCO markedly reduced but corrects for alveolar ventilation.  Echo with  mitral stenosis earlier this year not quantified, TEE 2017 with mild mitral disease, BNP neg last visit  A1c 6.4%, on a good deal of insulin  Last visit, referred to dermatology, took her off flecainide, awaited repeat CT ordered above.        Derm did biopsy ? Melanoma, records to be faxed in  CT showing worsening septal thickening and adenopathy. Breathing also getting worse despite stopping flecainide. Patient had wanted to avoid bronch but I think we are out of options. MMRC 2 dyspnea.  ROS + symptoms in bold Fatigue, malaise Fevers, chills, weight loss Nausea, vomiting, diarrhea Shortness of breath, wheezing, cough Chest pain, palpitations, lower ext edema Rash, concerning lesions   Past Medical History:  Diagnosis Date  . Allergic rhinitis   . Allergy   . Anxiety   . Atrial fibrillation ([place])   . Carpal tunnel syndrome    left  . Cataract   . Depression   . Diabetic neuropathy ([place])   . Essential hypertension   . GERD (gastroesophageal reflux disease)   . Mixed hyperlipidemia   . PAF (paroxysmal atrial fibrillation) ([place])    a. s/p TEE cardioversion March 2017. b. Recurrence by event monitor 04/2016.  Marland Kitchen RBBB   . Restless leg syndrome   . Type 2 diabetes mellitus ([place])   . Uterine cancer ([place])      Family History  Problem Relation Age of Onset  . Heart disease Mother 31  . Hyperlipidemia Mother   . Diabetes Brother   .  COPD Father 95       emphysema  . Hypertension Father   . Hyperlipidemia Father   . Diabetes Daughter   . AAA (abdominal aortic aneurysm) Son   . Diabetes Brother   . Diabetes Brother   . Diabetes Brother      Past Surgical History:  Procedure Laterality Date  . ABDOMINAL HYSTERECTOMY     uterine cancer  . BACK SURGERY    . CARDIOVERSION N/A 03/11/2015   Procedure: CARDIOVERSION;  Surgeon: [name], MD;  Location: [facility];  Service: Cardiovascular;  Laterality: N/A;  . CHOLECYSTECTOMY    . EYE SURGERY     cataracts   . FLEXIBLE BRONCHOSCOPY N/A 07/30/2018   Procedure: FLEXIBLE BRONCHOSCOPY;  Surgeon: Sinda Du, MD;  [facility];  Service: Cardiopulmonary;  Laterality: N/A;  . SPINE SURGERY    . TEE WITHOUT CARDIOVERSION N/A 03/11/2015   Procedure: TRANSESOPHAGEAL ECHOCARDIOGRAM (TEE);  Surgeon: [name], MD;  Location: [facility] END[facility];  Service: Cardiovascular;  Laterality: N/A;    Social History   Socioeconomic History  . Marital status: Married    Spouse name: earl  . Number of children: 2  . Years of education: 55  . Highest education level: Not on file  Occupational History  . Occupation: retired  Scientific laboratory technician  . Financial resource strain: Not on file  . Food insecurity    Worry: Not on file    Inability: Not on file  . Transportation needs    Medical: Not on file    Non-medical: Not on file  Tobacco Use  . Smoking status: Former Smoker    Packs/day: 1.00    Years: 50.00    Pack years: 50.00    Types: Cigarettes    Start date: 01/10/1961    Quit date: 01/10/2005    Years since quitting: 13.9  . Smokeless tobacco: Former Systems developer    Quit date: 01/10/2005  Substance and Sexual Activity  . Alcohol use: No    Alcohol/week: 0.0 standard drinks  . Drug use: No  . Sexual activity: Not Currently    Partners: Male  Lifestyle  . Physical activity    Days per week: Not on file    Minutes per session: Not on file  . Stress: Not on file  Relationships  . Social Herbalist on phone: Not on file    Gets together: Not on file    Attends religious service: Not on file    Active member of club or organization: Not on file    Attends meetings of clubs or organizations: Not on file    Relationship status: Not on file  . Intimate partner violence    Fear of current or ex partner: Not on file    Emotionally abused: Not on file    Physically abused: Not on file    Forced sexual activity: Not on file  Other Topics Concern  . Not on file  Social History Narrative    Moved to this area in 71 from [place].    Married for over 50 years. Has two children. Lives with husband [name] (earl)   Likes to read   Retired from [employer].   Enjoys time with family, beach, read, exericse.   Walk dog.   Eats all food groups.   Wear seatbelt.   Wear sunscreen.     Allergies  Allergen Reactions  . Penicillins Hives    Has patient had a PCN reaction  causing immediate rash, facial/tongue/throat swelling, SOB or lightheadedness with hypotension: Yes Has patient had a PCN reaction causing severe rash involving mucus membranes or skin necrosis: No Has patient had a PCN reaction that required hospitalization No Has patient had a PCN reaction occurring within the last 10 years: No If all of the above answers are "NO", then may proceed with Cephalosporin use.      Outpatient Medications Prior to Visit  Medication Sig Dispense Refill  . ACCU-CHEK FASTCLIX LANCETS MISC CHECK BLOOD GLUCOSE FOUR TIMES DAILY 408 each 2  . ACCU-CHEK GUIDE test strip USE AS INSTRUCTED FOUR TIMES DAILY 350 strip 1  . ACCU-CHEK SMARTVIEW test strip CHECK BLOOD GLUCOSE FOUR TIMES DAILY 400 each 2  . albuterol (PROVENTIL HFA;VENTOLIN HFA) 108 (90 Base) MCG/ACT inhaler Inhale 2 puffs into the lungs every 6 (six) hours as needed for wheezing or shortness of breath. 1 Inhaler 0  . Alcohol Swabs (B-D SINGLE USE SWABS REGULAR) PADS USE FOUR TIMES DAILY 400 each 2  . atenolol (TENORMIN) 50 MG tablet TAKE 1 TABLET EVERY DAY 90 tablet 0  . atorvastatin (LIPITOR) 40 MG tablet TAKE 1 TABLET EVERY DAY 90 tablet 3  . b complex vitamins tablet Take 1 tablet by mouth daily.    . Biotin 5000 MCG CAPS Take 1 capsule by mouth daily.    . Blood Glucose Monitoring Suppl (ACCU-CHEK GUIDE) w/Device KIT 1 each by Does not apply route 4 (four) times daily. 1 kit 0  . Calcium-Phosphorus-Vitamin D (CALCIUM GUMMIES PO) Take 500-1,000 mg by mouth daily.    . chlorpheniramine (EQ CHLORTABS) 4 MG tablet Take 4 mg by  mouth 2 (two) times daily as needed for allergies.    . cholecalciferol (VITAMIN D) 1000 units tablet Take 1,000 Units by mouth daily.    Marland Kitchen diltiazem (CARDIZEM CD) 180 MG 24 hr capsule Take 1 capsule (180 mg total) by mouth daily. 90 capsule 3  . diphenhydrAMINE (BENADRYL) 25 MG tablet Take 25 mg by mouth daily.    Marland Kitchen ELIQUIS 5 MG TABS tablet TAKE 1 TABLET TWICE DAILY 180 tablet 3  . fluticasone furoate-vilanterol (BREO ELLIPTA) 100-25 MCG/INH AEPB Inhale 1 puff into the lungs daily. 2 each 0  . glucose blood (ACCU-CHEK GUIDE) test strip Use as instructed 4 x daily 400 each 1  . insulin aspart (NOVOLOG) 100 UNIT/ML injection Inject 10-16 Units into the skin 3 (three) times daily with meals. 60 mL 2  . insulin detemir (LEVEMIR) 100 UNIT/ML injection Inject 0.5 mLs (50 Units total) into the skin at bedtime. 60 mL 1  . ipratropium-albuterol (DUONEB) 0.5-2.5 (3) MG/3ML SOLN Take 3 mLs by nebulization every 6 (six) hours as needed (wheezing/ shortness of breath).    . liraglutide (VICTOZA) 18 MG/3ML SOPN Inject 0.3 mLs (1.8 mg total) into the skin daily. 27 mL 1  . lisinopril (PRINIVIL,ZESTRIL) 20 MG tablet TAKE 1 TABLET (20 MG TOTAL) BY MOUTH DAILY. (Patient taking differently: Take 10 mg by mouth daily. Patient is currently taking a 1/2 tablet due to low BP) 90 tablet 0  . Misc Natural Products (ESTROVEN + ENERGY MAX STRENGTH) TABS Take 1 tablet by mouth daily.    . Multiple Vitamin (MULTIVITAMIN) tablet Take 1 tablet by mouth daily.    . Omega-3 Fatty Acids (FISH OIL) 1200 MG CAPS Take 1 capsule by mouth daily.    Marland Kitchen omeprazole (PRILOSEC) 20 MG capsule TAKE 1 CAPSULE EVERY DAY 90 capsule 1  . sertraline (ZOLOFT) 100 MG tablet  TAKE 1 TABLET (100 MG TOTAL) BY MOUTH DAILY. 90 tablet 0  . torsemide (DEMADEX) 20 MG tablet Take 20 mg by mouth daily.    . vitamin E (VITAMIN E) 400 UNIT capsule Take 400 Units by mouth daily.    . flecainide (TAMBOCOR) 100 MG tablet TAKE 1 TABLET TWICE DAILY ( DOSE INCREASE )  (Patient not taking: Reported on 12/13/2018) 180 tablet 3   No facility-administered medications prior to visit.      Objective:    Vitals:   12/13/18 1359  BP: 124/78  Pulse: 85  Temp: (!) 97.4 F (36.3 C)  TempSrc: Temporal  SpO2: 98%  Weight: 176 lb 6.4 oz (80 kg)  Height: _0  (1.6 m)   98% on 2 LPM  BMI Readings from Last 3 Encounters:  12/13/18 31.25 kg/m  11/06/18 30.82 kg/m  09/12/18 31.23 kg/m   Wt Readings from Last 3 Encounters:  12/13/18 176 lb 6.4 oz (80 kg)  11/06/18 174 lb (78.9 kg)  09/12/18 176 lb 4.8 oz (80 kg)   GEN: frail elderly woman in NAD HEENT: MMM, no thrush CV: RRR, split P2, holosystolic murmur, ext warm PULM: occasional crackles at bases GI: Soft, +BS EXT: No edmea NEURO: Moves all 4 ext, globally weak PSYCH: AOx3, good insight SKIN: pale, multiple areas of eczema, left finger wound healing    CBC    Component Value Date/Time   WBC 6.3 09/12/2018 1201   RBC 3.68 (L) 09/12/2018 1201   HGB 11.0 (L) 09/12/2018 1201   HCT 32.9 (L) 09/12/2018 1201   PLT 141.0 (L) 09/12/2018 1201   MCV 89.3 09/12/2018 1201   MCH 29.2 02/16/2018 1508   MCHC 33.5 09/12/2018 1201   RDW 15.4 09/12/2018 1201   LYMPHSABS 1.0 09/12/2018 1201   MONOABS 0.5 09/12/2018 1201   EOSABS 0.1 09/12/2018 1201   BASOSABS 0.0 09/12/2018 1201     Chest Imaging: CT chest Nov 2020 Mosacism and septal thickening, enlarging mediastinal adenopathy  Pulmonary Functions Testing Results: PFT Results Latest Ref Rng & Units 11/06/2018  FVC-Pre L 1.12  FVC-Predicted Pre % 41  FVC-Post L 1.27  FVC-Predicted Post % 47  Pre FEV1/FVC % % 75  Post FEV1/FCV % % 76  FEV1-Pre L 0.84  FEV1-Predicted Pre % 41  FEV1-Post L 0.97  DLCO UNC% % 51  DLCO COR %Predicted % 99  TLC L 3.66  TLC % Predicted % 74  RV % Predicted % 105     Pathology:  07/30/18- bronchial wash read as no malignant cells, cannot find AFB smear  Echocardiogram:  IMPRESSIONS  1. The left  ventricle has normal systolic function with an ejection fraction of 60-65%. The cavity size was normal. There is mildly increased left ventricular wall thickness. Left ventricular diastolic Doppler parameters are consistent with impaired  relaxation Elevated mean left atrial pressure.  2. The right ventricle has normal systolic function. The cavity was mildly enlarged. There is no increase in right ventricular wall thickness.  3. There is systolic flattening of the ventricular septum suggesting elevated RV pressure.  4. Left atrial size was severely dilated.  5. Right atrial size was severely dilated.  6. Diffuse calcification of MV leaflets. Severe thickening of the mitral valve leaflet. Mild mitral valve stenosis.  7. The tricuspid valve is normal in structure.  8. The aortic valve is tricuspid Mild thickening of the aortic valve no stenosis of the aortic valve.  9. Pulmonary hypertension is moderately elevated, PASP 59. 10.  Right atrial pressure is estimated at 3 mmHg  EKG in office RBBB, sinus bradycardia    Assessment & Plan:   Abnormal CT Chest- c/w ILD but with enlarging adenopathy should also rule out atypical infections or lymphoma. Chronic hypoxemia- related to above, continues to use and benefit from oxygen Persistent asymptomatic afib- previously on flecainide Anticoagulated- needs to hold for procedure Groups 2/3 pulmonary HTN Concerning skin lesions- s/p biopsy, she says it was ?melanoma, will ask derm to send over records IDDM- makes empiric steroids less than ideal   - Spent >40 minutes discussing risks/benefits/alternatives to bronchoscopy and what our differential going forward is, she is agreeable for tissue sampling - Hold eliquis starting Sat night - Bronch/EBUS/fluoro Tuesday 12/18/18, hold DM meds day of - Will call her with results, likely benefit from virtual visit as this will take a bit to discuss   Current Outpatient Medications:  .  ACCU-CHEK FASTCLIX  LANCETS MISC, CHECK BLOOD GLUCOSE FOUR TIMES DAILY, Disp: 408 each, Rfl: 2 .  ACCU-CHEK GUIDE test strip, USE AS INSTRUCTED FOUR TIMES DAILY, Disp: 350 strip, Rfl: 1 .  ACCU-CHEK SMARTVIEW test strip, CHECK BLOOD GLUCOSE FOUR TIMES DAILY, Disp: 400 each, Rfl: 2 .  albuterol (PROVENTIL HFA;VENTOLIN HFA) 108 (90 Base) MCG/ACT inhaler, Inhale 2 puffs into the lungs every 6 (six) hours as needed for wheezing or shortness of breath., Disp: 1 Inhaler, Rfl: 0 .  Alcohol Swabs (B-D SINGLE USE SWABS REGULAR) PADS, USE FOUR TIMES DAILY, Disp: 400 each, Rfl: 2 .  atenolol (TENORMIN) 50 MG tablet, TAKE 1 TABLET EVERY DAY, Disp: 90 tablet, Rfl: 0 .  atorvastatin (LIPITOR) 40 MG tablet, TAKE 1 TABLET EVERY DAY, Disp: 90 tablet, Rfl: 3 .  b complex vitamins tablet, Take 1 tablet by mouth daily., Disp: , Rfl:  .  Biotin 5000 MCG CAPS, Take 1 capsule by mouth daily., Disp: , Rfl:  .  Blood Glucose Monitoring Suppl (ACCU-CHEK GUIDE) w/Device KIT, 1 each by Does not apply route 4 (four) times daily., Disp: 1 kit, Rfl: 0 .  Calcium-Phosphorus-Vitamin D (CALCIUM GUMMIES PO), Take 500-1,000 mg by mouth daily., Disp: , Rfl:  .  chlorpheniramine (EQ CHLORTABS) 4 MG tablet, Take 4 mg by mouth 2 (two) times daily as needed for allergies., Disp: , Rfl:  .  cholecalciferol (VITAMIN D) 1000 units tablet, Take 1,000 Units by mouth daily., Disp: , Rfl:  .  diltiazem (CARDIZEM CD) 180 MG 24 hr capsule, Take 1 capsule (180 mg total) by mouth daily., Disp: 90 capsule, Rfl: 3 .  diphenhydrAMINE (BENADRYL) 25 MG tablet, Take 25 mg by mouth daily., Disp: , Rfl:  .  ELIQUIS 5 MG TABS tablet, TAKE 1 TABLET TWICE DAILY, Disp: 180 tablet, Rfl: 3 .  fluticasone furoate-vilanterol (BREO ELLIPTA) 100-25 MCG/INH AEPB, Inhale 1 puff into the lungs daily., Disp: 2 each, Rfl: 0 .  glucose blood (ACCU-CHEK GUIDE) test strip, Use as instructed 4 x daily, Disp: 400 each, Rfl: 1 .  insulin aspart (NOVOLOG) 100 UNIT/ML injection, Inject 10-16 Units  into the skin 3 (three) times daily with meals., Disp: 60 mL, Rfl: 2 .  insulin detemir (LEVEMIR) 100 UNIT/ML injection, Inject 0.5 mLs (50 Units total) into the skin at bedtime., Disp: 60 mL, Rfl: 1 .  ipratropium-albuterol (DUONEB) 0.5-2.5 (3) MG/3ML SOLN, Take 3 mLs by nebulization every 6 (six) hours as needed (wheezing/ shortness of breath)., Disp: , Rfl:  .  liraglutide (VICTOZA) 18 MG/3ML SOPN, Inject 0.3 mLs (1.8 mg total)  into the skin daily., Disp: 27 mL, Rfl: 1 .  lisinopril (PRINIVIL,ZESTRIL) 20 MG tablet, TAKE 1 TABLET (20 MG TOTAL) BY MOUTH DAILY. (Patient taking differently: Take 10 mg by mouth daily. Patient is currently taking a 1/2 tablet due to low BP), Disp: 90 tablet, Rfl: 0 .  Misc Natural Products (ESTROVEN + ENERGY MAX STRENGTH) TABS, Take 1 tablet by mouth daily., Disp: , Rfl:  .  Multiple Vitamin (MULTIVITAMIN) tablet, Take 1 tablet by mouth daily., Disp: , Rfl:  .  Omega-3 Fatty Acids (FISH OIL) 1200 MG CAPS, Take 1 capsule by mouth daily., Disp: , Rfl:  .  omeprazole (PRILOSEC) 20 MG capsule, TAKE 1 CAPSULE EVERY DAY, Disp: 90 capsule, Rfl: 1 .  sertraline (ZOLOFT) 100 MG tablet, TAKE 1 TABLET (100 MG TOTAL) BY MOUTH DAILY., Disp: 90 tablet, Rfl: 0 .  torsemide (DEMADEX) 20 MG tablet, Take 20 mg by mouth daily., Disp: , Rfl:  .  vitamin E (VITAMIN E) 400 UNIT capsule, Take 400 Units by mouth daily., Disp: , Rfl:  .  flecainide (TAMBOCOR) 100 MG tablet, TAKE 1 TABLET TWICE DAILY ( DOSE INCREASE ) (Patient not taking: Reported on 12/13/2018), Disp: 180 tablet, Rfl: 3   [name], MD [facility] 12/13/2018 2:54 PM

## 2018-12-14 ENCOUNTER — Encounter (HOSPITAL_COMMUNITY): Payer: Self-pay | Admitting: *Deleted

## 2018-12-14 ENCOUNTER — Other Ambulatory Visit (HOSPITAL_COMMUNITY)
Admission: RE | Admit: 2018-12-14 | Discharge: 2018-12-14 | Disposition: A | Discharge status: Home / Self Care | Payer: Medicare HMO | Source: Ambulatory Visit | Attending: Internal Medicine | Admitting: Internal Medicine

## 2018-12-14 ENCOUNTER — Other Ambulatory Visit: Payer: Self-pay

## 2018-12-14 ENCOUNTER — Telehealth: Payer: Self-pay | Admitting: Internal Medicine

## 2018-12-14 ENCOUNTER — Other Ambulatory Visit (HOSPITAL_COMMUNITY): Payer: Medicare HMO

## 2018-12-14 DIAGNOSIS — Z01812 Encounter for preprocedural laboratory examination: Secondary | ICD-10-CM | POA: Diagnosis not present

## 2018-12-14 DIAGNOSIS — Z20828 Contact with and (suspected) exposure to other viral communicable diseases: Secondary | ICD-10-CM | POA: Diagnosis not present

## 2018-12-14 LAB — SARS CORONAVIRUS 2 (TAT 6-24 HRS): SARS Coronavirus 2: NEGATIVE

## 2018-12-14 NOTE — Telephone Encounter (Signed)
Instructions from OV 12/3  Interstitial lung disease.  These are associated with certain medications.  They can also be associated with other autoimmune diseases, your tests for this are negative.  Dr. Particia Nearing, dermatology- we will get records of your biopsy and office visit.  Bronchoscopy: scheduled Tuesday 12/18/2018 at [facility]. Please arrive by 11:15. Nothing to eat after midnight. Take medications with a sip of water. No dark fluids.   Your covid test has been scheduled for 12/14/2018 at 1245pm at [address]   Take your last dose of your eliquis Sat evening. DO NOT RESUME UNTIL INSTRUCTED TO DO SO BY DR. Tamala Julian.   We will call you regarding insulin instructions.     Called [facility] and spoke with Helene Kelp letting her know that Dr. Tamala Julian did tell pt to stop Eliquis Sat 12/5 and not to resume it until after bronch. Helene Kelp verbalized understanding. Nothing further needed.

## 2018-12-14 NOTE — Progress Notes (Signed)
Spoke with Ellen Jordan for pre-op call. Ellen Jordan has hx of A-fib. Sees Dr. Domenic Polite, last office visit was 02/16/18. Ellen Jordan is on Eliquis, was instructed to hold it prior to surgery by Dr. Tamala Julian. Last dose is Saturday, 12/15/18. Ellen Jordan denies any recent chest pain. Ellen Jordan is a type 2 diabetic. Last A1C was 6.4 on 07/23/18. Ellen Jordan states her fasting blood sugar is usually between 130-200. Instructed Ellen Jordan to take 1/2 of her regular dose of Levemir Insulin Monday PM. She will take 25 units. Instructed Ellen Jordan to check her blood sugar when she gets up Tuesday AM and every 2 hours until she leaves for the hospital. If blood sugar is >220 take 1/2 of usual correction dose of Novolog insulin. If blood sugar is 70 or below, treat with 1/2 cup of clear juice (apple or cranberry) and recheck blood sugar 15 minutes after drinking juice. If blood sugar continues to be 70 or below, call the Short Stay department and ask to speak to a nurse. Ellen Jordan voiced understanding. Also instructed her not to take her Victoza Tuesday AM.  Ellen Jordan had her Covid test done today. She states she has been in quarantine since the test was done and understands she needs to be in quarantine until she comes to the hospital.

## 2018-12-17 ENCOUNTER — Ambulatory Visit (HOSPITAL_COMMUNITY): Payer: Medicare HMO | Admitting: Physician Assistant

## 2018-12-17 ENCOUNTER — Telehealth: Payer: Self-pay

## 2018-12-17 NOTE — Telephone Encounter (Signed)
-----  Message from [name], MD sent at 12/13/2018  2:55 PM EST ----- Looks like she is on insulin Tell her to take half of her long acting insulin the night before surgery (Mondy night) and none of her premeal insulin or trajenta the day of since she will be NPO.  Reach out if she has questions.  Ellen Jordan

## 2018-12-17 NOTE — Anesthesia Preprocedure Evaluation (Addendum)
Anesthesia Evaluation  Patient identified by MRN, date of birth, ID band Patient awake    Reviewed: Allergy & Precautions, NPO status , Patient's Chart, lab work & pertinent test results, reviewed documented beta blocker date and time   History of Anesthesia Complications (+) PONV and history of anesthetic complications  Airway Mallampati: II  TM Distance: >3 FB Neck ROM: Full    Dental no notable dental hx.    Pulmonary shortness of breath, former smoker,  Uses rescue inhaler once a day  Quit smoking 2007, 50 pack year history   Pulmonary exam normal breath sounds clear to auscultation       Cardiovascular hypertension, Pt. on medications Normal cardiovascular exam Rhythm:Regular Rate:Normal  Afib on eliquis - last dose 12/5   Neuro/Psych    GI/Hepatic GERD  Medicated and Controlled,  Endo/Other  diabetes, Type 2, Oral Hypoglycemic AgentsLast A1c 6.4  Fingerstick this AM 205  Took 1/2 her levemir this AM (25 units instead of 50units)  Renal/GU      Musculoskeletal negative musculoskeletal ROS (+)   Abdominal   Peds  Hematology   Anesthesia Other Findings [name]  Reproductive/Obstetrics negative OB ROS                                                             Anesthesia Evaluation  Patient identified by MRN, date of birth, ID band Patient awake    Reviewed: Allergy & Precautions, NPO status , Patient's Chart, lab work & pertinent test results  Airway Mallampati: III  TM Distance: >3 FB Neck ROM: Full    Dental no notable dental hx. (+) Edentulous Upper, Edentulous Lower   Pulmonary COPD,  COPD inhaler and oxygen dependent, former smoker,  Wears 2 l o2 [place] at home -very limited ET -here for bronch    Pulmonary exam normal breath sounds clear to auscultation       Cardiovascular Exercise Tolerance: Poor hypertension, Pt. on medications Normal cardiovascular exam+  dysrhythmias Atrial Fibrillation II Rhythm:Regular Rate:Normal  Appears sinus today  No h/o recent DC CV  States husband cares for her  Limited ET  Denies CP   Neuro/Psych Anxiety Depression  Neuromuscular disease negative psych ROS   GI/Hepatic Neg liver ROS, GERD  Medicated and Controlled,  Endo/Other  negative endocrine ROSdiabetes, Type 1, Insulin Dependent  Renal/GU negative Renal ROS  negative genitourinary   Musculoskeletal negative musculoskeletal ROS (+)   Abdominal   Peds negative pediatric ROS (+)  Hematology negative hematology ROS (+)   Anesthesia Other Findings   Reproductive/Obstetrics negative OB ROS                             Anesthesia Physical Anesthesia Plan  ASA: IV  Anesthesia Plan: General   Post-op Pain Management:    Induction: Intravenous  PONV Risk Score and Plan: 3 and Ondansetron, Propofol infusion, TIVA and Treatment may vary due to age or medical condition  Airway Management Planned: Nasal Cannula and Simple Face Mask  Additional Equipment:   Intra-op Plan:   Post-operative Plan: Extubation in OR  Informed Consent: I have reviewed the patients History and Physical, chart, labs and discussed the procedure including the risks, benefits and alternatives for the proposed anesthesia with the patient  or authorized representative who has indicated his/her understanding and acceptance.     Dental advisory given  Plan Discussed with: CRNA  Anesthesia Plan Comments: (Pl;an Full PPE use  Plan GA with GETA as needed -WTP with same  D/w pt unlikely need for postop ventilation- WTP)        Anesthesia Quick Evaluation  Anesthesia Physical Anesthesia Plan  ASA: III  Anesthesia Plan: General   Post-op Pain Management:    Induction: Intravenous  PONV Risk Score and Plan: 4 or greater and Ondansetron, Dexamethasone, Scopolamine patch - Pre-op, Treatment may vary due to age or medical condition  and Metaclopromide  Airway Management Planned: Oral ETT  Additional Equipment: None  Intra-op Plan:   Post-operative Plan: Extubation in OR  Informed Consent: I have reviewed the patients History and Physical, chart, labs and discussed the procedure including the risks, benefits and alternatives for the proposed anesthesia with the patient or authorized representative who has indicated his/her understanding and acceptance.     Dental advisory given  Plan Discussed with: CRNA  Anesthesia Plan Comments: (Follows with cardiology for hx of afib/flutter s/p failed cardioversion. Last seen by Dr. Domenic Polite 02/16/18, discussed worsening SOB and fatigue. Echo was done 02/23/18 and Dr. Domenic Polite commented on the result "Results reviewed. LVEF is normal at 60 to 65%. There is diastolic dysfunction and therefore I would check to see how she did with the limited course of Lasix in terms of shortness of breath overall. There is mild mitral stenosis which is unlikely to be causing symptoms, however she does have evidence of pulmonary hypertension which I wonder could be potentially related to underlying chronic lung disease. Please make a referral for her to see Dr. Luan Pulling for formal pulmonary evaluation. If she has felt better on Lasix, please let me know and we can sort out a longer term prescription."  She was initially evaluated by Dr. Luan Pulling for severe COPD and pulm HTN, on 2L continuous O2.  CT scan ordered by Dr. Luan Pulling revealed numerous subcentimeter pulmonary nodules with a question of MAC. She underwent a bronchoscopy with BAL which did not show evidence of MAC. She was referred to Dr. Ina Homes for second opinion of pulm diease. Combination of COPD and medication associated ILD suspected. PFTs with moderate restriction, preserved ratio.  DLCO markedly reduced but corrects for alveolar ventilation. Per Dr. Thompson Caul last note 12/13/18 "CT showing worsening septal thickening and adenopathy.  Breathing also getting worse despite stopping flecainide. Patient had wanted to avoid bronch but I think we are out of options. MMRC 2 dyspnea."  IDDMII, last A1c 6.4 on 07/23/18.  Will need DOS labs and eval.  CT chest 12/04/18: IMPRESSION: 1. Bulky mediastinal adenopathy as described. Lymphoproliferative disorder or metastatic process and lymphangitic carcinomatosis is not excluded. Sarcoidosis is considered in the differential based on the presence of adenopathy, potential interstitial lung disease with signs of nodular septal thickening and fissural thickening. Nodal biopsy may be warranted. 2. New areas of its tree-in-bud opacity and ill-defined nodularity in the inferior right upper lobe and in the right middle lobe. Findings raising the question atypical infection. 3. Multiple pulmonary nodules are not changed. Continued follow-up based on the largest nodule in the right upper lobe is suggested at 18 months following the scan from July of 2020; however, follow-up may occur sooner based on other findings described above it has. 4. Signs of cirrhosis. 5. Aortic atherosclerosis. Coronary artery calcifications. An element of the septal thickening could be due to pulmonary  edema as well, though areas appear more irregular and slightly nodular as described. 6. Small pericardial effusion is unchanged, now associated with trace bilateral pleural effusions. 7. These results will be called to the ordering clinician or representative by the Radiologist Assistant, and communication documented in the PACS or zVision Dashboard.   PFTs 11/06/18: FVC-%Pred-Pre Latest Units: % 41 FEV1-%Pred-Pre Latest Units: % 41 FEV1FVC-%Pred-Pre Latest Units: % 99 TLC % pred Latest Units: % 74 DLCO cor % pred Latest Units: % 56 DLCO unc % pred Latest Units: % 51  TTE 02/22/18:  1. The left ventricle has normal systolic function with an ejection fraction of 60-65%. The cavity size was normal. There is  mildly increased left ventricular wall thickness. Left ventricular diastolic Doppler parameters are consistent with impaired  relaxation Elevated mean left atrial pressure.  2. The right ventricle has normal systolic function. The cavity was mildly enlarged. There is no increase in right ventricular wall thickness.  3. There is systolic flattening of the ventricular septum suggesting elevated RV pressure.  4. Left atrial size was severely dilated.  5. Right atrial size was severely dilated.  6. Diffuse calcification of MV leaflets. Severe thickening of the mitral valve leaflet. Mild mitral valve stenosis.  7. The tricuspid valve is normal in structure.  8. The aortic valve is tricuspid Mild thickening of the aortic valve no stenosis of the aortic valve.  9. Pulmonary hypertension is moderately elevated, PASP 59. 10. Right atrial pressure is estimated at 3 mmHg. )       Anesthesia Quick Evaluation

## 2018-12-17 NOTE — Progress Notes (Signed)
Anesthesia Chart Review: Same day workup  Follows with cardiology for hx of afib/flutter s/p failed cardioversion. Last seen by Dr. Domenic Polite 02/16/18, discussed worsening SOB and fatigue. Echo was done 02/23/18 and Dr. Domenic Polite commented on the result "Results reviewed. LVEF is normal at 60 to 65%. There is diastolic dysfunction and therefore I would check to see how she did with the limited course of Lasix in terms of shortness of breath overall. There is mild mitral stenosis which is unlikely to be causing symptoms, however she does have evidence of pulmonary hypertension which I wonder could be potentially related to underlying chronic lung disease. Please make a referral for her to see Dr. Luan Pulling for formal pulmonary evaluation. If she has felt better on Lasix, please let me know and we can sort out a longer term prescription."  She was initially evaluated by Dr. Luan Pulling for severe COPD and pulm HTN, on 2L continuous O2.  CT scan ordered by Dr. Luan Pulling revealed numerous subcentimeter pulmonary nodules with a question of MAC. She underwent a bronchoscopy with BAL which did not show evidence of MAC. She was referred to Dr. Ina Homes for second opinion of pulm diease. Combination of COPD and medication associated ILD suspected. PFTs with moderate restriction, preserved ratio.  DLCO markedly reduced but corrects for alveolar ventilation. Per Dr. Thompson Caul last note 12/13/18 "CT showing worsening septal thickening and adenopathy. Breathing also getting worse despite stopping flecainide. Patient had wanted to avoid bronch but I think we are out of options. MMRC 2 dyspnea."  IDDMII, last A1c 6.4 on 07/23/18.  Will need DOS labs and eval.  CT chest 12/04/18: IMPRESSION: 1. Bulky mediastinal adenopathy as described. Lymphoproliferative disorder or metastatic process and lymphangitic carcinomatosis is not excluded. Sarcoidosis is considered in the differential based on the presence of adenopathy,  potential interstitial lung disease with signs of nodular septal thickening and fissural thickening. Nodal biopsy may be warranted. 2. New areas of its tree-in-bud opacity and ill-defined nodularity in the inferior right upper lobe and in the right middle lobe. Findings raising the question atypical infection. 3. Multiple pulmonary nodules are not changed. Continued follow-up based on the largest nodule in the right upper lobe is suggested at 18 months following the scan from July of 2020; however, follow-up may occur sooner based on other findings described above it has. 4. Signs of cirrhosis. 5. Aortic atherosclerosis. Coronary artery calcifications. An element of the septal thickening could be due to pulmonary edema as well, though areas appear more irregular and slightly nodular as described. 6. Small pericardial effusion is unchanged, now associated with trace bilateral pleural effusions. 7. These results will be called to the ordering clinician or representative by the Radiologist Assistant, and communication documented in the PACS or zVision Dashboard.   PFTs 11/06/18: FVC-%Pred-Pre Latest Units: % 41  FEV1-%Pred-Pre Latest Units: % 41  FEV1FVC-%Pred-Pre Latest Units: % 99  TLC % pred Latest Units: % 74  DLCO cor % pred Latest Units: % 56  DLCO unc % pred Latest Units: % 51   TTE 02/22/18:  1. The left ventricle has normal systolic function with an ejection fraction of 60-65%. The cavity size was normal. There is mildly increased left ventricular wall thickness. Left ventricular diastolic Doppler parameters are consistent with impaired  relaxation Elevated mean left atrial pressure.  2. The right ventricle has normal systolic function. The cavity was mildly enlarged. There is no increase in right ventricular wall thickness.  3. There is systolic flattening of the ventricular  septum suggesting elevated RV pressure.  4. Left atrial size was severely dilated.  5. Right atrial  size was severely dilated.  6. Diffuse calcification of MV leaflets. Severe thickening of the mitral valve leaflet. Mild mitral valve stenosis.  7. The tricuspid valve is normal in structure.  8. The aortic valve is tricuspid Mild thickening of the aortic valve no stenosis of the aortic valve.  9. Pulmonary hypertension is moderately elevated, PASP 59. 10. Right atrial pressure is estimated at 3 mmHg.   Wynonia Musty [facility] Short Stay Center/Anesthesiology Phone [phone] 12/17/2018 11:15 AM

## 2018-12-17 NOTE — Telephone Encounter (Signed)
Call made to patient, confirmed DOB, made aware of DS recommendations. Voiced understanding. Confirmed Bronch date and arrival times. I inquired as to whether she had any questions. She states "no."   Nothing further needed at this time.

## 2018-12-18 ENCOUNTER — Inpatient Hospital Stay (HOSPITAL_COMMUNITY)
Admission: RE | Admit: 2018-12-18 | Discharge: 2018-12-19 | DRG: 310 | Disposition: A | Discharge status: Home / Self Care | Payer: Medicare HMO | Attending: Internal Medicine | Admitting: Internal Medicine

## 2018-12-18 ENCOUNTER — Encounter (HOSPITAL_COMMUNITY)
Admission: RE | Disposition: A | Discharge status: Home / Self Care | Payer: Self-pay | Source: Home / Self Care | Attending: Internal Medicine

## 2018-12-18 ENCOUNTER — Encounter (HOSPITAL_COMMUNITY): Payer: Self-pay

## 2018-12-18 ENCOUNTER — Other Ambulatory Visit: Payer: Self-pay

## 2018-12-18 DIAGNOSIS — E876 Hypokalemia: Secondary | ICD-10-CM | POA: Diagnosis present

## 2018-12-18 DIAGNOSIS — E1122 Type 2 diabetes mellitus with diabetic chronic kidney disease: Secondary | ICD-10-CM | POA: Diagnosis present

## 2018-12-18 DIAGNOSIS — Z9981 Dependence on supplemental oxygen: Secondary | ICD-10-CM

## 2018-12-18 DIAGNOSIS — E118 Type 2 diabetes mellitus with unspecified complications: Secondary | ICD-10-CM | POA: Diagnosis present

## 2018-12-18 DIAGNOSIS — I050 Rheumatic mitral stenosis: Secondary | ICD-10-CM | POA: Diagnosis present

## 2018-12-18 DIAGNOSIS — F329 Major depressive disorder, single episode, unspecified: Secondary | ICD-10-CM | POA: Diagnosis present

## 2018-12-18 DIAGNOSIS — Z7901 Long term (current) use of anticoagulants: Secondary | ICD-10-CM | POA: Diagnosis not present

## 2018-12-18 DIAGNOSIS — Z880 Allergy status to penicillin: Secondary | ICD-10-CM

## 2018-12-18 DIAGNOSIS — I129 Hypertensive chronic kidney disease with stage 1 through stage 4 chronic kidney disease, or unspecified chronic kidney disease: Secondary | ICD-10-CM | POA: Diagnosis present

## 2018-12-18 DIAGNOSIS — Z20828 Contact with and (suspected) exposure to other viral communicable diseases: Secondary | ICD-10-CM | POA: Diagnosis present

## 2018-12-18 DIAGNOSIS — F419 Anxiety disorder, unspecified: Secondary | ICD-10-CM | POA: Diagnosis present

## 2018-12-18 DIAGNOSIS — R590 Localized enlarged lymph nodes: Secondary | ICD-10-CM | POA: Diagnosis present

## 2018-12-18 DIAGNOSIS — R0902 Hypoxemia: Secondary | ICD-10-CM | POA: Diagnosis present

## 2018-12-18 DIAGNOSIS — R918 Other nonspecific abnormal finding of lung field: Secondary | ICD-10-CM | POA: Diagnosis not present

## 2018-12-18 DIAGNOSIS — J449 Chronic obstructive pulmonary disease, unspecified: Secondary | ICD-10-CM | POA: Diagnosis present

## 2018-12-18 DIAGNOSIS — K219 Gastro-esophageal reflux disease without esophagitis: Secondary | ICD-10-CM | POA: Diagnosis present

## 2018-12-18 DIAGNOSIS — E119 Type 2 diabetes mellitus without complications: Secondary | ICD-10-CM | POA: Diagnosis not present

## 2018-12-18 DIAGNOSIS — N1831 Chronic kidney disease, stage 3a: Secondary | ICD-10-CM | POA: Diagnosis present

## 2018-12-18 DIAGNOSIS — D649 Anemia, unspecified: Secondary | ICD-10-CM | POA: Diagnosis present

## 2018-12-18 DIAGNOSIS — R911 Solitary pulmonary nodule: Secondary | ICD-10-CM | POA: Diagnosis present

## 2018-12-18 DIAGNOSIS — I10 Essential (primary) hypertension: Secondary | ICD-10-CM | POA: Diagnosis not present

## 2018-12-18 DIAGNOSIS — R599 Enlarged lymph nodes, unspecified: Secondary | ICD-10-CM | POA: Diagnosis not present

## 2018-12-18 DIAGNOSIS — J309 Allergic rhinitis, unspecified: Secondary | ICD-10-CM | POA: Diagnosis present

## 2018-12-18 DIAGNOSIS — Z833 Family history of diabetes mellitus: Secondary | ICD-10-CM

## 2018-12-18 DIAGNOSIS — Z79899 Other long term (current) drug therapy: Secondary | ICD-10-CM

## 2018-12-18 DIAGNOSIS — G2581 Restless legs syndrome: Secondary | ICD-10-CM | POA: Diagnosis present

## 2018-12-18 DIAGNOSIS — Z87891 Personal history of nicotine dependence: Secondary | ICD-10-CM | POA: Diagnosis not present

## 2018-12-18 DIAGNOSIS — R9389 Abnormal findings on diagnostic imaging of other specified body structures: Secondary | ICD-10-CM

## 2018-12-18 DIAGNOSIS — I4519 Other right bundle-branch block: Secondary | ICD-10-CM | POA: Diagnosis not present

## 2018-12-18 DIAGNOSIS — R0789 Other chest pain: Secondary | ICD-10-CM | POA: Diagnosis present

## 2018-12-18 DIAGNOSIS — J441 Chronic obstructive pulmonary disease with (acute) exacerbation: Secondary | ICD-10-CM | POA: Diagnosis not present

## 2018-12-18 DIAGNOSIS — D631 Anemia in chronic kidney disease: Secondary | ICD-10-CM | POA: Diagnosis present

## 2018-12-18 DIAGNOSIS — E782 Mixed hyperlipidemia: Secondary | ICD-10-CM | POA: Diagnosis present

## 2018-12-18 DIAGNOSIS — I480 Paroxysmal atrial fibrillation: Secondary | ICD-10-CM | POA: Diagnosis present

## 2018-12-18 DIAGNOSIS — Z8249 Family history of ischemic heart disease and other diseases of the circulatory system: Secondary | ICD-10-CM | POA: Diagnosis not present

## 2018-12-18 DIAGNOSIS — Z8542 Personal history of malignant neoplasm of other parts of uterus: Secondary | ICD-10-CM | POA: Diagnosis not present

## 2018-12-18 DIAGNOSIS — I4891 Unspecified atrial fibrillation: Secondary | ICD-10-CM | POA: Diagnosis present

## 2018-12-18 DIAGNOSIS — Z794 Long term (current) use of insulin: Secondary | ICD-10-CM

## 2018-12-18 HISTORY — DX: Other specified postprocedural states: Z98.890

## 2018-12-18 HISTORY — DX: Pneumonia, unspecified organism: J18.9

## 2018-12-18 HISTORY — DX: Anemia, unspecified: D64.9

## 2018-12-18 HISTORY — DX: Dyspnea, unspecified: R06.00

## 2018-12-18 HISTORY — DX: Other specified postprocedural states: R11.2

## 2018-12-18 LAB — COMPREHENSIVE METABOLIC PANEL
ALT: 21 U/L (ref 0–44)
AST: 29 U/L (ref 15–41)
Albumin: 3.9 g/dL (ref 3.5–5.0)
Alkaline Phosphatase: 66 U/L (ref 38–126)
Anion gap: 13 (ref 5–15)
BUN: 17 mg/dL (ref 8–23)
CO2: 26 mmol/L (ref 22–32)
Calcium: 9.3 mg/dL (ref 8.9–10.3)
Chloride: 102 mmol/L (ref 98–111)
Creatinine, Ser: 1.12 mg/dL — ABNORMAL HIGH (ref 0.44–1.00)
GFR calc Af Amer: 56 mL/min — ABNORMAL LOW (ref >60)
GFR calc non Af Amer: 48 mL/min — ABNORMAL LOW (ref >60)
Glucose, Bld: 187 mg/dL — ABNORMAL HIGH (ref 70–99)
Potassium: 3.3 mmol/L — ABNORMAL LOW (ref 3.5–5.1)
Sodium: 141 mmol/L (ref 135–145)
Total Bilirubin: 1.1 mg/dL (ref 0.3–1.2)
Total Protein: 7.1 g/dL (ref 6.5–8.1)

## 2018-12-18 LAB — GLUCOSE, CAPILLARY
Glucose-Capillary: 141 mg/dL — ABNORMAL HIGH (ref 70–99)
Glucose-Capillary: 180 mg/dL — ABNORMAL HIGH (ref 70–99)
Glucose-Capillary: 230 mg/dL — ABNORMAL HIGH (ref 70–99)

## 2018-12-18 LAB — APTT: aPTT: 29 seconds (ref 24–36)

## 2018-12-18 LAB — CBC
HCT: 31.9 % — ABNORMAL LOW (ref 36.0–46.0)
Hemoglobin: 10.3 g/dL — ABNORMAL LOW (ref 12.0–15.0)
MCH: 30.4 pg (ref 26.0–34.0)
MCHC: 32.3 g/dL (ref 30.0–36.0)
MCV: 94.1 fL (ref 80.0–100.0)
Platelets: 180 10*3/uL (ref 150–400)
RBC: 3.39 MIL/uL — ABNORMAL LOW (ref 3.87–5.11)
RDW: 14.4 % (ref 11.5–15.5)
WBC: 7 10*3/uL (ref 4.0–10.5)
nRBC: 0 % (ref 0.0–0.2)

## 2018-12-18 LAB — PROTIME-INR
INR: 1.3 — ABNORMAL HIGH (ref 0.8–1.2)
Prothrombin Time: 15.8 seconds — ABNORMAL HIGH (ref 11.4–15.2)

## 2018-12-18 LAB — HEMOGLOBIN A1C
Hgb A1c MFr Bld: 7.2 % — ABNORMAL HIGH (ref 4.8–5.6)
Mean Plasma Glucose: 159.94 mg/dL

## 2018-12-18 LAB — MAGNESIUM: Magnesium: 1.5 mg/dL — ABNORMAL LOW (ref 1.7–2.4)

## 2018-12-18 SURGERY — BRONCHOSCOPY, WITH EBUS
Anesthesia: General

## 2018-12-18 MED ORDER — INSULIN ASPART 100 UNIT/ML ~~LOC~~ SOLN: 0.0000 [IU] | Freq: Three times a day (TID) | SUBCUTANEOUS | Status: DC

## 2018-12-18 MED ORDER — LACTATED RINGERS IV SOLN
INTRAVENOUS | Status: DC
  Administered 2018-12-18: 12:00:00 via INTRAVENOUS

## 2018-12-18 MED ORDER — INSULIN DETEMIR 100 UNIT/ML ~~LOC~~ SOLN
25.0000 [IU] | Freq: Every day | SUBCUTANEOUS | Status: DC
  Administered 2018-12-19: 25 [IU] via SUBCUTANEOUS
  Filled 2018-12-18: qty 0.25

## 2018-12-18 MED ORDER — ROCURONIUM BROMIDE 10 MG/ML (PF) SYRINGE
PREFILLED_SYRINGE | INTRAVENOUS | Status: AC
  Filled 2018-12-18: qty 10

## 2018-12-18 MED ORDER — POTASSIUM CHLORIDE CRYS ER 20 MEQ PO TBCR
30.0000 meq | EXTENDED_RELEASE_TABLET | ORAL | Status: AC
  Administered 2018-12-18: 30 meq via ORAL
  Filled 2018-12-18: qty 1

## 2018-12-18 MED ORDER — LIDOCAINE 2% (20 MG/ML) 5 ML SYRINGE
INTRAMUSCULAR | Status: AC
  Filled 2018-12-18: qty 5

## 2018-12-18 MED ORDER — LISINOPRIL 10 MG PO TABS
10.0000 mg | ORAL_TABLET | Freq: Every day | ORAL | Status: DC
  Administered 2018-12-19: 10 mg via ORAL
  Filled 2018-12-18: qty 1

## 2018-12-18 MED ORDER — LACTATED RINGERS IV SOLN
INTRAVENOUS | Status: AC | PRN
  Administered 2018-12-18: 12:00:00 via INTRAVENOUS

## 2018-12-18 MED ORDER — POTASSIUM CHLORIDE CRYS ER 20 MEQ PO TBCR: 40.0000 meq | EXTENDED_RELEASE_TABLET | Freq: Once | ORAL | Status: DC

## 2018-12-18 MED ORDER — DILTIAZEM HCL ER COATED BEADS 180 MG PO CP24
180.0000 mg | ORAL_CAPSULE | Freq: Every day | ORAL | Status: DC
  Administered 2018-12-19: 180 mg via ORAL
  Filled 2018-12-18: qty 1

## 2018-12-18 MED ORDER — INSULIN DETEMIR 100 UNIT/ML ~~LOC~~ SOLN
50.0000 [IU] | Freq: Every day | SUBCUTANEOUS | Status: DC
  Filled 2018-12-18: qty 0.5

## 2018-12-18 MED ORDER — SCOPOLAMINE 1 MG/3DAYS TD PT72
MEDICATED_PATCH | TRANSDERMAL | Status: AC
  Filled 2018-12-18: qty 1

## 2018-12-18 MED ORDER — ONDANSETRON HCL 4 MG/2ML IJ SOLN
INTRAMUSCULAR | Status: AC
  Filled 2018-12-18: qty 2

## 2018-12-18 MED ORDER — ATORVASTATIN CALCIUM 40 MG PO TABS
40.0000 mg | ORAL_TABLET | Freq: Every day | ORAL | Status: DC
  Administered 2018-12-18 – 2018-12-19 (×2): 40 mg via ORAL
  Filled 2018-12-18 (×2): qty 1

## 2018-12-18 MED ORDER — PANTOPRAZOLE SODIUM 40 MG PO TBEC
40.0000 mg | DELAYED_RELEASE_TABLET | Freq: Every day | ORAL | Status: DC
  Administered 2018-12-19: 40 mg via ORAL
  Filled 2018-12-18: qty 1

## 2018-12-18 MED ORDER — ATENOLOL 25 MG PO TABS
50.0000 mg | ORAL_TABLET | Freq: Every day | ORAL | Status: DC
  Administered 2018-12-19: 50 mg via ORAL
  Filled 2018-12-18: qty 2
  Filled 2018-12-18: qty 1

## 2018-12-18 MED ORDER — DEXAMETHASONE SODIUM PHOSPHATE 10 MG/ML IJ SOLN
INTRAMUSCULAR | Status: AC
  Filled 2018-12-18: qty 1

## 2018-12-18 MED ORDER — DILTIAZEM HCL-DEXTROSE 125-5 MG/125ML-% IV SOLN (PREMIX)
5.0000 mg/h | INTRAVENOUS | Status: DC
  Administered 2018-12-18: 5 mg/h via INTRAVENOUS
  Administered 2018-12-19: 7.5 mg/h via INTRAVENOUS
  Filled 2018-12-18 (×3): qty 125

## 2018-12-18 MED ORDER — INSULIN DETEMIR 100 UNIT/ML ~~LOC~~ SOLN
30.0000 [IU] | Freq: Every day | SUBCUTANEOUS | Status: DC
  Filled 2018-12-18: qty 0.3

## 2018-12-18 MED ORDER — PROPOFOL 10 MG/ML IV BOLUS
INTRAVENOUS | Status: AC
  Filled 2018-12-18: qty 20

## 2018-12-18 MED ORDER — SCOPOLAMINE 1 MG/3DAYS TD PT72
1.0000 | MEDICATED_PATCH | TRANSDERMAL | Status: DC
  Administered 2018-12-18: 1.5 mg via TRANSDERMAL

## 2018-12-18 MED ORDER — MAGNESIUM SULFATE 2 GM/50ML IV SOLN
2.0000 g | Freq: Once | INTRAVENOUS | Status: AC
  Administered 2018-12-18: 2 g via INTRAVENOUS
  Filled 2018-12-18: qty 50

## 2018-12-18 MED ORDER — IPRATROPIUM-ALBUTEROL 0.5-2.5 (3) MG/3ML IN SOLN: 3.0000 mL | Freq: Four times a day (QID) | RESPIRATORY_TRACT | Status: DC | PRN

## 2018-12-18 MED ORDER — DIPHENHYDRAMINE HCL 25 MG PO CAPS
25.0000 mg | ORAL_CAPSULE | Freq: Every day | ORAL | Status: DC
  Administered 2018-12-18: 25 mg via ORAL
  Filled 2018-12-18: qty 1

## 2018-12-18 MED ORDER — ACETAMINOPHEN 325 MG PO TABS: 650.0000 mg | ORAL_TABLET | Freq: Four times a day (QID) | ORAL | Status: DC | PRN

## 2018-12-18 MED ORDER — MIDAZOLAM HCL 2 MG/2ML IJ SOLN
INTRAMUSCULAR | Status: AC
  Filled 2018-12-18: qty 2

## 2018-12-18 MED ORDER — SERTRALINE HCL 100 MG PO TABS
100.0000 mg | ORAL_TABLET | Freq: Every day | ORAL | Status: DC
  Administered 2018-12-19: 100 mg via ORAL
  Filled 2018-12-18: qty 1

## 2018-12-18 MED ORDER — 0.9 % SODIUM CHLORIDE (POUR BTL) OPTIME: TOPICAL | Status: DC | PRN

## 2018-12-18 MED ORDER — METOPROLOL TARTRATE 50 MG PO TABS
ORAL_TABLET | ORAL | Status: AC
  Administered 2018-12-18: 50 mg via ORAL
  Filled 2018-12-18: qty 1

## 2018-12-18 MED ORDER — DIPHENHYDRAMINE HCL 25 MG PO CAPS
25.0000 mg | ORAL_CAPSULE | Freq: Four times a day (QID) | ORAL | Status: DC | PRN
  Filled 2018-12-18: qty 1

## 2018-12-18 MED ORDER — METOPROLOL TARTRATE 50 MG PO TABS
50.0000 mg | ORAL_TABLET | Freq: Once | ORAL | Status: AC
  Administered 2018-12-18: 13:00:00 50 mg via ORAL

## 2018-12-18 MED ORDER — ACETAMINOPHEN 650 MG RE SUPP: 650.0000 mg | Freq: Four times a day (QID) | RECTAL | Status: DC | PRN

## 2018-12-18 MED ORDER — TORSEMIDE 20 MG PO TABS
20.0000 mg | ORAL_TABLET | Freq: Every day | ORAL | Status: DC
  Administered 2018-12-18 – 2018-12-19 (×2): 20 mg via ORAL
  Filled 2018-12-18 (×3): qty 1

## 2018-12-18 MED ORDER — SODIUM CHLORIDE 0.9% FLUSH
3.0000 mL | Freq: Two times a day (BID) | INTRAVENOUS | Status: DC
  Administered 2018-12-18 – 2018-12-19 (×2): 3 mL via INTRAVENOUS

## 2018-12-18 MED ORDER — INSULIN ASPART 100 UNIT/ML ~~LOC~~ SOLN
0.0000 [IU] | Freq: Three times a day (TID) | SUBCUTANEOUS | Status: DC
  Administered 2018-12-19 (×2): 3 [IU] via SUBCUTANEOUS

## 2018-12-18 MED ORDER — FENTANYL CITRATE (PF) 250 MCG/5ML IJ SOLN
INTRAMUSCULAR | Status: AC
  Filled 2018-12-18: qty 5

## 2018-12-18 MED ORDER — FLUTICASONE FUROATE-VILANTEROL 100-25 MCG/INH IN AEPB
1.0000 | INHALATION_SPRAY | Freq: Every day | RESPIRATORY_TRACT | Status: DC
  Administered 2018-12-19: 1 via RESPIRATORY_TRACT
  Filled 2018-12-18: qty 28

## 2018-12-18 MED ORDER — PHENYLEPHRINE 40 MCG/ML (10ML) SYRINGE FOR IV PUSH (FOR BLOOD PRESSURE SUPPORT)
PREFILLED_SYRINGE | INTRAVENOUS | Status: AC
  Filled 2018-12-18: qty 10

## 2018-12-18 NOTE — Progress Notes (Signed)
Pt transferred to 4E-19 from short stay via stretcher. Pt walked to bed. Pt given CHG bath. Tele applied, CCMD notified. Pt oriented to call bell, bed and room. Call bell within reach. VSS. Will continue to monitor.  Amanda Cockayne, RN

## 2018-12-18 NOTE — H&P (Addendum)
History and Physical    Ellen Jordan UUV:[id number] DOB: 02-Nov-1945 DOA: 12/18/2018  Referring MD/NP/PA: Ina Homes, MD PCP: [name], MD  Patient coming from: From preop holding  Chief Complaint: Dyspnea on exertion  I have personally briefly reviewed patient's old medical records in [place]   HPI: Ellen Jordan is a 73 y.o. female with medical history significant of essential hypertension, oxygen dependent on 2 L, paroxysmal atrial fibrillation on Eliquis, pulmonary artery hypertension, chronic kidney disease stage III , diabetes mellitus type 2, depression, remote tobacco history, and RLS.  She presented for further evaluation of pulmonary artery hypertension and mediastinal lymphadenopathy with bronchoscopy.  Patient has had progressive dyspnea on exertion over the last year for which she has been on oxygen for at least 8 or 9 months now.  She has been being followed by Dr. Luan Pulling in [place] who ordered chest CT on 7/10, where there was numerous subcentimeter pulmonary nodules.  Previous bronchoscopy with BAL did not show any evidence of MAC.  Patient had undergone pulmonary function studies noting moderate restriction with preserved ratio.  There is suspicion for combination of COPD and medication induced interstitial lung disease possibly related to flecainide.  She reports that this medication was stopped approximately 1 month ago.  Recent repeat CT obtained from 11/24 revealed both the mediastinal adenopathy concerning for lymphoproliferative disorder or metastatic process.  While in preop holding patient was noted to go into atrial fibrillation with heart rates into the 150s with blood pressures maintained.  She does not ever know when she goes into atrial fibrillation, but reported taking all of her morning medications.  Patient was given 50 mg of metoprolol p.o. without improvement in symptoms.  Reports some mild chest discomfort underneath her left breast since symptoms  started.  Labs were significant for hemoglobin 10.3 and potassium 3.3.  ED Course: As seen above  Review of Systems  Constitutional: Negative for fever and weight loss.  HENT: Negative for ear discharge and nosebleeds.   Eyes: Negative for pain and discharge.  Respiratory: Positive for shortness of breath. Negative for cough.   Cardiovascular: Positive for chest pain. Negative for palpitations and leg swelling.  Gastrointestinal: Negative for abdominal pain and diarrhea.  Genitourinary: Negative for dysuria and hematuria.  Musculoskeletal: Negative for joint pain and myalgias.  Skin: Negative for itching and rash.  Neurological: Negative for focal weakness and loss of consciousness.  Psychiatric/Behavioral: Negative for hallucinations. The patient is not nervous/anxious.     Past Medical History:  Diagnosis Date   Allergic rhinitis    Allergy    Anxiety    Atrial fibrillation (HCC)    Carpal tunnel syndrome    left   Cataract    Depression    Diabetic neuropathy (HCC)    Dyspnea    Essential hypertension    GERD (gastroesophageal reflux disease)    Mixed hyperlipidemia    PAF (paroxysmal atrial fibrillation) ([place])    a. s/p TEE cardioversion March 2017. b. Recurrence by event monitor 04/2016.   Pneumonia    PONV (postoperative nausea and vomiting)    after previous bronchoscopy   RBBB    Restless leg syndrome    Type 2 diabetes mellitus ([place])    Uterine cancer ([employer])     Past Surgical History:  Procedure Laterality Date   ABDOMINAL HYSTERECTOMY     uterine cancer   BACK SURGERY     CARDIOVERSION N/A 03/11/2015   Procedure: CARDIOVERSION;  Surgeon: Houston Siren  Hazel Sams, MD;  Location: [facility];  Service: Cardiovascular;  Laterality: N/A;   CHOLECYSTECTOMY     EYE SURGERY     cataracts   FLEXIBLE BRONCHOSCOPY N/A 07/30/2018   Procedure: FLEXIBLE BRONCHOSCOPY;  Surgeon: Sinda Du, MD;  [facility];  Service: Cardiopulmonary;   Laterality: N/A;   SPINE SURGERY     TEE WITHOUT CARDIOVERSION N/A 03/11/2015   Procedure: TRANSESOPHAGEAL ECHOCARDIOGRAM (TEE);  Surgeon: [name], MD;  Location: [facility] ENDOSCOPY;  Service: Cardiovascular;  Laterality: N/A;     reports that she quit smoking about 13 years ago. Her smoking use included cigarettes. She started smoking about 57 years ago. She has a 50.00 pack-year smoking history. She quit smokeless tobacco use about 13 years ago. She reports that she does not drink alcohol or use drugs.  Allergies  Allergen Reactions   Penicillins Hives    Has patient had a PCN reaction causing immediate rash, facial/tongue/throat swelling, SOB or lightheadedness with hypotension: Yes Has patient had a PCN reaction causing severe rash involving mucus membranes or skin necrosis: No Has patient had a PCN reaction that required hospitalization No Has patient had a PCN reaction occurring within the last 10 years: No If all of the above answers are "NO", then may proceed with Cephalosporin use.     Family History  Problem Relation Age of Onset   Heart disease Mother 57   Hyperlipidemia Mother    Diabetes Brother    COPD Father 32       emphysema   Hypertension Father    Hyperlipidemia Father    Diabetes Daughter    AAA (abdominal aortic aneurysm) Son    Diabetes Brother    Diabetes Brother    Diabetes Brother     Prior to Admission medications   Medication Sig Start Date End Date Taking? Authorizing Provider  albuterol (PROVENTIL HFA;VENTOLIN HFA) 108 (90 Base) MCG/ACT inhaler Inhale 2 puffs into the lungs every 6 (six) hours as needed for wheezing or shortness of breath. 06/07/17  Yes Hagler, Apolonio Schneiders, MD  atenolol (TENORMIN) 50 MG tablet TAKE 1 TABLET EVERY DAY 09/04/17  Yes Caren Macadam, MD  atorvastatin (LIPITOR) 40 MG tablet TAKE 1 TABLET EVERY DAY 07/16/18  Yes Satira Sark, MD  b complex vitamins tablet Take 1 tablet by mouth daily.   Yes [provider]  Biotin 5000 MCG CAPS Take 1 capsule by mouth daily.   Yes [provider]  Calcium-Phosphorus-Vitamin D (CALCIUM GUMMIES PO) Take 1 tablet by mouth daily. [phone]   Yes [provider]  chlorpheniramine (EQ CHLORTABS) 4 MG tablet Take 4 mg by mouth 2 (two) times daily as needed for allergies.   Yes [provider]  cholecalciferol (VITAMIN D) 1000 units tablet Take 1,000 Units by mouth daily.   Yes [provider]  diltiazem (CARDIZEM CD) 180 MG 24 hr capsule Take 1 capsule (180 mg total) by mouth daily. 01/04/18  Yes Satira Sark, MD  diphenhydrAMINE (BENADRYL) 25 MG tablet Take 25 mg by mouth daily.   Yes [provider]  ELIQUIS 5 MG TABS tablet TAKE 1 TABLET TWICE DAILY 09/05/18  Yes Satira Sark, MD  fluticasone furoate-vilanterol (BREO ELLIPTA) 100-25 MCG/INH AEPB Inhale 1 puff into the lungs daily. 09/12/18  Yes [name], MD  insulin aspart (NOVOLOG) 100 UNIT/ML injection Inject 10-16 Units into the skin 3 (three) times daily with meals. 05/17/18  Yes Nida, Marella Chimes, MD  insulin detemir (  LEVEMIR) 100 UNIT/ML injection Inject 0.5 mLs (50 Units total) into the skin at bedtime. 07/21/16  Yes Nida, Marella Chimes, MD  ipratropium-albuterol (DUONEB) 0.5-2.5 (3) MG/3ML SOLN Take 3 mLs by nebulization every 6 (six) hours as needed (wheezing/ shortness of breath).   Yes [provider]  liraglutide (VICTOZA) 18 MG/3ML SOPN Inject 0.3 mLs (1.8 mg total) into the skin daily. 08/15/18  Yes Nida, Marella Chimes, MD  lisinopril (PRINIVIL,ZESTRIL) 20 MG tablet TAKE 1 TABLET (20 MG TOTAL) BY MOUTH DAILY. Patient taking differently: Take 10 mg by mouth daily. Patient is currently taking a 1/2 tablet due to low BP 09/04/17  Yes Hagler, Apolonio Schneiders, MD  Misc Natural Products (ESTROVEN + ENERGY MAX STRENGTH) TABS Take 1 tablet by mouth daily.   Yes [provider]  Multiple Vitamin (MULTIVITAMIN) tablet Take 1 tablet by  mouth daily.   Yes [provider]  Omega-3 Fatty Acids (FISH OIL) 1200 MG CAPS Take 1 capsule by mouth daily.   Yes [provider]  omeprazole (PRILOSEC) 20 MG capsule TAKE 1 CAPSULE EVERY DAY 09/05/18  Yes Satira Sark, MD  sertraline (ZOLOFT) 100 MG tablet TAKE 1 TABLET (100 MG TOTAL) BY MOUTH DAILY. 09/04/17  Yes Caren Macadam, MD  torsemide (DEMADEX) 20 MG tablet Take 20 mg by mouth daily. 04/03/18  Yes [provider]  vitamin E (VITAMIN E) 400 UNIT capsule Take 400 Units by mouth daily.   Yes [provider]  ACCU-CHEK FASTCLIX LANCETS MISC CHECK BLOOD GLUCOSE FOUR TIMES DAILY 03/07/16   Cassandria Anger, MD  ACCU-CHEK GUIDE test strip USE AS INSTRUCTED FOUR TIMES DAILY 11/06/18   Cassandria Anger, MD  ACCU-CHEK SMARTVIEW test strip CHECK BLOOD GLUCOSE FOUR TIMES DAILY 03/07/16   Cassandria Anger, MD  Alcohol Swabs (B-D SINGLE USE SWABS REGULAR) PADS USE FOUR TIMES DAILY 12/04/18   Nida, Marella Chimes, MD  Blood Glucose Monitoring Suppl (ACCU-CHEK GUIDE) w/Device KIT 1 each by Does not apply route 4 (four) times daily. 02/21/17   Cassandria Anger, MD  flecainide (TAMBOCOR) 100 MG tablet TAKE 1 TABLET TWICE DAILY ( DOSE INCREASE ) 11/05/18   Satira Sark, MD  glucose blood (ACCU-CHEK GUIDE) test strip Use as instructed 4 x daily 02/21/17   Cassandria Anger, MD    Physical Exam:  Constitutional: Elderly female appears to be in no acute distress NAD, calm, comfortable Vitals:   12/18/18 1104 12/18/18 1240  BP: 138/84 140/72  Pulse: (!) 156   Resp: 20   Temp: 98.5 F (36.9 C)   TempSrc: Oral   SpO2: 98%   Weight: 80 kg   Height: _0  (1.6 m)    Eyes: PERRL, lids and conjunctivae normal ENMT: Mucous membranes are moist. Posterior pharynx clear of any exudate or lesions. Neck: normal, supple, no masses, no thyromegaly Respiratory: Positive crackles appreciated both lung fields.  Patient currently on 2 L nasal  cannula oxygen with O2 saturations maintained. Cardiovascular: Irregular irregular, no murmurs / rubs / gallops. No extremity edema. 2+ pedal pulses. No carotid bruits.  Abdomen: no tenderness, no masses palpated. No hepatosplenomegaly. Bowel sounds positive.  Musculoskeletal: no clubbing / cyanosis. No joint deformity upper and lower extremities. Good ROM, no contractures. Normal muscle tone.  Skin: no rashes, lesions, ulcers. No induration Neurologic: CN 2-12 grossly intact. Sensation intact, DTR normal. Strength 5/5 in all 4.  Psychiatric: Normal judgment and insight. Alert and oriented x 3. Normal mood.     Labs  on Admission: I have personally reviewed following labs and imaging studies  CBC: Recent Labs  Lab 12/18/18 1145  WBC 7.0  HGB 10.3*  HCT 31.9*  MCV 94.1  PLT 222   Basic Metabolic Panel: Recent Labs  Lab 12/18/18 1145  NA 141  K 3.3*  CL 102  CO2 26  GLUCOSE 187*  BUN 17  CREATININE 1.12*  CALCIUM 9.3   GFR: Estimated Creatinine Clearance: 43.4 mL/min (A) (by C-G formula based on SCr of 1.12 mg/dL (H)). Liver Function Tests: Recent Labs  Lab 12/18/18 1145  AST 29  ALT 21  ALKPHOS 66  BILITOT 1.1  PROT 7.1  ALBUMIN 3.9   No results for input(s): LIPASE, AMYLASE in the last 168 hours. No results for input(s): AMMONIA in the last 168 hours. Coagulation Profile: Recent Labs  Lab 12/18/18 1145  INR 1.3*   Cardiac Enzymes: No results for input(s): CKTOTAL, CKMB, CKMBINDEX, TROPONINI in the last 168 hours. BNP (last 3 results) Recent Labs    09/12/18 1201  PROBNP 51.0   HbA1C: No results for input(s): HGBA1C in the last 72 hours. CBG: No results for input(s): GLUCAP in the last 168 hours. Lipid Profile: No results for input(s): CHOL, HDL, LDLCALC, TRIG, CHOLHDL, LDLDIRECT in the last 72 hours. Thyroid Function Tests: No results for input(s): TSH, T4TOTAL, FREET4, T3FREE, THYROIDAB in the last 72 hours. Anemia Panel: No results for  input(s): VITAMINB12, FOLATE, FERRITIN, TIBC, IRON, RETICCTPCT in the last 72 hours. Urine analysis: No results found for: COLORURINE, APPEARANCEUR, LABSPEC, PHURINE, GLUCOSEU, HGBUR, BILIRUBINUR, KETONESUR, PROTEINUR, UROBILINOGEN, NITRITE, LEUKOCYTESUR Sepsis Labs: Recent Results (from the past 240 hour(s))  SARS CORONAVIRUS 2 (TAT 6-24 HRS) Nasopharyngeal Nasopharyngeal Swab     Status: None   Collection Time: 12/14/18 12:47 PM   Specimen: Nasopharyngeal Swab  Result Value Ref Range Status   SARS Coronavirus 2 NEGATIVE NEGATIVE Final    Comment: (NOTE) SARS-CoV-2 target nucleic acids are NOT DETECTED. The SARS-CoV-2 RNA is generally detectable in upper and lower respiratory specimens during the acute phase of infection. Negative results do not preclude SARS-CoV-2 infection, do not rule out co-infections with other pathogens, and should not be used as the sole basis for treatment or other patient management decisions. Negative results must be combined with clinical observations, patient history, and epidemiological information. The expected result is Negative. Fact Sheet for Patients: SugarRoll.be Fact Sheet for Healthcare Providers: https://www.woods-mathews.com/ This test is not yet approved or cleared by the [place] FDA and  has been authorized for detection and/or diagnosis of SARS-CoV-2 by FDA under an Emergency Use Authorization (EUA). This EUA will remain  in effect (meaning this test can be used) for the duration of the COVID-19 declaration under Section 56 4(b)(1) of the Act, 21 U.S.C. section 360bbb-3(b)(1), unless the authorization is terminated or revoked sooner. Performed at [facility], [place] [address] [place], [place] [address]      Radiological Exams on Admission: No results found.  EKG: Independently reviewed.  Atrial fibrillation at 120 bpm  Assessment/Plan Atrial fibrillation with RVR on chronic  anticoagulation: Acute on Chronic. Patient found in afib in 150s. Normally on Eliquis, but on hold since August  -Admit to a progressive bed -Diltiazem drip  -Goal potassium 4 and magnesium 2 -Continue holding Eliquis for procedure  Mediastinal lymphadenopathy and pulmonary nodule: Acute.  Recent CT of the chest from 11/24.  Plan for bronchoscopy in a.m. -Per PCCM  COPD, without acute exacerbation -Continue home inhaler and  duo nebs as needed for shortness of breath  Essential hypertension: Home blood pressure medications include atenolol 50 mg daily, lisinopril 20 mg daily, and Toresemide 20 mg daily -Continue current home regimen  Hypokalemia: Acute.  On admission potassium 3.3.  Patient was given 40 mEq of potassium. -Give additional 30 mEq of potassium chloride -Continue to monitor and replace as needed   Diabetes mellitus type 2: Last hemoglobin A1c 6.4 in 07/2018.  Patient normally takes 50 units nightly of Levemir. -Hypoglycemic protocols -Hold Victoza -Decreased Levemir by 50% -CBGs with meals with moderate SSI  Normocytic anemia: Chronic.  Hemoglobin 10.3 which appears near patient's baseline. -Recheck CBC in a.m.  Chronic kidney disease stage III: Stable  Skin lesion -Follow-up with dermatology in outpatient setting  DVT prophylaxis: SCDs  Code Status: Full Family Communication: Discussed plan of care with the patient's husband present at bedside Disposition Plan: Likely discharge home once medically stable Consults called: PCCM Admission status: Observation  Norval Morton MD [facility] Pager [phone]   If 7PM-7AM, please contact night-coverage [email or web address] Password [email or web address]  12/18/2018, 12:45 PM

## 2018-12-18 NOTE — Progress Notes (Addendum)
Pulmonary Interim Note  S: called to pre-op for afib/RVR.  Refractory to esmolol push.  Patient has had this issue in past, thinks she took her diltiazem this AM.  O: Bps good Perfusing well Scattered wheezing on lungs exam Ext warm Pale with eczema similar to prior  A: # Afib/RVR, recurrent issue for patient, denies anxiety or pain, she is not sure if she missed her AM dose of diltiazem # O2 dependent ILD with Abnormal CT with progressive changes with ?sarcoid or opportunistic infection # IDDM, took half dose insulin yesterday due to npo status  P: -Magnesium 2g IV x 1 -Metoprolol 11m PO x 1 -May will need diltiazem drip if does not break soon -Have asked [facility] help in admitting patient, much appreciated [facility] -Fine to eat from my standpoint, will need -NPO at midnight and will bronch tomorrow afternoon if HR better, can resume AC after that -I called and updated husband  DErskine EmeryMD PCCM

## 2018-12-18 NOTE — Interval H&P Note (Signed)
History and Physical Interval Note:  12/18/2018 12:06 PM  Ellen Jordan  has presented today for surgery, with the diagnosis of ADENOPATHY.  The various methods of treatment have been discussed with the patient and family. After consideration of risks, benefits and other options for treatment, the patient has consented to  Procedure(s): Xenia (N/A) as a surgical intervention.  The patient's history has been reviewed, patient examined, no change in status, stable for surgery.  I have reviewed the patient's chart and labs.  Questions were answered to the patient's satisfaction.     [name]

## 2018-12-18 NOTE — Progress Notes (Signed)
Patient does not fall within anesthesia parameters of having a chest x-ray.    Need clarification from Dr. Tamala Julian.   Per Dr. Tamala Julian, do not need chest x-ray.

## 2018-12-19 ENCOUNTER — Encounter (HOSPITAL_COMMUNITY)
Admission: RE | Disposition: A | Discharge status: Home / Self Care | Payer: Self-pay | Source: Home / Self Care | Attending: Internal Medicine

## 2018-12-19 ENCOUNTER — Telehealth: Payer: Self-pay

## 2018-12-19 ENCOUNTER — Inpatient Hospital Stay (HOSPITAL_COMMUNITY): Payer: Medicare HMO | Admitting: Anesthesiology

## 2018-12-19 ENCOUNTER — Encounter (HOSPITAL_COMMUNITY): Payer: Self-pay | Admitting: Orthopedic Surgery

## 2018-12-19 DIAGNOSIS — R0902 Hypoxemia: Secondary | ICD-10-CM | POA: Diagnosis present

## 2018-12-19 DIAGNOSIS — D649 Anemia, unspecified: Secondary | ICD-10-CM | POA: Diagnosis not present

## 2018-12-19 DIAGNOSIS — J309 Allergic rhinitis, unspecified: Secondary | ICD-10-CM | POA: Diagnosis present

## 2018-12-19 DIAGNOSIS — F419 Anxiety disorder, unspecified: Secondary | ICD-10-CM | POA: Diagnosis present

## 2018-12-19 DIAGNOSIS — Z79899 Other long term (current) drug therapy: Secondary | ICD-10-CM | POA: Diagnosis not present

## 2018-12-19 DIAGNOSIS — I4891 Unspecified atrial fibrillation: Secondary | ICD-10-CM | POA: Diagnosis not present

## 2018-12-19 DIAGNOSIS — I050 Rheumatic mitral stenosis: Secondary | ICD-10-CM | POA: Diagnosis present

## 2018-12-19 DIAGNOSIS — K219 Gastro-esophageal reflux disease without esophagitis: Secondary | ICD-10-CM | POA: Diagnosis present

## 2018-12-19 DIAGNOSIS — I129 Hypertensive chronic kidney disease with stage 1 through stage 4 chronic kidney disease, or unspecified chronic kidney disease: Secondary | ICD-10-CM | POA: Diagnosis present

## 2018-12-19 DIAGNOSIS — J441 Chronic obstructive pulmonary disease with (acute) exacerbation: Secondary | ICD-10-CM | POA: Diagnosis not present

## 2018-12-19 DIAGNOSIS — Z8542 Personal history of malignant neoplasm of other parts of uterus: Secondary | ICD-10-CM | POA: Diagnosis not present

## 2018-12-19 DIAGNOSIS — I10 Essential (primary) hypertension: Secondary | ICD-10-CM

## 2018-12-19 DIAGNOSIS — N1831 Chronic kidney disease, stage 3a: Secondary | ICD-10-CM | POA: Diagnosis present

## 2018-12-19 DIAGNOSIS — Z20828 Contact with and (suspected) exposure to other viral communicable diseases: Secondary | ICD-10-CM | POA: Diagnosis present

## 2018-12-19 DIAGNOSIS — I480 Paroxysmal atrial fibrillation: Secondary | ICD-10-CM | POA: Diagnosis present

## 2018-12-19 DIAGNOSIS — G2581 Restless legs syndrome: Secondary | ICD-10-CM | POA: Diagnosis present

## 2018-12-19 DIAGNOSIS — E119 Type 2 diabetes mellitus without complications: Secondary | ICD-10-CM | POA: Diagnosis not present

## 2018-12-19 DIAGNOSIS — E118 Type 2 diabetes mellitus with unspecified complications: Secondary | ICD-10-CM

## 2018-12-19 DIAGNOSIS — Z794 Long term (current) use of insulin: Secondary | ICD-10-CM

## 2018-12-19 DIAGNOSIS — R0789 Other chest pain: Secondary | ICD-10-CM | POA: Diagnosis present

## 2018-12-19 DIAGNOSIS — R911 Solitary pulmonary nodule: Secondary | ICD-10-CM | POA: Diagnosis present

## 2018-12-19 DIAGNOSIS — E1122 Type 2 diabetes mellitus with diabetic chronic kidney disease: Secondary | ICD-10-CM | POA: Diagnosis present

## 2018-12-19 DIAGNOSIS — E876 Hypokalemia: Secondary | ICD-10-CM | POA: Diagnosis present

## 2018-12-19 DIAGNOSIS — D631 Anemia in chronic kidney disease: Secondary | ICD-10-CM | POA: Diagnosis present

## 2018-12-19 DIAGNOSIS — R590 Localized enlarged lymph nodes: Secondary | ICD-10-CM

## 2018-12-19 DIAGNOSIS — Z8249 Family history of ischemic heart disease and other diseases of the circulatory system: Secondary | ICD-10-CM | POA: Diagnosis not present

## 2018-12-19 DIAGNOSIS — J449 Chronic obstructive pulmonary disease, unspecified: Secondary | ICD-10-CM | POA: Diagnosis present

## 2018-12-19 DIAGNOSIS — R599 Enlarged lymph nodes, unspecified: Secondary | ICD-10-CM | POA: Diagnosis not present

## 2018-12-19 DIAGNOSIS — Z7901 Long term (current) use of anticoagulants: Secondary | ICD-10-CM | POA: Diagnosis not present

## 2018-12-19 DIAGNOSIS — E782 Mixed hyperlipidemia: Secondary | ICD-10-CM | POA: Diagnosis present

## 2018-12-19 DIAGNOSIS — Z880 Allergy status to penicillin: Secondary | ICD-10-CM | POA: Diagnosis not present

## 2018-12-19 DIAGNOSIS — Z87891 Personal history of nicotine dependence: Secondary | ICD-10-CM | POA: Diagnosis not present

## 2018-12-19 DIAGNOSIS — R918 Other nonspecific abnormal finding of lung field: Secondary | ICD-10-CM | POA: Diagnosis not present

## 2018-12-19 DIAGNOSIS — F329 Major depressive disorder, single episode, unspecified: Secondary | ICD-10-CM | POA: Diagnosis present

## 2018-12-19 DIAGNOSIS — I4519 Other right bundle-branch block: Secondary | ICD-10-CM | POA: Diagnosis not present

## 2018-12-19 HISTORY — PX: VIDEO BRONCHOSCOPY WITH ENDOBRONCHIAL ULTRASOUND: SHX6177

## 2018-12-19 LAB — CBC
HCT: 28.6 % — ABNORMAL LOW (ref 36.0–46.0)
Hemoglobin: 9.4 g/dL — ABNORMAL LOW (ref 12.0–15.0)
MCH: 30.9 pg (ref 26.0–34.0)
MCHC: 32.9 g/dL (ref 30.0–36.0)
MCV: 94.1 fL (ref 80.0–100.0)
Platelets: 162 10*3/uL (ref 150–400)
RBC: 3.04 MIL/uL — ABNORMAL LOW (ref 3.87–5.11)
RDW: 14.4 % (ref 11.5–15.5)
WBC: 6.4 10*3/uL (ref 4.0–10.5)
nRBC: 0 % (ref 0.0–0.2)

## 2018-12-19 LAB — BASIC METABOLIC PANEL
Anion gap: 12 (ref 5–15)
BUN: 15 mg/dL (ref 8–23)
CO2: 25 mmol/L (ref 22–32)
Calcium: 8.8 mg/dL — ABNORMAL LOW (ref 8.9–10.3)
Chloride: 102 mmol/L (ref 98–111)
Creatinine, Ser: 1.07 mg/dL — ABNORMAL HIGH (ref 0.44–1.00)
GFR calc Af Amer: 59 mL/min — ABNORMAL LOW (ref >60)
GFR calc non Af Amer: 51 mL/min — ABNORMAL LOW (ref >60)
Glucose, Bld: 278 mg/dL — ABNORMAL HIGH (ref 70–99)
Potassium: 3.5 mmol/L (ref 3.5–5.1)
Sodium: 139 mmol/L (ref 135–145)

## 2018-12-19 LAB — GLUCOSE, CAPILLARY
Glucose-Capillary: 194 mg/dL — ABNORMAL HIGH (ref 70–99)
Glucose-Capillary: 193 mg/dL — ABNORMAL HIGH (ref 70–99)
Glucose-Capillary: 136 mg/dL — ABNORMAL HIGH (ref 70–99)
Glucose-Capillary: 148 mg/dL — ABNORMAL HIGH (ref 70–99)

## 2018-12-19 LAB — MAGNESIUM: Magnesium: 2.1 mg/dL (ref 1.7–2.4)

## 2018-12-19 SURGERY — BRONCHOSCOPY, WITH EBUS
Anesthesia: General

## 2018-12-19 MED ORDER — PHENYLEPHRINE HCL (PRESSORS) 10 MG/ML IV SOLN
INTRAVENOUS | Status: AC
  Filled 2018-12-19: qty 1

## 2018-12-19 MED ORDER — DEXAMETHASONE SODIUM PHOSPHATE 10 MG/ML IJ SOLN
INTRAMUSCULAR | Status: DC | PRN
  Administered 2018-12-19: 4 mg via INTRAVENOUS

## 2018-12-19 MED ORDER — PHENYLEPHRINE HCL-NACL 10-0.9 MG/250ML-% IV SOLN
INTRAVENOUS | Status: DC | PRN
  Administered 2018-12-19: 40 ug/min via INTRAVENOUS

## 2018-12-19 MED ORDER — PHENYLEPHRINE 40 MCG/ML (10ML) SYRINGE FOR IV PUSH (FOR BLOOD PRESSURE SUPPORT)
PREFILLED_SYRINGE | INTRAVENOUS | Status: AC
  Filled 2018-12-19: qty 20

## 2018-12-19 MED ORDER — ONDANSETRON HCL 4 MG/2ML IJ SOLN
INTRAMUSCULAR | Status: AC
  Filled 2018-12-19: qty 2

## 2018-12-19 MED ORDER — LACTATED RINGERS IV SOLN
INTRAVENOUS | Status: DC
  Administered 2018-12-19: 16:00:00 via INTRAVENOUS

## 2018-12-19 MED ORDER — PHENYLEPHRINE HCL (PRESSORS) 10 MG/ML IV SOLN
INTRAVENOUS | Status: DC | PRN
  Administered 2018-12-19 (×5): 80 ug via INTRAVENOUS

## 2018-12-19 MED ORDER — LACTATED RINGERS IV SOLN
Freq: Once | INTRAVENOUS | Status: AC
  Administered 2018-12-19: 18:00:00 via INTRAVENOUS

## 2018-12-19 MED ORDER — ALBUMIN HUMAN 5 % IV SOLN
12.5000 g | Freq: Once | INTRAVENOUS | Status: AC
  Administered 2018-12-19: 12.5 g via INTRAVENOUS

## 2018-12-19 MED ORDER — LIDOCAINE 2% (20 MG/ML) 5 ML SYRINGE
INTRAMUSCULAR | Status: AC
  Filled 2018-12-19: qty 5

## 2018-12-19 MED ORDER — DEXAMETHASONE SODIUM PHOSPHATE 10 MG/ML IJ SOLN
INTRAMUSCULAR | Status: AC
  Filled 2018-12-19: qty 1

## 2018-12-19 MED ORDER — LIDOCAINE 2% (20 MG/ML) 5 ML SYRINGE
INTRAMUSCULAR | Status: DC | PRN
  Administered 2018-12-19: 40 mg via INTRAVENOUS

## 2018-12-19 MED ORDER — FENTANYL CITRATE (PF) 250 MCG/5ML IJ SOLN
INTRAMUSCULAR | Status: AC
  Filled 2018-12-19: qty 5

## 2018-12-19 MED ORDER — FENTANYL CITRATE (PF) 100 MCG/2ML IJ SOLN
INTRAMUSCULAR | Status: DC | PRN
  Administered 2018-12-19: 50 ug via INTRAVENOUS

## 2018-12-19 MED ORDER — ONDANSETRON HCL 4 MG/2ML IJ SOLN: 4.0000 mg | Freq: Once | INTRAMUSCULAR | Status: DC | PRN

## 2018-12-19 MED ORDER — FENTANYL CITRATE (PF) 100 MCG/2ML IJ SOLN: 25.0000 ug | INTRAMUSCULAR | Status: DC | PRN

## 2018-12-19 MED ORDER — ONDANSETRON HCL 4 MG/2ML IJ SOLN
INTRAMUSCULAR | Status: DC | PRN
  Administered 2018-12-19: 4 mg via INTRAVENOUS

## 2018-12-19 MED ORDER — ALBUMIN HUMAN 5 % IV SOLN
INTRAVENOUS | Status: AC
  Filled 2018-12-19: qty 250

## 2018-12-19 MED ORDER — PROPOFOL 10 MG/ML IV BOLUS
INTRAVENOUS | Status: DC | PRN
  Administered 2018-12-19: 100 mg via INTRAVENOUS

## 2018-12-19 MED ORDER — ROCURONIUM BROMIDE 100 MG/10ML IV SOLN
INTRAVENOUS | Status: DC | PRN
  Administered 2018-12-19: 50 mg via INTRAVENOUS

## 2018-12-19 MED ORDER — SUGAMMADEX SODIUM 200 MG/2ML IV SOLN
INTRAVENOUS | Status: DC | PRN
  Administered 2018-12-19: 200 mg via INTRAVENOUS

## 2018-12-19 MED ORDER — 0.9 % SODIUM CHLORIDE (POUR BTL) OPTIME
TOPICAL | Status: DC | PRN
  Administered 2018-12-19: 1000 mL

## 2018-12-19 MED ORDER — PROPOFOL 10 MG/ML IV BOLUS
INTRAVENOUS | Status: AC
  Filled 2018-12-19: qty 20

## 2018-12-19 MED ORDER — POTASSIUM CHLORIDE CRYS ER 20 MEQ PO TBCR: 40.0000 meq | EXTENDED_RELEASE_TABLET | Freq: Two times a day (BID) | ORAL | Status: DC

## 2018-12-19 SURGICAL SUPPLY — 31 items
ADAPTER VALVE BIOPSY EBUS (MISCELLANEOUS) IMPLANT
ADPTR VALVE BIOPSY EBUS (MISCELLANEOUS)
BRUSH CYTOL CELLEBRITY 1.5X140 (MISCELLANEOUS) IMPLANT
CANISTER SUCT 3000ML PPV (MISCELLANEOUS) ×3 IMPLANT
CONT SPEC 4OZ CLIKSEAL STRL BL (MISCELLANEOUS) ×3 IMPLANT
COVER BACK TABLE 60X90IN (DRAPES) ×3 IMPLANT
FORCEPS BIOP RJ4 1.8 (CUTTING FORCEPS) IMPLANT
GAUZE SPONGE 4X4 12PLY STRL (GAUZE/BANDAGES/DRESSINGS) ×3 IMPLANT
GLOVE BIO SURGEON STRL SZ7.5 (GLOVE) ×3 IMPLANT
GOWN STRL REUS W/ TWL LRG LVL3 (GOWN DISPOSABLE) ×1 IMPLANT
GOWN STRL REUS W/TWL LRG LVL3 (GOWN DISPOSABLE) ×2
KIT CLEAN ENDO COMPLIANCE (KITS) ×6 IMPLANT
KIT TURNOVER KIT B (KITS) ×3 IMPLANT
MARKER SKIN DUAL TIP RULER LAB (MISCELLANEOUS) ×3 IMPLANT
NEEDLE ASPIRATION VIZISHOT 19G (NEEDLE) IMPLANT
NEEDLE ASPIRATION VIZISHOT 21G (NEEDLE) ×3 IMPLANT
NS IRRIG 1000ML POUR BTL (IV SOLUTION) ×3 IMPLANT
OIL SILICONE PENTAX (PARTS (SERVICE/REPAIRS)) ×3 IMPLANT
SYR 20ML ECCENTRIC (SYRINGE) ×6 IMPLANT
SYR 20ML LL LF (SYRINGE) ×3 IMPLANT
SYR 50ML SLIP (SYRINGE) IMPLANT
SYR 5ML LUER SLIP (SYRINGE) ×3 IMPLANT
TOWEL GREEN STERILE FF (TOWEL DISPOSABLE) ×3 IMPLANT
TRAP SPECIMEN MUCOUS 40CC (MISCELLANEOUS) ×6 IMPLANT
TUBE CONNECTING 20'X1/4 (TUBING) ×1
TUBE CONNECTING 20X1/4 (TUBING) ×2 IMPLANT
UNDERPAD 30X30 (UNDERPADS AND DIAPERS) ×3 IMPLANT
VALVE BIOPSY  SINGLE USE (MISCELLANEOUS) ×2
VALVE BIOPSY SINGLE USE (MISCELLANEOUS) ×1 IMPLANT
VALVE SUCTION BRONCHIO DISP (MISCELLANEOUS) ×3 IMPLANT
WATER STERILE IRR 1000ML POUR (IV SOLUTION) ×3 IMPLANT

## 2018-12-19 NOTE — Transfer of Care (Addendum)
Immediate Anesthesia Transfer of Care Note  Patient: Ellen Jordan  Procedure(s) Performed: VIDEO BRONCHOSCOPY WITH ENDOBRONCHIAL ULTRASOUND (N/A )  Patient Location: PACU  Anesthesia Type:General  Level of Consciousness: drowsy  Airway & Oxygen Therapy: Patient Spontanous Breathing and Patient connected to face mask oxygen  Post-op Assessment: Report given to RN and Post -op Vital signs reviewed and stable  Post vital signs: Reviewed  Last Vitals:  Vitals Value Taken Time  BP 98/81 12/19/18 1713  Temp 36.3 C 12/19/18 1710  Pulse 147 12/19/18 1724  Resp 15 12/19/18 1724  SpO2 91 % 12/19/18 1724  Vitals shown include unvalidated device data.  Last Pain:  Vitals:   12/19/18 1210  TempSrc: Oral  PainSc:       Patients Stated Pain Goal: 2 (35/45/62 [id number])  Complications: No apparent anesthesia complications

## 2018-12-19 NOTE — Discharge Summary (Signed)
Date of Admission: 12/18/18 Date of Discharge: 12/19/18  Diagnoses:  # Atrial fibrillation with rapid ventricular response # Abnormal lung CT  Hospital Course: Patient admitted with atrial fibrillation with rapid ventricular response discovered incidentally in pre-op for bronchoscopy.  She was asymptomatic.  Started back on home meds and heart rate improved.  Etiology of the RVR was a combination of not taking home AVBs and stopping flecainide (latter thought to possibly be contributing to her progressive ILD changes on CT).  She underwent an uneventful bronchoscopy on the evening of 12/19/18 and will be discharged home with close follow up in my office.  Discharge meds: No current facility-administered medications on file prior to encounter.   Current Outpatient Medications on File Prior to Encounter  Medication Sig Dispense Refill  . albuterol (PROVENTIL HFA;VENTOLIN HFA) 108 (90 Base) MCG/ACT inhaler Inhale 2 puffs into the lungs every 6 (six) hours as needed for wheezing or shortness of breath. 1 Inhaler 0  . atenolol (TENORMIN) 50 MG tablet TAKE 1 TABLET EVERY DAY 90 tablet 0  . atorvastatin (LIPITOR) 40 MG tablet TAKE 1 TABLET EVERY DAY 90 tablet 3  . b complex vitamins tablet Take 1 tablet by mouth daily.    . Biotin 5000 MCG CAPS Take 1 capsule by mouth daily.    . Calcium-Phosphorus-Vitamin D (CALCIUM GUMMIES PO) Take 1 tablet by mouth daily. [phone]    . chlorpheniramine (EQ CHLORTABS) 4 MG tablet Take 4 mg by mouth 2 (two) times daily as needed for allergies.    . cholecalciferol (VITAMIN D) 1000 units tablet Take 1,000 Units by mouth daily.    Marland Kitchen diltiazem (CARDIZEM CD) 180 MG 24 hr capsule Take 1 capsule (180 mg total) by mouth daily. 90 capsule 3  . diphenhydrAMINE (BENADRYL) 25 MG tablet Take 25 mg by mouth daily.    Marland Kitchen ELIQUIS 5 MG TABS tablet TAKE 1 TABLET TWICE DAILY 180 tablet 3  . fluticasone furoate-vilanterol (BREO ELLIPTA) 100-25 MCG/INH AEPB Inhale 1 puff into the lungs  daily. 2 each 0  . insulin aspart (NOVOLOG) 100 UNIT/ML injection Inject 10-16 Units into the skin 3 (three) times daily with meals. 60 mL 2  . insulin detemir (LEVEMIR) 100 UNIT/ML injection Inject 0.5 mLs (50 Units total) into the skin at bedtime. 60 mL 1  . ipratropium-albuterol (DUONEB) 0.5-2.5 (3) MG/3ML SOLN Take 3 mLs by nebulization every 6 (six) hours as needed (wheezing/ shortness of breath).    . liraglutide (VICTOZA) 18 MG/3ML SOPN Inject 0.3 mLs (1.8 mg total) into the skin daily. 27 mL 1  . lisinopril (PRINIVIL,ZESTRIL) 20 MG tablet TAKE 1 TABLET (20 MG TOTAL) BY MOUTH DAILY. (Patient taking differently: Take 10 mg by mouth daily. Patient is currently taking a 1/2 tablet due to low BP) 90 tablet 0  . Misc Natural Products (ESTROVEN + ENERGY MAX STRENGTH) TABS Take 1 tablet by mouth daily.    . Multiple Vitamin (MULTIVITAMIN) tablet Take 1 tablet by mouth daily.    . Omega-3 Fatty Acids (FISH OIL) 1200 MG CAPS Take 1 capsule by mouth daily.    Marland Kitchen omeprazole (PRILOSEC) 20 MG capsule TAKE 1 CAPSULE EVERY DAY 90 capsule 1  . sertraline (ZOLOFT) 100 MG tablet TAKE 1 TABLET (100 MG TOTAL) BY MOUTH DAILY. 90 tablet 0  . torsemide (DEMADEX) 20 MG tablet Take 20 mg by mouth daily.    . vitamin E (VITAMIN E) 400 UNIT capsule Take 400 Units by mouth daily.    Marland Kitchen ACCU-CHEK FASTCLIX  LANCETS MISC CHECK BLOOD GLUCOSE FOUR TIMES DAILY 408 each 2  . ACCU-CHEK GUIDE test strip USE AS INSTRUCTED FOUR TIMES DAILY 350 strip 1  . ACCU-CHEK SMARTVIEW test strip CHECK BLOOD GLUCOSE FOUR TIMES DAILY 400 each 2  . Alcohol Swabs (B-D SINGLE USE SWABS REGULAR) PADS USE FOUR TIMES DAILY 400 each 2  . Blood Glucose Monitoring Suppl (ACCU-CHEK GUIDE) w/Device KIT 1 each by Does not apply route 4 (four) times daily. 1 kit 0  . flecainide (TAMBOCOR) 100 MG tablet TAKE 1 TABLET TWICE DAILY ( DOSE INCREASE ) 180 tablet 3  . glucose blood (ACCU-CHEK GUIDE) test strip Use as instructed 4 x daily 400 each 1    Erskine Emery  MD PCCM

## 2018-12-19 NOTE — Progress Notes (Signed)
IV removed and discharge instructions have been given.

## 2018-12-19 NOTE — Anesthesia Procedure Notes (Signed)
Procedure Name: Intubation Date/Time: 12/19/2018 3:59 PM Performed by: Candis Shine, CRNA Pre-anesthesia Checklist: Patient identified, Emergency Drugs available, Suction available and Patient being monitored Patient Re-evaluated:Patient Re-evaluated prior to induction Oxygen Delivery Method: Circle System Utilized Preoxygenation: Pre-oxygenation with 100% oxygen Induction Type: IV induction Ventilation: Mask ventilation without difficulty and Oral airway inserted - appropriate to patient size Laryngoscope Size: Mac and 3 Grade View: Grade I Tube type: Oral Tube size: 8.5 mm Number of attempts: 1 Airway Equipment and Method: Stylet and Oral airway Placement Confirmation: ETT inserted through vocal cords under direct vision,  positive ETCO2 and breath sounds checked- equal and bilateral Secured at: 21 cm Tube secured with: Tape Dental Injury: Teeth and Oropharynx as per pre-operative assessment

## 2018-12-19 NOTE — Telephone Encounter (Signed)
-----  Message from [name], MD sent at 12/19/2018  4:55 PM EST ----- Regarding: 1 week televisit with midlevel to review bronch findings Thanks!

## 2018-12-19 NOTE — Discharge Instructions (Signed)
-  Restart flecainide tomorrow morning - Restart eliquis tomorrow for evening dose - Restart your insulin full dose tomorrow - Will touch base with you next week, call if questions or concerns

## 2018-12-19 NOTE — Anesthesia Preprocedure Evaluation (Addendum)
Anesthesia Evaluation  Patient identified by MRN, date of birth, ID band Patient awake    Reviewed: Allergy & Precautions, NPO status , Patient's Chart, lab work & pertinent test results, reviewed documented beta blocker date and time   History of Anesthesia Complications (+) PONV and history of anesthetic complications  Airway Mallampati: I  TM Distance: >3 FB Neck ROM: Full    Dental  (+) Teeth Intact, Dental Advisory Given   Pulmonary shortness of breath, former smoker,  Former smoker, 50 pack year history, quit in 2007   Pulmonary exam normal        Cardiovascular hypertension, Pt. on medications and Pt. on home beta blockers Normal cardiovascular exam+ dysrhythmias Atrial Fibrillation   In Afib with RVR yesterday in preop, admitted overnight for rate control. Now rate controlled in 70s NSR off diltiazem drip  Last echo 02/23/2018:  1. The left ventricle has normal systolic function with an ejection fraction of 60-65%. The cavity size was normal. There is mildly increased left ventricular wall thickness. Left ventricular diastolic Doppler parameters are consistent with impaired  relaxation Elevated mean left atrial pressure.  2. The right ventricle has normal systolic function. The cavity was mildly enlarged. There is no increase in right ventricular wall thickness.  3. There is systolic flattening of the ventricular septum suggesting elevated RV pressure.  4. Left atrial size was severely dilated.  5. Right atrial size was severely dilated.  6. Diffuse calcification of MV leaflets. Severe thickening of the mitral valve leaflet. Mild mitral valve stenosis.  7. The tricuspid valve is normal in structure.  8. The aortic valve is tricuspid Mild thickening of the aortic valve no stenosis of the aortic valve.  9. Pulmonary hypertension is moderately elevated, PASP 59. 10. Right atrial pressure is estimated at 3 mmHg.    Neuro/Psych PSYCHIATRIC DISORDERS Anxiety Depression Restless leg syndrome    GI/Hepatic GERD  Medicated and Controlled,  Endo/Other  diabetes, Poorly Controlled, Type 2, Oral Hypoglycemic AgentsObesity BMI 31  Last A1c 7.2 in 12/2018  Renal/GU    Hx uterine ca    Musculoskeletal negative musculoskeletal ROS (+)   Abdominal (+) + obese,   Peds  Hematology  (+) anemia , Hct 28.6   Anesthesia Other Findings   Reproductive/Obstetrics negative OB ROS                            Anesthesia Physical Anesthesia Plan  ASA: III  Anesthesia Plan: General   Post-op Pain Management:    Induction: Intravenous  PONV Risk Score and Plan: 3 and Ondansetron, Midazolam and Treatment may vary due to age or medical condition  Airway Management Planned: Oral ETT  Additional Equipment: None  Intra-op Plan:   Post-operative Plan: Extubation in OR  Informed Consent: I have reviewed the patients History and Physical, chart, labs and discussed the procedure including the risks, benefits and alternatives for the proposed anesthesia with the patient or authorized representative who has indicated his/her understanding and acceptance.     Dental advisory given  Plan Discussed with: CRNA  Anesthesia Plan Comments:        Anesthesia Quick Evaluation

## 2018-12-19 NOTE — Progress Notes (Signed)
Ellen Jordan Kitchen  PROGRESS NOTE    Ellen Jordan  VOJ:[id number] DOB: 31-May-1945 DOA: 12/18/2018 PCP: [name], MD   Brief Narrative:   Ellen Jordan is a 73 y.o. female presenting with progressive dyspnea on exertion over the last year for which she has been on oxygen for at least 8 or 9 months now.  She has been being followed by Dr. Luan Pulling in [place] who ordered chest CT on 7/10, where there was numerous subcentimeter pulmonary nodules.  Previous bronchoscopy with BAL did not show any evidence of MAC.  Patient had undergone pulmonary function studies noting moderate restriction with preserved ratio.  There is suspicion for combination of COPD and medication induced interstitial lung disease possibly related to flecainide.  She reports that this medication was stopped approximately 1 month ago.  Recent repeat CT obtained from 11/24 revealed both the mediastinal adenopathy concerning for lymphoproliferative disorder or metastatic process.  While in preop holding patient was noted to go into atrial fibrillation with heart rates into the 150s with blood pressures maintained.   12/19/18: Resting in chair this AM and denies complaints. Eager to get bronch today.    Assessment & Plan:   Principal Problem:   Atrial fibrillation with RVR (HCC) Active Problems:   Essential hypertension, benign   Controlled diabetes mellitus type 2 with complications (HCC)   Hypokalemia   Normocytic anemia   Mediastinal lymphadenopathy   Pulmonary nodules  Atrial fibrillation with RVR on chronic anticoagulation:     - Acute on Chronic; HRs were in the 150s.      - Normally on Eliquis, but on hold since August      - continue diltiazem drip stopped; dilt PO 150m started     - continue holding Eliquis for procedure; resume when cleared by pulm  Mediastinal lymphadenopathy and pulmonary nodule: Acute.       - Recent CT of the chest from 11/24.     - bronch today     - per PCCM  COPD, without acute exacerbation    - continue home inhaler and duo nebs PRN  Essential hypertension:     - home meds atenolol 50 mg daily, lisinopril 20 mg daily, and toresemide 20 mg daily     - continue home regimen  Hypokalemia     - improved     - continue PO potassium  Diabetes mellitus type 2     - Last hemoglobin A1c 6.4 in 07/2018.       - Pt normally takes levemir 50 units nightly     - Hypoglycemic protocols     - Hold Victoza     - glucose up, continue SSI for now  chronic normocytic anemia     - at admission, hemoglobin 10.3; which appears near patient's baseline.     - no evidence of frank bleed, monitor  Chronic kidney disease stage IIIa     - Stable, monitor  Skin lesion     - F/u w/ dermatology outpt  DVT prophylaxis: SCDs Code Status: FULL   Disposition Plan: TBD  Consultants:   PCCM   ROS:  Denies CP, N, V, ab pain. Reports dyspnea. Remainder 10-pt ROS is negative for all not previously mentioned.  Subjective: "It would be nice if it were done earlier"  Objective: Vitals:   12/19/18 0754 12/19/18 0820 12/19/18 1010 12/19/18 1210  BP: 103/70  119/70 120/73  Pulse: 89  99 (!) 108  Resp: 17  14 16  Temp: 97.9 F (36.6 C)   98.6 F (37 C)  TempSrc: Oral   Oral  SpO2: 100% 95% 100% 97%  Weight:      Height:        Intake/Output Summary (Last 24 hours) at 12/19/2018 1441 Last data filed at 12/19/2018 1012 Gross per 24 hour  Intake 952.5 ml  Output 800 ml  Net 152.5 ml   Filed Weights   28-Dec-2018 1104  Weight: 80 kg    Examination:  General: 73 y.o. female resting in bed in NAD Cardiovascular: irregular, +S1, S2, no m/g/r, equal pulses throughout Respiratory: soft rhonchi b/l, normal WOB GI: BS+, NDNT, no masses noted, no organomegaly noted MSK: No e/c/c Neuro: A&O x 3, no focal deficits Psyc: Appropriate interaction and affect, calm/cooperative   Data Reviewed: I have personally reviewed following labs and imaging studies.  CBC: Recent Labs  Lab  28-Dec-2018 1145 12/19/18 0218  WBC 7.0 6.4  HGB 10.3* 9.4*  HCT 31.9* 28.6*  MCV 94.1 94.1  PLT 180 759   Basic Metabolic Panel: Recent Labs  Lab Dec 28, 2018 1145 12-28-18 1809 12/19/18 0218  NA 141  --  139  K 3.3*  --  3.5  CL 102  --  102  CO2 26  --  25  GLUCOSE 187*  --  278*  BUN 17  --  15  CREATININE 1.12*  --  1.07*  CALCIUM 9.3  --  8.8*  MG  --  1.5* 2.1   GFR: Estimated Creatinine Clearance: 45.5 mL/min (A) (by C-G formula based on SCr of 1.07 mg/dL (H)). Liver Function Tests: Recent Labs  Lab December 28, 2018 1145  AST 29  ALT 21  ALKPHOS 66  BILITOT 1.1  PROT 7.1  ALBUMIN 3.9   No results for input(s): LIPASE, AMYLASE in the last 168 hours. No results for input(s): AMMONIA in the last 168 hours. Coagulation Profile: Recent Labs  Lab 12/28/2018 1145  INR 1.3*   Cardiac Enzymes: No results for input(s): CKTOTAL, CKMB, CKMBINDEX, TROPONINI in the last 168 hours. BNP (last 3 results) Recent Labs    09/12/18 1201  PROBNP 51.0   HbA1C: Recent Labs    12/28/18 1102  HGBA1C 7.2*   CBG: Recent Labs  Lab 12/28/2018 1621 2018-12-28 1720 2018-12-28 2222 12/19/18 0636 12/19/18 1208  GLUCAP 141* 180* 230* 194* 193*   Lipid Profile: No results for input(s): CHOL, HDL, LDLCALC, TRIG, CHOLHDL, LDLDIRECT in the last 72 hours. Thyroid Function Tests: No results for input(s): TSH, T4TOTAL, FREET4, T3FREE, THYROIDAB in the last 72 hours. Anemia Panel: No results for input(s): VITAMINB12, FOLATE, FERRITIN, TIBC, IRON, RETICCTPCT in the last 72 hours. Sepsis Labs: No results for input(s): PROCALCITON, LATICACIDVEN in the last 168 hours.  Recent Results (from the past 240 hour(s))  SARS CORONAVIRUS 2 (TAT 6-24 HRS) Nasopharyngeal Nasopharyngeal Swab     Status: None   Collection Time: 12/14/18 12:47 PM   Specimen: Nasopharyngeal Swab  Result Value Ref Range Status   SARS Coronavirus 2 NEGATIVE NEGATIVE Final    Comment: (NOTE) SARS-CoV-2 target nucleic acids are  NOT DETECTED. The SARS-CoV-2 RNA is generally detectable in upper and lower respiratory specimens during the acute phase of infection. Negative results do not preclude SARS-CoV-2 infection, do not rule out co-infections with other pathogens, and should not be used as the sole basis for treatment or other patient management decisions. Negative results must be combined with clinical observations, patient history, and epidemiological information. The expected result is Negative.  Fact Sheet for Patients: SugarRoll.be Fact Sheet for Healthcare Providers: https://www.woods-mathews.com/ This test is not yet approved or cleared by the [place] FDA and  has been authorized for detection and/or diagnosis of SARS-CoV-2 by FDA under an Emergency Use Authorization (EUA). This EUA will remain  in effect (meaning this test can be used) for the duration of the COVID-19 declaration under Section 56 4(b)(1) of the Act, 21 U.S.C. section 360bbb-3(b)(1), unless the authorization is terminated or revoked sooner. Performed at [facility], [place] [address] [place], [place] [address]       Radiology Studies: No results found.   Scheduled Meds: . atenolol  50 mg Oral Daily  . atorvastatin  40 mg Oral Daily  . diltiazem  180 mg Oral Daily  . diphenhydrAMINE  25 mg Oral QHS  . fluticasone furoate-vilanterol  1 puff Inhalation Daily  . insulin aspart  0-15 Units Subcutaneous TID WC  . insulin detemir  25 Units Subcutaneous Daily  . lisinopril  10 mg Oral Daily  . pantoprazole  40 mg Oral Daily  . potassium chloride  40 mEq Oral Once  . scopolamine  1 patch Transdermal Q72H  . sertraline  100 mg Oral Daily  . sodium chloride flush  3 mL Intravenous Q12H  . torsemide  20 mg Oral Daily   Continuous Infusions: . diltiazem (CARDIZEM) infusion Stopped (12/19/18 1058)  . lactated ringers 10 mL/hr at 12/18/18 1139     LOS: 0 days    Time spent: 25  minutes spent in the coordination of care today.    Jonnie Finner, DO [facility] Pager [phone]  If 7PM-7AM, please contact night-coverage [email or web address] Password [email or web address] 12/19/2018, 2:41 PM

## 2018-12-19 NOTE — Procedures (Signed)
Video Bronchoscopy with Endobronchial Ultrasound Procedure Note  Date of Operation: 12/19/2018  Pre-op Diagnosis: Adenopathy, infiltrate  Post-op Diagnosis: Adenopathy, infiltrate  Surgeon: Erskine Emery MD   Anesthesia: General endotracheal anesthesia  Operation: Flexible video fiberoptic bronchoscopy with endobronchial ultrasound and biopsies.  Estimated Blood Loss: less than 50   Complications: None immediate  Indications and History: Ellen Jordan is a 73 y.o. female with persistent adenopathy and infiltrates of unclear origin.  The risks, benefits, complications, treatment options and expected outcomes were discussed with the patient.  The possibilities of pneumothorax, pneumonia, reaction to medication, pulmonary aspiration, perforation of a viscus, bleeding, failure to diagnose a condition and creating a complication requiring transfusion or operation were discussed with the patient who freely signed the consent.    Description of Procedure: The patient was examined in the preoperative area and history and data from the preprocedure consultation were reviewed. It was deemed appropriate to proceed.  The patient was taken to OR, identified as Ellen Jordan and the procedure verified as Flexible Video Fiberoptic Bronchoscopy.  A Time Out was held and the above information confirmed. After being taken to the operating room general anesthesia was initiated and the patient  was orally intubated. The video fiberoptic bronchoscope was introduced via the endotracheal tube and a general inspection was performed which showed mild generalized edema of airways and no endobronchial lesions. BAL and brushing performed of RML.  The standard scope was then withdrawn and the endobronchial ultrasound was used to identify and characterize the peritracheal, hilar and bronchial lymph nodes. Inspection showed enlarged stations 4R and 7. Using real-time ultrasound guidance Wang needle biopsies were take from  Station 7/4R nodes and were sent for cytology. The patient tolerated the procedure well without apparent complications. There was no significant blood loss. The bronchoscope was withdrawn. Anesthesia was reversed and the patient was taken to the PACU for recovery.   Samples: 1. Wang needle biopsies from subcarinal (7) node 2. Wang needle biopsies from right pretracheal (4R) node 3. BAL RML 4. Brushing RML  Plans:  The patient will be discharged from the PACU to home when recovered from anesthesia. We will review the cytology, pathology and microbiology results with the patient when they become available. Outpatient followup will be with Dr. Tamala Julian.    Erskine Emery MD PCCM

## 2018-12-19 NOTE — Telephone Encounter (Signed)
LMTCB.   Will route to box to f/u.

## 2018-12-20 LAB — ACID FAST SMEAR (AFB, MYCOBACTERIA)
Acid Fast Smear: NEGATIVE
Acid Fast Smear: NEGATIVE

## 2018-12-20 NOTE — Anesthesia Postprocedure Evaluation (Signed)
Anesthesia Post Note  Patient: Ellen Jordan  Procedure(s) Performed: VIDEO BRONCHOSCOPY WITH ENDOBRONCHIAL ULTRASOUND (N/A )     Patient location during evaluation: PACU Anesthesia Type: General Level of consciousness: awake and alert, oriented and patient cooperative Pain management: pain level controlled Vital Signs Assessment: post-procedure vital signs reviewed and stable Respiratory status: spontaneous breathing, nonlabored ventilation and respiratory function stable Cardiovascular status: blood pressure returned to baseline and stable Postop Assessment: no apparent nausea or vomiting Anesthetic complications: no    Last Vitals:  Vitals:   12/19/18 1915 12/19/18 1930  BP: 96/63   Pulse: (!) 54 96  Resp: 17 (!) 22  Temp:    SpO2: 100% 100%    Last Pain:  Vitals:   12/19/18 1930  TempSrc:   PainSc: 0-No pain                 Pervis Hocking

## 2018-12-21 LAB — CYTOLOGY - NON PAP

## 2018-12-22 LAB — CULTURE, RESPIRATORY W GRAM STAIN
Culture: NO GROWTH
Culture: NO GROWTH

## 2018-12-24 NOTE — Telephone Encounter (Signed)
Patient is scheduled with NP for 12/26/2018 at 2:30 for a telephone visit to review results. Nothing further needed at this time.

## 2018-12-24 NOTE — Telephone Encounter (Signed)
Needs 1 week televisit with APP to review bronch. Per Dr. Tamala Julian, no acute findings. May need second opinion with Dr. Chase Caller or [facility]

## 2018-12-26 ENCOUNTER — Encounter: Payer: Self-pay | Admitting: Primary Care

## 2018-12-26 ENCOUNTER — Ambulatory Visit (INDEPENDENT_AMBULATORY_CARE_PROVIDER_SITE_OTHER): Payer: Medicare HMO | Admitting: Primary Care

## 2018-12-26 ENCOUNTER — Other Ambulatory Visit: Payer: Self-pay

## 2018-12-26 DIAGNOSIS — R0600 Dyspnea, unspecified: Secondary | ICD-10-CM | POA: Diagnosis not present

## 2018-12-26 DIAGNOSIS — R9389 Abnormal findings on diagnostic imaging of other specified body structures: Secondary | ICD-10-CM | POA: Diagnosis not present

## 2018-12-26 NOTE — Progress Notes (Signed)
Virtual Visit via Telephone Note  I connected with Ellen Jordan on 12/26/18 at  2:30 PM EST by telephone and verified that I am speaking with the correct person using two identifiers.  Location: Patient: Home  Provider: Office   I discussed the limitations, risks, security and privacy concerns of performing an evaluation and management service by telephone and the availability of in person appointments. I also discussed with the patient that there may be a patient responsible charge related to this service. The patient expressed understanding and agreed to proceed.  Synopsis: Referred in Sept 2020 for Abnormal imaging, Pulm HTN by [name], MD  History of Present Illness: 73 year old female, former smoker quit 2007 (50 pack year hx). PMH significant for pulmonary nodules, mediastinal lymphaenopathy, afib (on anticoagulation), HTN, group 2/3 pulmonary hypertension, type 2 diabetes, hyperlipidemia, normocytic anemia. Patient of Dr. Tamala Julian, last seen on 12/13/18 for adenopathy, dyspnea/?COPD.  CT scan ordered by Dr. Luan Pulling revealed numerous subcentimeter pulmonary nodules with question MAC. Echocardiogram showed pulmonary hypertension with an RVSP 59. She is on flecainide for afib s/p failed cardioversion. No concerning exposure except for possible distant asbestos exposure. Rheum workup negative. PFTs showed moderate restriction with preserved ratio. DLCO markedly reduced but corrects for alveolar ventilation. During last visit flecainide was stopped and CT chest was ordered. Breathing continues to worsen. MMRC 2 dyspnea. CT chest showed worsening septal thickening and adenopathy. Felt to be c/w ILD but with enlarging adenopathy should also rule out atypical infections or lymphoma.   12/26/2018 Patient contacted today for bronchoscopy follow-up/ persistent adenopathy and infiltrates of unclear origin. Underwent flexible video bronchoscopy with EBUS and biopsies on 12/9 with Dr. Tamala Julian.  Bronchoscopy showed mild generalized edema of the airways and no endobronchial lesion. Respiratory/AFB/fungal cultures from RML showed no growth and/or were negative. Pathology from RML and lymph node fine needle aspiration/washings showed no malignant cells.   Feeling about the same. Not able to walk far d/t fatigue and dyspnea. Out of breath with 2L oxygen. She is compliant with Breo inhaler, uses nebulizer approx 4 times a week with some temporary improvement. Reports occasional wheezing, no cough. No weight loss. She is back on flecainide, trial off medication with no noticeable improvement in her breathing. No fevers.   Observations/Objective:  - No observed shortness of breath, wheezing or cough during phone call  Significant testing reviewed:      Labs 09/12/18:     ANCA negative     ANA negative     Sed rate normal     CK-MB Normal      Eosinophils 100      BNP 51  Chest Imaging: CT chest Nov 2020- Mosacism and septal thickening, enlarging mediastinal adenopathy. ill-defined nodularity and surrounding ground-glass in RML with tree-in-bud nodularity.  Pathology:  07/30/18- bronchial wash read as no malignant cells, cannot find AFB smear 12/19/18- Washing and bx RML/lymph nodes showed no malignant cell; Resp/AFB/fungal cultures normal  Echocardiogram:  IMPRESSIONS 1. The left ventricle has normal systolic function with an ejection fraction of 60-65%. The cavity size was normal. There is mildly increased left ventricular wall thickness. Left ventricular diastolic Doppler parameters are consistent with impaired  relaxation Elevated mean left atrial pressure. 2. The right ventricle has normal systolic function. The cavity was mildly enlarged. There is no increase in right ventricular wall thickness. 3. There is systolic flattening of the ventricular septum suggesting elevated RV pressure. 4. Left atrial size was severely dilated. 5. Right atrial size  was severely dilated. 6.  Diffuse calcification of MV leaflets. Severe thickening of the mitral valve leaflet. Mild mitral valve stenosis. 7. The tricuspid valve is normal in structure. 8. The aortic valve is tricuspid Mild thickening of the aortic valve no stenosis of the aortic valve. 9. Pulmonary hypertension is moderately elevated, PASP 59. 10. Right atrial pressure is estimated at 3 mmHg  EKG in office RBBB, sinus bradycardia   Assessment and Plan:  Abnormal CT imaging - CT chest ordered by Dr. Luan Pulling in July 2020 revealed numerous subcentimeter pulmonary nodules with question MAC - CT chest in Nov 2020 showed new areas of ill-defined nodularity and surrounding ground-glass in RML with tree-in-bud nodularity. Enlarging mediastinal adenopathy. Sarcoidosis is considered in the differential, potential interstitial lung disease with signs of nodular septal thickening and fissural thickening.  - Flexible bronchoscopy/ EBUS on 12/19/18 by Dr. Joanna Hews showed no acute findings. Mild generalized edema of the airways were visualized with no endobronchial lesion. Pathology negative for malignant cells; cultures with no growth  - Discuss with Dr. Tamala Julian d/t inconclusivity of bronchoscopy recommended referring to Dr. Ronie Spies for second opinion. I would also consider ID referral.   Dyspnea: - PFTs showed moderate restriction with preserved ratio. DLCO markedly reduced but corrects for alveolar ventilation. - FEV1 0.97 (47%), ratio 76 +BD response - Maintained on Breo 100 - one puff once daily; prn duoneb q 6 hours   Chronic respiratory failure - Continue 2L oxygen, titrate to keep >90%  Follow Up Instructions:   - FU Dr. Chase Caller or Dr. Vaughan Browner in 2-4 weeks for second opinion regarding abnormal CT chest imaging with inconclusive bronchoscopy  - Sending ILD questionnaire for patient to complete prior to next visit   I discussed the assessment and treatment plan with the patient. The patient was provided an  opportunity to ask questions and all were answered. The patient agreed with the plan and demonstrated an understanding of the instructions.   The patient was advised to call back or seek an in-person evaluation if the symptoms worsen or if the condition fails to improve as anticipated.  I provided 22 minutes of non-face-to-face time during this encounter.   Martyn Ehrich, NP

## 2018-12-26 NOTE — Patient Instructions (Signed)
Bronchoscopy showed mild generalized edema of the airways and no endobronchial lesion. Respiratory/AFB/fungal cultures from RML showed no growth and/or were negative. Pathology from RML and lymph node fine needle aspiration/washings showed no malignant cells.   Refer to ILD specialist Dr. Vaughan Browner or [facility] 2-4 weeks

## 2019-01-03 DIAGNOSIS — R0602 Shortness of breath: Secondary | ICD-10-CM | POA: Diagnosis not present

## 2019-01-03 DIAGNOSIS — J449 Chronic obstructive pulmonary disease, unspecified: Secondary | ICD-10-CM | POA: Diagnosis not present

## 2019-01-07 DIAGNOSIS — U071 COVID-19: Secondary | ICD-10-CM | POA: Diagnosis not present

## 2019-01-14 ENCOUNTER — Other Ambulatory Visit: Payer: Self-pay | Admitting: Cardiology

## 2019-01-17 LAB — FUNGAL ORGANISM REFLEX

## 2019-01-17 LAB — FUNGUS CULTURE WITH STAIN

## 2019-01-17 LAB — FUNGUS CULTURE RESULT

## 2019-01-19 DIAGNOSIS — J441 Chronic obstructive pulmonary disease with (acute) exacerbation: Secondary | ICD-10-CM | POA: Diagnosis not present

## 2019-01-28 ENCOUNTER — Ambulatory Visit: Payer: Medicare HMO | Admitting: Internal Medicine

## 2019-01-29 ENCOUNTER — Ambulatory Visit: Payer: Medicare HMO | Admitting: Internal Medicine

## 2019-02-01 LAB — ACID FAST CULTURE WITH REFLEXED SENSITIVITIES (MYCOBACTERIA)
Acid Fast Culture: NEGATIVE
Acid Fast Culture: NEGATIVE

## 2019-02-03 DIAGNOSIS — R0602 Shortness of breath: Secondary | ICD-10-CM | POA: Diagnosis not present

## 2019-02-03 DIAGNOSIS — J449 Chronic obstructive pulmonary disease, unspecified: Secondary | ICD-10-CM | POA: Diagnosis not present

## 2019-02-08 DIAGNOSIS — K219 Gastro-esophageal reflux disease without esophagitis: Secondary | ICD-10-CM | POA: Diagnosis not present

## 2019-02-08 DIAGNOSIS — E1129 Type 2 diabetes mellitus with other diabetic kidney complication: Secondary | ICD-10-CM | POA: Diagnosis not present

## 2019-02-08 DIAGNOSIS — J45901 Unspecified asthma with (acute) exacerbation: Secondary | ICD-10-CM | POA: Diagnosis not present

## 2019-02-08 DIAGNOSIS — E1165 Type 2 diabetes mellitus with hyperglycemia: Secondary | ICD-10-CM | POA: Diagnosis not present

## 2019-02-08 DIAGNOSIS — F331 Major depressive disorder, recurrent, moderate: Secondary | ICD-10-CM | POA: Diagnosis not present

## 2019-02-08 DIAGNOSIS — I482 Chronic atrial fibrillation, unspecified: Secondary | ICD-10-CM | POA: Diagnosis not present

## 2019-02-08 DIAGNOSIS — I10 Essential (primary) hypertension: Secondary | ICD-10-CM | POA: Diagnosis not present

## 2019-02-08 DIAGNOSIS — R600 Localized edema: Secondary | ICD-10-CM | POA: Diagnosis not present

## 2019-02-08 DIAGNOSIS — I4891 Unspecified atrial fibrillation: Secondary | ICD-10-CM | POA: Diagnosis not present

## 2019-02-13 ENCOUNTER — Other Ambulatory Visit: Payer: Self-pay | Admitting: "Endocrinology

## 2019-02-13 ENCOUNTER — Other Ambulatory Visit: Payer: Self-pay | Admitting: Cardiology

## 2019-02-14 ENCOUNTER — Other Ambulatory Visit: Payer: Self-pay

## 2019-02-14 ENCOUNTER — Encounter: Payer: Self-pay | Admitting: Internal Medicine

## 2019-02-14 ENCOUNTER — Telehealth: Payer: Self-pay | Admitting: Internal Medicine

## 2019-02-14 ENCOUNTER — Ambulatory Visit: Payer: Medicare HMO | Admitting: Internal Medicine

## 2019-02-14 VITALS — BP 134/76 | HR 84 | Temp 97.0°F | Ht 63.0 in | Wt 176.6 lb

## 2019-02-14 DIAGNOSIS — R918 Other nonspecific abnormal finding of lung field: Secondary | ICD-10-CM

## 2019-02-14 DIAGNOSIS — R590 Localized enlarged lymph nodes: Secondary | ICD-10-CM

## 2019-02-14 DIAGNOSIS — J849 Interstitial pulmonary disease, unspecified: Secondary | ICD-10-CM | POA: Diagnosis not present

## 2019-02-14 DIAGNOSIS — D649 Anemia, unspecified: Secondary | ICD-10-CM | POA: Diagnosis not present

## 2019-02-14 DIAGNOSIS — J9611 Chronic respiratory failure with hypoxia: Secondary | ICD-10-CM

## 2019-02-14 DIAGNOSIS — R5381 Other malaise: Secondary | ICD-10-CM

## 2019-02-14 LAB — BASIC METABOLIC PANEL
BUN: 27 mg/dL — ABNORMAL HIGH (ref 6–23)
CO2: 29 mEq/L (ref 19–32)
Calcium: 9.3 mg/dL (ref 8.4–10.5)
Chloride: 102 mEq/L (ref 96–112)
Creatinine, Ser: 1.4 mg/dL — ABNORMAL HIGH (ref 0.40–1.20)
GFR: 36.63 mL/min — ABNORMAL LOW (ref >60.00)
Glucose, Bld: 88 mg/dL (ref 70–99)
Potassium: 3.9 mEq/L (ref 3.5–5.1)
Sodium: 141 mEq/L (ref 135–145)

## 2019-02-14 LAB — CBC WITH DIFFERENTIAL/PLATELET
Basophils Absolute: 0 10*3/uL (ref 0.0–0.1)
Basophils Relative: 0.3 % (ref 0.0–3.0)
Eosinophils Absolute: 0.1 10*3/uL (ref 0.0–0.7)
Eosinophils Relative: 1.2 % (ref 0.0–5.0)
HCT: 34.2 % — ABNORMAL LOW (ref 36.0–46.0)
Hemoglobin: 10.9 g/dL — ABNORMAL LOW (ref 12.0–15.0)
Lymphocytes Relative: 12.7 % (ref 12.0–46.0)
Lymphs Abs: 0.8 10*3/uL (ref 0.7–4.0)
MCHC: 31.9 g/dL (ref 30.0–36.0)
MCV: 93.3 fl (ref 78.0–100.0)
Monocytes Absolute: 0.5 10*3/uL (ref 0.1–1.0)
Monocytes Relative: 7.1 % (ref 3.0–12.0)
Neutro Abs: 5 10*3/uL (ref 1.4–7.7)
Neutrophils Relative %: 78.7 % — ABNORMAL HIGH (ref 43.0–77.0)
Platelets: 169 10*3/uL (ref 150.0–400.0)
RBC: 3.67 Mil/uL — ABNORMAL LOW (ref 3.87–5.11)
RDW: 16.2 % — ABNORMAL HIGH (ref 11.5–15.5)
WBC: 6.4 10*3/uL (ref 4.0–10.5)

## 2019-02-14 LAB — PROTIME-INR
INR: 2.3 ratio — ABNORMAL HIGH (ref 0.8–1.0)
Prothrombin Time: 25.7 s — ABNORMAL HIGH (ref 9.6–13.1)

## 2019-02-14 NOTE — Telephone Encounter (Signed)
We did have an opening at our office 2/26 for pft that we could get pt scheduled there. Called and spoke with pt to see if she would like to come to our office for the pft or if she would rather have it done at [facility] since it would be closer to home and pt stated she would rather have it performed at [facility]. I stated to pt that she would have to be covid tested prior to the PFT and she verbalized understanding. PFT is just a 54mn PFT.  [facility]

## 2019-02-14 NOTE — Telephone Encounter (Signed)
[name]/[name]  See if patient Ellen Jordan can get spiro/dlco at [facility] (her add [name] [id number] ) before coming to see me 03/18/2019  THanks    SIGNATURE    Dr. Brand Males, M.D., F.C.C.P,  Pulmonary and Critical Care Medicine Staff Physician, [place] Director - Interstitial Lung Disease  Program  [facility] at [place], [place], [address]  Pager: [phone], If no answer or between  15:00h - 7:00h: call [phone] Telephone: [phone]  2:56 PM 02/14/2019

## 2019-02-14 NOTE — Progress Notes (Signed)
Oct 2020 - IOV wuith DR Margy Clarks History as below: Suspicion here is some combination of COPD and medication-associated ILD.  Rheum workup ordered last visit unremarkable (ANA, ANCA, ESR, RF).  BNP neg.  Gave trial of breo and ordered PFTs and repeat CT also.  PFTs with moderate restriction, preserved ratio.  DLCO markedly reduced but corrects for alveolar ventilation.  CT chest not yet done  Echo with mitral stenosis earlier this year not quantified, TEE 2017 with mild mitral disease, BNP neg last visit  A1c 6.4%, on a good deal of insulin  This is a 74 year old with a history of oxygen dependent ?COPD who is presenting for evaluation of pulmonary hypertension.  She was actually following with a Dr. Luan Pulling in [place].  A CT scan ordered by Dr. Luan Pulling revealed numerous subcentimeter pulmonary nodules with a question of MAC.  She underwent a bronchoscopy with BAL which did not show evidence of MAC.  At some point during her work-up she had an echocardiogram that showed pulmonary hypertension with an RVSP of 59.  She presents to pulmonary clinic for a second opinion.  Her dyspnea progressed over past year.  She was previously MMRC 0-1 on RA.  Now MMRC 1-2 on 2L O2.  Without O2 she quickly feels fatigued and oddly also gets back pains.  There is occasional wheezing but not consistently.  She has a runny nose but this has improved since she started O2.  Her cardiac history is a little unusual too.  She has refractory symptomatic paroxysmal afib s/p failed cardioversion now on flecainide that was titrated up around Sept 2019.  Comorbidities include paroxysmal atrial fibrillation on anticoagulation, diabetes.  Her home inhaler regimen seems to only be duo nebs PRN which work for a short while.  She has a father who had emphysema.  Patient has a 50-pack-year smoking history quit 13 years ago.  She may have had some asbestos exposure in distant past but otherwise no concerning exposure history.   Used to work as Consulting civil engineer.  Has worsening fatigue and DOE since we last met.  Also worsening itchy plaques have erupted on body with one concerning nonhealing one on finger as well.  ROS + symptoms in bold Fatigue, malaise Fevers, chills, weight loss Nausea, vomiting, diarrhea Shortness of breath, wheezing, cough Chest pain, palpitations, lower ext edema Rash, concerning lesions    BRONCH 02/07/2018 Samples: 1. Wang needle biopsies from subcarinal (7) node 2. Wang needle biopsies from right pretracheal (4R) node 3. BAL RML 4. Brushing RML   12/26/2018 - video visit with Derl Barrow Patient contacted today for bronchoscopy follow-up/ persistent adenopathy and infiltrates of unclear origin. Underwent flexible video bronchoscopy with EBUS and nde biopsies on 12/9 with Dr. Tamala Julian. Bronchoscopy showed mild generalized edema of the airways and no endobronchial lesion. Respiratory/AFB/fungal cultures from RML showed no growth and/or were negative. Pathology from RML and lymph node fine needle aspiration/washings showed no malignant cells.   Feeling about the same. Not able to walk far d/t fatigue and dyspnea. Out of breath with 2L oxygen. She is compliant with Breo inhaler, uses nebulizer approx 4 times a week with some temporary improvement. Reports occasional wheezing, no cough. No weight loss. She is back on flecainide, trial off medication with no noticeable improvement in her breathing. No fev    OV 02/14/2019  - ILD center visit  Subjective:  Patient ID: Ellen Jordan, female , DOB: [birth year] , age 51 y.o. , MRN: [id number] ,  ADDRESS: [place] [address]   02/14/2019 -   Chief Complaint  Patient presents with  . Follow-up    Abnormal CT of the chest    HPI Ellen Jordan 74 y.o. -has been referred to the ILD center for evaluation of interstitial lung disease that showed up on a CT scan of the chest November 2020.  History is gained from review of the chart,  talking to the patient.  According to the patient she was in her usual state of health then in March 2020 or shows noticed insidious onset of shortness of breath walking to the mailbox and back.  She saw Dr. Sinda Du at [facility] is now retired] and was placed on oxygen.  Since then her shortness of breath is better but still class III dyspnea relieved by rest.  Still needing oxygen.  She is not able to do much around the house.  However she says things have not progressed and she is stable with oxygen.  High-resolution CT scan of the chest showed bulky mediastinal adenopathy and also interstitial lung disease not otherwise specified with pulmonary nodules.  In December 2020 she underwent bronchoscopy with lavage that ruled out AFB.  She underwent endobronchial ultrasound of the lymph nodes that did not show any malignant cells.  However I do not see cell count from the interstitial lung disease lavage.  Neither do I see transbronchial biopsies for any genomic analysis.  She is somewhat of a high risk candidate on anticoagulation.  She also has diastolic dysfunction and elevated pulmonary artery pressures.  Her limited autoimmune work-up was negative.  She has been referred to the interstitial lung disease center now.  She was given the ILD questionnaire but she did not fill it and left it at home.  She says fatigue and deconditioning and memory issues just overwhelm her.  She lives with her husband a businessman   Current ECOG 3-4  Despiet fatigue and saying memory issues - she answered well on questionniare below  [facility] ILD Questionnaire  Symptoms:  - insidious onset, worse, x 2 years (different information from verbal). Episodic dysncea +. Curent severit below. Has cough that is severe. strted on 2020. Stable since onset. Coughs at night +. Mild yellow sputum +, Cough worse when lie down. Does clear throat. Feels a tickle in throat. Does have chest tightness    SYMPTOM SCALE - ILD 02/14/2019   O2 use 2L   Shortness of Breath 0 -> 5 scale with 5 being worst (score 6 If unable to do)  At rest 1  Simple tasks - showers, clothes change, eating, shaving 5  Household (dishes, doing bed, laundry) 5  Shopping 5  Walking level at own pace 5  Walking up Stairs 5  Total (30-36) Dyspnea Score 31  How bad is your cough? 5  How bad is your fatigue yes  How bad is nausea 0  How bad is vomiting?  0  How bad is diarrhea? 0  How bad is anxiety? x  How bad is depression x       Past Medical History :  teype 2 DM. Hx of pneumonia + few years. Heart Disease NOS +   ROS:  - fatigue +  Few years, arthralgia x several mnths, dry eyes  - few months, GERD +. RASH NOS x few yea   FAMILY HISTORY of LUNG DISEASE:  Copd nos. Denies PF   EXPOSURE HISTORY:   Stopped smoking  x 2000. Smoked for 20 yeas . Denies cocaine, IVD, MJ or vaping   HOME and HOBBY DETAILS :  [place] . Home is 21 years. Home is damp. There eis mildey and mold . She uses humidifier. 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  Severe restriction with reduction diffusion capacity to 90% she has diastolic dysfunction..    Simple office walk 185 feet x  3 laps goal with forehead probe 02/14/2019   O2 used 2L   Number laps completed Of 3 - stopped 3 times due to dizziness and fatigue for 1 lap and needed wheel chair  Comments about pace Super slow  Resting Pulse Ox/HR 95% and 111/min  Final Pulse Ox/HR 91% and 107 /min - lowest pulse ox but variable  Desaturated </= 88% no  Desaturated <= 3% points Yes  Got Tachycardic >/= 90/min yes  Symptoms at end of test Too  decodnioned  Miscellaneous comments x       Results for IDALIS, HOELTING (MRN [id number]) as of 02/14/2019 09:11  Ref. Range 09/12/2018 12:01  Anti Nuclear Antibody (ANA) Latest Ref Range: NEGATIVE  NEGATIVE  Cyclic Citrullin Peptide Ab Latest Units: UNITS <16  RA Latex Turbid. Latest Ref Range: <14 IU/mL <14  ANA,IFA RA DIAG PNL W/RFLX TIT/PATN Unknown Rpt   BAL 1/02/18/2018  - no AFB on culture  IMPRESSION: Personally visualized this high-resolution CT chest from December 05, 2018.  And personally interpreted and agree with the findings below. 1. Bulky mediastinal adenopathy as described. Lymphoproliferative disorder or metastatic process and lymphangitic carcinomatosis is not excluded. Sarcoidosis is considered in the differential based on the presence of adenopathy, potential interstitial lung disease with signs of nodular septal thickening and fissural thickening. Nodal biopsy may be warranted. 2. New areas of its tree-in-bud opacity and ill-defined nodularity in the inferior right upper lobe and in the right middle lobe. Findings raising the question atypical infection. 3. Multiple pulmonary nodules are not changed. Continued follow-up based on the largest nodule in the right upper lobe is suggested at 18 months following the scan from July of 2020; however, follow-up may occur sooner based on other findings described above it has. 4. Signs of cirrhosis. 5. Aortic atherosclerosis. Coronary artery calcifications. An element of the septal thickening could be due to pulmonary edema as well, though areas appear more irregular and slightly nodular as described. 6. Small pericardial effusion is unchanged, now associated with trace bilateral pleural effusions. 7. These results will be called to the ordering clinician or representative by the Radiologist Assistant, and communication documented in the PACS or zVision Dashboard.  Aortic Atherosclerosis  (ICD10-I70.0). Ortho:  Electronically Signed   By: Zetta Bills M.D.   On: 12/05/2018 08:17   Results for JAREN, KEARN (MRN [id number]) as of 02/14/2019 09:11 -personally visualized and interpreted this pulmonary function test that shows restriction along with reduced diffusion capacity suggestive of ILD.  In addition there is postbronchodilator FEV1 change of 15%.  Ref. Range 11/06/2018 13:51  FVC-Pre Latest Units: L 1.12  FVC-%Pred-Pre Latest Units: % 41  FEV1-Pre Latest Units: L 0.84  FEV1-%Pred-Pre Latest Units: % 41  Pre FEV1/FVC ratio Latest Units: % 75  Results for KOBI, ALLER (MRN [id number]) as of 02/14/2019 09:11  Ref. Range 11/06/2018 13:51  TLC Latest Units: L 3.66  TLC % pred Latest Units: % 74  Results for IVYONNA, HOELZEL (MRN [id number]) as of 02/14/2019 09:11  Ref. Range 11/06/2018 13:51  DLCO cor Latest Units: ml/min/mmHg 10.43  DLCO cor % pred Latest Units: % 56    Ehocardiogram February 2020 shows left ventricular ejection fraction 65% with impaired diastolic dysfunction   Blood work December 2020 shows hemoglobin 9.4 g% and a creatinine of 1.07 mg percent.  She is anemic.  But the 2 g drop compared to September 2020  ROS - per HPI     has a past medical history of Allergic rhinitis, Allergy, Anxiety, Atrial fibrillation (HCC), Carpal tunnel syndrome, Cataract, Depression, Diabetic neuropathy ([place]), Dyspnea, Essential hypertension, GERD (gastroesophageal reflux disease), Mixed hyperlipidemia, Normocytic anemia (12/18/2018), PAF (paroxysmal atrial fibrillation) ([place]), Pneumonia, PONV (postoperative nausea and vomiting), RBBB, Restless leg syndrome, Type 2 diabetes mellitus ([place]), and Uterine cancer ([place]).   reports that she quit smoking about 14 years ago. Her smoking use included cigarettes. She started smoking about 58 years ago. She has a 50.00 pack-year smoking history. She quit smokeless tobacco use about 14 years ago.  Past Surgical History:  Procedure  Laterality Date  . ABDOMINAL HYSTERECTOMY     uterine cancer  . BACK SURGERY    . CARDIOVERSION N/A 03/11/2015   Procedure: CARDIOVERSION;  Surgeon: [name], MD;  Location: [facility];  Service: Cardiovascular;  Laterality: N/A;  . CHOLECYSTECTOMY    . EYE SURGERY     cataracts  . FLEXIBLE BRONCHOSCOPY N/A 07/30/2018   Procedure: FLEXIBLE BRONCHOSCOPY;  Surgeon: Sinda Du, MD;  [facility];  Service: Cardiopulmonary;  Laterality: N/A;  . SPINE SURGERY    . TEE WITHOUT CARDIOVERSION N/A 03/11/2015   Procedure: TRANSESOPHAGEAL ECHOCARDIOGRAM (TEE);  Surgeon: [name], MD;  Location: [facility];  Service: Cardiovascular;  Laterality: N/A;  . VIDEO BRONCHOSCOPY WITH ENDOBRONCHIAL ULTRASOUND N/A 12/19/2018   Procedure: VIDEO BRONCHOSCOPY WITH ENDOBRONCHIAL ULTRASOUND;  Surgeon: [name], MD;  Location[facility] OR;  Service: Thoracic;  Laterality: N/A;    Allergies  Allergen Reactions  . Penicillins Hives    Has patient had a PCN reaction causing immediate rash, facial/tongue/throat swelling, SOB or lightheadedness with hypotension: Yes Has patient had a PCN reaction causing severe rash involving mucus membranes or skin necrosis: No Has patient had a PCN reaction that required hospitalization No Has patient had a PCN reaction occurring within the last 10 years: No If all of the above answers are "NO", then may proceed with Cephalosporin use.     Immunization History  Administered Date(s) Administered  . Influenza, High Dose Seasonal PF 11/14/2018  . Influenza,inj,Quad PF,6+ Mos 11/04/2016  . Pneumococcal Conjugate-13 11/30/2016  . Pneumococcal Polysaccharide-23 06/07/2017  . Td 06/07/2017    Family History  Problem Relation Age of Onset  . Heart disease Mother 10  . Hyperlipidemia Mother   . Diabetes Brother   . COPD Father 62       emphysema  . Hypertension Father   . Hyperlipidemia Father   . Diabetes Daughter   . AAA (abdominal aortic  aneurysm) Son   . Diabetes Brother   . Diabetes Brother   . Diabetes Brother      Current Outpatient Medications:  .  Accu-Chek FastClix Lancets MISC, USE TO TEST BLOOD GLUCOSE FOUR TIMES DAILY, Disp: 408 each, Rfl:  2 .  ACCU-CHEK GUIDE test strip, USE AS INSTRUCTED FOUR TIMES DAILY, Disp: 350 strip, Rfl: 1 .  ACCU-CHEK SMARTVIEW test strip, CHECK BLOOD GLUCOSE FOUR TIMES DAILY, Disp: 400 each, Rfl: 2 .  albuterol (PROVENTIL HFA;VENTOLIN HFA) 108 (90 Base) MCG/ACT inhaler, Inhale 2 puffs into the lungs every 6 (six) hours as needed for wheezing or shortness of breath., Disp: 1 Inhaler, Rfl: 0 .  Alcohol Swabs (B-D SINGLE USE SWABS REGULAR) PADS, USE FOUR TIMES DAILY, Disp: 400 each, Rfl: 2 .  atenolol (TENORMIN) 50 MG tablet, TAKE 1 TABLET EVERY DAY, Disp: 90 tablet, Rfl: 0 .  atorvastatin (LIPITOR) 40 MG tablet, TAKE 1 TABLET EVERY DAY, Disp: 90 tablet, Rfl: 3 .  b complex vitamins tablet, Take 1 tablet by mouth daily., Disp: , Rfl:  .  Biotin 5000 MCG CAPS, Take 1 capsule by mouth daily., Disp: , Rfl:  .  Blood Glucose Monitoring Suppl (ACCU-CHEK GUIDE) w/Device KIT, 1 each by Does not apply route 4 (four) times daily., Disp: 1 kit, Rfl: 0 .  Calcium-Phosphorus-Vitamin D (CALCIUM GUMMIES PO), Take 1 tablet by mouth daily. [phone], Disp: , Rfl:  .  chlorpheniramine (EQ CHLORTABS) 4 MG tablet, Take 4 mg by mouth 2 (two) times daily as needed for allergies., Disp: , Rfl:  .  cholecalciferol (VITAMIN D) 1000 units tablet, Take 1,000 Units by mouth daily., Disp: , Rfl:  .  diltiazem (CARDIZEM CD) 180 MG 24 hr capsule, TAKE 1 CAPSULE EVERY DAY (DOSE INCREASED 01/04/18), Disp: 90 capsule, Rfl: 3 .  diphenhydrAMINE (BENADRYL) 25 MG tablet, Take 25 mg by mouth daily., Disp: , Rfl:  .  ELIQUIS 5 MG TABS tablet, TAKE 1 TABLET TWICE DAILY, Disp: 180 tablet, Rfl: 3 .  flecainide (TAMBOCOR) 100 MG tablet, TAKE 1 TABLET TWICE DAILY ( DOSE INCREASE ), Disp: 180 tablet, Rfl: 3 .  fluticasone  furoate-vilanterol (BREO ELLIPTA) 100-25 MCG/INH AEPB, Inhale 1 puff into the lungs daily., Disp: 2 each, Rfl: 0 .  glucose blood (ACCU-CHEK GUIDE) test strip, Use as instructed 4 x daily, Disp: 400 each, Rfl: 1 .  insulin aspart (NOVOLOG) 100 UNIT/ML injection, Inject 10-16 Units into the skin 3 (three) times daily with meals., Disp: 60 mL, Rfl: 2 .  insulin detemir (LEVEMIR) 100 UNIT/ML injection, Inject 0.5 mLs (50 Units total) into the skin at bedtime., Disp: 60 mL, Rfl: 1 .  ipratropium-albuterol (DUONEB) 0.5-2.5 (3) MG/3ML SOLN, Take 3 mLs by nebulization every 6 (six) hours as needed (wheezing/ shortness of breath)., Disp: , Rfl:  .  liraglutide (VICTOZA) 18 MG/3ML SOPN, Inject 0.3 mLs (1.8 mg total) into the skin daily., Disp: 27 mL, Rfl: 1 .  lisinopril (PRINIVIL,ZESTRIL) 20 MG tablet, TAKE 1 TABLET (20 MG TOTAL) BY MOUTH DAILY. (Patient taking differently: Take 10 mg by mouth daily. Patient is currently taking a 1/2 tablet due to low BP), Disp: 90 tablet, Rfl: 0 .  Misc Natural Products (ESTROVEN + ENERGY MAX STRENGTH) TABS, Take 1 tablet by mouth daily., Disp: , Rfl:  .  Multiple Vitamin (MULTIVITAMIN) tablet, Take 1 tablet by mouth daily., Disp: , Rfl:  .  Omega-3 Fatty Acids (FISH OIL) 1200 MG CAPS, Take 1 capsule by mouth daily., Disp: , Rfl:  .  omeprazole (PRILOSEC) 20 MG capsule, TAKE 1 CAPSULE EVERY DAY, Disp: 90 capsule, Rfl: 1 .  sertraline (ZOLOFT) 100 MG tablet, TAKE 1 TABLET (100 MG TOTAL) BY MOUTH DAILY., Disp: 90 tablet, Rfl: 0 .  torsemide (DEMADEX) 20 MG  tablet, Take 20 mg by mouth daily., Disp: , Rfl:  .  vitamin E (VITAMIN E) 400 UNIT capsule, Take 400 Units by mouth daily., Disp: , Rfl:  No current facility-administered medications for this visit.  Facility-Administered Medications Ordered in Other Visits:  .  lactated ringers infusion, , Intravenous, Continuous PRN, Kathryne Hitch, CRNA, New Bag at 12/18/18 1133      Objective:   Vitals:   02/14/19 0905   BP: 134/76  Pulse: 84  Temp: (!) 97 F (36.1 C)  SpO2: 100%  Weight: 176 lb 9.6 oz (80.1 kg)  Height: _0  (1.6 m)    Estimated body mass index is 31.28 kg/m as calculated from the following:   Height as of this encounter: _1  (1.6 m).   Weight as of this encounter: 176 lb 9.6 oz (80.1 kg).  _2 @  [employer]   02/14/19 0905  Weight: 176 lb 9.6 oz (80.1 kg)     Physical Exam  Is a deconditioned female sitting on the wheelchair.  She has some mild pedal edema.  She has irregular heartbeats.  No clear crackles although in the lung base there is some crackles I think.  No elevated JVP.  She seems overwhelmed with information being given to her.  She definitely looks deconditioned  Somewhat pale  STIGMATA of CONNECTIVE TISSUE DISEASE  - Distal digital fissuring (ie, "mechanic hands") - no - Distal digital tip ulceration - no -Inflammatory arthritis or polyarticular morning joint stiffness ?60 minutes - n - Palmar telangiectasia - no - Raynaud phenomenon - no - Unexplained digital edema - no - Unexplained fixed rash on the digital extensor surfaces (Gottron's sign) - no ... - Deformities of RA - no - Scleroderma  - no - Malar Rash -  no       Assessment:       ICD-10-CM   1. Chronic respiratory failure with hypoxia (HCC)  J96.11 ANA+ENA+DNA/DS+Scl 70+SjoSSA/B    Angiotensin converting enzyme    CK (Creatine Kinase)    Myositis Assessr Plus Jo-1 Autoabs    Hypersensitivity Pneumonitis    Hypersensitivity Pneumonitis    Myositis Assessr Plus Jo-1 Autoabs    CK (Creatine Kinase)    Angiotensin converting enzyme    ANA+ENA+DNA/DS+Scl 31+RXYVOP/F    Basic Metabolic Panel (BMET)    CBC w/Diff    INR/PT    CBC w/Diff    INR/PT    Basic Metabolic Panel (BMET)    CT CHEST HIGH RESOLUTION    CANCELED: CBC w/Diff    CANCELED: Basic Metabolic Panel (BMET)    CANCELED: INR/PT    CANCELED: CT Chest High Resolution    CANCELED: CT Chest High Resolution     CANCELED: INR/PT    CANCELED: CBC w/Diff    CANCELED: Basic Metabolic Panel (BMET)  2. ILD (interstitial lung disease) (HCC)  J84.9 ANA+ENA+DNA/DS+Scl 70+SjoSSA/B    Angiotensin converting enzyme    CK (Creatine Kinase)    Myositis Assessr Plus Jo-1 Autoabs    Hypersensitivity Pneumonitis    Hypersensitivity Pneumonitis    Myositis Assessr Plus Jo-1 Autoabs    CK (Creatine Kinase)    Angiotensin converting enzyme    ANA+ENA+DNA/DS+Scl 29+WKMQKM/M    Basic Metabolic Panel (BMET)    CBC w/Diff    INR/PT    CBC w/Diff    INR/PT    Basic Metabolic Panel (BMET)    CT CHEST HIGH RESOLUTION    CANCELED: CBC w/Diff    CANCELED: Basic  Metabolic Panel (BMET)    CANCELED: INR/PT    CANCELED: CT Chest High Resolution    CANCELED: CT Chest High Resolution    CANCELED: INR/PT    CANCELED: CBC w/Diff    CANCELED: Basic Metabolic Panel (BMET)  3. Mediastinal adenopathy  R59.0 ANA+ENA+DNA/DS+Scl 70+SjoSSA/B    Angiotensin converting enzyme    CK (Creatine Kinase)    Myositis Assessr Plus Jo-1 Autoabs    Hypersensitivity Pneumonitis    Hypersensitivity Pneumonitis    Myositis Assessr Plus Jo-1 Autoabs    CK (Creatine Kinase)    Angiotensin converting enzyme    ANA+ENA+DNA/DS+Scl 95+MWUXLK/G    Basic Metabolic Panel (BMET)    CBC w/Diff    INR/PT    CBC w/Diff    INR/PT    Basic Metabolic Panel (BMET)    CT CHEST HIGH RESOLUTION    CANCELED: CBC w/Diff    CANCELED: Basic Metabolic Panel (BMET)    CANCELED: INR/PT    CANCELED: CT Chest High Resolution    CANCELED: CT Chest High Resolution    CANCELED: INR/PT    CANCELED: CBC w/Diff    CANCELED: Basic Metabolic Panel (BMET)  4. Physical deconditioning  R53.81   5. Lung nodule, multiple  R91.8   6. Anemia, unspecified type  D64.9    She has physical deconditioning way out of proportion to her interstitial lung disease and mediastinal adenopathy.  She stopped multiple times walking even though she did not desaturate on the 2 L.   She looked a little bit pale and I wonder if there is anemia going on that is worse.  Clearly last month it was worse.   In terms of interstitial lung disease the major history of a getting here is 1 of mold exposure and damp environment making her at risk candidate for hypersensitive pneumonitis but the previous high-resolution CT chest was not consistent with hypersensitive pneumonitis.  We do not have the benefit of bronchoalveolar lavage or transbronchial biopsy to help [place] with interstitial lung disease  She also has mediastinal adenopathy which is nondiagnostic on the endobronchial ultrasound biopsy.  Probably low probability for malignancy.?  Related versus unrelated interstitial lung diseases.  If related sarcoidosis would be a common etiology.  It might well be that she has 2 different problems  Dyspnea itself is multifactorial and is due to deconditioning, diastolic dysfunction ILD.  At this point in time visit is to restage and reevaluate.  We will also get pulmonary function testing.    Plan:     Patient Instructions     ICD-10-CM   1. Chronic respiratory failure with hypoxia (HCC)  J96.11   2. ILD (interstitial lung disease) ([place])  J84.9   3. Mediastinal adenopathy  R59.0   4. Physical deconditioning  R53.81   5. Lung nodule, multiple  R91.8   6. Anemia, unspecified type  D64.9      Do ILD questionnaire here 02/14/2019 Do walk test on 2L [place] here 02/14/2019 Do blood work    - CBC. BMET, INR, DS-DNA, ssa/ssb, scl--70, ACE, CK, JO-1, hypersensitivity pneumonitis profile Do HRCT supine and prone - [facility] few weeks at [facility] Stop fish oil - makes acid reflux worse and can worsen lungs. Does not help heart Continue o2  Followup - 4 weeks with Dr Chase Caller for face to face to discuss results   - book in [facility] clinic if needed; 15 min slot ok  - bring husband    ( Level 05 visit: Estb 40-54  min  in  visit type: on-site physical face to visit  in total care time and counseling  or/and coordination of care by this undersigned MD - Dr Brand Males. This includes one or more of the following on this same day 02/14/2019: pre-charting, chart review, note writing, documentation discussion of test results, diagnostic or treatment recommendations, prognosis, risks and benefits of management options, instructions, education, compliance or risk-factor reduction. It excludes time spent by the Benjamin Perez or office staff in the care of the patient. Actual time 28 min)    SIGNATURE    Dr. Brand Males, M.D., F.C.C.P,  Pulmonary and Critical Care Medicine Staff Physician, [place] Director - Interstitial Lung Disease  Program  Pulmonary [place] at [place], [place], [address]  Pager: [phone], If no answer or between  15:00h - 7:00h: call [phone] Telephone: [phone]  9:52 AM 02/14/2019

## 2019-02-14 NOTE — Patient Instructions (Addendum)
ICD-10-CM   1. Chronic respiratory failure with hypoxia (HCC)  J96.11   2. ILD (interstitial lung disease) ([place])  J84.9   3. Mediastinal adenopathy  R59.0   4. Physical deconditioning  R53.81   5. Lung nodule, multiple  R91.8   6. Anemia, unspecified type  D64.9      Do ILD questionnaire here 02/14/2019 Do walk test on 2L [place] here 02/14/2019 Do blood work    - CBC. BMET, INR, DS-DNA, ssa/ssb, scl--70, ACE, CK, JO-1, hypersensitivity pneumonitis profile Do HRCT supine and prone - next few weeks at [facility] Stop fish oil - makes acid reflux worse and can worsen lungs. Does not help heart Continue o2 Get pulmonary function testing before visit  Followup - 4 weeks with Dr Chase Caller for face to face to discuss results   - book in [facility] if needed; 15 min slot ok  - bring husband   -

## 2019-02-15 NOTE — Telephone Encounter (Signed)
Per Parke Poisson, CMA PFTs are not being performed at the hospitals at this time due to Covid-19. Pt will have to have PFT performed at our office prior to next OV. I have placed a new order.  Also, since pt was diagnosed with covid around Christmas 2020, pt cannot have a covid test nor can she have PFT until after her covid test so those appts will have to be rescheduled as well as f/u with MR due to MR wanting to see pt after tests.  Called and spoke with pt letting her know all this and pt verbalized understanding. Pt has been scheduled to have covid test 3/29 at [facility], PFT 4/1, and f/u OV with Aaron Edelman 4/1 following PFT. Nothing further needed.

## 2019-02-19 ENCOUNTER — Encounter: Payer: Self-pay | Admitting: "Endocrinology

## 2019-02-19 ENCOUNTER — Ambulatory Visit (INDEPENDENT_AMBULATORY_CARE_PROVIDER_SITE_OTHER): Payer: Medicare HMO | Admitting: "Endocrinology

## 2019-02-19 DIAGNOSIS — J441 Chronic obstructive pulmonary disease with (acute) exacerbation: Secondary | ICD-10-CM | POA: Diagnosis not present

## 2019-02-19 DIAGNOSIS — E1165 Type 2 diabetes mellitus with hyperglycemia: Secondary | ICD-10-CM | POA: Diagnosis not present

## 2019-02-19 DIAGNOSIS — I10 Essential (primary) hypertension: Secondary | ICD-10-CM

## 2019-02-19 DIAGNOSIS — E782 Mixed hyperlipidemia: Secondary | ICD-10-CM | POA: Diagnosis not present

## 2019-02-19 LAB — HYPERSENSITIVITY PNEUMONITIS
A. Pullulans Abs: NEGATIVE
A.Fumigatus #1 Abs: NEGATIVE
Micropolyspora faeni, IgG: NEGATIVE
Pigeon Serum Abs: NEGATIVE
Thermoact. Saccharii: NEGATIVE
Thermoactinomyces vulgaris, IgG: NEGATIVE

## 2019-02-19 LAB — BASIC METABOLIC PANEL
BUN/Creatinine Ratio: 16 (ref 12–28)
BUN: 25 mg/dL (ref 8–27)
CO2: 25 mmol/L (ref 20–29)
Calcium: 9.1 mg/dL (ref 8.7–10.3)
Chloride: 102 mmol/L (ref 96–106)
Creatinine, Ser: 1.53 mg/dL — ABNORMAL HIGH (ref 0.57–1.00)
GFR calc Af Amer: 38 mL/min/{1.73_m2} — ABNORMAL LOW (ref >59)
GFR calc non Af Amer: 33 mL/min/{1.73_m2} — ABNORMAL LOW (ref >59)
Glucose: 89 mg/dL (ref 65–99)
Potassium: 4.1 mmol/L (ref 3.5–5.2)
Sodium: 143 mmol/L (ref 134–144)

## 2019-02-19 LAB — FANA STAINING PATTERNS: Speckled Pattern: 1:80 {titer}

## 2019-02-19 LAB — CK: Total CK: 54 U/L (ref 32–182)

## 2019-02-19 LAB — ANA+ENA+DNA/DS+SCL 70+SJOSSA/B
ANA Titer 1: POSITIVE — AB
ENA RNP Ab: 3.5 AI — ABNORMAL HIGH (ref 0.0–0.9)
ENA SM Ab Ser-aCnc: <0.2 AI (ref 0.0–0.9)
ENA SSA (RO) Ab: <0.2 AI (ref 0.0–0.9)
ENA SSB (LA) Ab: <0.2 AI (ref 0.0–0.9)
Scleroderma (Scl-70) (ENA) Antibody, IgG: <0.2 AI (ref 0.0–0.9)
dsDNA Ab: <1 IU/mL (ref 0–9)

## 2019-02-19 LAB — CBC WITH DIFFERENTIAL/PLATELET

## 2019-02-19 LAB — PROTIME-INR

## 2019-02-19 LAB — ANGIOTENSIN CONVERTING ENZYME: Angio Convert Enzyme: 8 U/L — ABNORMAL LOW (ref 14–82)

## 2019-02-19 MED ORDER — FREESTYLE LIBRE 14 DAY SENSOR MISC: 1.0000 | 2 refills | Status: DC

## 2019-02-19 MED ORDER — FREESTYLE LIBRE 14 DAY READER DEVI: 1.0000 | Freq: Once | 0 refills | Status: AC

## 2019-02-19 NOTE — Patient Instructions (Signed)
                                     Advice for Weight Management  -For most of us the best way to lose weight is by diet management. Generally speaking, diet management means consuming less calories intentionally which over time brings about progressive weight loss.  This can be achieved more effectively by restricting carbohydrate consumption to the minimum possible.  So, it is critically important to know your numbers: how much calorie you are consuming and how much calorie you need. More importantly, our carbohydrates sources should be unprocessed or minimally processed complex starch food items.   Sometimes, it is important to balance nutrition by increasing protein intake (animal or plant source), fruits, and vegetables.  -Sticking to a routine mealtime to eat 3 meals a day and avoiding unnecessary snacks is shown to have a big role in weight control. Under normal circumstances, the only time we lose real weight is when we are hungry, so allow hunger to take place- hunger means no food between meal times, only water.  It is not advisable to starve.   -It is better to avoid simple carbohydrates including: Cakes, Sweet Desserts, Ice Cream, Soda (diet and regular), Sweet Tea, Candies, Chips, Cookies, Store Bought Juices, Alcohol in Excess of  1-2 drinks a day, Artificial Sweeteners, Doughnuts, Coffee Creamers, "Sugar-free" Products, etc, etc.  This is not a complete list.....    -Consulting with certified diabetes educators is proven to provide you with the most accurate and current information on diet.  Also, you may be  interested in discussing diet options/exchanges , we can schedule a visit with Penny Crumpton, RDN, CDE for individualized nutrition education.  -Exercise: If you are able: 30 -60 minutes a day ,4 days a week, or 150 minutes a week.  The longer the better.  Combine stretch, strength, and aerobic activities.  If you were told in the past that you  have high risk for cardiovascular diseases, you may seek evaluation by your heart doctor prior to initiating moderate to intense exercise programs.                                  Additional Care Considerations for Diabetes   -Diabetes  is a chronic disease.  The most important care consideration is regular follow-up with your diabetes care provider with the goal being avoiding or delaying its complications and to take advantage of advances in medications and technology.    -Type 2 diabetes is known to coexist with other important comorbidities such as high blood pressure and high cholesterol.  It is critical to control not only the diabetes but also the high blood pressure and high cholesterol to minimize and delay the risk of complications including coronary artery disease, stroke, amputations, blindness, etc.    - Studies showed that people with diabetes will benefit from a class of medications known as ACE inhibitors and statins.  Unless there are specific reasons not to be on these medications, the standard of care is to consider getting one from these groups of medications at an optimal doses.  These medications are generally considered safe and proven to help protect the heart and the kidneys.    - People with diabetes are encouraged to initiate and maintain regular follow-up with eye doctors, foot doctors, dentists ,   and if necessary heart and kidney doctors.     - It is highly recommended that people with diabetes quit smoking or stay away from smoking, and get yearly  flu vaccine and pneumonia vaccine at least every 5 years.  One other important lifestyle recommendation is to ensure adequate sleep - at least 6-7 hours of uninterrupted sleep at night.  -Exercise: If you are able: 30 -60 minutes a day, 4 days a week, or 150 minutes a week.  The longer the better.  Combine stretch, strength, and aerobic activities.  If you were told in the past that you have high risk for cardiovascular  diseases, you may seek evaluation by your heart doctor prior to initiating moderate to intense exercise programs.     COVID-19 Vaccine Information can be found at: [email or web address] For questions related to vaccine distribution or appointments, please email [email or web address] or call [phone].

## 2019-02-19 NOTE — Progress Notes (Signed)
02/19/2019                                                    Endocrinology Telehealth Visit Follow up Note -During COVID -19 Pandemic  This visit type was conducted due to national recommendations for restrictions regarding the COVID-19 Pandemic  in an effort to limit this patient's exposure and mitigate transmission of the corona virus.  Due to her co-morbid illnesses, Ellen Jordan is at  moderate to high risk for complications without adequate follow up.  This format is felt to be most appropriate for her at this time.  I connected with this patient on 02/19/2019   by telephone and verified that I am speaking with the correct person using two identifiers. Ellen Jordan, February 18, 1945. she has verbally consented to this visit. All issues noted in this document were discussed and addressed. The format was not optimal for physical exam.     Subjective:    Patient ID: Ellen Jordan, female    DOB: [birth year], PCP [name], MD   Past Medical History:  Diagnosis Date  . Allergic rhinitis   . Allergy   . Anxiety   . Atrial fibrillation ([place])   . Carpal tunnel syndrome    left  . Cataract   . Depression   . Diabetic neuropathy ([place])   . Dyspnea   . Essential hypertension   . GERD (gastroesophageal reflux disease)   . Mixed hyperlipidemia   . Normocytic anemia 12/18/2018  . PAF (paroxysmal atrial fibrillation) ([place])    a. s/p TEE cardioversion March 2017. b. Recurrence by event monitor 04/2016.  Marland Kitchen Pneumonia   . PONV (postoperative nausea and vomiting)    after previous bronchoscopy  . RBBB   . Restless leg syndrome   . Type 2 diabetes mellitus ([place])   . Uterine cancer [facility])    Past Surgical History:  Procedure Laterality Date  . ABDOMINAL HYSTERECTOMY     uterine cancer  . BACK SURGERY    . CARDIOVERSION N/A 03/11/2015   Procedure: CARDIOVERSION;  Surgeon: [name], MD;  Location: [place];  Service: Cardiovascular;  Laterality: N/A;  . CHOLECYSTECTOMY    . EYE  SURGERY     cataracts  . FLEXIBLE BRONCHOSCOPY N/A 07/30/2018   Procedure: FLEXIBLE BRONCHOSCOPY;  Surgeon: Sinda Du, MD;  [facility];  Service: Cardiopulmonary;  Laterality: N/A;  . SPINE SURGERY    . TEE WITHOUT CARDIOVERSION N/A 03/11/2015   Procedure: TRANSESOPHAGEAL ECHOCARDIOGRAM (TEE);  Surgeon: [name], MD;  Location: [place];  Service: Cardiovascular;  Laterality: N/A;  . VIDEO BRONCHOSCOPY WITH ENDOBRONCHIAL ULTRASOUND N/A 12/19/2018   Procedure: VIDEO BRONCHOSCOPY WITH ENDOBRONCHIAL ULTRASOUND;  Surgeon: [name], MD;  Location: [place];  Service: Thoracic;  Laterality: N/A;   Social History   Socioeconomic History  . Marital status: Married    Spouse name: earl  . Number of children: 2  . Years of education: 9  . Highest education level: Not on file  Occupational History  . Occupation: retired  Tobacco Use  . Smoking status: Former Smoker    Packs/day: 1.00    Years: 50.00    Pack years: 50.00    Types: Cigarettes    Start date: 01/10/1961    Quit date: 01/10/2005    Years since  quitting: 14.1  . Smokeless tobacco: Former Systems developer    Quit date: 01/10/2005  Substance and Sexual Activity  . Alcohol use: No    Alcohol/week: 0.0 standard drinks  . Drug use: No  . Sexual activity: Not Currently    Partners: Male  Other Topics Concern  . Not on file  Social History Narrative   Moved to this area in 35 from [place].    Married for over 50 years. Has two children. Lives with husband [name] (earl)   Likes to read   Retired from [employer].   Enjoys time with family, beach, read, exericse.   Walk dog.   Eats all food groups.   Wear seatbelt.   Wear sunscreen.   Social Determinants of Health   Financial Resource Strain:   . Difficulty of Paying Living Expenses: Not on file  Food Insecurity:   . Worried About Charity fundraiser in the Last Year: Not on file  . Ran Out of Food in the Last Year: Not on file  Transportation  Needs:   . Lack of Transportation (Medical): Not on file  . Lack of Transportation (Non-Medical): Not on file  Physical Activity:   . Days of Exercise per Week: Not on file  . Minutes of Exercise per Session: Not on file  Stress:   . Feeling of Stress : Not on file  Social Connections:   . Frequency of Communication with Friends and Family: Not on file  . Frequency of Social Gatherings with Friends and Family: Not on file  . Attends Religious Services: Not on file  . Active Member of Clubs or Organizations: Not on file  . Attends Archivist Meetings: Not on file  . Marital Status: Not on file   Outpatient Encounter Medications as of 02/19/2019  Medication Sig  . Accu-Chek FastClix Lancets MISC USE TO TEST BLOOD GLUCOSE FOUR TIMES DAILY  . ACCU-CHEK GUIDE test strip USE AS INSTRUCTED FOUR TIMES DAILY  . ACCU-CHEK SMARTVIEW test strip CHECK BLOOD GLUCOSE FOUR TIMES DAILY  . albuterol (PROVENTIL HFA;VENTOLIN HFA) 108 (90 Base) MCG/ACT inhaler Inhale 2 puffs into the lungs every 6 (six) hours as needed for wheezing or shortness of breath.  . Alcohol Swabs (B-D SINGLE USE SWABS REGULAR) PADS USE FOUR TIMES DAILY  . atenolol (TENORMIN) 50 MG tablet TAKE 1 TABLET EVERY DAY  . atorvastatin (LIPITOR) 40 MG tablet TAKE 1 TABLET EVERY DAY  . b complex vitamins tablet Take 1 tablet by mouth daily.  . Biotin 5000 MCG CAPS Take 1 capsule by mouth daily.  . Blood Glucose Monitoring Suppl (ACCU-CHEK GUIDE) w/Device KIT 1 each by Does not apply route 4 (four) times daily.  . Calcium-Phosphorus-Vitamin D (CALCIUM GUMMIES PO) Take 1 tablet by mouth daily. [phone]  . chlorpheniramine (EQ CHLORTABS) 4 MG tablet Take 4 mg by mouth 2 (two) times daily as needed for allergies.  . cholecalciferol (VITAMIN D) 1000 units tablet Take 1,000 Units by mouth daily.  . Continuous Blood Gluc Receiver (FREESTYLE LIBRE 14 DAY READER) DEVI 1 each by Does not apply route once for 1 dose.  . Continuous Blood Gluc  Sensor (FREESTYLE LIBRE 14 DAY SENSOR) MISC Inject 1 each into the skin every 14 (fourteen) days. Use as directed.  . diltiazem (CARDIZEM CD) 180 MG 24 hr capsule TAKE 1 CAPSULE EVERY DAY (DOSE INCREASED 01/04/18)  . diphenhydrAMINE (BENADRYL) 25 MG tablet Take 25 mg by mouth daily.  Marland Kitchen ELIQUIS 5 MG TABS tablet TAKE  1 TABLET TWICE DAILY  . flecainide (TAMBOCOR) 100 MG tablet TAKE 1 TABLET TWICE DAILY ( DOSE INCREASE )  . fluticasone furoate-vilanterol (BREO ELLIPTA) 100-25 MCG/INH AEPB Inhale 1 puff into the lungs daily.  Marland Kitchen glucose blood (ACCU-CHEK GUIDE) test strip Use as instructed 4 x daily  . insulin aspart (NOVOLOG) 100 UNIT/ML injection Inject 10-16 Units into the skin 3 (three) times daily with meals.  . insulin detemir (LEVEMIR) 100 UNIT/ML injection Inject 0.5 mLs (50 Units total) into the skin at bedtime.  Marland Kitchen ipratropium-albuterol (DUONEB) 0.5-2.5 (3) MG/3ML SOLN Take 3 mLs by nebulization every 6 (six) hours as needed (wheezing/ shortness of breath).  . liraglutide (VICTOZA) 18 MG/3ML SOPN Inject 0.3 mLs (1.8 mg total) into the skin daily.  Marland Kitchen lisinopril (PRINIVIL,ZESTRIL) 20 MG tablet TAKE 1 TABLET (20 MG TOTAL) BY MOUTH DAILY. (Patient taking differently: Take 10 mg by mouth daily. Patient is currently taking a 1/2 tablet due to low BP)  . Misc Natural Products (ESTROVEN + ENERGY MAX STRENGTH) TABS Take 1 tablet by mouth daily.  . Multiple Vitamin (MULTIVITAMIN) tablet Take 1 tablet by mouth daily.  . Omega-3 Fatty Acids (FISH OIL) 1200 MG CAPS Take 1 capsule by mouth daily.  Marland Kitchen omeprazole (PRILOSEC) 20 MG capsule TAKE 1 CAPSULE EVERY DAY  . sertraline (ZOLOFT) 100 MG tablet TAKE 1 TABLET (100 MG TOTAL) BY MOUTH DAILY.  Marland Kitchen torsemide (DEMADEX) 20 MG tablet Take 20 mg by mouth daily.  . vitamin E (VITAMIN E) 400 UNIT capsule Take 400 Units by mouth daily.   Facility-Administered Encounter Medications as of 02/19/2019  Medication  . lactated ringers infusion   ALLERGIES: Allergies   Allergen Reactions  . Penicillins Hives    Has patient had a PCN reaction causing immediate rash, facial/tongue/throat swelling, SOB or lightheadedness with hypotension: Yes Has patient had a PCN reaction causing severe rash involving mucus membranes or skin necrosis: No Has patient had a PCN reaction that required hospitalization No Has patient had a PCN reaction occurring within the last 10 years: No If all of the above answers are "NO", then may proceed with Cephalosporin use.    VACCINATION STATUS: Immunization History  Administered Date(s) Administered  . Influenza, High Dose Seasonal PF 11/14/2018  . Influenza,inj,Quad PF,6+ Mos 11/04/2016  . Pneumococcal Conjugate-13 11/30/2016  . Pneumococcal Polysaccharide-23 06/07/2017  . Td 06/07/2017    Diabetes She presents for her follow-up diabetic visit. She has type 2 diabetes mellitus. Onset time: She was diagnosed at approximate age of 80 years. Her disease course has been stable. There are no hypoglycemic associated symptoms. Pertinent negatives for hypoglycemia include no confusion, headaches, pallor or seizures. There are no diabetic associated symptoms. Pertinent negatives for diabetes include no chest pain, no polydipsia, no polyphagia and no polyuria. There are no hypoglycemic complications. Symptoms are stable. There are no diabetic complications. Risk factors for coronary artery disease include diabetes mellitus, dyslipidemia, hypertension, obesity, sedentary lifestyle and tobacco exposure. Current diabetic treatment includes intensive insulin program. She is compliant with treatment most of the time. Her weight is fluctuating minimally. She is following a generally unhealthy diet. She has not had a previous visit with a dietitian (She declined referral to CDE.). Her home blood glucose trend is fluctuating minimally. Her breakfast blood glucose range is generally 140-180 mg/dl. Her lunch blood glucose range is generally 140-180 mg/dl.  Her dinner blood glucose range is generally 140-180 mg/dl. Her bedtime blood glucose range is generally 140-180 mg/dl. Her overall blood glucose range  is 140-180 mg/dl. An ACE inhibitor/angiotensin II receptor blocker is being taken. She does not see a podiatrist.Eye exam is not current (She is overdue for eye exam, I advised her to schedule a visit to her eye doctor.).  Hyperlipidemia This is a chronic problem. The current episode started more than 1 year ago. The problem is controlled. Exacerbating diseases include diabetes and obesity. Pertinent negatives include no chest pain, leg pain, myalgias or shortness of breath. Current antihyperlipidemic treatment includes statins. Risk factors for coronary artery disease include dyslipidemia, diabetes mellitus, hypertension, a sedentary lifestyle and obesity.  Hypertension This is a chronic problem. The current episode started more than 1 year ago. The problem is controlled. Pertinent negatives include no chest pain, headaches, palpitations or shortness of breath. Risk factors for coronary artery disease include diabetes mellitus, dyslipidemia, obesity, sedentary lifestyle and smoking/tobacco exposure. Past treatments include ACE inhibitors. The current treatment provides moderate improvement.    Review of systems:  Limited as above.    Objective:    There were no vitals taken for this visit.  Wt Readings from Last 3 Encounters:  02/14/19 176 lb 9.6 oz (80.1 kg)  12/19/18 176 lb 5.9 oz (80 kg)  12/13/18 176 lb 6.4 oz (80 kg)      Results for orders placed or performed in visit on 02/14/19  Hypersensitivity Pneumonitis  Result Value Ref Range   A.Fumigatus #1 Abs Negative Negative   Micropolyspora faeni, IgG Negative Negative   Thermoactinomyces vulgaris, IgG Negative Negative   A. Pullulans Abs Negative Negative   Thermoact. Saccharii Negative Negative   [place] Serum Abs Negative Negative  Myositis Assessr Plus Jo-1 Autoabs  Result Value  Ref Range   Jo-1 Autoabs <1.0 NEG <1.0 NEG AI  CK (Creatine Kinase)  Result Value Ref Range   Total CK 54 32 - 182 U/L  Angiotensin converting enzyme  Result Value Ref Range   Angio Convert Enzyme 8 (L) 14 - 82 U/L  ANA+ENA+DNA/DS+Scl 70+SjoSSA/B  Result Value Ref Range   ANA Titer 1 Positive (A)    dsDNA Ab <1 0 - 9 IU/mL   ENA RNP Ab 3.5 (H) 0.0 - 0.9 AI   ENA SM Ab Ser-aCnc <0.2 0.0 - 0.9 AI   Scleroderma (Scl-70) (ENA) Antibody, IgG <0.2 0.0 - 0.9 AI   ENA SSA (RO) Ab <0.2 0.0 - 0.9 AI   ENA SSB (LA) Ab <0.2 0.0 - 0.9 AI  CBC w/Diff  Result Value Ref Range   WBC 6.4 4.0 - 10.5 K/uL   RBC 3.67 (L) 3.87 - 5.11 Mil/uL   Hemoglobin 10.9 (L) 12.0 - 15.0 g/dL   HCT 34.2 (L) 36.0 - 46.0 %   MCV 93.3 78.0 - 100.0 fl   MCHC 31.9 30.0 - 36.0 g/dL   RDW 16.2 (H) 11.5 - 15.5 %   Platelets 169.0 150.0 - 400.0 K/uL   Neutrophils Relative % 78.7 (H) 43.0 - 77.0 %   Lymphocytes Relative 12.7 12.0 - 46.0 %   Monocytes Relative 7.1 3.0 - 12.0 %   Eosinophils Relative 1.2 0.0 - 5.0 %   Basophils Relative 0.3 0.0 - 3.0 %   Neutro Abs 5.0 1.4 - 7.7 K/uL   Lymphs Abs 0.8 0.7 - 4.0 K/uL   Monocytes Absolute 0.5 0.1 - 1.0 K/uL   Eosinophils Absolute 0.1 0.0 - 0.7 K/uL   Basophils Absolute 0.0 0.0 - 0.1 K/uL  INR/PT  Result Value Ref Range   INR 2.3 (H) 0.8 -  1.0 ratio   Prothrombin Time 25.7 (H) 9.6 - 13.1 sec  Basic Metabolic Panel (BMET)  Result Value Ref Range   Sodium 141 135 - 145 mEq/L   Potassium 3.9 3.5 - 5.1 mEq/L   Chloride 102 96 - 112 mEq/L   CO2 29 19 - 32 mEq/L   Glucose, Bld 88 70 - 99 mg/dL   BUN 27 (H) 6 - 23 mg/dL   Creatinine, Ser 1.40 (H) 0.40 - 1.20 mg/dL   GFR 36.63 (L) >60.00 mL/min   Calcium 9.3 8.4 - 10.5 mg/dL  CBC with Differential/Platelet  Result Value Ref Range   WBC CANCELED [id number]  Basic metabolic panel  Result Value Ref Range   Glucose 89 65 - 99 mg/dL   BUN 25 8 - 27 mg/dL   Creatinine, Ser 1.53 (H) 0.57 - 1.00 mg/dL   GFR calc non Af Amer  33 (L) >59 mL/min/1.73   GFR calc Af Amer 38 (L) >59 mL/min/1.73   BUN/Creatinine Ratio 16 12 - 28   Sodium 143 134 - 144 mmol/L   Potassium 4.1 3.5 - 5.2 mmol/L   Chloride 102 96 - 106 mmol/L   CO2 25 20 - 29 mmol/L   Calcium 9.1 8.7 - 10.3 mg/dL  Protime-INR  Result Value Ref Range   INR CANCELED   FANA Staining Patterns  Result Value Ref Range   Speckled Pattern 1:80    Note: Comment    Lipid Panel     Component Value Date/Time   CHOL 118 07/23/2018 0000   TRIG 96 07/23/2018 0000   HDL 35 07/23/2018 0000   CHOLHDL 4.3 06/16/2017 0959   VLDL 49 (H) 03/30/2016 1206   LDLCALC 64 07/23/2018 0000   LDLCALC 63 06/16/2017 0959     Assessment & Plan:   1. Uncontrolled type 2 diabetes mellitus without complication, with long-term current use of insulin   - She remains at a high risk for more acute and chronic complications of diabetes which include CAD, CVA, CKD, retinopathy, and neuropathy. These are all discussed in detail with the patient.  Patient reports near target glycemic profile both fasting and postprandially.  Her previsit labs show A1c of 7.2%, slightly increasing from 6.5%.  -Overall, she has improved her A1c from 14.6%.    Recent labs reviewed.   - I have re-counseled the patient on diet management  by adopting a carbohydrate restricted / protein rich  Diet.  - she  admits there is a room for improvement in her diet and drink choices. -  Suggestion is made for her to avoid simple carbohydrates  from her diet including Cakes, Sweet Desserts / Pastries, Ice Cream, Soda (diet and regular), Sweet Tea, Candies, Chips, Cookies, Sweet Pastries,  Store Bought Juices, Alcohol in Excess of  1-2 drinks a day, Artificial Sweeteners, Coffee Creamer, and "Sugar-free" Products. This will help patient to have stable blood glucose profile and potentially avoid unintended weight gain.   - Patient is advised to stick to a routine mealtimes to eat 3 meals  a day and avoid  unnecessary snacks ( to snack only to correct hypoglycemia).  - The patient  declined a referral to a  CDE for individualized DM education.  - I have approached patient with the following individualized plan to manage diabetes and patient agrees.  -Based on her stable glycemic profile, I advised her to continue on her current dosing regimen of insulin.  -She is advised to continue Levemir 50 units nightly, continue  NovoLog 10 units units   3 times a day before meals  , plus correction dose associated with monitoring of blood glucose 4 times a day-before meals and at bedtime.  - She is advised to continue Victoza at 1.8 mg subcutaneously daily .  - She will greatly benefit from a CGM device.  I discussed and initiated the prescription for freestyle libre device for her again.     - Patient specific target  for A1c; LDL, HDL, Triglycerides,were discussed in detail.  2) BP/HTN:she is advised to home monitor blood pressure and report if > 140/90 on 2 separate readings.     She is advised to continue her current medications including lisinopril 20 mg p.o. daily.    3) Lipids/HPL: Recent lipid panel revealed controlled LDL at 63. She is advised to continue atorvastatin 40 mg p.o. nightly.     4)  Weight/Diet: she has declined CDE consult, exercise, and carbohydrates information provided.  5) Chronic Care/Health Maintenance:  -Patient is on ACEI/ARB and Statin medications and encouraged to continue to follow up with Ophthalmology (she is overdue for her eye exam), Podiatrist at least yearly or according to recommendations, and advised to  stay away from smoking. I have recommended yearly flu vaccine and pneumonia vaccination at least every 5 years; moderate intensity exercise for up to 150 minutes weekly; and  sleep for at least 7 hours a day.    - I advised patient to maintain close follow up with [name], MD for primary care needs.  - Time spent on this patient care encounter:  35 min,  of which >50% was spent in  counseling and the rest reviewing her  current and  previous labs/studies ( including abstraction from other facilities),  previous treatments, her blood glucose readings, and medications' doses and developing a plan for long-term care based on the latest recommendations for standards of care; and documenting her care.  Ellen Jordan participated in the discussions, expressed understanding, and voiced agreement with the above plans.  All questions were answered to her satisfaction. she is encouraged to contact clinic should she have any questions or concerns prior to her return visit.   Follow up plan: -Return in about 3 months (around 05/19/2019) for Bring Meter and Logs- A1c in Office.  Glade Lloyd, MD Phone: [phone]  Fax: [phone]   -  This note was partially dictated with voice recognition software. Similar sounding words can be transcribed inadequately or may not  be corrected upon review.  02/19/2019, 8:57 PM

## 2019-02-26 LAB — MYOSITIS ASSESSR PLUS JO-1 AUTOABS
EJ Autoabs: NOT DETECTED
Jo-1 Autoabs: <1 AI
Ku Autoabs: NOT DETECTED
Mi-2 Autoabs: NOT DETECTED
OJ Autoabs: NOT DETECTED
PL-12 Autoabs: NOT DETECTED
PL-7 Autoabs: NOT DETECTED
SRP Autoabs: NOT DETECTED

## 2019-03-01 ENCOUNTER — Ambulatory Visit (HOSPITAL_COMMUNITY)
Admission: RE | Admit: 2019-03-01 | Discharge: 2019-03-01 | Disposition: A | Discharge status: Home / Self Care | Payer: Medicare HMO | Source: Ambulatory Visit | Attending: Internal Medicine | Admitting: Internal Medicine

## 2019-03-01 ENCOUNTER — Other Ambulatory Visit: Payer: Self-pay

## 2019-03-01 DIAGNOSIS — J849 Interstitial pulmonary disease, unspecified: Secondary | ICD-10-CM | POA: Diagnosis not present

## 2019-03-01 DIAGNOSIS — J9611 Chronic respiratory failure with hypoxia: Secondary | ICD-10-CM | POA: Diagnosis not present

## 2019-03-01 DIAGNOSIS — R590 Localized enlarged lymph nodes: Secondary | ICD-10-CM

## 2019-03-03 DIAGNOSIS — J45901 Unspecified asthma with (acute) exacerbation: Secondary | ICD-10-CM | POA: Diagnosis not present

## 2019-03-03 DIAGNOSIS — I10 Essential (primary) hypertension: Secondary | ICD-10-CM | POA: Diagnosis not present

## 2019-03-03 DIAGNOSIS — I4891 Unspecified atrial fibrillation: Secondary | ICD-10-CM | POA: Diagnosis not present

## 2019-03-03 DIAGNOSIS — R600 Localized edema: Secondary | ICD-10-CM | POA: Diagnosis not present

## 2019-03-03 DIAGNOSIS — K219 Gastro-esophageal reflux disease without esophagitis: Secondary | ICD-10-CM | POA: Diagnosis not present

## 2019-03-03 DIAGNOSIS — I482 Chronic atrial fibrillation, unspecified: Secondary | ICD-10-CM | POA: Diagnosis not present

## 2019-03-03 DIAGNOSIS — E1165 Type 2 diabetes mellitus with hyperglycemia: Secondary | ICD-10-CM | POA: Diagnosis not present

## 2019-03-03 DIAGNOSIS — E1129 Type 2 diabetes mellitus with other diabetic kidney complication: Secondary | ICD-10-CM | POA: Diagnosis not present

## 2019-03-03 DIAGNOSIS — F331 Major depressive disorder, recurrent, moderate: Secondary | ICD-10-CM | POA: Diagnosis not present

## 2019-03-06 DIAGNOSIS — J449 Chronic obstructive pulmonary disease, unspecified: Secondary | ICD-10-CM | POA: Diagnosis not present

## 2019-03-06 DIAGNOSIS — R0602 Shortness of breath: Secondary | ICD-10-CM | POA: Diagnosis not present

## 2019-03-07 ENCOUNTER — Telehealth: Payer: Self-pay | Admitting: Internal Medicine

## 2019-03-07 NOTE — Telephone Encounter (Signed)
I think a follow-up with me got moved out to April because of delays in pulmonary function testing.  However CT scan of the chest [please let her know] shows evidence of some pulmonary congestion  Plan -She needs to come in sooner see nurse practitioner in the next week or so -there might be some adjustment to her diuretic therapy that might be needed in which our nurse practitioner can communicate with her primary cardiologist  Then the subsequent follow-up in April after the pulmonary function test can be with me and so the nurse practitioner

## 2019-03-11 NOTE — Telephone Encounter (Signed)
Attempted to call pt but unable to reach. Unable to leave a VM due to no machine kicking in. Will try to call back later.

## 2019-03-16 ENCOUNTER — Other Ambulatory Visit (HOSPITAL_COMMUNITY): Payer: Medicare HMO

## 2019-03-17 ENCOUNTER — Other Ambulatory Visit: Payer: Self-pay

## 2019-03-17 ENCOUNTER — Inpatient Hospital Stay (HOSPITAL_COMMUNITY)
Admission: EM | Admit: 2019-03-17 | Discharge: 2019-03-28 | DRG: 286 | Disposition: A | Discharge status: Hospice | Payer: Medicare HMO | Attending: Family Medicine | Admitting: Family Medicine

## 2019-03-17 ENCOUNTER — Emergency Department (HOSPITAL_COMMUNITY): Payer: Medicare HMO

## 2019-03-17 ENCOUNTER — Encounter (HOSPITAL_COMMUNITY): Payer: Self-pay | Admitting: *Deleted

## 2019-03-17 DIAGNOSIS — I50811 Acute right heart failure: Secondary | ICD-10-CM | POA: Diagnosis not present

## 2019-03-17 DIAGNOSIS — I10 Essential (primary) hypertension: Secondary | ICD-10-CM | POA: Diagnosis not present

## 2019-03-17 DIAGNOSIS — J309 Allergic rhinitis, unspecified: Secondary | ICD-10-CM | POA: Diagnosis present

## 2019-03-17 DIAGNOSIS — I342 Nonrheumatic mitral (valve) stenosis: Secondary | ICD-10-CM | POA: Diagnosis not present

## 2019-03-17 DIAGNOSIS — Z794 Long term (current) use of insulin: Secondary | ICD-10-CM

## 2019-03-17 DIAGNOSIS — Z20822 Contact with and (suspected) exposure to covid-19: Secondary | ICD-10-CM | POA: Diagnosis present

## 2019-03-17 DIAGNOSIS — Z880 Allergy status to penicillin: Secondary | ICD-10-CM

## 2019-03-17 DIAGNOSIS — E1140 Type 2 diabetes mellitus with diabetic neuropathy, unspecified: Secondary | ICD-10-CM | POA: Diagnosis present

## 2019-03-17 DIAGNOSIS — G2581 Restless legs syndrome: Secondary | ICD-10-CM | POA: Diagnosis present

## 2019-03-17 DIAGNOSIS — E782 Mixed hyperlipidemia: Secondary | ICD-10-CM | POA: Diagnosis present

## 2019-03-17 DIAGNOSIS — I4821 Permanent atrial fibrillation: Secondary | ICD-10-CM | POA: Diagnosis present

## 2019-03-17 DIAGNOSIS — J849 Interstitial pulmonary disease, unspecified: Secondary | ICD-10-CM | POA: Diagnosis present

## 2019-03-17 DIAGNOSIS — Z515 Encounter for palliative care: Secondary | ICD-10-CM | POA: Diagnosis present

## 2019-03-17 DIAGNOSIS — E1122 Type 2 diabetes mellitus with diabetic chronic kidney disease: Secondary | ICD-10-CM | POA: Diagnosis present

## 2019-03-17 DIAGNOSIS — Z95828 Presence of other vascular implants and grafts: Secondary | ICD-10-CM

## 2019-03-17 DIAGNOSIS — J449 Chronic obstructive pulmonary disease, unspecified: Secondary | ICD-10-CM | POA: Diagnosis not present

## 2019-03-17 DIAGNOSIS — I50810 Right heart failure, unspecified: Secondary | ICD-10-CM | POA: Diagnosis not present

## 2019-03-17 DIAGNOSIS — I350 Nonrheumatic aortic (valve) stenosis: Secondary | ICD-10-CM | POA: Diagnosis not present

## 2019-03-17 DIAGNOSIS — J9601 Acute respiratory failure with hypoxia: Secondary | ICD-10-CM | POA: Diagnosis not present

## 2019-03-17 DIAGNOSIS — E872 Acidosis: Secondary | ICD-10-CM | POA: Diagnosis not present

## 2019-03-17 DIAGNOSIS — J441 Chronic obstructive pulmonary disease with (acute) exacerbation: Secondary | ICD-10-CM | POA: Diagnosis not present

## 2019-03-17 DIAGNOSIS — E669 Obesity, unspecified: Secondary | ICD-10-CM | POA: Diagnosis present

## 2019-03-17 DIAGNOSIS — I4510 Unspecified right bundle-branch block: Secondary | ICD-10-CM | POA: Diagnosis present

## 2019-03-17 DIAGNOSIS — I959 Hypotension, unspecified: Secondary | ICD-10-CM | POA: Diagnosis not present

## 2019-03-17 DIAGNOSIS — Z825 Family history of asthma and other chronic lower respiratory diseases: Secondary | ICD-10-CM

## 2019-03-17 DIAGNOSIS — R0602 Shortness of breath: Secondary | ICD-10-CM | POA: Diagnosis present

## 2019-03-17 DIAGNOSIS — R5381 Other malaise: Secondary | ICD-10-CM | POA: Diagnosis not present

## 2019-03-17 DIAGNOSIS — N1832 Chronic kidney disease, stage 3b: Secondary | ICD-10-CM | POA: Diagnosis present

## 2019-03-17 DIAGNOSIS — Z9189 Other specified personal risk factors, not elsewhere classified: Secondary | ICD-10-CM | POA: Diagnosis not present

## 2019-03-17 DIAGNOSIS — F329 Major depressive disorder, single episode, unspecified: Secondary | ICD-10-CM | POA: Diagnosis present

## 2019-03-17 DIAGNOSIS — I5033 Acute on chronic diastolic (congestive) heart failure: Secondary | ICD-10-CM | POA: Diagnosis present

## 2019-03-17 DIAGNOSIS — E119 Type 2 diabetes mellitus without complications: Secondary | ICD-10-CM | POA: Diagnosis not present

## 2019-03-17 DIAGNOSIS — Z6830 Body mass index (BMI) 30.0-30.9, adult: Secondary | ICD-10-CM

## 2019-03-17 DIAGNOSIS — Z66 Do not resuscitate: Secondary | ICD-10-CM | POA: Diagnosis present

## 2019-03-17 DIAGNOSIS — R58 Hemorrhage, not elsewhere classified: Secondary | ICD-10-CM | POA: Diagnosis not present

## 2019-03-17 DIAGNOSIS — N182 Chronic kidney disease, stage 2 (mild): Secondary | ICD-10-CM | POA: Diagnosis not present

## 2019-03-17 DIAGNOSIS — Z452 Encounter for adjustment and management of vascular access device: Secondary | ICD-10-CM | POA: Diagnosis not present

## 2019-03-17 DIAGNOSIS — I509 Heart failure, unspecified: Secondary | ICD-10-CM | POA: Diagnosis not present

## 2019-03-17 DIAGNOSIS — K625 Hemorrhage of anus and rectum: Secondary | ICD-10-CM | POA: Diagnosis present

## 2019-03-17 DIAGNOSIS — F419 Anxiety disorder, unspecified: Secondary | ICD-10-CM | POA: Diagnosis present

## 2019-03-17 DIAGNOSIS — Z7189 Other specified counseling: Secondary | ICD-10-CM

## 2019-03-17 DIAGNOSIS — I081 Rheumatic disorders of both mitral and tricuspid valves: Secondary | ICD-10-CM | POA: Diagnosis present

## 2019-03-17 DIAGNOSIS — G8929 Other chronic pain: Secondary | ICD-10-CM | POA: Diagnosis present

## 2019-03-17 DIAGNOSIS — Z8542 Personal history of malignant neoplasm of other parts of uterus: Secondary | ICD-10-CM

## 2019-03-17 DIAGNOSIS — I482 Chronic atrial fibrillation, unspecified: Secondary | ICD-10-CM | POA: Diagnosis not present

## 2019-03-17 DIAGNOSIS — K219 Gastro-esophageal reflux disease without esophagitis: Secondary | ICD-10-CM | POA: Diagnosis present

## 2019-03-17 DIAGNOSIS — I272 Pulmonary hypertension, unspecified: Secondary | ICD-10-CM | POA: Diagnosis present

## 2019-03-17 DIAGNOSIS — E8770 Fluid overload, unspecified: Secondary | ICD-10-CM | POA: Diagnosis not present

## 2019-03-17 DIAGNOSIS — N179 Acute kidney failure, unspecified: Secondary | ICD-10-CM | POA: Diagnosis present

## 2019-03-17 DIAGNOSIS — E871 Hypo-osmolality and hyponatremia: Secondary | ICD-10-CM | POA: Diagnosis not present

## 2019-03-17 DIAGNOSIS — J9611 Chronic respiratory failure with hypoxia: Secondary | ICD-10-CM | POA: Diagnosis not present

## 2019-03-17 DIAGNOSIS — R0902 Hypoxemia: Secondary | ICD-10-CM | POA: Diagnosis not present

## 2019-03-17 DIAGNOSIS — E1165 Type 2 diabetes mellitus with hyperglycemia: Secondary | ICD-10-CM | POA: Diagnosis not present

## 2019-03-17 DIAGNOSIS — I4892 Unspecified atrial flutter: Secondary | ICD-10-CM | POA: Diagnosis present

## 2019-03-17 DIAGNOSIS — J9621 Acute and chronic respiratory failure with hypoxia: Secondary | ICD-10-CM | POA: Diagnosis not present

## 2019-03-17 DIAGNOSIS — J440 Chronic obstructive pulmonary disease with acute lower respiratory infection: Secondary | ICD-10-CM | POA: Diagnosis present

## 2019-03-17 DIAGNOSIS — J439 Emphysema, unspecified: Secondary | ICD-10-CM | POA: Diagnosis not present

## 2019-03-17 DIAGNOSIS — Z7901 Long term (current) use of anticoagulants: Secondary | ICD-10-CM

## 2019-03-17 DIAGNOSIS — Z8249 Family history of ischemic heart disease and other diseases of the circulatory system: Secondary | ICD-10-CM

## 2019-03-17 DIAGNOSIS — I484 Atypical atrial flutter: Secondary | ICD-10-CM | POA: Diagnosis not present

## 2019-03-17 DIAGNOSIS — Z79899 Other long term (current) drug therapy: Secondary | ICD-10-CM

## 2019-03-17 DIAGNOSIS — D649 Anemia, unspecified: Secondary | ICD-10-CM | POA: Diagnosis not present

## 2019-03-17 DIAGNOSIS — Z833 Family history of diabetes mellitus: Secondary | ICD-10-CM

## 2019-03-17 DIAGNOSIS — Z87891 Personal history of nicotine dependence: Secondary | ICD-10-CM

## 2019-03-17 DIAGNOSIS — I4891 Unspecified atrial fibrillation: Secondary | ICD-10-CM | POA: Diagnosis not present

## 2019-03-17 DIAGNOSIS — N178 Other acute kidney failure: Secondary | ICD-10-CM | POA: Diagnosis not present

## 2019-03-17 DIAGNOSIS — Z8349 Family history of other endocrine, nutritional and metabolic diseases: Secondary | ICD-10-CM

## 2019-03-17 DIAGNOSIS — I130 Hypertensive heart and chronic kidney disease with heart failure and stage 1 through stage 4 chronic kidney disease, or unspecified chronic kidney disease: Principal | ICD-10-CM | POA: Diagnosis present

## 2019-03-17 DIAGNOSIS — R110 Nausea: Secondary | ICD-10-CM | POA: Diagnosis not present

## 2019-03-17 DIAGNOSIS — E1129 Type 2 diabetes mellitus with other diabetic kidney complication: Secondary | ICD-10-CM | POA: Diagnosis not present

## 2019-03-17 DIAGNOSIS — R9431 Abnormal electrocardiogram [ECG] [EKG]: Secondary | ICD-10-CM | POA: Diagnosis not present

## 2019-03-17 DIAGNOSIS — Z7902 Long term (current) use of antithrombotics/antiplatelets: Secondary | ICD-10-CM

## 2019-03-17 DIAGNOSIS — I361 Nonrheumatic tricuspid (valve) insufficiency: Secondary | ICD-10-CM | POA: Diagnosis not present

## 2019-03-17 DIAGNOSIS — I480 Paroxysmal atrial fibrillation: Secondary | ICD-10-CM | POA: Diagnosis not present

## 2019-03-17 LAB — COMPREHENSIVE METABOLIC PANEL
ALT: 20 U/L (ref 0–44)
AST: 28 U/L (ref 15–41)
Albumin: 3.8 g/dL (ref 3.5–5.0)
Alkaline Phosphatase: 63 U/L (ref 38–126)
Anion gap: 13 (ref 5–15)
BUN: 44 mg/dL — ABNORMAL HIGH (ref 8–23)
CO2: 22 mmol/L (ref 22–32)
Calcium: 8.6 mg/dL — ABNORMAL LOW (ref 8.9–10.3)
Chloride: 93 mmol/L — ABNORMAL LOW (ref 98–111)
Creatinine, Ser: 2.99 mg/dL — ABNORMAL HIGH (ref 0.44–1.00)
GFR calc Af Amer: 17 mL/min — ABNORMAL LOW (ref >60)
GFR calc non Af Amer: 15 mL/min — ABNORMAL LOW (ref >60)
Glucose, Bld: 203 mg/dL — ABNORMAL HIGH (ref 70–99)
Potassium: 4.3 mmol/L (ref 3.5–5.1)
Sodium: 128 mmol/L — ABNORMAL LOW (ref 135–145)
Total Bilirubin: 0.6 mg/dL (ref 0.3–1.2)
Total Protein: 7.2 g/dL (ref 6.5–8.1)

## 2019-03-17 LAB — CBC WITH DIFFERENTIAL/PLATELET
Abs Immature Granulocytes: 0.07 10*3/uL (ref 0.00–0.07)
Basophils Absolute: 0 10*3/uL (ref 0.0–0.1)
Basophils Relative: 0 %
Eosinophils Absolute: 0 10*3/uL (ref 0.0–0.5)
Eosinophils Relative: 0 %
HCT: 30.3 % — ABNORMAL LOW (ref 36.0–46.0)
Hemoglobin: 9.4 g/dL — ABNORMAL LOW (ref 12.0–15.0)
Immature Granulocytes: 1 %
Lymphocytes Relative: 5 %
Lymphs Abs: 0.6 10*3/uL — ABNORMAL LOW (ref 0.7–4.0)
MCH: 30.2 pg (ref 26.0–34.0)
MCHC: 31 g/dL (ref 30.0–36.0)
MCV: 97.4 fL (ref 80.0–100.0)
Monocytes Absolute: 0.6 10*3/uL (ref 0.1–1.0)
Monocytes Relative: 5 %
Neutro Abs: 9.5 10*3/uL — ABNORMAL HIGH (ref 1.7–7.7)
Neutrophils Relative %: 89 %
Platelets: 176 10*3/uL (ref 150–400)
RBC: 3.11 MIL/uL — ABNORMAL LOW (ref 3.87–5.11)
RDW: 15.1 % (ref 11.5–15.5)
WBC: 10.8 10*3/uL — ABNORMAL HIGH (ref 4.0–10.5)
nRBC: 0 % (ref 0.0–0.2)

## 2019-03-17 LAB — BRAIN NATRIURETIC PEPTIDE: B Natriuretic Peptide: 338 pg/mL — ABNORMAL HIGH (ref 0.0–100.0)

## 2019-03-17 LAB — TYPE AND SCREEN
ABO/RH(D): O POS
Antibody Screen: NEGATIVE

## 2019-03-17 LAB — TROPONIN I (HIGH SENSITIVITY): Troponin I (High Sensitivity): 8 ng/L (ref <18)

## 2019-03-17 MED ORDER — SODIUM CHLORIDE 0.9 % IV SOLN: INTRAVENOUS | Status: DC

## 2019-03-17 MED ORDER — SODIUM CHLORIDE 0.9 % IV BOLUS
500.0000 mL | Freq: Once | INTRAVENOUS | Status: AC
  Administered 2019-03-17: 500 mL via INTRAVENOUS

## 2019-03-17 NOTE — ED Triage Notes (Signed)
Pt arrived to er by ems with c/o sob that has been ongoing but has gotten worse the past few days, pt also c/o nausea, was given 4 mg zofran by iv enoute with ems with no improvement of nausea.  Pt also c/o rectal bleeding that started tonight after having a hard bowel movement. Pt denies any pain.

## 2019-03-17 NOTE — ED Provider Notes (Signed)
[facility] EMERGENCY DEPARTMENT Provider Note   CSN: [id number] Arrival date & time: 03/17/19  2101     History Chief Complaint  Patient presents with  . Shortness of Breath    Ellen Jordan is a 74 y.o. female.  Patient brought in by EMS.  Complaint of shortness of breath is ongoing ongoing.  Got worse the past few days.  Patient with complaint of some nausea.  Was given Zofran in route.  They stated no improvement in nausea.  Patient without complaint here.  Patient states she had some rectal bleeding tonight that occurs occasionally when she has a hard bowel movement.  Denies any chest pain or abdominal pain.  No syncope.  Patient's past medical history is significant for atrial fibrillation, chronic and states she is on Eliquis for that.  Patient is also followed carefully by pulmonary medicine for a combination of COPD and medication induced interstitial lung disease.  Past medical history significant for the paroxysmal atrial fibrillation right bundle branch block.  Type 2 diabetes.  Diabetic neuropathy.  Medication wise from what they have listed from the visit in October is significant for the patient began on diltiazem a.m. Cardizem Eliquis flecainide and Demadex 20 mg daily.        Past Medical History:  Diagnosis Date  . Allergic rhinitis   . Allergy   . Anxiety   . Atrial fibrillation ([place])   . Carpal tunnel syndrome    left  . Cataract   . Depression   . Diabetic neuropathy ([place])   . Dyspnea   . Essential hypertension   . GERD (gastroesophageal reflux disease)   . Mixed hyperlipidemia   . Normocytic anemia 12/18/2018  . PAF (paroxysmal atrial fibrillation) ([place])    a. s/p TEE cardioversion March 2017. b. Recurrence by event monitor 04/2016.  Marland Kitchen Pneumonia   . PONV (postoperative nausea and vomiting)    after previous bronchoscopy  . RBBB   . Restless leg syndrome   . Type 2 diabetes mellitus ([place])   . Uterine cancer [facility])     Patient Active Problem List    Diagnosis Date Noted  . Uncontrolled type 2 diabetes mellitus with hyperglycemia ([place]) 02/19/2019  . Atrial fibrillation with RVR ([place]) 12/18/2018  . Hypokalemia 12/18/2018  . Normocytic anemia 12/18/2018  . Mediastinal lymphadenopathy 12/18/2018  . Pulmonary nodules 12/18/2018  . Pulmonary hypertension ([place]) 09/08/2018  . History of tobacco abuse 11/09/2016  . Controlled diabetes mellitus type 2 with complications ([place]) 74/94/4967  . Restless leg syndrome 11/04/2016  . PAF (paroxysmal atrial fibrillation) ([place]) 06/03/2016  . RBBB 06/03/2016  . Atrial fibrillation with rapid ventricular response ([place]) 02/28/2015  . Uncontrolled type 2 diabetes mellitus without complication, with long-term current use of insulin 12/24/2014  . Mixed hyperlipidemia 12/24/2014  . Essential hypertension, benign 12/24/2014    Past Surgical History:  Procedure Laterality Date  . ABDOMINAL HYSTERECTOMY     uterine cancer  . BACK SURGERY    . CARDIOVERSION N/A 03/11/2015   Procedure: CARDIOVERSION;  Surgeon: [name], MD;  Location: [place];  Service: Cardiovascular;  Laterality: N/A;  . CHOLECYSTECTOMY    . EYE SURGERY     cataracts  . FLEXIBLE BRONCHOSCOPY N/A 07/30/2018   Procedure: FLEXIBLE BRONCHOSCOPY;  Surgeon: Sinda Du, MD;  [facility];  Service: Cardiopulmonary;  Laterality: N/A;  . SPINE SURGERY    . TEE WITHOUT CARDIOVERSION N/A 03/11/2015   Procedure: TRANSESOPHAGEAL ECHOCARDIOGRAM (TEE);  Surgeon: Jamse [place]  Meda Coffee, MD;  Location: [place];  Service: Cardiovascular;  Laterality: N/A;  . VIDEO BRONCHOSCOPY WITH ENDOBRONCHIAL ULTRASOUND N/A 12/19/2018   Procedure: VIDEO BRONCHOSCOPY WITH ENDOBRONCHIAL ULTRASOUND;  Surgeon: [name], MD;  Location: [facility];  Service: Thoracic;  Laterality: N/A;     OB History    Gravida  2   Para  2   Term  2   Preterm      AB      Living  2     SAB      TAB      Ectopic      Multiple      Live Births                Family History  Problem Relation Age of Onset  . Heart disease Mother 65  . Hyperlipidemia Mother   . Diabetes Brother   . COPD Father 73       emphysema  . Hypertension Father   . Hyperlipidemia Father   . Diabetes Daughter   . AAA (abdominal aortic aneurysm) Son   . Diabetes Brother   . Diabetes Brother   . Diabetes Brother     Social History   Tobacco Use  . Smoking status: Former Smoker    Packs/day: 1.00    Years: 50.00    Pack years: 50.00    Types: Cigarettes    Start date: 01/10/1961    Quit date: 01/10/2005    Years since quitting: 14.1  . Smokeless tobacco: Former Systems developer    Quit date: 01/10/2005  Substance Use Topics  . Alcohol use: No    Alcohol/week: 0.0 standard drinks  . Drug use: No    Home Medications Prior to Admission medications   Medication Sig Start Date End Date Taking? Authorizing Provider  Accu-Chek FastClix Lancets MISC USE TO TEST BLOOD GLUCOSE FOUR TIMES DAILY 02/14/19   Cassandria Anger, MD  ACCU-CHEK GUIDE test strip USE AS INSTRUCTED FOUR TIMES DAILY 11/06/18   Nida, Marella Chimes, MD  ACCU-CHEK SMARTVIEW test strip CHECK BLOOD GLUCOSE FOUR TIMES DAILY 03/07/16   Cassandria Anger, MD  albuterol (PROVENTIL HFA;VENTOLIN HFA) 108 (90 Base) MCG/ACT inhaler Inhale 2 puffs into the lungs every 6 (six) hours as needed for wheezing or shortness of breath. 06/07/17   Caren Macadam, MD  Alcohol Swabs (B-D SINGLE USE SWABS REGULAR) PADS USE FOUR TIMES DAILY 12/04/18   Cassandria Anger, MD  atenolol (TENORMIN) 50 MG tablet TAKE 1 TABLET EVERY DAY 09/04/17   Caren Macadam, MD  atorvastatin (LIPITOR) 40 MG tablet TAKE 1 TABLET EVERY DAY 07/16/18   Satira Sark, MD  b complex vitamins tablet Take 1 tablet by mouth daily.    [provider]  Biotin 5000 MCG CAPS Take 1 capsule by mouth daily.    [provider]  Blood Glucose Monitoring Suppl (ACCU-CHEK GUIDE) w/Device KIT 1 each by Does not apply route 4  (four) times daily. 02/21/17   Cassandria Anger, MD  Calcium-Phosphorus-Vitamin D (CALCIUM GUMMIES PO) Take 1 tablet by mouth daily. [phone]    [provider]  chlorpheniramine (EQ CHLORTABS) 4 MG tablet Take 4 mg by mouth 2 (two) times daily as needed for allergies.    [provider]  cholecalciferol (VITAMIN D) 1000 units tablet Take 1,000 Units by mouth daily.    [provider]  Continuous Blood Gluc Sensor (FREESTYLE LIBRE 14 DAY SENSOR) MISC Inject 1 each into the skin every  14 (fourteen) days. Use as directed. 02/19/19   Cassandria Anger, MD  diltiazem (CARDIZEM CD) 180 MG 24 hr capsule TAKE 1 CAPSULE EVERY DAY (DOSE INCREASED 01/04/18) 01/14/19   Satira Sark, MD  diphenhydrAMINE (BENADRYL) 25 MG tablet Take 25 mg by mouth daily.    [provider]  ELIQUIS 5 MG TABS tablet TAKE 1 TABLET TWICE DAILY 09/05/18   Satira Sark, MD  flecainide (TAMBOCOR) 100 MG tablet TAKE 1 TABLET TWICE DAILY ( DOSE INCREASE ) 11/05/18   Satira Sark, MD  fluticasone furoate-vilanterol (BREO ELLIPTA) 100-25 MCG/INH AEPB Inhale 1 puff into the lungs daily. 09/12/18   [name], MD  glucose blood (ACCU-CHEK GUIDE) test strip Use as instructed 4 x daily 02/21/17   Cassandria Anger, MD  insulin aspart (NOVOLOG) 100 UNIT/ML injection Inject 10-16 Units into the skin 3 (three) times daily with meals. 05/17/18   Cassandria Anger, MD  insulin detemir (LEVEMIR) 100 UNIT/ML injection Inject 0.5 mLs (50 Units total) into the skin at bedtime. 07/21/16   Cassandria Anger, MD  ipratropium-albuterol (DUONEB) 0.5-2.5 (3) MG/3ML SOLN Take 3 mLs by nebulization every 6 (six) hours as needed (wheezing/ shortness of breath).    [provider]  liraglutide (VICTOZA) 18 MG/3ML SOPN Inject 0.3 mLs (1.8 mg total) into the skin daily. 08/15/18   Cassandria Anger, MD  lisinopril (PRINIVIL,ZESTRIL) 20 MG tablet TAKE 1 TABLET (20 MG TOTAL) BY MOUTH  DAILY. Patient taking differently: Take 10 mg by mouth daily. Patient is currently taking a 1/2 tablet due to low BP 09/04/17   Caren Macadam, MD  Misc Natural Products (ESTROVEN + ENERGY MAX STRENGTH) TABS Take 1 tablet by mouth daily.    [provider]  Multiple Vitamin (MULTIVITAMIN) tablet Take 1 tablet by mouth daily.    [provider]  Omega-3 Fatty Acids (FISH OIL) 1200 MG CAPS Take 1 capsule by mouth daily.    [provider]  omeprazole (PRILOSEC) 20 MG capsule TAKE 1 CAPSULE EVERY DAY 02/13/19   Satira Sark, MD  sertraline (ZOLOFT) 100 MG tablet TAKE 1 TABLET (100 MG TOTAL) BY MOUTH DAILY. 09/04/17   Caren Macadam, MD  torsemide (DEMADEX) 20 MG tablet Take 20 mg by mouth daily. 04/03/18   [provider]  vitamin E (VITAMIN E) 400 UNIT capsule Take 400 Units by mouth daily.    [provider]    Allergies    Penicillins  Review of Systems   Review of Systems  Constitutional: Negative for chills and fever.  HENT: Negative for congestion, rhinorrhea and sore throat.   Eyes: Negative for visual disturbance.  Respiratory: Positive for shortness of breath. Negative for cough.   Cardiovascular: Negative for chest pain and leg swelling.  Gastrointestinal: Positive for blood in stool. Negative for abdominal pain, diarrhea, nausea and vomiting.  Genitourinary: Negative for dysuria.  Musculoskeletal: Negative for back pain and neck pain.  Skin: Negative for rash.  Neurological: Negative for dizziness, light-headedness and headaches.  Hematological: Does not bruise/bleed easily.  Psychiatric/Behavioral: Negative for confusion.    Physical Exam Updated Vital Signs BP 126/79   Pulse 73   Temp (!) 97.5 F (36.4 C)   Resp 15   Ht 1.6 m (_0 )   Wt 79.4 kg   SpO2 99%   BMI 31.00 kg/m   Physical Exam Vitals and nursing note reviewed.  Constitutional:      General: She is not in acute  distress.    Appearance: Normal  appearance. She is well-developed.  HENT:     Head: Normocephalic and atraumatic.  Eyes:     Extraocular Movements: Extraocular movements intact.     Conjunctiva/sclera: Conjunctivae normal.     Pupils: Pupils are equal, round, and reactive to light.  Cardiovascular:     Rate and Rhythm: Normal rate and regular rhythm.     Heart sounds: No murmur.  Pulmonary:     Effort: Pulmonary effort is normal. No respiratory distress.     Breath sounds: Normal breath sounds.  Chest:     Chest wall: No tenderness.  Abdominal:     Palpations: Abdomen is soft.     Tenderness: There is no abdominal tenderness.  Musculoskeletal:        General: No swelling.     Cervical back: Normal range of motion and neck supple.  Skin:    General: Skin is warm and dry.  Neurological:     General: No focal deficit present.     Mental Status: She is alert and oriented to person, place, and time.     ED Results / Procedures / Treatments   Labs (all labs ordered are listed, but only abnormal results are displayed) Labs Reviewed  CBC WITH DIFFERENTIAL/PLATELET - Abnormal; Notable for the following components:      Result Value   WBC 10.8 (*)    RBC 3.11 (*)    Hemoglobin 9.4 (*)    HCT 30.3 (*)    Neutro Abs 9.5 (*)    Lymphs Abs 0.6 (*)    All other components within normal limits  COMPREHENSIVE METABOLIC PANEL - Abnormal; Notable for the following components:   Sodium 128 (*)    Chloride 93 (*)    Glucose, Bld 203 (*)    BUN 44 (*)    Creatinine, Ser 2.99 (*)    Calcium 8.6 (*)    GFR calc non Af Amer 15 (*)    GFR calc Af Amer 17 (*)    All other components within normal limits  BRAIN NATRIURETIC PEPTIDE - Abnormal; Notable for the following components:   B Natriuretic Peptide 338.0 (*)    All other components within normal limits  TYPE AND SCREEN  TROPONIN I (HIGH SENSITIVITY)   Results for orders placed or performed during the hospital encounter of 03/17/19  CBC with Differential/Platelet   Result Value Ref Range   WBC 10.8 (H) 4.0 - 10.5 K/uL   RBC 3.11 (L) 3.87 - 5.11 MIL/uL   Hemoglobin 9.4 (L) 12.0 - 15.0 g/dL   HCT 30.3 (L) 36.0 - 46.0 %   MCV 97.4 80.0 - 100.0 fL   MCH 30.2 26.0 - 34.0 pg   MCHC 31.0 30.0 - 36.0 g/dL   RDW 15.1 11.5 - 15.5 %   Platelets 176 150 - 400 K/uL   nRBC 0.0 0.0 - 0.2 %   Neutrophils Relative % 89 %   Neutro Abs 9.5 (H) 1.7 - 7.7 K/uL   Lymphocytes Relative 5 %   Lymphs Abs 0.6 (L) 0.7 - 4.0 K/uL   Monocytes Relative 5 %   Monocytes Absolute 0.6 0.1 - 1.0 K/uL   Eosinophils Relative 0 %   Eosinophils Absolute 0.0 0.0 - 0.5 K/uL   Basophils Relative 0 %   Basophils Absolute 0.0 0.0 - 0.1 K/uL   Immature Granulocytes 1 %   Abs Immature Granulocytes 0.07 0.00 - 0.07 K/uL  Comprehensive metabolic panel  Result  Value Ref Range   Sodium 128 (L) 135 - 145 mmol/L   Potassium 4.3 3.5 - 5.1 mmol/L   Chloride 93 (L) 98 - 111 mmol/L   CO2 22 22 - 32 mmol/L   Glucose, Bld 203 (H) 70 - 99 mg/dL   BUN 44 (H) 8 - 23 mg/dL   Creatinine, Ser 2.99 (H) 0.44 - 1.00 mg/dL   Calcium 8.6 (L) 8.9 - 10.3 mg/dL   Total Protein 7.2 6.5 - 8.1 g/dL   Albumin 3.8 3.5 - 5.0 g/dL   AST 28 15 - 41 U/L   ALT 20 0 - 44 U/L   Alkaline Phosphatase 63 38 - 126 U/L   Total Bilirubin 0.6 0.3 - 1.2 mg/dL   GFR calc non Af Amer 15 (L) >60 mL/min   GFR calc Af Amer 17 (L) >60 mL/min   Anion gap 13 5 - 15  Brain natriuretic peptide  Result Value Ref Range   B Natriuretic Peptide 338.0 (H) 0.0 - 100.0 pg/mL  Type and screen [facility]  Result Value Ref Range   ABO/RH(D) O POS    Antibody Screen NEG    Sample Expiration      03/20/2019,2359 Performed at [facility], [address] [place], [place] [address]   Troponin I (High Sensitivity)  Result Value Ref Range   Troponin I (High Sensitivity) 8 <18 ng/L     EKG EKG Interpretation  Date/Time:  Sunday March 17 2019 21:11:03 EST Ventricular Rate:  79 PR Interval:    QRS Duration: 241 QT  Interval:  523 QTC Calculation: 600 R Axis:   135 Text Interpretation: Atrial fibrillation Right bundle branch block Confirmed by Fredia Sorrow [phone]) on 03/17/2019 9:37:36 PM   Radiology DG Chest Port 1 View  Result Date: 03/17/2019 CLINICAL DATA:  74 year old female with shortness of breath. EXAM: PORTABLE CHEST 1 VIEW COMPARISON:  Chest CT dated 03/01/2019. FINDINGS: There is background of chronic interstitial coarsening. Mild hazy airspace density primarily involving the mid to lower lung field likely combination of vascular congestion and areas of air trapping or small airway disease. Pneumonia is less likely but not excluded. Clinical correlation is recommended. No lobar consolidation, pleural effusion, or pneumothorax. There is mild cardiomegaly with mild vascular congestion. Atherosclerotic calcification of the aorta. No acute osseous pathology. IMPRESSION: 1. Mild cardiomegaly with mild vascular congestion. 2. Probable combination of air trapping and small airway disease. Pneumonia is less likely but not excluded. Electronically Signed   By: Anner Crete M.D.   On: 03/17/2019 22:09    Procedures Procedures (including critical care time)  Medications Ordered in ED Medications - No data to display  ED Course  I have reviewed the triage vital signs and the nursing notes.  Pertinent labs & imaging results that were available during my care of the patient were reviewed by me and considered in my medical decision making (see chart for details).    MDM Rules/Calculators/A&P                       From reading the pulmonology notes.  Sounds as if her exertional shortness of breath is somewhat baseline.  Patient is chronically on oxygen 2 L.  And oxygen saturations are good on that.  Chest x-ray without acute findings.  Patient's hemoglobin down a little lower from baseline.  Opponent is not elevated.  Kidney function is significantly worse consistent with acute kidney injury.  May be  prerenal.  We will give some gentle fluids.  Talk to hospitalist about admission.  Will do Covid testing.   Final Clinical Impression(s) / ED Diagnoses Final diagnoses:  SOB (shortness of breath)  Chronic atrial fibrillation (HCC)  AKI (acute kidney injury) [facility])    Rx / DC Orders ED Discharge Orders    None       Fredia Sorrow, MD 03/17/19 2335

## 2019-03-18 ENCOUNTER — Encounter (HOSPITAL_COMMUNITY): Payer: Self-pay | Admitting: Family Medicine

## 2019-03-18 ENCOUNTER — Other Ambulatory Visit: Payer: Self-pay

## 2019-03-18 ENCOUNTER — Ambulatory Visit: Payer: Medicare HMO | Admitting: Internal Medicine

## 2019-03-18 DIAGNOSIS — N178 Other acute kidney failure: Secondary | ICD-10-CM | POA: Diagnosis not present

## 2019-03-18 DIAGNOSIS — R0602 Shortness of breath: Secondary | ICD-10-CM | POA: Diagnosis present

## 2019-03-18 DIAGNOSIS — I4821 Permanent atrial fibrillation: Secondary | ICD-10-CM | POA: Diagnosis present

## 2019-03-18 DIAGNOSIS — E1140 Type 2 diabetes mellitus with diabetic neuropathy, unspecified: Secondary | ICD-10-CM | POA: Diagnosis present

## 2019-03-18 DIAGNOSIS — G8929 Other chronic pain: Secondary | ICD-10-CM | POA: Diagnosis present

## 2019-03-18 DIAGNOSIS — N179 Acute kidney failure, unspecified: Secondary | ICD-10-CM

## 2019-03-18 DIAGNOSIS — I4892 Unspecified atrial flutter: Secondary | ICD-10-CM | POA: Diagnosis present

## 2019-03-18 DIAGNOSIS — N1832 Chronic kidney disease, stage 3b: Secondary | ICD-10-CM

## 2019-03-18 DIAGNOSIS — I4510 Unspecified right bundle-branch block: Secondary | ICD-10-CM | POA: Diagnosis present

## 2019-03-18 DIAGNOSIS — I361 Nonrheumatic tricuspid (valve) insufficiency: Secondary | ICD-10-CM | POA: Diagnosis not present

## 2019-03-18 DIAGNOSIS — Z20822 Contact with and (suspected) exposure to covid-19: Secondary | ICD-10-CM | POA: Diagnosis present

## 2019-03-18 DIAGNOSIS — J449 Chronic obstructive pulmonary disease, unspecified: Secondary | ICD-10-CM | POA: Diagnosis not present

## 2019-03-18 DIAGNOSIS — J849 Interstitial pulmonary disease, unspecified: Secondary | ICD-10-CM | POA: Diagnosis present

## 2019-03-18 DIAGNOSIS — Z9189 Other specified personal risk factors, not elsewhere classified: Secondary | ICD-10-CM | POA: Diagnosis not present

## 2019-03-18 DIAGNOSIS — K625 Hemorrhage of anus and rectum: Secondary | ICD-10-CM | POA: Diagnosis present

## 2019-03-18 DIAGNOSIS — I50810 Right heart failure, unspecified: Secondary | ICD-10-CM | POA: Diagnosis not present

## 2019-03-18 DIAGNOSIS — G2581 Restless legs syndrome: Secondary | ICD-10-CM | POA: Diagnosis present

## 2019-03-18 DIAGNOSIS — I509 Heart failure, unspecified: Secondary | ICD-10-CM | POA: Diagnosis not present

## 2019-03-18 DIAGNOSIS — I484 Atypical atrial flutter: Secondary | ICD-10-CM | POA: Diagnosis not present

## 2019-03-18 DIAGNOSIS — J9621 Acute and chronic respiratory failure with hypoxia: Secondary | ICD-10-CM | POA: Diagnosis not present

## 2019-03-18 DIAGNOSIS — D649 Anemia, unspecified: Secondary | ICD-10-CM | POA: Diagnosis not present

## 2019-03-18 DIAGNOSIS — E782 Mixed hyperlipidemia: Secondary | ICD-10-CM | POA: Diagnosis present

## 2019-03-18 DIAGNOSIS — I130 Hypertensive heart and chronic kidney disease with heart failure and stage 1 through stage 4 chronic kidney disease, or unspecified chronic kidney disease: Secondary | ICD-10-CM | POA: Diagnosis present

## 2019-03-18 DIAGNOSIS — Z7189 Other specified counseling: Secondary | ICD-10-CM | POA: Diagnosis not present

## 2019-03-18 DIAGNOSIS — J309 Allergic rhinitis, unspecified: Secondary | ICD-10-CM | POA: Diagnosis present

## 2019-03-18 DIAGNOSIS — N182 Chronic kidney disease, stage 2 (mild): Secondary | ICD-10-CM | POA: Diagnosis not present

## 2019-03-18 DIAGNOSIS — J440 Chronic obstructive pulmonary disease with acute lower respiratory infection: Secondary | ICD-10-CM | POA: Diagnosis present

## 2019-03-18 DIAGNOSIS — I342 Nonrheumatic mitral (valve) stenosis: Secondary | ICD-10-CM | POA: Diagnosis not present

## 2019-03-18 DIAGNOSIS — Z66 Do not resuscitate: Secondary | ICD-10-CM | POA: Diagnosis present

## 2019-03-18 DIAGNOSIS — E871 Hypo-osmolality and hyponatremia: Secondary | ICD-10-CM | POA: Diagnosis not present

## 2019-03-18 DIAGNOSIS — Z515 Encounter for palliative care: Secondary | ICD-10-CM | POA: Diagnosis present

## 2019-03-18 DIAGNOSIS — I350 Nonrheumatic aortic (valve) stenosis: Secondary | ICD-10-CM | POA: Diagnosis not present

## 2019-03-18 DIAGNOSIS — F329 Major depressive disorder, single episode, unspecified: Secondary | ICD-10-CM | POA: Diagnosis present

## 2019-03-18 DIAGNOSIS — I272 Pulmonary hypertension, unspecified: Secondary | ICD-10-CM | POA: Diagnosis present

## 2019-03-18 DIAGNOSIS — E1122 Type 2 diabetes mellitus with diabetic chronic kidney disease: Secondary | ICD-10-CM | POA: Diagnosis present

## 2019-03-18 DIAGNOSIS — R5381 Other malaise: Secondary | ICD-10-CM | POA: Diagnosis not present

## 2019-03-18 DIAGNOSIS — I5033 Acute on chronic diastolic (congestive) heart failure: Secondary | ICD-10-CM | POA: Diagnosis present

## 2019-03-18 DIAGNOSIS — K219 Gastro-esophageal reflux disease without esophagitis: Secondary | ICD-10-CM | POA: Diagnosis present

## 2019-03-18 DIAGNOSIS — I482 Chronic atrial fibrillation, unspecified: Secondary | ICD-10-CM | POA: Diagnosis not present

## 2019-03-18 DIAGNOSIS — I50811 Acute right heart failure: Secondary | ICD-10-CM | POA: Diagnosis not present

## 2019-03-18 DIAGNOSIS — J9601 Acute respiratory failure with hypoxia: Secondary | ICD-10-CM | POA: Diagnosis not present

## 2019-03-18 DIAGNOSIS — I4891 Unspecified atrial fibrillation: Secondary | ICD-10-CM | POA: Diagnosis not present

## 2019-03-18 DIAGNOSIS — J9611 Chronic respiratory failure with hypoxia: Secondary | ICD-10-CM | POA: Diagnosis not present

## 2019-03-18 DIAGNOSIS — F419 Anxiety disorder, unspecified: Secondary | ICD-10-CM | POA: Diagnosis present

## 2019-03-18 HISTORY — DX: Acute kidney failure, unspecified: N17.9

## 2019-03-18 LAB — CBC
HCT: 31.5 % — ABNORMAL LOW (ref 36.0–46.0)
Hemoglobin: 9.5 g/dL — ABNORMAL LOW (ref 12.0–15.0)
MCH: 29.9 pg (ref 26.0–34.0)
MCHC: 30.2 g/dL (ref 30.0–36.0)
MCV: 99.1 fL (ref 80.0–100.0)
Platelets: 192 10*3/uL (ref 150–400)
RBC: 3.18 MIL/uL — ABNORMAL LOW (ref 3.87–5.11)
RDW: 15.1 % (ref 11.5–15.5)
WBC: 9.7 10*3/uL (ref 4.0–10.5)
nRBC: 0 % (ref 0.0–0.2)

## 2019-03-18 LAB — GLUCOSE, CAPILLARY
Glucose-Capillary: 185 mg/dL — ABNORMAL HIGH (ref 70–99)
Glucose-Capillary: 220 mg/dL — ABNORMAL HIGH (ref 70–99)
Glucose-Capillary: 229 mg/dL — ABNORMAL HIGH (ref 70–99)
Glucose-Capillary: 315 mg/dL — ABNORMAL HIGH (ref 70–99)

## 2019-03-18 LAB — COMPREHENSIVE METABOLIC PANEL
ALT: 18 U/L (ref 0–44)
AST: 30 U/L (ref 15–41)
Albumin: 3.7 g/dL (ref 3.5–5.0)
Alkaline Phosphatase: 62 U/L (ref 38–126)
Anion gap: 16 — ABNORMAL HIGH (ref 5–15)
BUN: 46 mg/dL — ABNORMAL HIGH (ref 8–23)
CO2: 19 mmol/L — ABNORMAL LOW (ref 22–32)
Calcium: 8.6 mg/dL — ABNORMAL LOW (ref 8.9–10.3)
Chloride: 96 mmol/L — ABNORMAL LOW (ref 98–111)
Creatinine, Ser: 2.83 mg/dL — ABNORMAL HIGH (ref 0.44–1.00)
GFR calc Af Amer: 18 mL/min — ABNORMAL LOW (ref >60)
GFR calc non Af Amer: 16 mL/min — ABNORMAL LOW (ref >60)
Glucose, Bld: 197 mg/dL — ABNORMAL HIGH (ref 70–99)
Potassium: 5 mmol/L (ref 3.5–5.1)
Sodium: 131 mmol/L — ABNORMAL LOW (ref 135–145)
Total Bilirubin: 0.6 mg/dL (ref 0.3–1.2)
Total Protein: 7.1 g/dL (ref 6.5–8.1)

## 2019-03-18 LAB — HEMOGLOBIN A1C
Hgb A1c MFr Bld: 6.7 % — ABNORMAL HIGH (ref 4.8–5.6)
Mean Plasma Glucose: 145.59 mg/dL

## 2019-03-18 LAB — TROPONIN I (HIGH SENSITIVITY)
Troponin I (High Sensitivity): 8 ng/L (ref <18)
Troponin I (High Sensitivity): 8 ng/L (ref <18)
Troponin I (High Sensitivity): 9 ng/L (ref <18)

## 2019-03-18 LAB — SARS CORONAVIRUS 2 (TAT 6-24 HRS): SARS Coronavirus 2: NEGATIVE

## 2019-03-18 MED ORDER — SODIUM CHLORIDE 0.9 % IV SOLN: 250.0000 mL | INTRAVENOUS | Status: DC | PRN

## 2019-03-18 MED ORDER — DILTIAZEM HCL ER COATED BEADS 180 MG PO CP24
180.0000 mg | ORAL_CAPSULE | Freq: Every day | ORAL | Status: DC
  Administered 2019-03-18: 180 mg via ORAL
  Filled 2019-03-18 (×3): qty 1

## 2019-03-18 MED ORDER — INSULIN DETEMIR 100 UNIT/ML ~~LOC~~ SOLN
50.0000 [IU] | Freq: Every day | SUBCUTANEOUS | Status: DC
  Administered 2019-03-18 – 2019-03-21 (×3): 50 [IU] via SUBCUTANEOUS
  Filled 2019-03-18 (×6): qty 0.5

## 2019-03-18 MED ORDER — ACETAMINOPHEN 325 MG PO TABS
650.0000 mg | ORAL_TABLET | Freq: Four times a day (QID) | ORAL | Status: DC | PRN
  Administered 2019-03-18 – 2019-03-20 (×3): 650 mg via ORAL
  Filled 2019-03-18 (×3): qty 2

## 2019-03-18 MED ORDER — IPRATROPIUM-ALBUTEROL 0.5-2.5 (3) MG/3ML IN SOLN: 3.0000 mL | Freq: Four times a day (QID) | RESPIRATORY_TRACT | Status: DC | PRN

## 2019-03-18 MED ORDER — ORAL CARE MOUTH RINSE
15.0000 mL | Freq: Two times a day (BID) | OROMUCOSAL | Status: DC
  Administered 2019-03-18 – 2019-03-28 (×18): 15 mL via OROMUCOSAL

## 2019-03-18 MED ORDER — FLUTICASONE FUROATE-VILANTEROL 100-25 MCG/INH IN AEPB
1.0000 | INHALATION_SPRAY | Freq: Every day | RESPIRATORY_TRACT | Status: DC
  Administered 2019-03-18 – 2019-03-28 (×9): 1 via RESPIRATORY_TRACT
  Filled 2019-03-18: qty 28

## 2019-03-18 MED ORDER — ATORVASTATIN CALCIUM 40 MG PO TABS
40.0000 mg | ORAL_TABLET | Freq: Every day | ORAL | Status: DC
  Administered 2019-03-18 – 2019-03-28 (×11): 40 mg via ORAL
  Filled 2019-03-18 (×12): qty 1

## 2019-03-18 MED ORDER — SODIUM CHLORIDE 0.9% FLUSH
3.0000 mL | Freq: Two times a day (BID) | INTRAVENOUS | Status: DC
  Administered 2019-03-18 – 2019-03-27 (×10): 3 mL via INTRAVENOUS

## 2019-03-18 MED ORDER — PANTOPRAZOLE SODIUM 40 MG PO TBEC
40.0000 mg | DELAYED_RELEASE_TABLET | Freq: Every day | ORAL | Status: DC
  Administered 2019-03-18 – 2019-03-28 (×11): 40 mg via ORAL
  Filled 2019-03-18 (×4): qty 1
  Filled 2019-03-18: qty 2
  Filled 2019-03-18 (×7): qty 1

## 2019-03-18 MED ORDER — SERTRALINE HCL 100 MG PO TABS
100.0000 mg | ORAL_TABLET | Freq: Every day | ORAL | Status: DC
  Administered 2019-03-18 – 2019-03-28 (×11): 100 mg via ORAL
  Filled 2019-03-18 (×3): qty 1
  Filled 2019-03-18: qty 2
  Filled 2019-03-18: qty 1
  Filled 2019-03-18: qty 2
  Filled 2019-03-18 (×3): qty 1
  Filled 2019-03-18: qty 2
  Filled 2019-03-18 (×2): qty 1

## 2019-03-18 MED ORDER — ALPRAZOLAM 0.5 MG PO TABS
0.5000 mg | ORAL_TABLET | Freq: Three times a day (TID) | ORAL | Status: DC | PRN
  Administered 2019-03-18 – 2019-03-19 (×4): 0.5 mg via ORAL
  Filled 2019-03-18 (×4): qty 1

## 2019-03-18 MED ORDER — APIXABAN 5 MG PO TABS
5.0000 mg | ORAL_TABLET | Freq: Two times a day (BID) | ORAL | Status: DC
  Administered 2019-03-18 – 2019-03-23 (×12): 5 mg via ORAL
  Filled 2019-03-18 (×13): qty 1

## 2019-03-18 MED ORDER — INSULIN ASPART 100 UNIT/ML ~~LOC~~ SOLN
0.0000 [IU] | Freq: Three times a day (TID) | SUBCUTANEOUS | Status: DC
  Administered 2019-03-18: 1 [IU] via SUBCUTANEOUS
  Administered 2019-03-18 (×2): 3 [IU] via SUBCUTANEOUS
  Administered 2019-03-19 (×2): 2 [IU] via SUBCUTANEOUS
  Administered 2019-03-19: 5 [IU] via SUBCUTANEOUS
  Administered 2019-03-20: 3 [IU] via SUBCUTANEOUS
  Administered 2019-03-20 (×2): 2 [IU] via SUBCUTANEOUS
  Administered 2019-03-21: 1 [IU] via SUBCUTANEOUS

## 2019-03-18 MED ORDER — FLECAINIDE ACETATE 50 MG PO TABS
100.0000 mg | ORAL_TABLET | Freq: Two times a day (BID) | ORAL | Status: DC
  Filled 2019-03-18: qty 2

## 2019-03-18 MED ORDER — ATENOLOL 50 MG PO TABS
50.0000 mg | ORAL_TABLET | Freq: Every day | ORAL | Status: DC
  Administered 2019-03-18 – 2019-03-20 (×3): 50 mg via ORAL
  Filled 2019-03-18 (×2): qty 2
  Filled 2019-03-18: qty 1
  Filled 2019-03-18: qty 2

## 2019-03-18 MED ORDER — ALBUTEROL SULFATE (2.5 MG/3ML) 0.083% IN NEBU: 3.0000 mL | INHALATION_SOLUTION | Freq: Four times a day (QID) | RESPIRATORY_TRACT | Status: DC | PRN

## 2019-03-18 MED ORDER — SODIUM CHLORIDE 0.9% FLUSH: 3.0000 mL | INTRAVENOUS | Status: DC | PRN

## 2019-03-18 MED ORDER — ACETAMINOPHEN 650 MG RE SUPP: 650.0000 mg | Freq: Four times a day (QID) | RECTAL | Status: DC | PRN

## 2019-03-18 NOTE — H&P (Addendum)
TRH H&P    Patient Demographics:    Ellen Jordan, is a 74 y.o. female  MRN: [id number]  DOB - 02/08/1945  Admit Date - 03/17/2019  Referring MD/NP/PA: Aundria Mems  Outpatient Primary MD for the patient is [name], MD  Patient coming from: Home  Chief complaint-shortness of breath   HPI:    Ellen Jordan  is a 74 y.o. female, with history of chronic pain atrial fibrillation, on chronic anticoagulation with Eliquis, diabetes mellitus type 2, anxiety, hypertension, pulmonary hypertension, came to hospital with worsening shortness of breath.  Patient says that shortness of breath got worse over the past few days.  She also complains of rectal bleeding tonight that occurs occasionally when she has hard bowel movement.  Denies chest pain, abdominal pain or dysuria.  She is followed by pulmonology for COPD and medication induced interstitial lung disease. Today in the ED she was found to have acute kidney injury with creatinine 2.99, her baseline creatinine was 1.4.  Chest x-ray shows mild pulmonary vascular congestion. She denies nausea and vomiting. Denies fever or chills. Patient was given 500 cc normal saline bolus and started on IV normal saline at 75 mL/h.   Review of systems:    In addition to the HPI above,   All other systems reviewed and are negative.    Past History of the following :    Past Medical History:  Diagnosis Date  . AKI (acute kidney injury) ([place]) 03/18/2019  . Allergic rhinitis   . Allergy   . Anxiety   . Atrial fibrillation ([place])   . Carpal tunnel syndrome    left  . Cataract   . Depression   . Diabetic neuropathy ([place])   . Dyspnea   . Essential hypertension   . GERD (gastroesophageal reflux disease)   . Mixed hyperlipidemia   . Normocytic anemia 12/18/2018  . PAF (paroxysmal atrial fibrillation) ([place])    a. s/p TEE cardioversion March 2017. b. Recurrence by event  monitor 04/2016.  Marland Kitchen Pneumonia   . PONV (postoperative nausea and vomiting)    after previous bronchoscopy  . RBBB   . Restless leg syndrome   . Type 2 diabetes mellitus ([place])   . Uterine cancer [facility])       Past Surgical History:  Procedure Laterality Date  . ABDOMINAL HYSTERECTOMY     uterine cancer  . BACK SURGERY    . CARDIOVERSION N/A 03/11/2015   Procedure: CARDIOVERSION;  Surgeon: [name], MD;  Location: [facility];  Service: Cardiovascular;  Laterality: N/A;  . CHOLECYSTECTOMY    . EYE SURGERY     cataracts  . FLEXIBLE BRONCHOSCOPY N/A 07/30/2018   Procedure: FLEXIBLE BRONCHOSCOPY;  Surgeon: Sinda Du, MD;  [facility];  Service: Cardiopulmonary;  Laterality: N/A;  . SPINE SURGERY    . TEE WITHOUT CARDIOVERSION N/A 03/11/2015   Procedure: TRANSESOPHAGEAL ECHOCARDIOGRAM (TEE);  Surgeon: [name], MD;  Location: [facility];  Service: Cardiovascular;  Laterality: N/A;  . VIDEO BRONCHOSCOPY WITH ENDOBRONCHIAL ULTRASOUND N/A 12/19/2018  Procedure: VIDEO BRONCHOSCOPY WITH ENDOBRONCHIAL ULTRASOUND;  Surgeon: [name], MD;  Location: [facility] OR;  Service: Thoracic;  Laterality: N/A;      Social History:      Social History   Tobacco Use  . Smoking status: Former Smoker    Packs/day: 1.00    Years: 50.00    Pack years: 50.00    Types: Cigarettes    Start date: 01/10/1961    Quit date: 01/10/2005    Years since quitting: 14.1  . Smokeless tobacco: Former Systems developer    Quit date: 01/10/2005  Substance Use Topics  . Alcohol use: No    Alcohol/week: 0.0 standard drinks       Family History :     Family History  Problem Relation Age of Onset  . Heart disease Mother 36  . Hyperlipidemia Mother   . Diabetes Brother   . COPD Father 63       emphysema  . Hypertension Father   . Hyperlipidemia Father   . Diabetes Daughter   . AAA (abdominal aortic aneurysm) Son   . Diabetes Brother   . Diabetes Brother   . Diabetes Brother        Home Medications:   Prior to Admission medications   Medication Sig Start Date End Date Taking? Authorizing Provider  Accu-Chek FastClix Lancets MISC USE TO TEST BLOOD GLUCOSE FOUR TIMES DAILY 02/14/19   Cassandria Anger, MD  ACCU-CHEK GUIDE test strip USE AS INSTRUCTED FOUR TIMES DAILY 11/06/18   Nida, Marella Chimes, MD  ACCU-CHEK SMARTVIEW test strip CHECK BLOOD GLUCOSE FOUR TIMES DAILY 03/07/16   Cassandria Anger, MD  albuterol (PROVENTIL HFA;VENTOLIN HFA) 108 (90 Base) MCG/ACT inhaler Inhale 2 puffs into the lungs every 6 (six) hours as needed for wheezing or shortness of breath. 06/07/17   Caren Macadam, MD  Alcohol Swabs (B-D SINGLE USE SWABS REGULAR) PADS USE FOUR TIMES DAILY 12/04/18   Cassandria Anger, MD  atenolol (TENORMIN) 50 MG tablet TAKE 1 TABLET EVERY DAY 09/04/17   Caren Macadam, MD  atorvastatin (LIPITOR) 40 MG tablet TAKE 1 TABLET EVERY DAY 07/16/18   Satira Sark, MD  b complex vitamins tablet Take 1 tablet by mouth daily.    [provider]  Biotin 5000 MCG CAPS Take 1 capsule by mouth daily.    [provider]  Blood Glucose Monitoring Suppl (ACCU-CHEK GUIDE) w/Device KIT 1 each by Does not apply route 4 (four) times daily. 02/21/17   Cassandria Anger, MD  Calcium-Phosphorus-Vitamin D (CALCIUM GUMMIES PO) Take 1 tablet by mouth daily. [phone]    [provider]  chlorpheniramine (EQ CHLORTABS) 4 MG tablet Take 4 mg by mouth 2 (two) times daily as needed for allergies.    [provider]  cholecalciferol (VITAMIN D) 1000 units tablet Take 1,000 Units by mouth daily.    [provider]  Continuous Blood Gluc Sensor (FREESTYLE LIBRE 14 DAY SENSOR) MISC Inject 1 each into the skin every 14 (fourteen) days. Use as directed. 02/19/19   Cassandria Anger, MD  diltiazem (CARDIZEM CD) 180 MG 24 hr capsule TAKE 1 CAPSULE EVERY DAY (DOSE INCREASED 01/04/18) 01/14/19   Satira Sark, MD  diphenhydrAMINE (BENADRYL)  25 MG tablet Take 25 mg by mouth daily.    [provider]  ELIQUIS 5 MG TABS tablet TAKE 1 TABLET TWICE DAILY 09/05/18   Satira Sark, MD  flecainide (TAMBOCOR) 100 MG tablet TAKE 1 TABLET TWICE DAILY (  DOSE INCREASE ) 11/05/18   Satira Sark, MD  fluticasone furoate-vilanterol (BREO ELLIPTA) 100-25 MCG/INH AEPB Inhale 1 puff into the lungs daily. 09/12/18   [name], MD  glucose blood (ACCU-CHEK GUIDE) test strip Use as instructed 4 x daily 02/21/17   Cassandria Anger, MD  insulin aspart (NOVOLOG) 100 UNIT/ML injection Inject 10-16 Units into the skin 3 (three) times daily with meals. 05/17/18   Cassandria Anger, MD  insulin detemir (LEVEMIR) 100 UNIT/ML injection Inject 0.5 mLs (50 Units total) into the skin at bedtime. 07/21/16   Cassandria Anger, MD  ipratropium-albuterol (DUONEB) 0.5-2.5 (3) MG/3ML SOLN Take 3 mLs by nebulization every 6 (six) hours as needed (wheezing/ shortness of breath).    [provider]  liraglutide (VICTOZA) 18 MG/3ML SOPN Inject 0.3 mLs (1.8 mg total) into the skin daily. 08/15/18   Cassandria Anger, MD  lisinopril (PRINIVIL,ZESTRIL) 20 MG tablet TAKE 1 TABLET (20 MG TOTAL) BY MOUTH DAILY. Patient taking differently: Take 10 mg by mouth daily. Patient is currently taking a 1/2 tablet due to low BP 09/04/17   Caren Macadam, MD  Misc Natural Products (ESTROVEN + ENERGY MAX STRENGTH) TABS Take 1 tablet by mouth daily.    [provider]  Multiple Vitamin (MULTIVITAMIN) tablet Take 1 tablet by mouth daily.    [provider]  Omega-3 Fatty Acids (FISH OIL) 1200 MG CAPS Take 1 capsule by mouth daily.    [provider]  omeprazole (PRILOSEC) 20 MG capsule TAKE 1 CAPSULE EVERY DAY 02/13/19   Satira Sark, MD  sertraline (ZOLOFT) 100 MG tablet TAKE 1 TABLET (100 MG TOTAL) BY MOUTH DAILY. 09/04/17   Caren Macadam, MD  torsemide (DEMADEX) 20 MG tablet Take 20 mg by mouth daily. 04/03/18   [provider]  vitamin E (VITAMIN E) 400 UNIT capsule Take 400 Units by mouth daily.    [provider]     Allergies:     Allergies  Allergen Reactions  . Penicillins Hives    Has patient had a PCN reaction causing immediate rash, facial/tongue/throat swelling, SOB or lightheadedness with hypotension: Yes Has patient had a PCN reaction causing severe rash involving mucus membranes or skin necrosis: No Has patient had a PCN reaction that required hospitalization No Has patient had a PCN reaction occurring within the last 10 years: No If all of the above answers are "NO", then may proceed with Cephalosporin use.      Physical Exam:   Vitals  Blood pressure 132/85, pulse (!) 106, temperature (!) 97.5 F (36.4 C), resp. rate 14, height _0  (1.6 m), weight 79.4 kg, SpO2 100 %.  1.  General: Appears in no acute distress  2. Psychiatric: Alert, oriented x3, intact insight and judgment  3. Neurologic: Cranial nerves II through XII grossly intact, no focal deficit noted  4. HEENMT:  Atraumatic normocephalic, extraocular muscles are intact  5. Respiratory : Bibasilar crackles noted, left more than right  6. Cardiovascular : S1-S2, regular, no murmur auscultated  7. Gastrointestinal:  Abdomen is soft, nontender, no organomegaly     Data Review:    CBC Recent Labs  Lab 03/17/19 2212  WBC 10.8*  HGB 9.4*  HCT 30.3*  PLT 176  MCV 97.4  MCH 30.2  MCHC 31.0  RDW 15.1  LYMPHSABS 0.6*  MONOABS 0.6  EOSABS 0.0  BASOSABS 0.0   ------------------------------------------------------------------------------------------------------------------  Results for orders placed or performed during the hospital encounter of  03/17/19 (from the past 48 hour(s))  CBC with Differential/Platelet     Status: Abnormal   Collection Time: 03/17/19 10:12 PM  Result Value Ref Range   WBC 10.8 (H) 4.0 - 10.5 K/uL   RBC 3.11 (L) 3.87 - 5.11 MIL/uL   Hemoglobin 9.4 (L) 12.0 -  15.0 g/dL   HCT 30.3 (L) 36.0 - 46.0 %   MCV 97.4 80.0 - 100.0 fL   MCH 30.2 26.0 - 34.0 pg   MCHC 31.0 30.0 - 36.0 g/dL   RDW 15.1 11.5 - 15.5 %   Platelets 176 150 - 400 K/uL   nRBC 0.0 0.0 - 0.2 %   Neutrophils Relative % 89 %   Neutro Abs 9.5 (H) 1.7 - 7.7 K/uL   Lymphocytes Relative 5 %   Lymphs Abs 0.6 (L) 0.7 - 4.0 K/uL   Monocytes Relative 5 %   Monocytes Absolute 0.6 0.1 - 1.0 K/uL   Eosinophils Relative 0 %   Eosinophils Absolute 0.0 0.0 - 0.5 K/uL   Basophils Relative 0 %   Basophils Absolute 0.0 0.0 - 0.1 K/uL   Immature Granulocytes 1 %   Abs Immature Granulocytes 0.07 0.00 - 0.07 K/uL    Comment: Performed at [facility], [address], [place], [place] [address]  Comprehensive metabolic panel     Status: Abnormal   Collection Time: 03/17/19 10:12 PM  Result Value Ref Range   Sodium 128 (L) 135 - 145 mmol/L   Potassium 4.3 3.5 - 5.1 mmol/L   Chloride 93 (L) 98 - 111 mmol/L   CO2 22 22 - 32 mmol/L   Glucose, Bld 203 (H) 70 - 99 mg/dL    Comment: Glucose reference range applies only to samples taken after fasting for at least 8 hours.   BUN 44 (H) 8 - 23 mg/dL   Creatinine, Ser 2.99 (H) 0.44 - 1.00 mg/dL   Calcium 8.6 (L) 8.9 - 10.3 mg/dL   Total Protein 7.2 6.5 - 8.1 g/dL   Albumin 3.8 3.5 - 5.0 g/dL   AST 28 15 - 41 U/L   ALT 20 0 - 44 U/L   Alkaline Phosphatase 63 38 - 126 U/L   Total Bilirubin 0.6 0.3 - 1.2 mg/dL   GFR calc non Af Amer 15 (L) >60 mL/min   GFR calc Af Amer 17 (L) >60 mL/min   Anion gap 13 5 - 15    Comment: Performed at [facility], [address], [place], [place] [address]  Brain natriuretic peptide     Status: Abnormal   Collection Time: 03/17/19 10:13 PM  Result Value Ref Range   B Natriuretic Peptide 338.0 (H) 0.0 - 100.0 pg/mL    Comment: Performed at [facility], [address] [place], [place] [address]  Type and screen [facility]     Status: None   Collection Time: 03/17/19 10:13 PM  Result Value Ref Range    ABO/RH(D) O POS    Antibody Screen NEG    Sample Expiration      03/20/2019,2359 Performed at [facility], [address], [place], [place] [address]   Troponin I (High Sensitivity)     Status: None   Collection Time: 03/17/19 10:13 PM  Result Value Ref Range   Troponin I (High Sensitivity) 8 <18 ng/L    Comment: (NOTE) Elevated high sensitivity troponin I (hsTnI) values and significant  changes across serial measurements may suggest ACS but many other  chronic and acute conditions  are known to elevate hsTnI results.  Refer to the "Links" section for chest pain algorithms and additional  guidance. Performed at [facility], [address], [place],  [address]   Troponin I (High Sensitivity)     Status: None   Collection Time: 03/18/19  1:15 AM  Result Value Ref Range   Troponin I (High Sensitivity) 8 <18 ng/L    Comment: (NOTE) Elevated high sensitivity troponin I (hsTnI) values and significant  changes across serial measurements may suggest ACS but many other  chronic and acute conditions are known to elevate hsTnI results.  Refer to the "Links" section for chest pain algorithms and additional  guidance. Performed at [facility], [address] [place],  [address]     Chemistries  Recent Labs  Lab 03/17/19 2212  NA 128*  K 4.3  CL 93*  CO2 22  GLUCOSE 203*  BUN 44*  CREATININE 2.99*  CALCIUM 8.6*  AST 28  ALT 20  ALKPHOS 63  BILITOT 0.6   ------------------------------------------------------------------------------------------------------------------  ------------------------------------------------------------------------------------------------------------------ GFR: Estimated Creatinine Clearance: 16.2 mL/min (A) (by C-G formula based on SCr of 2.99 mg/dL (H)). Liver Function Tests: Recent Labs  Lab 03/17/19 2212  AST 28  ALT 20  ALKPHOS 63  BILITOT 0.6  PROT 7.2  ALBUMIN 3.8   No results for input(s): LIPASE, AMYLASE in the last 168  hours. No results for input(s): AMMONIA in the last 168 hours. Coagulation Profile: No results for input(s): INR, PROTIME in the last 168 hours. Cardiac Enzymes: No results for input(s): CKTOTAL, CKMB, CKMBINDEX, TROPONINI in the last 168 hours. BNP (last 3 results) Recent Labs    09/12/18 1201  PROBNP 51.0   HbA1C: No results for input(s): HGBA1C in the last 72 hours. CBG: No results for input(s): GLUCAP in the last 168 hours. Lipid Profile: No results for input(s): CHOL, HDL, LDLCALC, TRIG, CHOLHDL, LDLDIRECT in the last 72 hours. Thyroid Function Tests: No results for input(s): TSH, T4TOTAL, FREET4, T3FREE, THYROIDAB in the last 72 hours. Anemia Panel: No results for input(s): VITAMINB12, FOLATE, FERRITIN, TIBC, IRON, RETICCTPCT in the last 72 hours.  --------------------------------------------------------------------------------------------------------------- Urine analysis: No results found for: COLORURINE, APPEARANCEUR, LABSPEC, PHURINE, GLUCOSEU, HGBUR, BILIRUBINUR, KETONESUR, PROTEINUR, UROBILINOGEN, NITRITE, LEUKOCYTESUR    Imaging Results:    DG Chest Port 1 View  Result Date: 03/17/2019 CLINICAL DATA:  74 year old female with shortness of breath. EXAM: PORTABLE CHEST 1 VIEW COMPARISON:  Chest CT dated 03/01/2019. FINDINGS: There is background of chronic interstitial coarsening. Mild hazy airspace density primarily involving the mid to lower lung field likely combination of vascular congestion and areas of air trapping or small airway disease. Pneumonia is less likely but not excluded. Clinical correlation is recommended. No lobar consolidation, pleural effusion, or pneumothorax. There is mild cardiomegaly with mild vascular congestion. Atherosclerotic calcification of the aorta. No acute osseous pathology. IMPRESSION: 1. Mild cardiomegaly with mild vascular congestion. 2. Probable combination of air trapping and small airway disease. Pneumonia is less likely but not  excluded. Electronically Signed   By: Anner Crete M.D.   On: 03/17/2019 22:09    My personal review of EKG: Rhythm atrial fibrillation   Assessment & Plan:    Active Problems:   AKI (acute kidney injury) ([place])   1. Acute kidney injury on CKD stage III-patient baseline creatinine is around 1.5, today presented with creatinine of 2.99.  Likely from diuretic use.  Patient was given IV fluid bolus 500 cc normal saline in the ED.  Also  started on normal saline at 75 mL/h.  I will hold IV fluids at this time as there is high likelihood that patient will go into CHF exacerbation.  Will hold IV fluids at this time.  We will also hold diuretics and ACE inhibitor at this time. 2. Dyspnea on exertion-likely multifactorial from underlying ILD, COPD and CHF.  Continue home medications including DuoNeb nebulizer, Breo Ellipta 1 puff into the lungs daily. 3. Chronic atrial fibrillation-continue Eliquis, Cardizem, flecainide 4. Acute on chronic diastolic CHF-patient has bibasilar crackles, BNP is 338.  Torsemide on hold due to worsening renal function.  Will consult cardiology in a.m. for further recommendations. 5. Diabetes mellitus type 2-continue Levemir, start sliding scale insulin with NovoLog.    DVT Prophylaxis-  Eliquis  AM Labs Ordered, also please review Full Orders  Family Communication: Admission, patients condition and plan of care including tests being ordered have been discussed with the patient  who indicate understanding and agree with the plan and Code Status.  Code Status: DNR  Admission status: Inpatient :The appropriate admission status for this patient is INPATIENT. Inpatient status is judged to be reasonable and necessary in order to provide the required intensity of service to ensure the patient's safety. The patient's presenting symptoms, physical exam findings, and initial radiographic and laboratory data in the context of their chronic comorbidities is felt to place them at  high risk for further clinical deterioration. Furthermore, it is not anticipated that the patient will be medically stable for discharge from the hospital within 2 midnights of admission. The following factors support the admission status of inpatient.     The patient's presenting symptoms include shortness of breath. The worrisome physical exam findings include bibasilar crackles. The initial radiographic and laboratory data are worrisome because of acute kidney injury. The chronic co-morbidities include COPD, CHF.       * I certify that at the point of admission it is my clinical judgment that the patient will require inpatient hospital care spanning beyond 2 midnights from the point of admission due to high intensity of service, high risk for further deterioration and high frequency of surveillance required.*  Time spent in minutes : 60 minutes   Dorathea Faerber S Blakelyn Dinges M.D

## 2019-03-18 NOTE — Progress Notes (Signed)
Patient seen and examined.  Admitted after midnight secondary to shortness of breath, generalized weakness and heart racing feeling.  Patient evaluation has demonstrated acute on chronic renal failure with a creatinine of 2.99; patient is state that she has an increase in her outpatient dose of Demadex recently that could be the culprit of worsening renal function.  She denies chest pain but expressed feeling more short of breath and with heart racing sensation.  Troponin negative x3.  Telemetry evaluation and repeat EKG with findings compatible for atrial flutter with interval QRS widening in comparison to previous tracing.  Appreciate assistance by cardiology service, follow the recommendations.  Please refer to H&P written by Dr. Darrick Meigs for further info/details on presentation.  Plan: -Check 2D echo -Stop flecainide -Follow electrolytes for stability and replete as needed. -Continue Eliquis -Continue holding diuretics and any other nephrotoxic agents -Follow renal function trend and the stability. -Follow cardiology service recommendations.  Barton Dubois MD [phone]

## 2019-03-18 NOTE — ED Notes (Signed)
Spoke with pt's husband, Sumeya Yontz and updated him on pt's condition to include probable admission. Informed husband that we would call him with more details such as room number, etc... as they become available.

## 2019-03-18 NOTE — Consult Note (Signed)
Cardiology Consultation:   Patient ID: Ellen Jordan; [id number]; August 01, 1945   Admit date: 03/17/2019 Date of Consult: 03/18/2019  Primary Care Provider: [name], MD Primary Cardiologist: Ellen Lesches, MD Primary Electrophysiologist: None   Patient Profile:   Ellen Jordan is a 74 y.o. female with a history of persistent atrial fibrillation, type 2 diabetes mellitus, mixed hyperlipidemia, hypertension, chronic hypoxic respiratory failure with COPD and ILD followed by Pulmonary who is being seen today for the evaluation of elevated heart rate and abnormal ECG at the request of Ellen Jordan.  History of Present Illness:   Ellen Jordan presents to the hospital reporting worsening shortness of breath over the last few weeks, within the last week has been having episodes of weakness and a few falls.  She also indicates that her heart rate has been elevated, more of a sense of palpitations.  She reports compliance with her home oxygen and had a recent follow-up with Pulmonary in February.  She was last seen in the cardiology office over a year ago.  Work-up so far shows acute on chronic renal failure with creatinine up to 2.99, she does state that she has increased her outpatient dose of Demadex recently.  Chest x-ray demonstrates mild cardiomegaly with vascular congestion.  Rhythm looks to be probable atrial flutter, there has been interval QRS widening compared to prior tracings.  This was initially interpreted per computer as a STEMI, however the patient does not report any active chest pain and her high-sensitivity troponin I levels have been normal.  Past Medical History:  Diagnosis Date  . AKI (acute kidney injury) ([place]) 03/18/2019  . Allergic rhinitis   . Allergy   . Anxiety   . Atrial fibrillation ([place])   . Carpal tunnel syndrome    left  . Cataract   . Depression   . Diabetic neuropathy ([place])   . Dyspnea   . Essential hypertension   . GERD (gastroesophageal reflux disease)   .  Mixed hyperlipidemia   . Normocytic anemia 12/18/2018  . PAF (paroxysmal atrial fibrillation) ([place])    a. s/p TEE cardioversion March 2017. b. Recurrence by event monitor 04/2016.  Ellen Jordan   . PONV (postoperative nausea and vomiting)    after previous bronchoscopy  . RBBB   . Restless leg syndrome   . Type 2 diabetes mellitus ([place])   . Uterine cancer [facility])     Past Surgical History:  Procedure Laterality Date  . ABDOMINAL HYSTERECTOMY     uterine cancer  . BACK SURGERY    . CARDIOVERSION N/A 03/11/2015   Procedure: CARDIOVERSION;  Surgeon: Ellen Spark, MD;  Location: [place] [facility];  Service: Cardiovascular;  Laterality: N/A;  . CHOLECYSTECTOMY    . EYE SURGERY     cataracts  . FLEXIBLE BRONCHOSCOPY N/A 07/30/2018   Procedure: FLEXIBLE BRONCHOSCOPY;  Surgeon: Ellen Du, MD;  [facility];  Service: Cardiopulmonary;  Laterality: N/A;  . SPINE SURGERY    . TEE WITHOUT CARDIOVERSION N/A 03/11/2015   Procedure: TRANSESOPHAGEAL ECHOCARDIOGRAM (TEE);  Surgeon: Ellen Spark, MD;  Location: [facility];  Service: Cardiovascular;  Laterality: N/A;  . VIDEO BRONCHOSCOPY WITH ENDOBRONCHIAL ULTRASOUND N/A 12/19/2018   Procedure: VIDEO BRONCHOSCOPY WITH ENDOBRONCHIAL ULTRASOUND;  Surgeon: Ellen Furbish, MD;  Location: [facility];  Service: Thoracic;  Laterality: N/A;     Inpatient Medications: Scheduled Meds: . apixaban  5 mg Oral BID  . atenolol  50 mg Oral Daily  . atorvastatin  40  mg Oral Daily  . diltiazem  180 mg Oral Daily  . flecainide  100 mg Oral Q12H  . fluticasone furoate-vilanterol  1 puff Inhalation Daily  . insulin aspart  0-9 Units Subcutaneous TID WC  . insulin detemir  50 Units Subcutaneous QHS  . mouth rinse  15 mL Mouth Rinse BID  . pantoprazole  40 mg Oral Daily  . sertraline  100 mg Oral Daily  . sodium chloride flush  3 mL Intravenous Q12H   Continuous Infusions: . sodium chloride     PRN Meds: sodium chloride, albuterol, ALPRAZolam,  ipratropium-albuterol, sodium chloride flush  Allergies:    Allergies  Allergen Reactions  . Penicillins Hives    Has patient had a PCN reaction causing immediate rash, facial/tongue/throat swelling, SOB or lightheadedness with hypotension: Yes Has patient had a PCN reaction causing severe rash involving mucus membranes or skin necrosis: No Has patient had a PCN reaction that required hospitalization No Has patient had a PCN reaction occurring within the last 10 years: No If all of the above answers are "NO", then may proceed with Cephalosporin use.     Social History:   Social History   Socioeconomic History  . Marital status: Married    Spouse name: Ellen Jordan  . Number of children: 2  . Years of education: 94  . Highest education level: Not on file  Occupational History  . Occupation: retired  Tobacco Use  . Smoking status: Former Smoker    Packs/day: 1.00    Years: 50.00    Pack years: 50.00    Types: Cigarettes    Start date: 01/10/1961    Quit date: 01/10/2005    Years since quitting: 14.1  . Smokeless tobacco: Former Systems developer    Quit date: 01/10/2005  Substance and Sexual Activity  . Alcohol use: No    Alcohol/week: 0.0 standard drinks  . Drug use: No  . Sexual activity: Not Currently    Partners: Male  Other Topics Concern  . Not on file  Social History Narrative   Moved to this area in 54 from [place].    Married for over 50 years. Has two children. Lives with husband Ellen Jordan (Ellen Jordan)   Likes to read   Retired from [employer].   Enjoys time with family, beach, read, exericse.   Walk dog.   Eats all food groups.   Wear seatbelt.   Wear sunscreen.   Social Determinants of Health   Financial Resource Strain:   . Difficulty of Paying Living Expenses: Not on file  Food Insecurity:   . Worried About Charity fundraiser in the Last Year: Not on file  . Ran Out of Food in the Last Year: Not on file  Transportation Needs:   . Lack of Transportation (Medical): Not  on file  . Lack of Transportation (Non-Medical): Not on file  Physical Activity:   . Days of Exercise per Week: Not on file  . Minutes of Exercise per Session: Not on file  Stress:   . Feeling of Stress : Not on file  Social Connections:   . Frequency of Communication with Friends and Family: Not on file  . Frequency of Social Gatherings with Friends and Family: Not on file  . Attends Religious Services: Not on file  . Active Member of Clubs or Organizations: Not on file  . Attends Archivist Meetings: Not on file  . Marital Status: Not on file  Intimate Partner Violence:   . Fear  of Current or Ex-Partner: Not on file  . Emotionally Abused: Not on file  . Physically Abused: Not on file  . Sexually Abused: Not on file    Family History:   The patient's family history includes AAA (abdominal aortic aneurysm) in her son; COPD (age of onset: 54) in her father; Diabetes in her brother, brother, brother, brother, and daughter; Heart disease (age of onset: 60) in her mother; Hyperlipidemia in her father and mother; Hypertension in her father.  ROS:  Please see the history of present illness.  No cough, fevers or chills.  All other ROS reviewed and negative.     Physical Exam/Data:   Vitals:   03/18/19 0353 03/18/19 0604 03/18/19 0654 03/18/19 0752  BP: 117/60 105/61 111/75   Pulse: (!) 110 (!) 107 (!) 111   Resp: 20 20    Temp: 97.6 F (36.4 C)     TempSrc: Oral     SpO2: 100% 100% 100% 99%  Weight:      Height:        Intake/Output Summary (Last 24 hours) at 03/18/2019 0938 Last data filed at 03/18/2019 0900 Gross per 24 hour  Intake 905.48 ml  Output --  Net 905.48 ml   Filed Weights   03/17/19 2106  Weight: 79.4 kg   Body mass index is 31 kg/m.   Gen: Elderly woman, no distress. HEENT: Conjunctiva and lids normal, oropharynx clear. Neck: Supple, elevated JVP, no carotid bruits. Lungs: Coarse breath sounds, no active wheezing. Cardiac: Largely regular  rhythm, distant heart sounds,, no S3, no pericardial rub. Abdomen: Protuberant, nontender, bowel sounds present. Extremities: Trace ankle edema, distal pulses 2+. Skin: Warm and dry. Musculoskeletal: No kyphosis. Neuropsychiatric: Alert and oriented x3, affect grossly appropriate.  EKG:  An ECG dated 03/18/2019 was personally reviewed today and demonstrated:  Probable atrial flutter with 2:1 block, QRS widening relative to prior tracings.  Telemetry:  I personally reviewed telemetry which shows probable atrial flutter with variable conduction.  Relevant CV Studies:  Echocardiogram 02/23/2018: 1. The left ventricle has normal systolic function with an ejection  fraction of 60-65%. The cavity size was normal. There is mildly increased  left ventricular wall thickness. Left ventricular diastolic Doppler  parameters are consistent with impaired  relaxation Elevated mean left atrial pressure.  2. The right ventricle has normal systolic function. The cavity was  mildly enlarged. There is no increase in right ventricular wall thickness.  3. There is systolic flattening of the ventricular septum suggesting  elevated RV pressure.  4. Left atrial size was severely dilated.  5. Right atrial size was severely dilated.  6. Diffuse calcification of MV leaflets. Severe thickening of the mitral  valve leaflet. Mild mitral valve stenosis.  7. The tricuspid valve is normal in structure.  8. The aortic valve is tricuspid Mild thickening of the aortic valve no  stenosis of the aortic valve.  9. Pulmonary hypertension is moderately elevated, PASP 59.  10. Right atrial pressure is estimated at 3 mmHg.   Laboratory Data:  Chemistry Recent Labs  Lab 03/17/19 2212 03/18/19 0505  NA 128* 131*  K 4.3 5.0  CL 93* 96*  CO2 22 19*  GLUCOSE 203* 197*  BUN 44* 46*  CREATININE 2.99* 2.83*  CALCIUM 8.6* 8.6*  GFRNONAA 15* 16*  GFRAA 17* 18*  ANIONGAP 13 16*    Recent Labs  Lab 03/17/19 2212  03/18/19 0505  PROT 7.2 7.1  ALBUMIN 3.8 3.7  AST 28 30  ALT  20 18  ALKPHOS 63 62  BILITOT 0.6 0.6   Hematology Recent Labs  Lab 03/17/19 2212 03/18/19 0505  WBC 10.8* 9.7  RBC 3.11* 3.18*  HGB 9.4* 9.5*  HCT 30.3* 31.5*  MCV 97.4 99.1  MCH 30.2 29.9  MCHC 31.0 30.2  RDW 15.1 15.1  PLT 176 192   Cardiac Enzymes Recent Labs  Lab 03/17/19 2213 03/18/19 0115 03/18/19 0702 03/18/19 0847  TROPONINIHS _0 BNP Recent Labs  Lab 03/17/19 2213  BNP 338.0*    Radiology/Studies:  DG Chest Port 1 View  Result Date: 03/17/2019 CLINICAL DATA:  74 year old female with shortness of breath. EXAM: PORTABLE CHEST 1 VIEW COMPARISON:  Chest CT dated 03/01/2019. FINDINGS: There is background of chronic interstitial coarsening. Mild hazy airspace density primarily involving the mid to lower lung field likely combination of vascular congestion and areas of air trapping or small airway disease. Jordan is less likely but not excluded. Clinical correlation is recommended. No lobar consolidation, pleural effusion, or pneumothorax. There is mild cardiomegaly with mild vascular congestion. Atherosclerotic calcification of the aorta. No acute osseous pathology. IMPRESSION: 1. Mild cardiomegaly with mild vascular congestion. 2. Probable combination of air trapping and small airway disease. Jordan is less likely but not excluded. Electronically Signed   By: Anner Crete M.D.   On: 03/17/2019 22:09    Assessment and Plan:   1.  History of persistent atrial fibrillation/flutter.  Current rhythm looks to be probable atrial flutter with variable conduction, as fast as 2:1 block.  She has had interval QRS widening as well.  On flecainide as an outpatient, also now with acute on chronic renal failure, potassium 5.0.  She has been on Eliquis for stroke prophylaxis, otherwise on atenolol and Cardizem CD for heart rate control.  Uncertain how long she has been out of rhythm.  2.  Shortness of  breath, multifactorial in the setting of atrial arrhythmias, COPD and ILD with chronic hypoxic respiratory failure.  Last LVEF was 60 to 65% as of February 2020.  She is at risk for interval cardiomyopathy in light of problem #1.  3.  Acute on chronic renal failure, creatinine is down to 2.83 from 2.99.  Most recent potassium 5.0.  She has been on Demadex 20 mg daily as an outpatient, although increase this recently due to shortness of breath.  4.  Chronic anemia (possibly of chronic disease), hemoglobin currently 9.5. No reported change in stools or spontaneous bleeding problems.  Would stop flecainide.  Agree with holding diuretics for now but she does need a follow-up echocardiogram to reassess LVEF.  Continue atenolol and Cardizem CD.  Continue Eliquis if possible, particularly in case cardioversion needs to be considered ultimately in the short-term.  Further recommendations to follow.  Signed, Ellen Lesches, MD  03/18/2019 9:38 AM

## 2019-03-18 NOTE — Progress Notes (Addendum)
Called by RN that patient's EKG shows STEMI.  Earlier patient was admitted with acute kidney injury.  She does have chronic atrial fibrillation and is on anticoagulation with Eliquis.  I called and discussed the EKG with cardiologist on-call for STEMI, Dr. Ellyn Hack.  He reviewed the EKG and he does not feel that patient is having ST elevation MI.  She does have abnormal QRS complex which is very similar to EKG obtained last night.  Her troponin x2 have been normal.  Value 8 at 1 AM. I will repeat troponin x2. Cardiology consultation will be obtained. Called and discussed with attending physician for today Dr. Dyann Kief.

## 2019-03-18 NOTE — ED Notes (Signed)
IVF stopped per verbal orders of Dr Darrick Meigs.

## 2019-03-18 NOTE — Progress Notes (Signed)
After admission to floor and being placed on tele, unable to get a steady heart rhythm on pt. Pt noted to have tremors and c/o of anxiety but denied chest pain. Attempted to re-adjust leads and medicated for anxiety. Per central tele pt appeared to be jumping in different rhythms. Notified Dr. Darrick Meigs to obtain EKG. Critical EKG of STEMI, assessed vitals which were stable and pt continues to deny chest pain. Dr. Darrick Meigs consulted with on-call cardiology who felt it was not a ST elevation MI. (see MD note) Stat troponins were drawn at this time, will continue to closely monitor. Cardiology consult to be obtained per Dr. Darrick Meigs.

## 2019-03-19 ENCOUNTER — Inpatient Hospital Stay (HOSPITAL_COMMUNITY): Payer: Medicare HMO

## 2019-03-19 DIAGNOSIS — N182 Chronic kidney disease, stage 2 (mild): Secondary | ICD-10-CM

## 2019-03-19 DIAGNOSIS — I342 Nonrheumatic mitral (valve) stenosis: Secondary | ICD-10-CM

## 2019-03-19 DIAGNOSIS — I361 Nonrheumatic tricuspid (valve) insufficiency: Secondary | ICD-10-CM

## 2019-03-19 DIAGNOSIS — I350 Nonrheumatic aortic (valve) stenosis: Secondary | ICD-10-CM

## 2019-03-19 LAB — BASIC METABOLIC PANEL
Anion gap: 14 (ref 5–15)
BUN: 59 mg/dL — ABNORMAL HIGH (ref 8–23)
CO2: 20 mmol/L — ABNORMAL LOW (ref 22–32)
Calcium: 8.4 mg/dL — ABNORMAL LOW (ref 8.9–10.3)
Chloride: 92 mmol/L — ABNORMAL LOW (ref 98–111)
Creatinine, Ser: 4.37 mg/dL — ABNORMAL HIGH (ref 0.44–1.00)
GFR calc Af Amer: 11 mL/min — ABNORMAL LOW (ref >60)
GFR calc non Af Amer: 9 mL/min — ABNORMAL LOW (ref >60)
Glucose, Bld: 218 mg/dL — ABNORMAL HIGH (ref 70–99)
Potassium: 4.4 mmol/L (ref 3.5–5.1)
Sodium: 126 mmol/L — ABNORMAL LOW (ref 135–145)

## 2019-03-19 LAB — GLUCOSE, CAPILLARY
Glucose-Capillary: 157 mg/dL — ABNORMAL HIGH (ref 70–99)
Glucose-Capillary: 260 mg/dL — ABNORMAL HIGH (ref 70–99)
Glucose-Capillary: 157 mg/dL — ABNORMAL HIGH (ref 70–99)
Glucose-Capillary: 158 mg/dL — ABNORMAL HIGH (ref 70–99)

## 2019-03-19 LAB — ECHOCARDIOGRAM COMPLETE
Height: 63 in
Weight: 2800 oz

## 2019-03-19 NOTE — Progress Notes (Signed)
Progress Note  Patient Name: Ellen Jordan Date of Encounter: 03/19/2019  Primary Cardiologist: Rozann Lesches, MD  Subjective   Patient sitting in chair, eating breakfast.  No palpitations.  Breathing comfortably at rest.  Inpatient Medications    Scheduled Meds: . apixaban  5 mg Oral BID  . atenolol  50 mg Oral Daily  . atorvastatin  40 mg Oral Daily  . diltiazem  180 mg Oral Daily  . fluticasone furoate-vilanterol  1 puff Inhalation Daily  . insulin aspart  0-9 Units Subcutaneous TID WC  . insulin detemir  50 Units Subcutaneous QHS  . mouth rinse  15 mL Mouth Rinse BID  . pantoprazole  40 mg Oral Daily  . sertraline  100 mg Oral Daily  . sodium chloride flush  3 mL Intravenous Q12H   Continuous Infusions: . sodium chloride     PRN Meds: sodium chloride, acetaminophen **OR** acetaminophen, albuterol, ALPRAZolam, ipratropium-albuterol, sodium chloride flush   Vital Signs    Vitals:   03/18/19 2035 03/18/19 2107 03/18/19 2245 03/19/19 0601  BP:  93/66 (!) 87/57 (!) 98/58  Pulse:  94  72  Resp:    20  Temp:  97.6 F (36.4 C)  (!) 97.5 F (36.4 C)  TempSrc:  Oral  Oral  SpO2: 100% 100%  97%  Weight:      Height:        Intake/Output Summary (Last 24 hours) at 03/19/2019 0910 Last data filed at 03/18/2019 1300 Gross per 24 hour  Intake 240 ml  Output --  Net 240 ml   Filed Weights   03/17/19 2106  Weight: 79.4 kg    Telemetry    Probable atrial flutter with IVCD, rate controlled.  Personally reviewed.  Physical Exam   GEN:  Elderly woman.  No acute distress.   Neck: No JVD. Cardiac:  Irregular, no gallop.  Respiratory: Nonlabored.  Coarse breath sounds, decreased at the bases. GI: Soft, nontender, bowel sounds present. MS: No pitting edema; No deformity. Neuro:  Nonfocal. Psych: Alert and oriented x 3. Normal affect.  Labs    Chemistry Recent Labs  Lab 03/17/19 2212 03/18/19 0505  NA 128* 131*  K 4.3 5.0  CL 93* 96*  CO2 22 19*   GLUCOSE 203* 197*  BUN 44* 46*  CREATININE 2.99* 2.83*  CALCIUM 8.6* 8.6*  PROT 7.2 7.1  ALBUMIN 3.8 3.7  AST 28 30  ALT 20 18  ALKPHOS 63 62  BILITOT 0.6 0.6  GFRNONAA 15* 16*  GFRAA 17* 18*  ANIONGAP 13 16*     Hematology Recent Labs  Lab 03/17/19 2212 03/18/19 0505  WBC 10.8* 9.7  RBC 3.11* 3.18*  HGB 9.4* 9.5*  HCT 30.3* 31.5*  MCV 97.4 99.1  MCH 30.2 29.9  MCHC 31.0 30.2  RDW 15.1 15.1  PLT 176 192    Cardiac Enzymes Recent Labs  Lab 03/17/19 2213 03/18/19 0115 03/18/19 0702 03/18/19 0847  TROPONINIHS _0 BNP Recent Labs  Lab 03/17/19 2213  BNP 338.0*     Radiology    DG Chest Port 1 View  Result Date: 03/17/2019 CLINICAL DATA:  74 year old female with shortness of breath. EXAM: PORTABLE CHEST 1 VIEW COMPARISON:  Chest CT dated 03/01/2019. FINDINGS: There is background of chronic interstitial coarsening. Mild hazy airspace density primarily involving the mid to lower lung field likely combination of vascular congestion and areas of air trapping or small airway disease. Pneumonia is less likely  but not excluded. Clinical correlation is recommended. No lobar consolidation, pleural effusion, or pneumothorax. There is mild cardiomegaly with mild vascular congestion. Atherosclerotic calcification of the aorta. No acute osseous pathology. IMPRESSION: 1. Mild cardiomegaly with mild vascular congestion. 2. Probable combination of air trapping and small airway disease. Pneumonia is less likely but not excluded. Electronically Signed   By: Anner Crete M.D.   On: 03/17/2019 22:09    Cardiac Studies   Follow-up echocardiogram pending.  Patient Profile     74 y.o. female with a history of persistent atrial fibrillation'flutter, type 2 diabetes mellitus, mixed hyperlipidemia, hypertension, chronic hypoxic respiratory failure with COPD and ILD followed by Pulmonary.  She presents now with shortness of breath and weakness, acute on chronic renal failure,  and RVR.  Assessment & Plan    1.  Persistent atrial fibrillation/flutter, duration uncertain.  Heart rate is better controlled today on combination of atenolol and Cardizem CD.  She is on Eliquis for stroke prophylaxis.  Flecainide was discontinued particularly in light of increased QRS duration compared to prior baseline and persistent arrhythmia.  2.  Acute on chronic renal failure, last creatinine 2.83.  Follow-up lab work pending.  Diuretics had been advanced as an outpatient per patient, presently held.  3.  Shortness of breath and weakness, likely multifactorial in the setting of atrial arrhythmias, COPD and ILD with chronic hypoxic respiratory failure.  Follow-up echocardiogram is pending to exclude interval cardiomyopathy.  Last LVEF was 60 to 65%.  Follow-up echocardiogram today as noted above.  Continue with atenolol, Cardizem CD, and Eliquis.  Follow-up ECG today and BMET. With controlled heart rates, less concern to push for cardioversion now.  Still may need to be considered however if she has developed an interval cardiomyopathy.  Increase activity as tolerated.  Signed, Rozann Lesches, MD  03/19/2019, 9:10 AM

## 2019-03-19 NOTE — Evaluation (Signed)
Physical Therapy Evaluation Patient Details Name: Ellen Jordan MRN: [id number] DOB: 1945-05-09 Today's Date: 03/19/2019   History of Present Illness  Ellen Jordan  is a 74 y.o. female, with history of chronic pain atrial fibrillation, on chronic anticoagulation with Eliquis, diabetes mellitus type 2, anxiety, hypertension, pulmonary hypertension, came to hospital with worsening shortness of breath.  Patient says that shortness of breath got worse over the past few days.  She also complains of rectal bleeding tonight that occurs occasionally when she has hard bowel movement.  Denies chest pain, abdominal pain or dysuria.  She is followed by pulmonology for COPD and medication induced interstitial lung disease.    Clinical Impression  Patient limited for functional mobility as stated below secondary to BLE weakness, fatigue and fair/poor standing balance.  Patient limited to ambulation in room, had difficulty making turns with RW due to weakness and fatigues easily with exertion and put back to bed after therapy.  Patient will benefit from continued physical therapy in hospital and recommended venue below to increase strength, balance, endurance for safe ADLs and gait.     Follow Up Recommendations SNF    Equipment Recommendations  None recommended by PT    Recommendations for Other Services       Precautions / Restrictions Precautions Precautions: Fall Restrictions Weight Bearing Restrictions: No      Mobility  Bed Mobility Overal bed mobility: Needs Assistance Bed Mobility: Supine to Sit;Sit to Supine     Supine to sit: Supervision Sit to supine: Min guard   General bed mobility comments: slow labored movement  Transfers Overall transfer level: Needs assistance Equipment used: Rolling walker (2 wheeled) Transfers: Sit to/from [facility] Sit to Stand: Min assist Stand pivot transfers: Min assist       General transfer comment: increased time, labored  movement  Ambulation/Gait Ambulation/Gait assistance: Min assist;Mod assist Gait Distance (Feet): 18 Feet Assistive device: Rolling walker (2 wheeled) Gait Pattern/deviations: Decreased step length - right;Decreased step length - left;Decreased stride length Gait velocity: decreased   General Gait Details: slow labored cadence with difficulty making turns due to weakness, limited secondary to fatigue, on 2 LPM  Stairs            Wheelchair Mobility    Modified Rankin (Stroke Patients Only)       Balance Overall balance assessment: Needs assistance Sitting-balance support: Feet supported;No upper extremity supported Sitting balance-Leahy Scale: Good Sitting balance - Comments: seated at EOB   Standing balance support: During functional activity;Bilateral upper extremity supported Standing balance-Leahy Scale: Fair Standing balance comment: using RW                             Pertinent Vitals/Pain Pain Assessment: Faces Faces Pain Scale: Hurts a little bit Pain Location: chronic low back pain Pain Descriptors / Indicators: Aching Pain Intervention(s): Limited activity within patient's tolerance;Monitored during session    [place] expects to be discharged to:: Private residence Living Arrangements: Spouse/significant other Available Help at Discharge: Family;Available PRN/intermittently Type of Home: Mobile home Home Access: Stairs to enter Entrance Stairs-Rails: Right Entrance Stairs-Number of Steps: 4 Home Layout: One level Home Equipment: Walker - 4 wheels;Shower seat;Grab bars - tub/shower      Prior Function Level of Independence: Independent with assistive device(s)         Comments: household ambulator with Rollator     Hand Dominance   Dominant Hand: Right  Extremity/Trunk Assessment   Upper Extremity Assessment Upper Extremity Assessment: Generalized weakness    Lower Extremity Assessment Lower  Extremity Assessment: Generalized weakness    Cervical / Trunk Assessment Cervical / Trunk Assessment: Normal  Communication   Communication: No difficulties  Cognition Arousal/Alertness: Awake/alert Behavior During Therapy: WFL for tasks assessed/performed Overall Cognitive Status: Within Functional Limits for tasks assessed                                        General Comments      Exercises     Assessment/Plan    PT Assessment Patient needs continued PT services  PT Problem List Decreased strength;Decreased activity tolerance;Decreased balance;Decreased mobility       PT Treatment Interventions Gait training;Stair training;Functional mobility training;Therapeutic activities;Therapeutic exercise;Patient/family education    PT Goals (Current goals can be found in the Care Plan section)  Acute Rehab PT Goals Patient Stated Goal: return home after rehab PT Goal Formulation: With patient Time For Goal Achievement: 04/02/19 Potential to Achieve Goals: Good    Frequency Min 3X/week   Barriers to discharge        Co-evaluation               AM-PAC PT "6 Clicks" Mobility  Outcome Measure Help needed turning from your back to your side while in a flat bed without using bedrails?: None Help needed moving from lying on your back to sitting on the side of a flat bed without using bedrails?: A Little Help needed moving to and from a bed to a chair (including a wheelchair)?: A Little Help needed standing up from a chair using your arms (e.g., wheelchair or bedside chair)?: A Little Help needed to walk in hospital room?: A Lot Help needed climbing 3-5 steps with a railing? : A Lot 6 Click Score: 17    End of Session Equipment Utilized During Treatment: Oxygen Activity Tolerance: Patient tolerated treatment well;Patient limited by fatigue Patient left: in bed;with call bell/phone within reach Nurse Communication: Mobility status PT Visit Diagnosis:  Unsteadiness on feet (R26.81);Other abnormalities of gait and mobility (R26.89);Muscle weakness (generalized) (M62.81)    Time: 6122-4497 PT Time Calculation (min) (ACUTE ONLY): 25 min   Charges:   PT Evaluation $PT Eval Moderate Complexity: 1 Mod PT Treatments $Therapeutic Activity: 23-37 mins        4:05 PM, 03/19/19 Lonell Grandchild, MPT Physical Therapist with [facility] [phone] office [phone] mobile phone

## 2019-03-19 NOTE — Progress Notes (Signed)
Patient came to the floor around 6:50 pm from [facility] oriented x4, denies pain but sleepy pt. Will open her eyes to answer questions and go back to sleep, Judeth Porch RN reported patient is been sleeping off and on since she gave patient xanax this am Will continue to monitor patient.

## 2019-03-19 NOTE — Plan of Care (Signed)
  Problem: Acute Rehab PT Goals(only PT should resolve) Goal: Pt Will Go Supine/Side To Sit Outcome: Progressing Flowsheets (Taken 03/19/2019 1606) Pt will go Supine/Side to Sit: with modified independence Goal: Patient Will Transfer Sit To/From Stand Outcome: Progressing Flowsheets (Taken 03/19/2019 1606) Patient will transfer sit to/from stand: with min guard assist Goal: Pt Will Transfer Bed To Chair/Chair To Bed Outcome: Progressing Flowsheets (Taken 03/19/2019 1606) Pt will Transfer Bed to Chair/Chair to Bed: min guard assist Goal: Pt Will Ambulate Outcome: Progressing Flowsheets (Taken 03/19/2019 1606) Pt will Ambulate:  50 feet  with minimal assist  with min guard assist  with rolling walker   4:06 PM, 03/19/19 Lonell Grandchild, MPT Physical Therapist with [facility] [phone] office [phone] mobile phone

## 2019-03-19 NOTE — Progress Notes (Signed)
Patient transferring to [facility] unit 3E. Called [facility] to arrange transport.

## 2019-03-19 NOTE — Progress Notes (Signed)
  Echocardiogram 2D Echocardiogram has been performed.  Ellen Jordan 03/19/2019, 11:46 AM

## 2019-03-19 NOTE — Progress Notes (Signed)
PROGRESS NOTE    Ellen Jordan  SNK:[id number] DOB: September 17, 1945 DOA: 03/17/2019 PCP: [name], MD     Brief Narrative:  As per H&P written by Dr. Darrick Meigs on 03/17/19 74 y.o. female, with history of chronic pain atrial fibrillation, on chronic anticoagulation with Eliquis, diabetes mellitus type 2, anxiety, hypertension, pulmonary hypertension, came to hospital with worsening shortness of breath.  Patient says that shortness of breath got worse over the past few days.  She also complains of rectal bleeding tonight that occurs occasionally when she has hard bowel movement.  Denies chest pain, abdominal pain or dysuria.  She is followed by pulmonology for COPD and medication induced interstitial lung disease. Today in the ED she was found to have acute kidney injury with creatinine 2.99, her baseline creatinine was 1.4.  Chest x-ray shows mild pulmonary vascular congestion. She denies nausea and vomiting. Denies fever or chills. Patient was given 500 cc normal saline bolus and started on IV normal saline at 75 mL/h.   Assessment & Plan: 1-acute on chronic renal failure: At baseline a stage IIIb-stage IV; creatinine around 1.5-1.7.  On presentation creatinine up to 2.9 and currently has peak 2437 with decrease in her urine output. -Continue avoiding nephrotoxic agents, avoid hypotension -Nephrology service has been consulted -Patient advised to maintain adequate oral hydration. -at risk of requiring CVVH  2-dyspnea on exertion/shortness of breath: Multifactorial in the setting of interstitial lung disease, COPD, CHF, uncontrolled atrial fibrillation/arrhythmia. -Continue duo nebs, Breo Ellipta and oxygen supplementation -Unfortunately blood pressure making impossible for patient to be diuresis as a trial for an improvement in case there is a component of vascular congestion actively. -Formal consultation with pulmonologist at [facility] Will be appreciated for further recommendations for treatment on  interstitial lung disease and pulmonary hypertension.  3-acute on chronic diastolic heart failure -Unable to diurese currently due to poor blood pressure and worsening renal function -Per cardiology recommendation continue holding diuretics -BMP at 338 -Positive fine crackles or lower extremity edema appreciated on exam. -Continue to follow cardiology recommendations.  4-chronic atrial fibrillation -Continue Eliquis for secondary prevention -Flecainide and Cardizem has been discontinued per cardiology recommendations. -Continue beta-blocker.  5-type 2 diabetes mellitus -Continue sliding scale insulin and current long acting agents. -follow CBG's and adjust hypoglycemic regimen as required.  6-depression and anxiety -continue zoloft and PRN xanax -mood stable -no SI or hallucinations  7-[name] -continue statins  8-GERD -continue protonix   DVT prophylaxis: eliquis Code Status: DNR Family Communication: Husband at bedside. Disposition Plan: Remains inpatient.  Patient will be transferred to [facility] for further evaluation and management.  At this moment we will continue holding all her cardiac medications except for beta-blocker as recommended by cardiology service.  Continue to follow daily weights and strict I's and O's.  Blood pressure has remained soft and renal function has further deteriorates.  Patient at risk of requiring CVVH.  We will also require input from pulmonology service to further assess with management of her pulmonary HTN and interstitial lung disease.  Consultants:   Cardiology service  Nephrology service.  Procedures:   See below for x-ray reports  2D echo: Demonstrating moderately increased pulmonary artery pressure, severely dilated right ventricle with reduced contraction; significantly abnormal mitral valve and mild aortic stenosis.  Preserved ejection fraction 60 to 65%.  Antimicrobials:  Anti-infectives (From admission, onward)   None       Subjective: Generally weak, afebrile and reporting feeling short of breath.  2 L nasal cannula  supplementation in place with good oxygen saturation.  Patient reports decreasing her urine output.  Objective: Vitals:   03/18/19 2107 03/18/19 2245 03/19/19 0601 03/19/19 1034  BP: 93/66 (!) 87/57 (!) 98/58 90/61  Pulse: 94  72 92  Resp:   20   Temp: 97.6 F (36.4 C)  (!) 97.5 F (36.4 C)   TempSrc: Oral  Oral   SpO2: 100%  97%   Weight:      Height:        Intake/Output Summary (Last 24 hours) at 03/19/2019 1300 Last data filed at 03/19/2019 0900 Gross per 24 hour  Intake 240 ml  Output --  Net 240 ml   Filed Weights   03/17/19 2106  Weight: 79.4 kg    Examination: General exam: Alert, awake, oriented x 3; patient is afebrile, denies chest pain, no nausea, no vomiting.  She reported feeling short of breath and generally weak.  Patient reports decreasing her urine output. Respiratory system: decreased BS at the bases and fine crackles appreciated, no wheezing, positive diffuse rhonchi. Respiratory effort normal. Cardiovascular system: irregular, no rubs, no gallops, positive SEM Gastrointestinal system: Abdomen is nondistended, soft and nontender. No organomegaly or masses felt. Normal bowel sounds heard. Central nervous system: Alert and oriented. No focal neurological deficits. Extremities: No cyanosis or clubbing; lower extremities with 1+ edema bilaterally. Skin: No petechiae. Psychiatry: Judgement and insight appear normal. Mood & affect appropriate.     Data Reviewed: I have personally reviewed following labs and imaging studies  CBC: Recent Labs  Lab 03/17/19 2212 03/18/19 0505  WBC 10.8* 9.7  NEUTROABS 9.5*  --   HGB 9.4* 9.5*  HCT 30.3* 31.5*  MCV 97.4 99.1  PLT 176 789   Basic Metabolic Panel: Recent Labs  Lab 03/17/19 2212 03/18/19 0505 03/19/19 0951  NA 128* 131* 126*  K 4.3 5.0 4.4  CL 93* 96* 92*  CO2 22 19* 20*  GLUCOSE 203* 197* 218*   BUN 44* 46* 59*  CREATININE 2.99* 2.83* 4.37*  CALCIUM 8.6* 8.6* 8.4*   GFR: Estimated Creatinine Clearance: 11.1 mL/min (A) (by C-G formula based on SCr of 4.37 mg/dL (H)).   Liver Function Tests: Recent Labs  Lab 03/17/19 2212 03/18/19 0505  AST 28 30  ALT 20 18  ALKPHOS 63 62  BILITOT 0.6 0.6  PROT 7.2 7.1  ALBUMIN 3.8 3.7   BNP (last 3 results) Recent Labs    09/12/18 1201  PROBNP 51.0   HbA1C: Recent Labs    03/18/19 0505  HGBA1C 6.7*   CBG: Recent Labs  Lab 03/18/19 1114 03/18/19 1615 03/18/19 2106 03/19/19 0726 03/19/19 1118  GLUCAP 220* 229* 315* 157* 260*    Recent Results (from the past 240 hour(s))  SARS CORONAVIRUS 2 (TAT 6-24 HRS) Nasopharyngeal Nasopharyngeal Swab     Status: None   Collection Time: 03/18/19 12:10 AM   Specimen: Nasopharyngeal Swab  Result Value Ref Range Status   SARS Coronavirus 2 NEGATIVE NEGATIVE Final    Comment: (NOTE) SARS-CoV-2 target nucleic acids are NOT DETECTED. The SARS-CoV-2 RNA is generally detectable in upper and lower respiratory specimens during the acute phase of infection. Negative results do not preclude SARS-CoV-2 infection, do not rule out co-infections with other pathogens, and should not be used as the sole basis for treatment or other patient management decisions. Negative results must be combined with clinical observations, patient history, and epidemiological information. The expected result is Negative. Fact Sheet for Patients: SugarRoll.be Fact Sheet for  Healthcare Providers: https://www.woods-mathews.com/ This test is not yet approved or cleared by the [place] and  has been authorized for detection and/or diagnosis of SARS-CoV-2 by FDA under an Emergency Use Authorization (EUA). This EUA will remain  in effect (meaning this test can be used) for the duration of the COVID-19 declaration under Section 56 4(b)(1) of the Act, 21  U.S.C. section 360bbb-3(b)(1), unless the authorization is terminated or revoked sooner. Performed at [facility], [place] [address], [place], [place] [address]      Radiology Studies: DG Chest [place] 1 View  Result Date: 03/17/2019 CLINICAL DATA:  74 year old female with shortness of breath. EXAM: PORTABLE CHEST 1 VIEW COMPARISON:  Chest CT dated 03/01/2019. FINDINGS: There is background of chronic interstitial coarsening. Mild hazy airspace density primarily involving the mid to lower lung field likely combination of vascular congestion and areas of air trapping or small airway disease. Pneumonia is less likely but not excluded. Clinical correlation is recommended. No lobar consolidation, pleural effusion, or pneumothorax. There is mild cardiomegaly with mild vascular congestion. Atherosclerotic calcification of the aorta. No acute osseous pathology. IMPRESSION: 1. Mild cardiomegaly with mild vascular congestion. 2. Probable combination of air trapping and small airway disease. Pneumonia is less likely but not excluded. Electronically Signed   By: Anner Crete M.D.   On: 03/17/2019 22:09   ECHOCARDIOGRAM COMPLETE  Result Date: 03/19/2019    ECHOCARDIOGRAM REPORT   Patient Name:   LAVETTA GEIER Date of Exam: 03/19/2019 Medical Rec #:  [id number]     Height:       63.0 in Accession #:    [id number]    Weight:       175.0 lb Date of Birth:  02-Apr-1945    BSA:          1.827 m Patient Age:    21 years      BP:           90/61 mmHg Patient Gender: F             HR:           92 bpm. Exam Location:  [facility] Procedure: 2D Echo Indications:    Atrial Flutter 427.32 / I48.92  History:        Patient has prior history of Echocardiogram examinations, most                 recent 02/23/2018. Arrythmias:Atrial Fibrillation and RBBB; Risk                 Factors:Hypertension and Diabetes. Acute kidney injury.  Sonographer:    Vikki Ports Turrentine Referring Phys: [id number] [place]  1. Left  ventricular ejection fraction, by estimation, is 60 to 65%. The left ventricle has normal function. The left ventricle has no regional wall motion abnormalities. Left ventricular diastolic parameters are indeterminate. There is the interventricular septum is flattened in systole and diastole, consistent with right ventricular pressure and volume overload.  2. Right ventricular systolic function is mildly reduced. The right ventricular size is moderately to severely enlarged. There is moderately elevated pulmonary artery systolic pressure. The estimated right ventricular systolic pressure is 28.4 mmHg.  3. Left atrial size was mildly dilated.  4. Right atrial size was moderately dilated.  5. Mitral leaflets show significantly decreased excursion, although mean gradient suggests mild mitral stenosis.. The mitral valve is degenerative, severely calcified and associated with severe annular calcification. Trivial mitral valve regurgitation.  6. Tricuspid valve  regurgitation is moderate.  7. The aortic valve is tricuspid. Aortic valve regurgitation is not visualized. Mild aortic valve stenosis. Aortic valve mean gradient measures 5.7 mmHg. Aortic valve Vmax measures 1.44 m/s.  8. The inferior vena cava is dilated in size with <50% respiratory variability, suggesting right atrial pressure of 15 mmHg. FINDINGS  Left Ventricle: Left ventricular ejection fraction, by estimation, is 60 to 65%. The left ventricle has normal function. The left ventricle has no regional wall motion abnormalities. The left ventricular internal cavity size was normal in size. There is  no left ventricular hypertrophy. The interventricular septum is flattened in systole and diastole, consistent with right ventricular pressure and volume overload. Left ventricular diastolic parameters are indeterminate. Right Ventricle: The right ventricular size is severely enlarged. No increase in right ventricular wall thickness. Right ventricular systolic  function is mildly reduced. There is moderately elevated pulmonary artery systolic pressure. The tricuspid regurgitant velocity is 2.55 m/s, and with an assumed right atrial pressure of 15 mmHg, the estimated right ventricular systolic pressure is 94.7 mmHg. Left Atrium: Left atrial size was mildly dilated. Right Atrium: Right atrial size was moderately dilated. Pericardium: Trivial pericardial effusion is present. The pericardial effusion is posterior to the left ventricle. Mitral Valve: Mitral leaflets show significantly decreased excursion, although mean gradient suggests mild mitral stenosis. The mitral valve is degenerative in appearance. There is severe calcification of the mitral valve leaflet(s). Severe mitral annular calcification. Trivial mitral valve regurgitation. MV peak gradient, 15.3 mmHg. The mean mitral valve gradient is 4.5 mmHg. Tricuspid Valve: The tricuspid valve is grossly normal. Tricuspid valve regurgitation is moderate. Aortic Valve: The aortic valve is tricuspid. Aortic valve regurgitation is not visualized. Mild aortic stenosis is present. Mild to moderate aortic valve annular calcification. Aortic valve mean gradient measures 5.7 mmHg. Aortic valve peak gradient measures 8.3 mmHg. Aortic valve area, by VTI measures 1.10 cm. Pulmonic Valve: The pulmonic valve was grossly normal. Pulmonic valve regurgitation is trivial. Aorta: The aortic root is normal in size and structure. Venous: The inferior vena cava is dilated in size with less than 50% respiratory variability, suggesting right atrial pressure of 15 mmHg. IAS/Shunts: No atrial level shunt detected by color flow Doppler.  LEFT VENTRICLE PLAX 2D LVIDd:         3.75 cm LVIDs:         2.49 cm LV PW:         0.92 cm LV IVS:        0.95 cm LVOT diam:     1.60 cm LV SV:         33 LV SV Index:   18 LVOT Area:     2.01 cm  RIGHT VENTRICLE TAPSE (M-mode): 1.8 cm LEFT ATRIUM             Index       RIGHT ATRIUM           Index LA diam:         4.80 cm 2.63 cm/m  RA Area:     25.60 cm LA Vol (A2C):   98.6 ml 53.97 ml/m RA Volume:   90.00 ml  49.26 ml/m LA Vol (A4C):   66.7 ml 36.51 ml/m LA Biplane Vol: 82.1 ml 44.94 ml/m  AORTIC VALVE AV Area (Vmax):    1.05 cm AV Area (Vmean):   0.93 cm AV Area (VTI):     1.10 cm AV Vmax:           144.33  cm/s AV Vmean:          113.667 cm/s AV VTI:            0.297 m AV Peak Grad:      8.3 mmHg AV Mean Grad:      5.7 mmHg LVOT Vmax:         75.50 cm/s LVOT Vmean:        52.800 cm/s LVOT VTI:          0.163 m LVOT/AV VTI ratio: 0.55  AORTA Ao Root diam: 2.60 cm MITRAL VALVE                TRICUSPID VALVE MV Area (PHT): 2.87 cm     TR Peak grad:   26.0 mmHg MV Peak grad:  15.3 mmHg    TR Vmax:        255.00 cm/s MV Mean grad:  4.5 mmHg MV Vmax:       1.96 m/s     SHUNTS MV Vmean:      84.4 cm/s    Systemic VTI:  0.16 m MV Decel Time: 264 msec     Systemic Diam: 1.60 cm MV E velocity: 174.00 cm/s Rozann Lesches MD Electronically signed by Rozann Lesches MD Signature Date/Time: 03/19/2019/12:40:07 PM    Final     Scheduled Meds: . apixaban  5 mg Oral BID  . atenolol  50 mg Oral Daily  . atorvastatin  40 mg Oral Daily  . diltiazem  180 mg Oral Daily  . fluticasone furoate-vilanterol  1 puff Inhalation Daily  . insulin aspart  0-9 Units Subcutaneous TID WC  . insulin detemir  50 Units Subcutaneous QHS  . mouth rinse  15 mL Mouth Rinse BID  . pantoprazole  40 mg Oral Daily  . sertraline  100 mg Oral Daily  . sodium chloride flush  3 mL Intravenous Q12H   Continuous Infusions: . sodium chloride       LOS: 1 day    Time spent: 35 minutes. Greater than 50% of this time was spent in direct contact with the patient, coordinating care and discussing relevant ongoing clinical issues, including discussing the abnormal findings on her renal function test, also ongoing soft pressure with irregular HR and rhythm.  Patient continued to be short of breath at rest and on exertion and Also expressed decrease  in her urine output..  After discussing with cardiology service recommendations given to transfer patient to [facility] for further evaluation by cardiology, improvement of nephrology service and also formal consultation by pulmonologist.   Barton Dubois, MD [facility] Pager [phone]   03/19/2019, 1:00 PM

## 2019-03-19 NOTE — TOC Initial Note (Signed)
Transition of Care [facility]) - Initial/Assessment Note    Patient Details  Name: Ellen Jordan MRN: [id number] Date of Birth: 06-15-1945  Transition of Care [facility]) CM/SW Contact:    Boneta Lucks, RN Phone Number: 03/19/2019, 2:01 PM  Clinical Narrative:    Patient admitted with AKI. Patient has a high risk for readmission. Spoke with Linus Orn - daughter. Patient lives at home with her husband. He drives a bus and is out of the home morning and last afternoon.  Patient walks with a walker. Linus Orn is concerned, patient does not need to be alone and her father is having trouble caring for her. She has been having some confusion, doing tasks incorrectly. Linus Orn states their financial condition would qualify for Medicaid. Linus Orn will call social services and start the Medicaid application. She is requesting TOC work patient up for SNF. MD updated, will order PT.  Consult made to Chaplain for healthcare POA.  Meeting to be arranged with Hedy Camara- husband.              Expected Discharge Plan: Skilled Nursing Facility Barriers to Discharge: Continued Medical Work up   Patient Goals and CMS Choice Patient states their goals for this hospitalization and ongoing recovery are:: to go SNF per daughter. CMS Medicare.gov Compare Post Acute Care list provided to:: Patient Represenative (must comment) Choice offered to / list presented to : Adult Children  Expected Discharge Plan and Services Expected Discharge Plan: [place] Acute Care Choice: [place] Living arrangements for the past 2 months: Single Family Home                     Prior Living Arrangements/Services Living arrangements for the past 2 months: Single Family Home Lives with:: Spouse Patient language and need for interpreter reviewed:: No Do you feel safe going back to the place where you live?: Yes      Need for Family Participation in Patient Care: Yes (Comment) Care giver support system in  place?: Yes (comment) Current home services: DME Criminal Activity/Legal Involvement Pertinent to Current Situation/Hospitalization: No - Comment as needed  Activities of Daily Living Home Assistive Devices/Equipment: Shower chair with back ADL Screening (condition at time of admission) Patient's cognitive ability adequate to safely complete daily activities?: Yes Is the patient deaf or have difficulty hearing?: No Does the patient have difficulty seeing, even when wearing glasses/contacts?: No Does the patient have difficulty concentrating, remembering, or making decisions?: No Patient able to express need for assistance with ADLs?: Yes Does the patient have difficulty dressing or bathing?: Yes Independently performs ADLs?: Yes (appropriate for developmental age) Does the patient have difficulty walking or climbing stairs?: Yes Weakness of Legs: Both Weakness of Arms/Hands: None  Permission Sought/Granted Permission sought to share information with : Case Manager    Share Information with NAME: Linus Orn     Permission granted to share info w Relationship: Daughter     Emotional Assessment      Orientation: : Oriented to Self, Oriented to  Time, Oriented to Place Alcohol / Substance Use: Not Applicable Psych Involvement: No (comment)  Admission diagnosis:  SOB (shortness of breath) [R06.02] Chronic atrial fibrillation (HCC) [I48.20] AKI (acute kidney injury) ([place]) [N17.9] Patient Active Problem List   Diagnosis Date Noted  . AKI (acute kidney injury) ([place]) 03/18/2019  . Uncontrolled type 2 diabetes mellitus with hyperglycemia ([place]) 02/19/2019  . Atrial fibrillation with RVR ([place]) 12/18/2018  . Hypokalemia 12/18/2018  .  Normocytic anemia 12/18/2018  . Mediastinal lymphadenopathy 12/18/2018  . Pulmonary nodules 12/18/2018  . Pulmonary hypertension ([place]) 09/08/2018  . History of tobacco abuse 11/09/2016  . Controlled diabetes mellitus type 2 with complications ([place])  50/72/2575  . Restless leg syndrome 11/04/2016  . PAF (paroxysmal atrial fibrillation) ([place]) 06/03/2016  . RBBB 06/03/2016  . Atrial fibrillation with rapid ventricular response ([place]) 02/28/2015  . Uncontrolled type 2 diabetes mellitus without complication, with long-term current use of insulin 12/24/2014  . Mixed hyperlipidemia 12/24/2014  . Essential hypertension, benign 12/24/2014   PCP:  [name], MD Pharmacy:   [facility] [address] [place] [address] Phone: [phone] Fax: [facility] [place], [place] [address] Phone: [phone] Fax: [phone]  [facility] [place], [address] [place] [address] Phone: [phone] Fax: [phone]   Readmission Risk Interventions Readmission Risk Prevention Plan 03/19/2019  Transportation Screening Complete  PCP or Specialist Appt within 3-5 Days Not Complete  HRI or Home Care Consult Complete  Social Work Consult for [place] Planning/Counseling Complete  Palliative Care Screening Not Complete  Medication Review Press photographer) Complete  Some recent data might be hidden

## 2019-03-19 NOTE — Progress Notes (Signed)
Inpatient Diabetes Program Recommendations  AACE/ADA: New Consensus Statement on Inpatient Glycemic Control (2015)  Target Ranges:  Prepandial:   less than 140 mg/dL      Peak postprandial:   less than 180 mg/dL (1-2 hours)      Critically ill patients:  140 - 180 mg/dL   Lab Results  Component Value Date   GLUCAP 157 (H) 03/19/2019   HGBA1C 6.7 (H) 03/18/2019    Review of Glycemic Control Results for ANALEA, MULLER (MRN [id number]) as of 03/19/2019 09:41  Ref. Range 03/18/2019 07:23 03/18/2019 11:14 03/18/2019 16:15 03/18/2019 21:06 03/19/2019 07:26  Glucose-Capillary Latest Ref Range: 70 - 99 mg/dL 185 (H) 220 (H) 229 (H) 315 (H) 157 (H)   Diabetes history: DM 2 Outpatient Diabetes medications: Levemir 50 units qhs, Victoza 1.8 mg Daily, Novolog 10-16 units tid  Current orders for Inpatient glycemic control:  Levemir 50 units qhs Novolog 0-9 units tid  Inpatient Diabetes Program Recommendations:    Consider Novolog 3 units tid meal coverage if eating >50% of meals.  Thanks,  Tama Headings RN, MSN, BC-ADM Inpatient Diabetes Coordinator Team Pager [phone] (8a-5p)

## 2019-03-19 NOTE — Progress Notes (Signed)
   Progress Note  Patient Name: Ellen Jordan Date of Encounter: 03/19/2019  Brief follow-up note after morning rounds.  Heart rate remains adequately controlled, although medications initially held this morning given systolics in the 88C.  Plan to give beta-blocker, hold Cardizem CD till the afternoon depending on blood pressure response.  Diuretics have continued to be held.  Urine output has decreased.  BMP T shows significant worsening in renal function with creatinine up to 4.37, potassium 4.4.  Also hyponatremia with sodium down to 126.  Follow-up ECG shows probable atrial flutter with variable conduction and right bundle branch block and QRS widening as before.  Follow-up echocardiogram shows LVEF 60 to 65%.  Right ventricle is severely dilated with mildly reduced contraction in association with moderate pulmonary hypertension.  Mitral valve is significantly abnormal with severe calcification of leaflets and annulus, cusp excursion suggest severe stenosis however mean gradient suggest mild stenosis.  Aortic valve mildly stenotic.  Discussed case with Dr. Dyann Kief.  Transfer to [facility] recommended on the hospitalist team with consultation with nephrology, continued cardiology follow-up as well.  May need formal pulmonary consultation as well with COPD and ILD at baseline.  Mitral stenosis could be more of an issue than anticipated, but I do not think that she would tolerate a TEE at this particular time pending further stabilization.  Signed, Rozann Lesches, MD  03/19/2019, 12:46 PM

## 2019-03-19 NOTE — NC FL2 (Signed)
[place] LEVEL OF CARE SCREENING TOOL     IDENTIFICATION  Patient Name: Ellen Jordan Birthdate: [birth year] Sex: female Admission Date (Current Location): 03/17/2019  [facility] and [place] Number:  [employer] and Address:  [place] [address] [place]      Provider Number: [id number]  Attending Physician Name and Address:  Barton Dubois, MD  Relative Name and Phone Number:  Cresencia Asmus - [phone]    Current Level of Care: Hospital Recommended Level of Care: [place] Prior Approval Number:    Date Approved/Denied:   PASRR Number: [id number]  Discharge Plan: SNF    Current Diagnoses: Patient Active Problem List   Diagnosis Date Noted  . AKI (acute kidney injury) ([place]) 03/18/2019  . Uncontrolled type 2 diabetes mellitus with hyperglycemia ([place]) 02/19/2019  . Atrial fibrillation with RVR ([place]) 12/18/2018  . Hypokalemia 12/18/2018  . Normocytic anemia 12/18/2018  . Mediastinal lymphadenopathy 12/18/2018  . Pulmonary nodules 12/18/2018  . Pulmonary hypertension ([place]) 09/08/2018  . History of tobacco abuse 11/09/2016  . Controlled diabetes mellitus type 2 with complications ([place]) 03/50/0938  . Restless leg syndrome 11/04/2016  . PAF (paroxysmal atrial fibrillation) ([place]) 06/03/2016  . RBBB 06/03/2016  . Atrial fibrillation with rapid ventricular response ([place]) 02/28/2015  . Uncontrolled type 2 diabetes mellitus without complication, with long-term current use of insulin 12/24/2014  . Mixed hyperlipidemia 12/24/2014  . Essential hypertension, benign 12/24/2014    Orientation RESPIRATION BLADDER Height & Weight     Self, Time, Situation, Place  Normal External catheter Weight: 79.4 kg Height:  _0  (160 cm)  BEHAVIORAL SYMPTOMS/MOOD NEUROLOGICAL BOWEL NUTRITION STATUS      Continent Diet  AMBULATORY STATUS COMMUNICATION OF NEEDS Skin   Extensive Assist Verbally Bruising, Other  (Comment)(rash/ bruising bilateral arms and legs)                       Personal Care Assistance Level of Assistance  Bathing, Feeding, Dressing Bathing Assistance: Limited assistance Feeding assistance: Independent Dressing Assistance: Limited assistance     Functional Limitations Info  Sight, Hearing, Speech Sight Info: Adequate Hearing Info: Adequate Speech Info: Adequate    SPECIAL CARE FACTORS FREQUENCY  PT (By licensed PT)                    Contractures Contractures Info: Not present    Additional Factors Info  Code Status, Allergies Code Status Info: DNR Allergies Info: Penicillin           Current Medications (03/19/2019):  This is the current hospital active medication list Current Facility-Administered Medications  Medication Dose Route Frequency Provider Last Rate Last Admin  . 0.9 %  sodium chloride infusion  250 mL Intravenous PRN Oswald Hillock, MD      . acetaminophen (TYLENOL) tablet 650 mg  650 mg Oral Q6H PRN Lang Snow, FNP   650 mg at 03/18/19 2137   Or  . acetaminophen (TYLENOL) suppository 650 mg  650 mg Rectal Q6H PRN Lang Snow, FNP      . albuterol (PROVENTIL) (2.5 MG/3ML) 0.083% nebulizer solution 3 mL  3 mL Inhalation Q6H PRN Oswald Hillock, MD      . ALPRAZolam [name]) tablet 0.5 mg  0.5 mg Oral TID PRN Oswald Hillock, MD   0.5 mg at 03/19/19 1053  . apixaban (ELIQUIS) tablet 5 mg  5 mg Oral BID Oswald Hillock, MD  5 mg at 03/19/19 1046  . atenolol (TENORMIN) tablet 50 mg  50 mg Oral Daily Oswald Hillock, MD   50 mg at 03/19/19 1142  . atorvastatin (LIPITOR) tablet 40 mg  40 mg Oral Daily Oswald Hillock, MD   40 mg at 03/19/19 1036  . diltiazem (CARDIZEM CD) 24 hr capsule 180 mg  180 mg Oral Daily Oswald Hillock, MD   180 mg at 03/18/19 1037  . fluticasone furoate-vilanterol (BREO ELLIPTA) 100-25 MCG/INH 1 puff  1 puff Inhalation Daily Oswald Hillock, MD   1 puff at 03/19/19 0749  . insulin aspart (novoLOG) injection 0-9 Units  0-9  Units Subcutaneous TID WC Oswald Hillock, MD   5 Units at 03/19/19 1142  . insulin detemir (LEVEMIR) injection 50 Units  50 Units Subcutaneous QHS Oswald Hillock, MD   50 Units at 03/18/19 2131  . ipratropium-albuterol (DUONEB) 0.5-2.5 (3) MG/3ML nebulizer solution 3 mL  3 mL Nebulization Q6H PRN Oswald Hillock, MD      . MEDLINE mouth rinse  15 mL Mouth Rinse BID Oswald Hillock, MD   15 mL at 03/19/19 1047  . pantoprazole (PROTONIX) EC tablet 40 mg  40 mg Oral Daily Oswald Hillock, MD   40 mg at 03/19/19 1046  . sertraline (ZOLOFT) tablet 100 mg  100 mg Oral Daily Oswald Hillock, MD   100 mg at 03/19/19 1037  . sodium chloride flush (NS) 0.9 % injection 3 mL  3 mL Intravenous Q12H Oswald Hillock, MD   3 mL at 03/19/19 1143  . sodium chloride flush (NS) 0.9 % injection 3 mL  3 mL Intravenous PRN Darrick Meigs Marge Duncans, MD       Facility-Administered Medications Ordered in Other Encounters  Medication Dose Route Frequency Provider Last Rate Last Admin  . lactated ringers infusion   Intravenous Continuous PRN Kathryne Hitch, CRNA   New Bag at 12/18/18 1133     Discharge Medications: Please see discharge summary for a list of discharge medications.  Relevant Imaging Results:  Relevant Lab Results:   Additional Information SS# [id number]  Boneta Lucks, RN

## 2019-03-20 ENCOUNTER — Inpatient Hospital Stay: Payer: Self-pay

## 2019-03-20 ENCOUNTER — Inpatient Hospital Stay (HOSPITAL_COMMUNITY): Payer: Medicare HMO

## 2019-03-20 DIAGNOSIS — I50810 Right heart failure, unspecified: Secondary | ICD-10-CM

## 2019-03-20 DIAGNOSIS — J9621 Acute and chronic respiratory failure with hypoxia: Secondary | ICD-10-CM

## 2019-03-20 DIAGNOSIS — R9431 Abnormal electrocardiogram [ECG] [EKG]: Secondary | ICD-10-CM

## 2019-03-20 DIAGNOSIS — N179 Acute kidney failure, unspecified: Secondary | ICD-10-CM

## 2019-03-20 DIAGNOSIS — N189 Chronic kidney disease, unspecified: Secondary | ICD-10-CM

## 2019-03-20 DIAGNOSIS — R5381 Other malaise: Secondary | ICD-10-CM

## 2019-03-20 DIAGNOSIS — E871 Hypo-osmolality and hyponatremia: Secondary | ICD-10-CM

## 2019-03-20 DIAGNOSIS — I4510 Unspecified right bundle-branch block: Secondary | ICD-10-CM

## 2019-03-20 DIAGNOSIS — J439 Emphysema, unspecified: Secondary | ICD-10-CM

## 2019-03-20 DIAGNOSIS — Z794 Long term (current) use of insulin: Secondary | ICD-10-CM

## 2019-03-20 DIAGNOSIS — I50811 Acute right heart failure: Secondary | ICD-10-CM

## 2019-03-20 DIAGNOSIS — Z9189 Other specified personal risk factors, not elsewhere classified: Secondary | ICD-10-CM

## 2019-03-20 DIAGNOSIS — J9601 Acute respiratory failure with hypoxia: Secondary | ICD-10-CM

## 2019-03-20 DIAGNOSIS — J9611 Chronic respiratory failure with hypoxia: Secondary | ICD-10-CM

## 2019-03-20 DIAGNOSIS — I4821 Permanent atrial fibrillation: Secondary | ICD-10-CM

## 2019-03-20 DIAGNOSIS — R7989 Other specified abnormal findings of blood chemistry: Secondary | ICD-10-CM

## 2019-03-20 DIAGNOSIS — J449 Chronic obstructive pulmonary disease, unspecified: Secondary | ICD-10-CM

## 2019-03-20 DIAGNOSIS — E1165 Type 2 diabetes mellitus with hyperglycemia: Secondary | ICD-10-CM

## 2019-03-20 DIAGNOSIS — E872 Acidosis: Secondary | ICD-10-CM

## 2019-03-20 LAB — BASIC METABOLIC PANEL
Anion gap: 16 — ABNORMAL HIGH (ref 5–15)
BUN: 61 mg/dL — ABNORMAL HIGH (ref 8–23)
CO2: 18 mmol/L — ABNORMAL LOW (ref 22–32)
Calcium: 8.7 mg/dL — ABNORMAL LOW (ref 8.9–10.3)
Chloride: 93 mmol/L — ABNORMAL LOW (ref 98–111)
Creatinine, Ser: 4.27 mg/dL — ABNORMAL HIGH (ref 0.44–1.00)
GFR calc Af Amer: 11 mL/min — ABNORMAL LOW (ref >60)
GFR calc non Af Amer: 10 mL/min — ABNORMAL LOW (ref >60)
Glucose, Bld: 141 mg/dL — ABNORMAL HIGH (ref 70–99)
Potassium: 5.1 mmol/L (ref 3.5–5.1)
Sodium: 127 mmol/L — ABNORMAL LOW (ref 135–145)

## 2019-03-20 LAB — TYPE AND SCREEN
ABO/RH(D): O POS
Antibody Screen: NEGATIVE

## 2019-03-20 LAB — CBC
HCT: 28.8 % — ABNORMAL LOW (ref 36.0–46.0)
Hemoglobin: 9.5 g/dL — ABNORMAL LOW (ref 12.0–15.0)
MCH: 30.7 pg (ref 26.0–34.0)
MCHC: 33 g/dL (ref 30.0–36.0)
MCV: 93.2 fL (ref 80.0–100.0)
Platelets: 168 10*3/uL (ref 150–400)
RBC: 3.09 MIL/uL — ABNORMAL LOW (ref 3.87–5.11)
RDW: 15.1 % (ref 11.5–15.5)
WBC: 8.4 10*3/uL (ref 4.0–10.5)
nRBC: 0 % (ref 0.0–0.2)

## 2019-03-20 LAB — GLUCOSE, CAPILLARY
Glucose-Capillary: 156 mg/dL — ABNORMAL HIGH (ref 70–99)
Glucose-Capillary: 195 mg/dL — ABNORMAL HIGH (ref 70–99)
Glucose-Capillary: 232 mg/dL — ABNORMAL HIGH (ref 70–99)

## 2019-03-20 LAB — VITAMIN B12: Vitamin B-12: 1812 pg/mL — ABNORMAL HIGH (ref 180–914)

## 2019-03-20 LAB — FERRITIN: Ferritin: 45 ng/mL (ref 11–307)

## 2019-03-20 LAB — URINALYSIS, ROUTINE W REFLEX MICROSCOPIC
Bacteria, UA: NONE SEEN
Bilirubin Urine: NEGATIVE
Glucose, UA: NEGATIVE mg/dL
Hgb urine dipstick: NEGATIVE
Ketones, ur: NEGATIVE mg/dL
Nitrite: NEGATIVE
Protein, ur: 30 mg/dL — AB
Specific Gravity, Urine: 1.01 (ref 1.005–1.030)
pH: 5 (ref 5.0–8.0)

## 2019-03-20 LAB — IRON AND TIBC
Iron: 71 ug/dL (ref 28–170)
Saturation Ratios: 14 % (ref 10.4–31.8)
TIBC: 522 ug/dL — ABNORMAL HIGH (ref 250–450)
UIBC: 451 ug/dL

## 2019-03-20 LAB — COOXEMETRY PANEL
Carboxyhemoglobin: 1.1 % (ref 0.5–1.5)
Methemoglobin: 1.1 % (ref 0.0–1.5)
O2 Saturation: 35.4 %
Total hemoglobin: 10.2 g/dL — ABNORMAL LOW (ref 12.0–16.0)

## 2019-03-20 LAB — ABO/RH: ABO/RH(D): O POS

## 2019-03-20 LAB — FOLATE: Folate: 32.3 ng/mL (ref >5.9)

## 2019-03-20 LAB — RETICULOCYTES
Immature Retic Fract: 23.2 % — ABNORMAL HIGH (ref 2.3–15.9)
RBC.: 3.1 MIL/uL — ABNORMAL LOW (ref 3.87–5.11)
Retic Count, Absolute: 78.7 10*3/uL (ref 19.0–186.0)
Retic Ct Pct: 2.5 % (ref 0.4–3.1)

## 2019-03-20 LAB — CREATININE, URINE, RANDOM: Creatinine, Urine: 79.65 mg/dL

## 2019-03-20 LAB — OSMOLALITY, URINE: Osmolality, Ur: 271 mOsm/kg — ABNORMAL LOW (ref 300–900)

## 2019-03-20 LAB — SODIUM, URINE, RANDOM: Sodium, Ur: 32 mmol/L

## 2019-03-20 MED ORDER — FUROSEMIDE 10 MG/ML IJ SOLN
20.0000 mg | Freq: Two times a day (BID) | INTRAMUSCULAR | Status: DC
  Administered 2019-03-20: 20 mg via INTRAVENOUS
  Filled 2019-03-20: qty 2

## 2019-03-20 MED ORDER — DARBEPOETIN ALFA 60 MCG/0.3ML IJ SOSY
60.0000 ug | PREFILLED_SYRINGE | INTRAMUSCULAR | Status: DC
  Administered 2019-03-20 – 2019-03-27 (×2): 60 ug via SUBCUTANEOUS
  Filled 2019-03-20 (×3): qty 0.3

## 2019-03-20 MED ORDER — AMIODARONE HCL IN DEXTROSE 360-4.14 MG/200ML-% IV SOLN
30.0000 mg/h | INTRAVENOUS | Status: DC
  Administered 2019-03-20 – 2019-03-28 (×13): 30 mg/h via INTRAVENOUS
  Filled 2019-03-20 (×12): qty 200

## 2019-03-20 MED ORDER — SODIUM CHLORIDE 0.9% FLUSH
10.0000 mL | INTRAVENOUS | Status: DC | PRN
  Administered 2019-03-27 – 2019-03-28 (×2): 10 mL

## 2019-03-20 MED ORDER — METOPROLOL SUCCINATE ER 25 MG PO TB24: 25.0000 mg | ORAL_TABLET | Freq: Every day | ORAL | Status: DC

## 2019-03-20 MED ORDER — FUROSEMIDE 10 MG/ML IJ SOLN
120.0000 mg | Freq: Two times a day (BID) | INTRAVENOUS | Status: DC
  Administered 2019-03-20: 120 mg via INTRAVENOUS
  Filled 2019-03-20 (×2): qty 12
  Filled 2019-03-20: qty 2

## 2019-03-20 MED ORDER — CHLORHEXIDINE GLUCONATE CLOTH 2 % EX PADS
6.0000 | MEDICATED_PAD | Freq: Every day | CUTANEOUS | Status: DC
  Administered 2019-03-21 – 2019-03-28 (×9): 6 via TOPICAL

## 2019-03-20 MED ORDER — SODIUM CHLORIDE 0.9% FLUSH
10.0000 mL | Freq: Two times a day (BID) | INTRAVENOUS | Status: DC
  Administered 2019-03-20 – 2019-03-28 (×13): 10 mL

## 2019-03-20 MED ORDER — DOBUTAMINE IN D5W 4-5 MG/ML-% IV SOLN
5.0000 ug/kg/min | INTRAVENOUS | Status: DC
  Administered 2019-03-20: 3 ug/kg/min via INTRAVENOUS
  Administered 2019-03-22 – 2019-03-23 (×2): 7.5 ug/kg/min via INTRAVENOUS
  Filled 2019-03-20 (×6): qty 250

## 2019-03-20 MED ORDER — AMIODARONE IV BOLUS ONLY 150 MG/100ML
150.0000 mg | Freq: Once | INTRAVENOUS | Status: AC
  Administered 2019-03-20: 150 mg via INTRAVENOUS
  Filled 2019-03-20: qty 100

## 2019-03-20 NOTE — Progress Notes (Signed)
Chaplain responded to consult for Advanced Directive.  Chaplain took paperwork to Ellen Jordan's room but she was not in there.  Chaplain left paperwork and followed-up with Mrs. Tison husband who will be arriving soon.  Chaplain will provide education around Advanced Directive.  Please page chaplain as needed.

## 2019-03-20 NOTE — Progress Notes (Signed)
Arrived to room to assess for second site, pt is having legs wrapped. Informed RN that PICC team will assess to see if they can place PICC tonight. If not, consult RN will be back to start PIV.--TW

## 2019-03-20 NOTE — Progress Notes (Signed)
Physical Therapy Treatment Patient Details Name: Ellen Jordan MRN: [id number] DOB: 1945/06/15 Today's Date: 03/20/2019    History of Present Illness Ellen Jordan  is a 74 y.o. female, with history of chronic pain atrial fibrillation, on chronic anticoagulation with Eliquis, diabetes mellitus type 2, anxiety, hypertension, pulmonary hypertension, came to hospital with worsening shortness of breath.  Patient says that shortness of breath got worse over the past few days.  She also complains of rectal bleeding tonight that occurs occasionally when she has hard bowel movement.  Denies chest pain, abdominal pain or dysuria.  She is followed by pulmonology for COPD and medication induced interstitial lung disease. Pt transferred to [facility] on 3/9.    PT Comments    Pt limited by SOB during session despite sats at 100% on 2L [place]. Pt continues to require physical assistance to perform bed mobility and transfers at this time due to generalized weakness. Pt remains at a high falls risk due to weakness, as well as gait and balance impairments. Pt will benefit from aggressive mobilization to continue improving activity tolerance and to reduce falls risk. PT continues to recommend SNF at this time.   Follow Up Recommendations  SNF     Equipment Recommendations  (defer to post-acute setting)    Recommendations for Other Services       Precautions / Restrictions Precautions Precautions: Fall Restrictions Weight Bearing Restrictions: No    Mobility  Bed Mobility Overal bed mobility: Needs Assistance Bed Mobility: Supine to Sit     Supine to sit: Min assist        Transfers Overall transfer level: Needs assistance Equipment used: Rolling walker (2 wheeled) Transfers: Sit to/from Stand Sit to Stand: Min assist            Ambulation/Gait Ambulation/Gait assistance: Min guard Gait Distance (Feet): 40 Feet Assistive device: Rolling walker (2 wheeled) Gait Pattern/deviations: Decreased  step length - right;Decreased step length - left;Wide base of support Gait velocity: reduced Gait velocity interpretation: <1.8 ft/sec, indicate of risk for recurrent falls General Gait Details: pt with slowed step to gait, increased sway during turns   Chief Strategy Officer    Modified Rankin (Stroke Patients Only)       Balance Overall balance assessment: Needs assistance Sitting-balance support: Single extremity supported;Feet supported Sitting balance-Leahy Scale: Good Sitting balance - Comments: supervision   Standing balance support: Bilateral upper extremity supported Standing balance-Leahy Scale: Fair Standing balance comment: minG with BUE support of RW                            Cognition Arousal/Alertness: Awake/alert Behavior During Therapy: WFL for tasks assessed/performed Overall Cognitive Status: Within Functional Limits for tasks assessed                                        Exercises      General Comments General comments (skin integrity, edema, etc.): VSS on 2L [place], pt saturating at 100% despite reports of SOB, HR tachy into low 110s.      Pertinent Vitals/Pain Pain Assessment: Faces Faces Pain Scale: Hurts little more Pain Location: chronic low back pain Pain Descriptors / Indicators: Aching Pain Intervention(s): Monitored during session    Home Living  Prior Function            PT Goals (current goals can now be found in the care plan section) Acute Rehab PT Goals Patient Stated Goal: return home after rehab Progress towards PT goals: Progressing toward goals    Frequency    Min 3X/week      PT Plan Current plan remains appropriate    Co-evaluation              AM-PAC PT "6 Clicks" Mobility   Outcome Measure  Help needed turning from your back to your side while in a flat bed without using bedrails?: None Help needed moving from lying on  your back to sitting on the side of a flat bed without using bedrails?: A Little Help needed moving to and from a bed to a chair (including a wheelchair)?: A Little Help needed standing up from a chair using your arms (e.g., wheelchair or bedside chair)?: A Little Help needed to walk in hospital room?: A Little Help needed climbing 3-5 steps with a railing? : A Lot 6 Click Score: 18    End of Session Equipment Utilized During Treatment: Oxygen Activity Tolerance: Patient limited by fatigue Patient left: in chair;with call bell/phone within reach Nurse Communication: Mobility status PT Visit Diagnosis: Unsteadiness on feet (R26.81);Other abnormalities of gait and mobility (R26.89);Muscle weakness (generalized) (M62.81)     Time: 9371-6967 PT Time Calculation (min) (ACUTE ONLY): 23 min  Charges:  $Gait Training: 8-22 mins $Therapeutic Activity: 8-22 mins                     Zenaida Niece, PT, DPT Acute Rehabilitation Pager: [phone]    Zenaida Niece 03/20/2019, 10:03 AM

## 2019-03-20 NOTE — Progress Notes (Signed)
Progress Note  Patient Name: Ellen Jordan Date of Encounter: 03/20/2019  Primary Cardiologist: Rozann Lesches, MD   Subjective   She feels tired.  Breathing is a little bit better currently.  Early this morning it was rough.  No chest pain.  Inpatient Medications    Scheduled Meds: . apixaban  5 mg Oral BID  . atenolol  50 mg Oral Daily  . atorvastatin  40 mg Oral Daily  . fluticasone furoate-vilanterol  1 puff Inhalation Daily  . insulin aspart  0-9 Units Subcutaneous TID WC  . insulin detemir  50 Units Subcutaneous QHS  . mouth rinse  15 mL Mouth Rinse BID  . pantoprazole  40 mg Oral Daily  . sertraline  100 mg Oral Daily  . sodium chloride flush  3 mL Intravenous Q12H   Continuous Infusions: . sodium chloride     PRN Meds: sodium chloride, acetaminophen **OR** acetaminophen, albuterol, ALPRAZolam, ipratropium-albuterol, sodium chloride flush   Vital Signs    Vitals:   03/19/19 2033 03/20/19 0100 03/20/19 0431 03/20/19 0853  BP: 110/62 116/73 (!) 124/99 108/69  Pulse: 74 (!) 109 97 (!) 112  Resp: _0 Temp: 97.8 F (36.6 C)  97.6 F (36.4 C) 97.6 F (36.4 C)  TempSrc:    Oral  SpO2: 94% 100% 94% 96%  Weight:   93.3 kg   Height:        Intake/Output Summary (Last 24 hours) at 03/20/2019 1031 Last data filed at 03/20/2019 0436 Gross per 24 hour  Intake 600 ml  Output --  Net 600 ml   Last 3 Weights 03/20/2019 03/19/2019 03/17/2019  Weight (lbs) 205 lb 11 oz 207 lb 0.2 oz 175 lb  Weight (kg) 93.3 kg 93.9 kg 79.379 kg      Telemetry    Tachycardic, slightly irregular atrial fibrillation- Personally Reviewed  ECG    Widened QRS, underlying atrial fibrillation- Personally Reviewed  Physical Exam   GEN: No acute distress.  Sitting in chair Neck: No JVD Cardiac:  Tachycardic slightly irregular, no murmurs, rubs, or gallops.  Respiratory:  Crackles heard at bases bilaterally. GI: Soft, nontender, non-distended  MS: No edema; No  deformity. Neuro:  Nonfocal  Psych: Normal affect   Labs    High Sensitivity Troponin:   Recent Labs  Lab 03/17/19 2213 03/18/19 0115 03/18/19 0702 03/18/19 0847  TROPONINIHS _1 Chemistry Recent Labs  Lab 03/17/19 2212 03/17/19 2212 03/18/19 0505 03/19/19 0951 03/20/19 0410  NA 128*   < > 131* 126* 127*  K 4.3   < > 5.0 4.4 5.1  CL 93*   < > 96* 92* 93*  CO2 22   < > 19* 20* 18*  GLUCOSE 203*   < > 197* 218* 141*  BUN 44*   < > 46* 59* 61*  CREATININE 2.99*   < > 2.83* 4.37* 4.27*  CALCIUM 8.6*   < > 8.6* 8.4* 8.7*  PROT 7.2  --  7.1  --   --   ALBUMIN 3.8  --  3.7  --   --   AST 28  --  30  --   --   ALT 20  --  18  --   --   ALKPHOS 63  --  62  --   --   BILITOT 0.6  --  0.6  --   --   GFRNONAA 15*   < >  16* 9* 10*  GFRAA 17*   < > 18* 11* 11*  ANIONGAP 13   < > 16* 14 16*   < > = values in this interval not displayed.     Hematology Recent Labs  Lab 03/17/19 2212 03/18/19 0505  WBC 10.8* 9.7  RBC 3.11* 3.18*  HGB 9.4* 9.5*  HCT 30.3* 31.5*  MCV 97.4 99.1  MCH 30.2 29.9  MCHC 31.0 30.2  RDW 15.1 15.1  PLT 176 192    BNP Recent Labs  Lab 03/17/19 2213  BNP 338.0*     DDimer No results for input(s): DDIMER in the last 168 hours.   Radiology    US RENAL  Result Date: 03/20/2019 CLINICAL DATA:  Acute kidney injury EXAM: RENAL / URINARY TRACT ULTRASOUND COMPLETE COMPARISON:  None. FINDINGS: Right Kidney: Renal measurements: 12.5 x 4.9 x 6.5 cm = volume: 209 mL. Cortical thinning. Normal echotexture. No mass or hydronephrosis. Left Kidney: Renal measurements: 12.0 x 5.6 x 6.5 cm = volume: 228 mL. Cortical thinning. Normal echotexture. No mass or hydronephrosis. Bladder: Not visualized, decompressed Other: None. IMPRESSION: Cortical thinning bilaterally. No acute findings. No hydronephrosis. Electronically Signed   By: Rolm Baptise M.D.   On: 03/20/2019 10:12   ECHOCARDIOGRAM COMPLETE  Result Date: 03/19/2019    ECHOCARDIOGRAM REPORT    Patient Name:   Ellen Jordan Date of Exam: 03/19/2019 Medical Rec #:  [id number]     Height:       63.0 in Accession #:    [id number]    Weight:       175.0 lb Date of Birth:  09-Sep-1945    BSA:          1.827 m Patient Age:    74 years      BP:           90/61 mmHg Patient Gender: F             HR:           92 bpm. Exam Location:  [facility] Procedure: 2D Echo Indications:    Atrial Flutter 427.32 / I48.92  History:        Patient has prior history of Echocardiogram examinations, most                 recent 02/23/2018. Arrythmias:Atrial Fibrillation and RBBB; Risk                 Factors:Hypertension and Diabetes. Acute kidney injury.  Sonographer:    Vikki Ports Turrentine Referring Phys: [id number] [place]  1. Left ventricular ejection fraction, by estimation, is 60 to 65%. The left ventricle has normal function. The left ventricle has no regional wall motion abnormalities. Left ventricular diastolic parameters are indeterminate. There is the interventricular septum is flattened in systole and diastole, consistent with right ventricular pressure and volume overload.  2. Right ventricular systolic function is mildly reduced. The right ventricular size is moderately to severely enlarged. There is moderately elevated pulmonary artery systolic pressure. The estimated right ventricular systolic pressure is 08.1 mmHg.  3. Left atrial size was mildly dilated.  4. Right atrial size was moderately dilated.  5. Mitral leaflets show significantly decreased excursion, although mean gradient suggests mild mitral stenosis.. The mitral valve is degenerative, severely calcified and associated with severe annular calcification. Trivial mitral valve regurgitation.  6. Tricuspid valve regurgitation is moderate.  7. The aortic valve is tricuspid. Aortic valve regurgitation is not visualized. Mild aortic valve  stenosis. Aortic valve mean gradient measures 5.7 mmHg. Aortic valve Vmax measures 1.44 m/s.  8. The inferior  vena cava is dilated in size with <50% respiratory variability, suggesting right atrial pressure of 15 mmHg. FINDINGS  Left Ventricle: Left ventricular ejection fraction, by estimation, is 60 to 65%. The left ventricle has normal function. The left ventricle has no regional wall motion abnormalities. The left ventricular internal cavity size was normal in size. There is  no left ventricular hypertrophy. The interventricular septum is flattened in systole and diastole, consistent with right ventricular pressure and volume overload. Left ventricular diastolic parameters are indeterminate. Right Ventricle: The right ventricular size is severely enlarged. No increase in right ventricular wall thickness. Right ventricular systolic function is mildly reduced. There is moderately elevated pulmonary artery systolic pressure. The tricuspid regurgitant velocity is 2.55 m/s, and with an assumed right atrial pressure of 15 mmHg, the estimated right ventricular systolic pressure is 15.7 mmHg. Left Atrium: Left atrial size was mildly dilated. Right Atrium: Right atrial size was moderately dilated. Pericardium: Trivial pericardial effusion is present. The pericardial effusion is posterior to the left ventricle. Mitral Valve: Mitral leaflets show significantly decreased excursion, although mean gradient suggests mild mitral stenosis. The mitral valve is degenerative in appearance. There is severe calcification of the mitral valve leaflet(s). Severe mitral annular calcification. Trivial mitral valve regurgitation. MV peak gradient, 15.3 mmHg. The mean mitral valve gradient is 4.5 mmHg. Tricuspid Valve: The tricuspid valve is grossly normal. Tricuspid valve regurgitation is moderate. Aortic Valve: The aortic valve is tricuspid. Aortic valve regurgitation is not visualized. Mild aortic stenosis is present. Mild to moderate aortic valve annular calcification. Aortic valve mean gradient measures 5.7 mmHg. Aortic valve peak gradient  measures 8.3 mmHg. Aortic valve area, by VTI measures 1.10 cm. Pulmonic Valve: The pulmonic valve was grossly normal. Pulmonic valve regurgitation is trivial. Aorta: The aortic root is normal in size and structure. Venous: The inferior vena cava is dilated in size with less than 50% respiratory variability, suggesting right atrial pressure of 15 mmHg. IAS/Shunts: No atrial level shunt detected by color flow Doppler.  LEFT VENTRICLE PLAX 2D LVIDd:         3.75 cm LVIDs:         2.49 cm LV PW:         0.92 cm LV IVS:        0.95 cm LVOT diam:     1.60 cm LV SV:         33 LV SV Index:   18 LVOT Area:     2.01 cm  RIGHT VENTRICLE TAPSE (M-mode): 1.8 cm LEFT ATRIUM             Index       RIGHT ATRIUM           Index LA diam:        4.80 cm 2.63 cm/m  RA Area:     25.60 cm LA Vol (A2C):   98.6 ml 53.97 ml/m RA Volume:   90.00 ml  49.26 ml/m LA Vol (A4C):   66.7 ml 36.51 ml/m LA Biplane Vol: 82.1 ml 44.94 ml/m  AORTIC VALVE AV Area (Vmax):    1.05 cm AV Area (Vmean):   0.93 cm AV Area (VTI):     1.10 cm AV Vmax:           144.33 cm/s AV Vmean:          113.667 cm/s AV VTI:  0.297 m AV Peak Grad:      8.3 mmHg AV Mean Grad:      5.7 mmHg LVOT Vmax:         75.50 cm/s LVOT Vmean:        52.800 cm/s LVOT VTI:          0.163 m LVOT/AV VTI ratio: 0.55  AORTA Ao Root diam: 2.60 cm MITRAL VALVE                TRICUSPID VALVE MV Area (PHT): 2.87 cm     TR Peak grad:   26.0 mmHg MV Peak grad:  15.3 mmHg    TR Vmax:        255.00 cm/s MV Mean grad:  4.5 mmHg MV Vmax:       1.96 m/s     SHUNTS MV Vmean:      84.4 cm/s    Systemic VTI:  0.16 m MV Decel Time: 264 msec     Systemic Diam: 1.60 cm MV E velocity: 174.00 cm/s Rozann Lesches MD Electronically signed by Rozann Lesches MD Signature Date/Time: 03/19/2019/12:40:07 PM    Final     Cardiac Studies   Echo-EF 60 to 65%.  Right ventricle severely dilated mildly reduced contraction.  Moderate pulmonary hypertension.  Calcification on mitral valve.  Cusp  excursion suggest severe stenosis however mean gradient suggest mild stenosis.  Mild aortic stenosis as well.  Patient Profile     74 y.o. female with COPD, interstitial lung disease, atrial fibrillation, chronic anticoagulation Eliquis, type 2 diabetes, anxiety, here with worsening shortness of breath  Assessment & Plan    Acute kidney injury -Creatinine now 4.27 up from 2.83.  Nephrology consultation.  Acute diastolic heart failure -Fine crackles on exam, has history of interstitial lung disease as well, worsening renal function, AKI challenging to diurese in this setting. -Nephrology consult.  Chronic atrial fibrillation, permanent -Eliquis, flecainide and Cardizem both have been discontinued given the chronic nature of her atrial fibrillation. -Continue with beta-blocker.  Mildly tachycardic this morning. -Right bundle branch block present.  Very wide QRS.  Mitral stenosis -Gradients are mild to moderate however visually appears more significant.  Dr. Domenic Polite does not think at this time that she would tolerate TEE for further clarification.  I agree.  Dyspnea on exertion -Multifactorial interstitial lung disease COPD diastolic heart failure atrial fibrillation. -Given hypotension, unable to fully diurese to see if vascular congestion is playing a role.  High complexity medical situations.  For questions or updates, please contact [place] Please consult [email or web address] for contact info under        Signed, [name], MD  03/20/2019, 10:31 AM

## 2019-03-20 NOTE — Consult Note (Signed)
[facility] Renal Consultation Note  Requesting MD: Cyndia Skeeters Indication for Consultation: A on CRF  HPI:  Ellen Jordan is a 74 y.o. female with past medical history significant for COPD on home oxygen, pulmonary hypertension and diastolic heart failure, type 2 diabetes mellitus, hypertension as well as chronic pain.  Stated CKD, creatinine less than 1 until 2019.  In 2019 creatinine 1.0-1.1.  Creatinine in December 2020 1.07.  Then, in early February 2021 creatinine 1.4-1.5.  She presented to [facility] on 3/8 with complaints of worsening shortness of breath-according to husband, seem to be improving for quite some time.  Creatinine on presentation there was 2.99.  Her chest x-ray showed some vascular congestion but she was hydrated-her ACE inhibitor was put on hold as were her diuretics.  Hospitalization was complicated by atrial fibrillation with a rapid rate and low blood pressure in the 80s.  Creatinine on 3/8 was 2.8 but then on 3/9 creatinine was over 4 with decreased urine output.  At that time decision was made to transfer to [facility] for further management.  Some of her blood pressure lowering medications were held.  Here, it appears blood pressure is soft but over 100.  No urine output is recorded but she says she is making some,  creatinine is fairly stable at 4.2 today.  There is not urinalysis for review.  Renal ultrasound reveals normal-sized kidneys but with cortical thinning.  I saw her just after she had ambulated 4 to 6 feet from the bedside chair to the sink.  She looks exhausted-took her time to recover  Creatinine  Date/Time Value Ref Range Status  07/23/2018 12:00 AM 1.3 (A) 0.5 - 1.1 Final   Creat  Date/Time Value Ref Range Status  01/04/2018 09:19 AM 1.03 (H) 0.60 - 0.93 mg/dL Final    Comment:    For patients >57 years of age, the reference limit for Creatinine is approximately 13% higher for people identified as African-American. Marland Kitchen   06/16/2017  09:59 AM 1.09 (H) 0.60 - 0.93 mg/dL Final    Comment:    For patients >52 years of age, the reference limit for Creatinine is approximately 13% higher for people identified as African-American. Marland Kitchen   03/10/2017 11:15 AM 1.01 (H) 0.60 - 0.93 mg/dL Final    Comment:    For patients >67 years of age, the reference limit for Creatinine is approximately 13% higher for people identified as African-American. .   10/13/2016 09:03 AM 0.87 0.60 - 0.93 mg/dL Final    Comment:    For patients >56 years of age, the reference limit for Creatinine is approximately 13% higher for people identified as African-American. Marland Kitchen   07/12/2016 09:05 AM 0.83 0.60 - 0.93 mg/dL Final    Comment:      For patients > or = 76 years of age: The upper reference limit for Creatinine is approximately 13% higher for people identified as African-American.     03/29/2016 08:48 AM 0.84 0.60 - 0.93 mg/dL Final    Comment:      For patients > or = 76 years of age: The upper reference limit for Creatinine is approximately 13% higher for people identified as African-American.     12/30/2015 09:20 AM 0.89 0.60 - 0.93 mg/dL Final    Comment:      For patients > or = 76 years of age: The upper reference limit for Creatinine is approximately 13% higher for people identified as African-American.  09/18/2015 09:18 AM 0.93 0.60 - 0.93 mg/dL Final    Comment:      For patients > or = 76 years of age: The upper reference limit for Creatinine is approximately 13% higher for people identified as African-American.     06/23/2015 10:06 AM 0.90 0.60 - 0.93 mg/dL Final    Comment:      For patients > or = 76 years of age: The upper reference limit for Creatinine is approximately 13% higher for people identified as African-American.     03/17/2015 09:17 AM 1.22 (H) 0.60 - 0.93 mg/dL Final   Creatinine, Ser  Date/Time Value Ref Range Status  03/20/2019 04:10 AM 4.27 (H) 0.44 - 1.00 mg/dL Final  03/19/2019 09:51  AM 4.37 (H) 0.44 - 1.00 mg/dL Final    Comment:    DELTA CHECK NOTED  03/18/2019 05:05 AM 2.83 (H) 0.44 - 1.00 mg/dL Final  03/17/2019 10:12 PM 2.99 (H) 0.44 - 1.00 mg/dL Final  02/14/2019 11:02 AM 1.40 (H) 0.40 - 1.20 mg/dL Final  02/14/2019 10:28 AM 1.53 (H) 0.57 - 1.00 mg/dL Final  12/19/2018 02:18 AM 1.07 (H) 0.44 - 1.00 mg/dL Final  12/18/2018 11:45 AM 1.12 (H) 0.44 - 1.00 mg/dL Final  09/12/2018 12:01 PM 1.14 0.40 - 1.20 mg/dL Final  07/20/2018 10:05 AM 1.30 (H) 0.44 - 1.00 mg/dL Final  06/03/2016 04:30 PM 0.79 0.44 - 1.00 mg/dL Final  03/30/2016 12:05 PM 0.81 0.44 - 1.00 mg/dL Final  03/12/2015 04:49 AM 1.00 0.44 - 1.00 mg/dL Final  03/11/2015 04:25 AM 1.14 (H) 0.44 - 1.00 mg/dL Final  03/10/2015 07:08 AM 0.88 0.44 - 1.00 mg/dL Final  03/09/2015 03:00 AM 0.82 0.44 - 1.00 mg/dL Final  03/08/2015 04:46 AM 0.84 0.44 - 1.00 mg/dL Final  03/07/2015 02:59 AM 0.83 0.44 - 1.00 mg/dL Final  03/06/2015 04:00 AM 0.64 0.44 - 1.00 mg/dL Final  03/05/2015 04:45 AM 0.74 0.44 - 1.00 mg/dL Final  03/04/2015 06:28 AM 0.81 0.44 - 1.00 mg/dL Final  03/03/2015 02:15 AM 0.82 0.44 - 1.00 mg/dL Final  03/02/2015 05:49 PM 0.85 0.44 - 1.00 mg/dL Final  03/02/2015 03:45 AM 0.89 0.44 - 1.00 mg/dL Final  02/28/2015 02:21 PM 0.90 0.44 - 1.00 mg/dL Final  02/28/2015 02:18 PM 0.93 0.44 - 1.00 mg/dL Final  06/15/2010 11:12 AM 0.62 0.4 - 1.2 mg/dL Final     PMHx:   Past Medical History:  Diagnosis Date  . AKI (acute kidney injury) ([place]) 03/18/2019  . Allergic rhinitis   . Allergy   . Anxiety   . Atrial fibrillation ([place])   . Carpal tunnel syndrome    left  . Cataract   . Depression   . Diabetic neuropathy ([place])   . Dyspnea   . Essential hypertension   . GERD (gastroesophageal reflux disease)   . Mixed hyperlipidemia   . Normocytic anemia 12/18/2018  . PAF (paroxysmal atrial fibrillation) ([place])    a. s/p TEE cardioversion March 2017. b. Recurrence by event monitor 04/2016.  Marland Kitchen Pneumonia   . PONV  (postoperative nausea and vomiting)    after previous bronchoscopy  . RBBB   . Restless leg syndrome   . Type 2 diabetes mellitus ([place])   . Uterine cancer [facility])     Past Surgical History:  Procedure Laterality Date  . ABDOMINAL HYSTERECTOMY     uterine cancer  . BACK SURGERY    . CARDIOVERSION N/A 03/11/2015   Procedure: CARDIOVERSION;  Surgeon: [name], MD;  Location: [facility]  ENDOSCOPY;  Service: Cardiovascular;  Laterality: N/A;  . CHOLECYSTECTOMY    . EYE SURGERY     cataracts  . FLEXIBLE BRONCHOSCOPY N/A 07/30/2018   Procedure: FLEXIBLE BRONCHOSCOPY;  Surgeon: Sinda Du, MD;  [facility];  Service: Cardiopulmonary;  Laterality: N/A;  . SPINE SURGERY    . TEE WITHOUT CARDIOVERSION N/A 03/11/2015   Procedure: TRANSESOPHAGEAL ECHOCARDIOGRAM (TEE);  Surgeon: [name], MD;  Location: [place];  Service: Cardiovascular;  Laterality: N/A;  . VIDEO BRONCHOSCOPY WITH ENDOBRONCHIAL ULTRASOUND N/A 12/19/2018   Procedure: VIDEO BRONCHOSCOPY WITH ENDOBRONCHIAL ULTRASOUND;  Surgeon: [name], MD;  Location[facility] OR;  Service: Thoracic;  Laterality: N/A;    Family Hx:  Family History  Problem Relation Age of Onset  . Heart disease Mother 43  . Hyperlipidemia Mother   . Diabetes Brother   . COPD Father 53       emphysema  . Hypertension Father   . Hyperlipidemia Father   . Diabetes Daughter   . AAA (abdominal aortic aneurysm) Son   . Diabetes Brother   . Diabetes Brother   . Diabetes Brother     Social History:  reports that she quit smoking about 14 years ago. Her smoking use included cigarettes. She started smoking about 58 years ago. She has a 50.00 pack-year smoking history. She quit smokeless tobacco use about 14 years ago. She reports that she does not drink alcohol or use drugs.  Allergies:  Allergies  Allergen Reactions  . Penicillins Hives    Has patient had a PCN reaction causing immediate rash, facial/tongue/throat swelling, SOB or  lightheadedness with hypotension: Yes Has patient had a PCN reaction causing severe rash involving mucus membranes or skin necrosis: No Has patient had a PCN reaction that required hospitalization No Has patient had a PCN reaction occurring within the last 10 years: No If all of the above answers are "NO", then may proceed with Cephalosporin use.     Medications: Prior to Admission medications   Medication Sig Start Date End Date Taking? Authorizing Provider  Accu-Chek FastClix Lancets MISC USE TO TEST BLOOD GLUCOSE FOUR TIMES DAILY Patient taking differently: 1 Device by Other route in the morning, at noon, in the evening, and at bedtime.  02/14/19  Yes Nida, Marella Chimes, MD  ACCU-CHEK GUIDE test strip USE AS INSTRUCTED FOUR TIMES DAILY Patient taking differently: 1 each by Other route in the morning, at noon, in the evening, and at bedtime.  11/06/18  Yes Nida, Marella Chimes, MD  ACCU-CHEK SMARTVIEW test strip CHECK BLOOD GLUCOSE FOUR TIMES DAILY Patient taking differently: 1 each by Other route 4 (four) times daily as needed.  03/07/16  Yes Cassandria Anger, MD  albuterol (PROVENTIL HFA;VENTOLIN HFA) 108 (90 Base) MCG/ACT inhaler Inhale 2 puffs into the lungs every 6 (six) hours as needed for wheezing or shortness of breath. 06/07/17  Yes Hagler, Apolonio Schneiders, MD  Alcohol Swabs (B-D SINGLE USE SWABS REGULAR) PADS USE FOUR TIMES DAILY 12/04/18  Yes Nida, Marella Chimes, MD  atenolol (TENORMIN) 25 MG tablet Take 25 mg by mouth daily. 02/13/19  Yes [provider]  atorvastatin (LIPITOR) 40 MG tablet TAKE 1 TABLET EVERY DAY Patient taking differently: Take 40 mg by mouth daily.  07/16/18  Yes Satira Sark, MD  b complex vitamins tablet Take 1 tablet by mouth daily.   Yes [provider]  Biotin 5000 MCG CAPS Take 1 capsule by mouth daily.   Yes [provider]  Blood Glucose Monitoring Suppl (ACCU-CHEK GUIDE) w/Device KIT 1 each by Does not apply route 4 (four)  times daily. 02/21/17  Yes Nida, Marella Chimes, MD  chlorpheniramine (EQ CHLORTABS) 4 MG tablet Take 4 mg by mouth 2 (two) times daily as needed for allergies.   Yes [provider]  cholecalciferol (VITAMIN D) 1000 units tablet Take 1,000 Units by mouth daily.   Yes [provider]  Continuous Blood Gluc Sensor (FREESTYLE LIBRE 14 DAY SENSOR) MISC Inject 1 each into the skin every 14 (fourteen) days. Use as directed. 02/19/19  Yes Nida, Marella Chimes, MD  diltiazem (CARDIZEM CD) 180 MG 24 hr capsule TAKE 1 CAPSULE EVERY DAY (DOSE INCREASED 01/04/18) Patient taking differently: Take 180 mg by mouth daily.  01/14/19  Yes Satira Sark, MD  diphenhydrAMINE (BENADRYL) 25 MG tablet Take 25 mg by mouth daily as needed for allergies.    Yes [provider]  ELIQUIS 5 MG TABS tablet TAKE 1 TABLET TWICE DAILY Patient taking differently: Take 5 mg by mouth 2 (two) times daily.  09/05/18  Yes Satira Sark, MD  flecainide (TAMBOCOR) 100 MG tablet TAKE 1 TABLET TWICE DAILY ( DOSE INCREASE ) Patient taking differently: Take 100 mg by mouth 2 (two) times daily.  11/05/18  Yes Satira Sark, MD  fluticasone furoate-vilanterol (BREO ELLIPTA) 100-25 MCG/INH AEPB Inhale 1 puff into the lungs daily. Patient taking differently: Inhale 1 puff into the lungs every other day.  09/12/18  Yes [name], MD  glucose blood (ACCU-CHEK GUIDE) test strip Use as instructed 4 x daily 02/21/17  Yes Nida, Marella Chimes, MD  insulin aspart (NOVOLOG) 100 UNIT/ML injection Inject 10-16 Units into the skin 3 (three) times daily with meals. 05/17/18  Yes Nida, Marella Chimes, MD  insulin detemir (LEVEMIR) 100 UNIT/ML injection Inject 0.5 mLs (50 Units total) into the skin at bedtime. 07/21/16  Yes Nida, Marella Chimes, MD  ipratropium-albuterol (DUONEB) 0.5-2.5 (3) MG/3ML SOLN Take 3 mLs by nebulization every 6 (six) hours as needed (wheezing/ shortness of breath).   Yes [provider]  liraglutide (VICTOZA) 18 MG/3ML SOPN Inject 0.3 mLs (1.8 mg total) into the skin daily. 08/15/18  Yes Nida, Marella Chimes, MD  lisinopril (PRINIVIL,ZESTRIL) 20 MG tablet TAKE 1 TABLET (20 MG TOTAL) BY MOUTH DAILY. Patient taking differently: Take 10 mg by mouth daily. Patient is currently taking a 1/2 tablet due to low BP 09/04/17  Yes Hagler, Apolonio Schneiders, MD  Misc Natural Products (ESTROVEN + ENERGY MAX STRENGTH) TABS Take 1 tablet by mouth daily.   Yes [provider]  Multiple Vitamin (MULTIVITAMIN) tablet Take 1 tablet by mouth daily.   Yes [provider]  omeprazole (PRILOSEC) 20 MG capsule TAKE 1 CAPSULE EVERY DAY Patient taking differently: Take 20 mg by mouth daily.  02/13/19  Yes Satira Sark, MD  sertraline (ZOLOFT) 100 MG tablet TAKE 1 TABLET (100 MG TOTAL) BY MOUTH DAILY. 09/04/17  Yes Caren Macadam, MD  torsemide (DEMADEX) 20 MG tablet Take 20 mg by mouth 2 (two) times daily as needed.  04/03/18  Yes [provider]  TRELEGY ELLIPTA 100-62.5-25 MCG/INH AEPB Inhale 1 puff into the lungs every other day.  03/08/19  Yes [provider]  vitamin E (VITAMIN E) 400 UNIT capsule Take 400 Units by mouth daily.   Yes [provider]    I have reviewed the patient's current medications.  Labs:  Results for orders placed or performed during the  hospital encounter of 03/17/19 (from the past 48 hour(s))  Glucose, capillary     Status: Abnormal   Collection Time: 03/18/19  4:15 PM  Result Value Ref Range   Glucose-Capillary 229 (H) 70 - 99 mg/dL    Comment: Glucose reference range applies only to samples taken after fasting for at least 8 hours.   Comment 1 Notify RN    Comment 2 Document in Chart   Glucose, capillary     Status: Abnormal   Collection Time: 03/18/19  9:06 PM  Result Value Ref Range   Glucose-Capillary 315 (H) 70 - 99 mg/dL    Comment: Glucose reference range applies only to samples taken after fasting for at least 8 hours.   Glucose, capillary     Status: Abnormal   Collection Time: 03/19/19  7:26 AM  Result Value Ref Range   Glucose-Capillary 157 (H) 70 - 99 mg/dL    Comment: Glucose reference range applies only to samples taken after fasting for at least 8 hours.  Basic metabolic panel     Status: Abnormal   Collection Time: 03/19/19  9:51 AM  Result Value Ref Range   Sodium 126 (L) 135 - 145 mmol/L   Potassium 4.4 3.5 - 5.1 mmol/L   Chloride 92 (L) 98 - 111 mmol/L   CO2 20 (L) 22 - 32 mmol/L   Glucose, Bld 218 (H) 70 - 99 mg/dL    Comment: Glucose reference range applies only to samples taken after fasting for at least 8 hours.   BUN 59 (H) 8 - 23 mg/dL   Creatinine, Ser 4.37 (H) 0.44 - 1.00 mg/dL    Comment: DELTA CHECK NOTED   Calcium 8.4 (L) 8.9 - 10.3 mg/dL   GFR calc non Af Amer 9 (L) >60 mL/min   GFR calc Af Amer 11 (L) >60 mL/min   Anion gap 14 5 - 15    Comment: Performed at [facility], [address], [place], [place] [address]  Glucose, capillary     Status: Abnormal   Collection Time: 03/19/19 11:18 AM  Result Value Ref Range   Glucose-Capillary 260 (H) 70 - 99 mg/dL    Comment: Glucose reference range applies only to samples taken after fasting for at least 8 hours.  Glucose, capillary     Status: Abnormal   Collection Time: 03/19/19  4:56 PM  Result Value Ref Range   Glucose-Capillary 157 (H) 70 - 99 mg/dL    Comment: Glucose reference range applies only to samples taken after fasting for at least 8 hours.   Comment 1 Notify RN    Comment 2 Document in Chart   Glucose, capillary     Status: Abnormal   Collection Time: 03/19/19  9:11 PM  Result Value Ref Range   Glucose-Capillary 158 (H) 70 - 99 mg/dL    Comment: Glucose reference range applies only to samples taken after fasting for at least 8 hours.  Basic metabolic panel     Status: Abnormal   Collection Time: 03/20/19  4:10 AM  Result Value Ref Range   Sodium 127 (L) 135 - 145 mmol/L   Potassium 5.1 3.5 - 5.1 mmol/L    Chloride 93 (L) 98 - 111 mmol/L   CO2 18 (L) 22 - 32 mmol/L   Glucose, Bld 141 (H) 70 - 99 mg/dL    Comment: Glucose reference range applies only to samples taken after fasting for at least 8 hours.   BUN 61 (H) 8 -  23 mg/dL   Creatinine, Ser 4.27 (H) 0.44 - 1.00 mg/dL   Calcium 8.7 (L) 8.9 - 10.3 mg/dL   GFR calc non Af Amer 10 (L) >60 mL/min   GFR calc Af Amer 11 (L) >60 mL/min   Anion gap 16 (H) 5 - 15    Comment: Performed at [facility] [address], [place], [place] [address]  Glucose, capillary     Status: Abnormal   Collection Time: 03/20/19  6:27 AM  Result Value Ref Range   Glucose-Capillary 156 (H) 70 - 99 mg/dL    Comment: Glucose reference range applies only to samples taken after fasting for at least 8 hours.  Type and screen [place]     Status: None   Collection Time: 03/20/19 11:13 AM  Result Value Ref Range   ABO/RH(D) O POS    Antibody Screen NEG    Sample Expiration      03/23/2019,2359 Performed at [facility], [place] [address] [place], [place] [address]   ABO/Rh     Status: None   Collection Time: 03/20/19 11:13 AM  Result Value Ref Range   ABO/RH(D)      O POS Performed at [facility] [address], [place], [place] [address]   Glucose, capillary     Status: Abnormal   Collection Time: 03/20/19 11:17 AM  Result Value Ref Range   Glucose-Capillary 195 (H) 70 - 99 mg/dL    Comment: Glucose reference range applies only to samples taken after fasting for at least 8 hours.   Comment 1 Notify RN    Comment 2 Document in Chart   Reticulocytes     Status: Abnormal   Collection Time: 03/20/19 12:48 PM  Result Value Ref Range   Retic Ct Pct 2.5 0.4 - 3.1 %   RBC. 3.10 (L) 3.87 - 5.11 MIL/uL   Retic Count, Absolute 78.7 19.0 - 186.0 K/uL   Immature Retic Fract 23.2 (H) 2.3 - 15.9 %    Comment: Performed at [facility] [address], [place], [place] [address]  CBC     Status: Abnormal   Collection Time:  03/20/19 12:48 PM  Result Value Ref Range   WBC 8.4 4.0 - 10.5 K/uL   RBC 3.09 (L) 3.87 - 5.11 MIL/uL   Hemoglobin 9.5 (L) 12.0 - 15.0 g/dL   HCT 28.8 (L) 36.0 - 46.0 %   MCV 93.2 80.0 - 100.0 fL   MCH 30.7 26.0 - 34.0 pg   MCHC 33.0 30.0 - 36.0 g/dL   RDW 15.1 11.5 - 15.5 %   Platelets 168 150 - 400 K/uL   nRBC 0.0 0.0 - 0.2 %    Comment: Performed at [facility], [place] [address] [place], [place] [address]     ROS:  Pertinent items are noted in HPI.  Main issue is fatigue and shortness of breath, very poor exercise endurance, just couple of steps that her out of breath.  She denies any appetite disturbance.  She does have lower extremity edema  Physical Exam: Vitals:   03/20/19 0431 03/20/19 0853  BP: (!) 124/99 108/69  Pulse: 97 (!) 112  Resp: 20 18  Temp: 97.6 F (36.4 C) 97.6 F (36.4 C)  SpO2: 94% 96%     General: Obese, pale white female currently short of breath HEENT: Pupils are equal round and reactive to light, [place] motions are intact, mucous membranes are  moist Neck: Difficult to tell JVD Heart: Irregularly irregular Lungs: Lung fields are mostly clear Abdomen: Obese, soft, nontender Extremities: Excoriated areas.  I would say 1+ edema to the level of the knees Skin: Slightly diaphoretic Neuro: Alert, appears exhausted.  Admits that she is having trouble to processing all the information  Assessment/Plan: 74 year old white female with multiple medical issues.  Creatinine 1.07 in December 2020.  Has developed a change in renal function in association with symptoms consistent with decompensated right-sided heart failure.  Kidney function worsened in the setting of hospitalization, management of atrial fibrillation with hypotension 1.Renal-acute on chronic renal failure in the setting of above.  Renal ultrasound does not show obstruction but does show some chronicity of disease.  I will check a urinalysis.  However, I suspect all of the kidney injury is  hemodynamic in nature, mostly associated with blood pressure in the 80s.  She was on an ACE inhibitor prior to admission.  Fortunately, she is currently nonoliguric and renal function is stable the last 24 hours.  There are no acute indications for dialysis at this time.  As a side note, given her multiple medical issues and her cardiac function I do not think that she would do well on dialysis and would probably advise against it 2. Hypertension/volume  -she is overloaded.  Blood pressure is soft.  She is currently on atenolol 50.  Is still having some rapid rate from her atrial fibrillation.  I think that her right-sided heart failure is the key here.  I am not sure how much reversibility there is to her cardiac dynamics.  Appreciate cardiology input.  Not sure if advanced heart failure team is needed.  Probably needs diuresis.  Concerned about dropping blood pressure.  Will try for low doses given frequently-start with 20 IV every 12 3. Anemia  -she is anemic which is not helping.  Will check iron stores and start [place] 03/20/2019, 1:23 PM

## 2019-03-20 NOTE — Consult Note (Addendum)
NAME:  Ellen Jordan, MRN:  [id number], DOB:  [birth year], LOS: 2 ADMISSION DATE:  03/17/2019, CONSULTATION DATE: 3/10 REFERRING MD:  Cyndia Skeeters, CHIEF COMPLAINT:  Dyspnea    Brief History   74 year old female w/ chronic respiratory failure admitted on 3/7 w/ weakness, exertional dyspnea and rapid HR. (of note her dyspnea has been chronic in excess of a year) Found to have AKI (in context of recent increase in diuretic). Admitted. Renal fxn worse in spite of IVFs and holding diuretics. Still having mild tachycardia. PCCM asked to see re: to what extent pulmonary contributing   History of present illness   74 year old female with history as recorded below.  Presented to the emergency room on 3/7 with chief complaint of several day history of progressive shortness of breath, weakness, and a few falls at home prior to presentation also the sensation that her heart rate was elevated.  Diagnostic evaluation: No acute on chronic renal failure with initial serum creatinine 2.99, had recently had her Demadex dosing increased Atrial flutter on telemetry She was admitted for further evaluation. Working dx multifactorial dyspnea 2/2 afib, decompensated RH failure and AKI. Treatment initially: holding diuretics, giving IVFs. Continuing rate control. On 3/9 she was mildly hypotensive Cr up to 4.37 ECHO LVEF 60-65% RV severely dilated w/ decreased contractility. Mild to mod MS PCCM asked to see on 3/10 as still has sig dyspnea and worsening renal fxn and hypotension  Barrier to diuretics. Wanted pulm to see in case there was something from a pulmonary standpoint we could augment to assist w/ her treatment.   Past Medical History  COPD,  ILD but not confirmed radiographically, chronic respiratory failure, PH, afib on DOAC, diastolic HF, CKD 3-B, obesity, HTN Last seen 2/4 by MR:  Significant Hospital Events   3/7 admitted w/ dyspnea af w/ RVR  and AKI. Diuretic held. IVF started. Cards consulted. Flecainide stopped  d/t wide QRS->still on 2 liters. PCXR CM, vascular congestion 3/9 BP 90s, diuretics held, antihypertensives held. UOP decreased 3/10 scr up from 2.83 to 4.27; PCCM asked to see  Consults:  Cards pulm  Procedures:    Significant Diagnostic Tests:  3/9 ECHO LVEF 60-65% RV severely dilated w/ decreased contractility. Mild to mod M CT chest 2/19 1. Borderline mild cardiomegaly. Trace dependent right pleural effusion. Interlobular septal thickening throughout both lungs, most prominent in the lower lungs, unchanged. Findings may indicate a component of mild congestive heart failure with mild pulmonary edema. 2. Mild mosaic attenuation in both lungs, which appears to represent mild air trapping due to small airways disease. No compelling findings of interstitial lung disease. Thick parenchymal bands in the mid lungs suggest nonspecific postinfectious/postinflammatory scarring or atelectasis. 3. Scattered solid pulmonary nodules, largest 8 mm in the apical right upper lobe, for which 7 month stability has been demonstrated, more likely benign. Suggest follow-up chest CT in 12 months. 4. Moderate mediastinal and mild bilateral hilar adenopathy is stable and more suggestive of benign etiology, with sarcoidosis not Excluded. 5. Three-vessel coronary atherosclerosis. 6. Cirrhosis. Trace perihepatic ascites. 7. Small hiatal hernia. 8. Aortic Atherosclerosis (ICD10-I70.0).    Antimicrobials:   Interim history/subjective:    Objective   Blood pressure 108/69, pulse (Abnormal) 112, temperature 97.6 F (36.4 C), temperature source Oral, resp. rate 18, height 5' 3" (1.6 m), weight 93.3 kg, SpO2 96 %.        Intake/Output Summary (Last 24 hours) at 03/20/2019 1230 Last data filed at 03/20/2019 0436 Gross per 24 hour  Intake 600 ml  Output no documentation  Net 600 ml   Filed Weights   03/17/19 2106 03/19/19 1854 03/20/19 0431  Weight: 79.4 kg 93.9 kg 93.3 kg    Examination: General:  74 year old white female. Sitting up at sink not In acute distress but has very poor activity tolerance  HENT: NCAT no JVD MMM Lungs: fine crackles bases no accessory use at rest BUT marked w/ very little exertion  Cardiovascular: regular irreg no clear mumur Abdomen: soft not tender  Extremities: warm and dry dependent edema pulses are palp Neuro: awake and alert GU: voids  Resolved Hospital Problem list     Assessment & Plan:   Acute on chronic respiratory failure Acute right heart failure/decompensated Cor Pulmonale  afib w/ RVR COPD->not in AECOPD Acute on chronic renal failure w/ cardiorenal syndrome Diastolic dysfn DM Volume overload Poor activity tolerance    Acute on chronic hypoxic respiratory failure. Multi-factorial: decompensated Cor Pulmonale, secondary PH, underlying COPD. Suspect over diuresis followed by AF w/ RVR were the contributing factors to her admission -she does not seem to be in AECOPD< no radiographical evidence of IPF flare and CT earlier in Feb really didn't seem c/w IPF, would favor at this point her PH and RV dysfxn being the major contributing factor -was on eliquis so doubt PE -think she likely is hypoxic much more chronically than we are giving her credit for which would be a large contributing factor to why she is here BUT simply treating her hypoxia won't fix her RV dysfxn Plan/rec Will check CXR now-->look for pulmonary edema Will place cont pulse ox order-->looking for exertional hypoxia and/or night time hypoxia (certainly looks like she could have OSA) Titrate oxygen for sats > 90%-->will be very important for her to have walking oximetry prior to dc No role for steroids Cont home BDs Must keep HR controlled-->don't think she will tolerate RVR well and this will contribute to cardiac dysfxn.  Cont to assess daily for diuretics (appreciate nephrology involvement)  I will reach out to cards. I wonder if she might need to have advanced HF team  see her. May even benefit from inotropic support w/ diuretics to improve CO as well as renal perfusion.   Labs   CBC: Recent Labs  Lab 03/17/19 2212 03/18/19 0505  WBC 10.8* 9.7  NEUTROABS 9.5*  --   HGB 9.4* 9.5*  HCT 30.3* 31.5*  MCV 97.4 99.1  PLT 176 660    Basic Metabolic Panel: Recent Labs  Lab 03/17/19 2212 03/18/19 0505 03/19/19 0951 03/20/19 0410  NA 128* 131* 126* 127*  K 4.3 5.0 4.4 5.1  CL 93* 96* 92* 93*  CO2 22 19* 20* 18*  GLUCOSE 203* 197* 218* 141*  BUN 44* 46* 59* 61*  CREATININE 2.99* 2.83* 4.37* 4.27*  CALCIUM 8.6* 8.6* 8.4* 8.7*   GFR: Estimated Creatinine Clearance: 12.4 mL/min (A) (by C-G formula based on SCr of 4.27 mg/dL (H)). Recent Labs  Lab 03/17/19 2212 03/18/19 0505  WBC 10.8* 9.7    Liver Function Tests: Recent Labs  Lab 03/17/19 2212 03/18/19 0505  AST 28 30  ALT 20 18  ALKPHOS 63 62  BILITOT 0.6 0.6  PROT 7.2 7.1  ALBUMIN 3.8 3.7   No results for input(s): LIPASE, AMYLASE in the last 168 hours. No results for input(s): AMMONIA in the last 168 hours.  ABG    Component Value Date/Time   PHART 7.253 (L) 03/02/2015 2028   PCO2ART 39.9  03/02/2015 2028   PO2ART 136.0 (H) 03/02/2015 2028   HCO3 17.7 (L) 03/02/2015 2028   TCO2 19 03/02/2015 2028   ACIDBASEDEF 9.0 (H) 03/02/2015 2028   O2SAT 99.0 03/02/2015 2028     Coagulation Profile: No results for input(s): INR, PROTIME in the last 168 hours.  Cardiac Enzymes: No results for input(s): CKTOTAL, CKMB, CKMBINDEX, TROPONINI in the last 168 hours.  HbA1C: Hemoglobin A1C  Date/Time Value Ref Range Status  07/23/2018 12:00 AM 6.4  Final   Hgb A1c MFr Bld  Date/Time Value Ref Range Status  03/18/2019 05:05 AM 6.7 (H) 4.8 - 5.6 % Final    Comment:    (NOTE) Pre diabetes:          5.7%-6.4% Diabetes:              >6.4% Glycemic control for   <7.0% adults with diabetes   12/18/2018 11:02 AM 7.2 (H) 4.8 - 5.6 % Final    Comment:    (NOTE) Pre diabetes:           5.7%-6.4% Diabetes:              >6.4% Glycemic control for   <7.0% adults with diabetes     CBG: Recent Labs  Lab 03/19/19 1118 03/19/19 1656 03/19/19 2111 03/20/19 0627 03/20/19 1117  GLUCAP 260* 157* 158* 156* 195*    Review of Systems:   Review of Systems  Constitutional: Positive for malaise/fatigue. Negative for chills, diaphoresis and fever.  HENT: Positive for hearing loss. Negative for congestion and nosebleeds.   Eyes: Negative.   Respiratory: Positive for shortness of breath.   Cardiovascular: Positive for palpitations and leg swelling.  Gastrointestinal: Negative.   Genitourinary: Negative.   Musculoskeletal: Negative.   Skin: Negative.   Neurological: Negative.   Endo/Heme/Allergies: Negative.   Psychiatric/Behavioral: Negative.      Past Medical History  She,  has a past medical history of AKI (acute kidney injury) ([place]) (03/18/2019), Allergic rhinitis, Allergy, Anxiety, Atrial fibrillation ([place]), Carpal tunnel syndrome, Cataract, Depression, Diabetic neuropathy ([place]), Dyspnea, Essential hypertension, GERD (gastroesophageal reflux disease), Mixed hyperlipidemia, Normocytic anemia (12/18/2018), PAF (paroxysmal atrial fibrillation) ([place]), Pneumonia, PONV (postoperative nausea and vomiting), RBBB, Restless leg syndrome, Type 2 diabetes mellitus ([place]), and Uterine cancer ([place]).   Surgical History    Past Surgical History:  Procedure Laterality Date  . ABDOMINAL HYSTERECTOMY     uterine cancer  . BACK SURGERY    . CARDIOVERSION N/A 03/11/2015   Procedure: CARDIOVERSION;  Surgeon: [name], MD;  Location: [facility];  Service: Cardiovascular;  Laterality: N/A;  . CHOLECYSTECTOMY    . EYE SURGERY     cataracts  . FLEXIBLE BRONCHOSCOPY N/A 07/30/2018   Procedure: FLEXIBLE BRONCHOSCOPY;  Surgeon: Sinda Du, MD;  [facility];  Service: Cardiopulmonary;  Laterality: N/A;  . SPINE SURGERY    . TEE WITHOUT CARDIOVERSION N/A 03/11/2015    Procedure: TRANSESOPHAGEAL ECHOCARDIOGRAM (TEE);  Surgeon: [name], MD;  Location: [facility];  Service: Cardiovascular;  Laterality: N/A;  . VIDEO BRONCHOSCOPY WITH ENDOBRONCHIAL ULTRASOUND N/A 12/19/2018   Procedure: VIDEO BRONCHOSCOPY WITH ENDOBRONCHIAL ULTRASOUND;  Surgeon: [name], MD;  Location: [facility];  Service: Thoracic;  Laterality: N/A;     Social History   reports that she quit smoking about 14 years ago. Her smoking use included cigarettes. She started smoking about 58 years ago. She has a 50.00 pack-year smoking history. She quit smokeless tobacco use about 14 years ago.  She reports that she does not drink alcohol or use drugs.   Family History   Her family history includes AAA (abdominal aortic aneurysm) in her son; COPD (age of onset: 61) in her father; Diabetes in her brother, brother, brother, brother, and daughter; Heart disease (age of onset: 62) in her mother; Hyperlipidemia in her father and mother; Hypertension in her father.   Allergies Allergies  Allergen Reactions  . Penicillins Hives    Has patient had a PCN reaction causing immediate rash, facial/tongue/throat swelling, SOB or lightheadedness with hypotension: Yes Has patient had a PCN reaction causing severe rash involving mucus membranes or skin necrosis: No Has patient had a PCN reaction that required hospitalization No Has patient had a PCN reaction occurring within the last 10 years: No If all of the above answers are "NO", then may proceed with Cephalosporin use.      Home Medications  Prior to Admission medications   Medication Sig Start Date End Date Taking? Authorizing Provider  Accu-Chek FastClix Lancets MISC USE TO TEST BLOOD GLUCOSE FOUR TIMES DAILY Patient taking differently: 1 Device by Other route in the morning, at noon, in the evening, and at bedtime.  02/14/19  Yes Nida, Marella Chimes, MD  ACCU-CHEK GUIDE test strip USE AS INSTRUCTED FOUR TIMES DAILY Patient taking  differently: 1 each by Other route in the morning, at noon, in the evening, and at bedtime.  11/06/18  Yes Nida, Marella Chimes, MD  ACCU-CHEK SMARTVIEW test strip CHECK BLOOD GLUCOSE FOUR TIMES DAILY Patient taking differently: 1 each by Other route 4 (four) times daily as needed.  03/07/16  Yes Cassandria Anger, MD  albuterol (PROVENTIL HFA;VENTOLIN HFA) 108 (90 Base) MCG/ACT inhaler Inhale 2 puffs into the lungs every 6 (six) hours as needed for wheezing or shortness of breath. 06/07/17  Yes Hagler, Apolonio Schneiders, MD  Alcohol Swabs (B-D SINGLE USE SWABS REGULAR) PADS USE FOUR TIMES DAILY 12/04/18  Yes Nida, Marella Chimes, MD  atenolol (TENORMIN) 25 MG tablet Take 25 mg by mouth daily. 02/13/19  Yes [provider]  atorvastatin (LIPITOR) 40 MG tablet TAKE 1 TABLET EVERY DAY Patient taking differently: Take 40 mg by mouth daily.  07/16/18  Yes Satira Sark, MD  b complex vitamins tablet Take 1 tablet by mouth daily.   Yes [provider]  Biotin 5000 MCG CAPS Take 1 capsule by mouth daily.   Yes [provider]  Blood Glucose Monitoring Suppl (ACCU-CHEK GUIDE) w/Device KIT 1 each by Does not apply route 4 (four) times daily. 02/21/17  Yes Nida, Marella Chimes, MD  chlorpheniramine (EQ CHLORTABS) 4 MG tablet Take 4 mg by mouth 2 (two) times daily as needed for allergies.   Yes [provider]  cholecalciferol (VITAMIN D) 1000 units tablet Take 1,000 Units by mouth daily.   Yes [provider]  Continuous Blood Gluc Sensor (FREESTYLE LIBRE 14 DAY SENSOR) MISC Inject 1 each into the skin every 14 (fourteen) days. Use as directed. 02/19/19  Yes Nida, Marella Chimes, MD  diltiazem (CARDIZEM CD) 180 MG 24 hr capsule TAKE 1 CAPSULE EVERY DAY (DOSE INCREASED 01/04/18) Patient taking differently: Take 180 mg by mouth daily.  01/14/19  Yes Satira Sark, MD  diphenhydrAMINE (BENADRYL) 25 MG tablet Take 25 mg by mouth daily as needed for allergies.    Yes  [provider]  ELIQUIS 5 MG TABS tablet TAKE 1 TABLET TWICE DAILY Patient taking differently: Take 5 mg by mouth 2 (two) times  daily.  09/05/18  Yes Satira Sark, MD  flecainide (TAMBOCOR) 100 MG tablet TAKE 1 TABLET TWICE DAILY ( DOSE INCREASE ) Patient taking differently: Take 100 mg by mouth 2 (two) times daily.  11/05/18  Yes Satira Sark, MD  fluticasone furoate-vilanterol (BREO ELLIPTA) 100-25 MCG/INH AEPB Inhale 1 puff into the lungs daily. Patient taking differently: Inhale 1 puff into the lungs every other day.  09/12/18  Yes [name], MD  glucose blood (ACCU-CHEK GUIDE) test strip Use as instructed 4 x daily 02/21/17  Yes Nida, Marella Chimes, MD  insulin aspart (NOVOLOG) 100 UNIT/ML injection Inject 10-16 Units into the skin 3 (three) times daily with meals. 05/17/18  Yes Nida, Marella Chimes, MD  insulin detemir (LEVEMIR) 100 UNIT/ML injection Inject 0.5 mLs (50 Units total) into the skin at bedtime. 07/21/16  Yes Nida, Marella Chimes, MD  ipratropium-albuterol (DUONEB) 0.5-2.5 (3) MG/3ML SOLN Take 3 mLs by nebulization every 6 (six) hours as needed (wheezing/ shortness of breath).   Yes [provider]  liraglutide (VICTOZA) 18 MG/3ML SOPN Inject 0.3 mLs (1.8 mg total) into the skin daily. 08/15/18  Yes Nida, Marella Chimes, MD  lisinopril (PRINIVIL,ZESTRIL) 20 MG tablet TAKE 1 TABLET (20 MG TOTAL) BY MOUTH DAILY. Patient taking differently: Take 10 mg by mouth daily. Patient is currently taking a 1/2 tablet due to low BP 09/04/17  Yes Hagler, Apolonio Schneiders, MD  Misc Natural Products (ESTROVEN + ENERGY MAX STRENGTH) TABS Take 1 tablet by mouth daily.   Yes [provider]  Multiple Vitamin (MULTIVITAMIN) tablet Take 1 tablet by mouth daily.   Yes [provider]  omeprazole (PRILOSEC) 20 MG capsule TAKE 1 CAPSULE EVERY DAY Patient taking differently: Take 20 mg by mouth daily.  02/13/19  Yes Satira Sark, MD  sertraline (ZOLOFT) 100 MG  tablet TAKE 1 TABLET (100 MG TOTAL) BY MOUTH DAILY. 09/04/17  Yes Caren Macadam, MD  torsemide (DEMADEX) 20 MG tablet Take 20 mg by mouth 2 (two) times daily as needed.  04/03/18  Yes [provider]  TRELEGY ELLIPTA 100-62.5-25 MCG/INH AEPB Inhale 1 puff into the lungs every other day.  03/08/19  Yes [provider]  vitamin E (VITAMIN E) 400 UNIT capsule Take 400 Units by mouth daily.   Yes [provider]     Critical care time: NA    Erick Colace ACNP-BC [facility] Pager # [phone] OR # [phone] if no answer   PCCM attending:  74 year old female chronic hypoxemic respiratory failure multiple medical conditions.  Pulmonary asked to see and evaluate.  Discussed with cardiology and nephrology.  Patient has severe right ventricular failure.  Likely has concomitant underlying lung disease as well as diastolic failure.  Also in atrial fibrillation.  She also now has kidney failure because of this.  At this time patient does feel significantly short of breath with exertion does have lower extremity edema.  BP 123/76 (BP Location: Left Arm)   Pulse 94   Temp (!) 97.3 F (36.3 C) (Oral)   Resp 20   Ht 5' 3" (1.6 m)   Wt 93.3 kg   SpO2 94%   BMI 36.44 kg/m   General: Elderly female resting in chair on nasal cannula O2 supplementation able to speak complete sentences. Heart: Regular rhythm R4-W5, systolic murmur left lower sternal border Lungs: Bilateral diminished bases, no crackles no wheeze Abdomen: Mildly distended some tenderness to palpation.  Labs: Reviewed, hemoglobin 9.5, serum creatinine 4.27, potassium  5.1, sodium 127.  Chest x-ray: Bilateral pleural effusions interstitial markings consistent with congestion.  Assessment: Acute on chronic hypoxemic respiratory failure, multifactorial underlying COPD in the setting of decompensated right ventricular failure, acute on chronic cor pulmonale, also complicated by atrial  fibrillation with RVR.  Plan: Discussed with nephrology, and cardiology Agree with no addition of steroids at this time.  She does not seem to be wheezing. Suspect a lot of this is from her chronically feeling RV. We appreciate heart failure involvement. Would consider inotropic support and diuresis to see if this makes any difference.  Pulmonary will be available as needed. Please not hesitate to call us.  [facility] 03/20/2019 6:30 PM

## 2019-03-20 NOTE — Consult Note (Addendum)
Advanced Heart Failure Team Consult Note   Primary Physician: [name], MD PCP-Cardiologist:  Rozann Lesches, MD  AHF: New (Dr. Aundra Dubin)  Reason for Consultation: Cor Pulmonale/ Pulmonary HTN  HPI:    Ellen Jordan is seen today for evaluation of Cor Pulmonale/ Pulmonary HTN at the request of, Dr. Marlou Porch, General Cardiology.   74 y/o female, followed by Dr. Domenic Polite, w/ a h/o chronic hypoxic respiratory failure 2/2 COPD and ILD, chronic home O2 (2L/min baseline), former smoker (quit 23 years ago), no formal sleep evaluation, persistent atrial fibrillation, chronic anticoagulation therapy w/ Eliquis, T2DM, systemic hypertension and [name].   Initially presented to [facility] 3/7 w/ complaints of worsening dyspnea, increased HR/palpitations, weakness and falls at home over the last week. Also roughly 25 lb wt gain in just 3-4 days. She tried increasing her home dose of torsemide w/ little improvement. In the ED, she was found to have an AKI w/ SCr up to 2.99 (previous baseline ~1.4). K 4.3. Na 128. CXR showed cardiomegaly w/ vascular congestion. EKG shows atrial flutter 149 bpm, w/ RAD and RBBB (QRS duration widened compared to previous baseline). COVID negative. BNP elevated at 338. Hs troponin normal x 3.   She was admitted for further w/u. Diuretics held for AKI however she has developed progressive renal failure w/ Scr peaking at 4.37.  Flecainide discontinued due to increased QRS duration compared to prior baseline and persistent arrhythmia. HR improved w/ combination of atenolol and Cardizem. 2D echo was obtained showing normal LVEF, 60-65%. RV moderately to severely dilated w/ mild systolic dysfunction, moderately elevated pulmonary artery systolic pressure. The estimated right ventricular systolic pressure is 10.2 mmHg. Moderate TR. Also found to have significantly abnormal mitral valve w/ severe calcification of leaflets and annulus, cusp excursion suggest severe stenosis however mean gradient  suggest mild stenosis.  Aortic valve mildly stenotic. She was subsequently transferred to [facility] for further cardiac w/u, as well as nephrology and pulmonary consultations.   She remains markedly volume overloaded. She reports that she is making some urine, although not charted. SCr is slightly improved today, down from 4.37>>4.27. She has been seen by nephrology. Renal ultrasound does not show obstruction but does show some chronicity of disease. UA pending. Given her multiple co morbidities and HF, she is not felt to be a good candidate for HD. Of note, she has had some transient hypotension this admit w/ SBPs in the 80s. Most recent BP 115/78.       Echo 03/19/19 Left ventricular ejection fraction, by estimation, is 60 to 65%. The left ventricle has normal function. The left ventricle has no regional wall motion abnormalities. Left ventricular diastolic parameters are indeterminate. There is the interventricular septum is flattened in systole and diastole, consistent with right ventricular pressure and volume overload. 2. Right ventricular systolic function is mildly reduced. The right ventricular size is moderately to severely enlarged. There is moderately elevated pulmonary artery systolic pressure. The estimated right ventricular systolic pressure is 11.1 mmHg. 3. Left atrial size was mildly dilated. 4. Right atrial size was moderately dilated. 5. Mitral leaflets show significantly decreased excursion, although mean gradient suggests mild mitral stenosis.. The mitral valve is degenerative, severely calcified and associated with severe annular calcification. Trivial mitral valve regurgitation. 6. Tricuspid valve regurgitation is moderate. 7. The aortic valve is tricuspid. Aortic valve regurgitation is not visualized. Mild aortic valve stenosis. Aortic valve mean gradient measures 5.7 mmHg. Aortic valve Vmax measures 1.44 m/s. 8. The inferior vena cava is  dilated in size with <50% respiratory  variability, suggesting right atrial pressure of 15 mmHg.  Review of Systems: [y] = yes, _0  = no   . General: Weight gain _1 ; Weight loss _2 ; Anorexia _3 ; Fatigue _4 ; Fever _5 ; Chills _6 ; Weakness _7   . Cardiac: Chest pain/pressure _8 ; Resting SOB _9 ; Exertional SOB _10 ; Orthopnea _11 ; Pedal Edema _12 ; Palpitations _13 ; Syncope _14 ; Presyncope _15 ; Paroxysmal nocturnal dyspnea_16   . Pulmonary: Cough _17 ; Wheezing_18 ; Hemoptysis_19 ; Sputum _20 ; Snoring _21   . GI: Vomiting_22 ; Dysphagia_23 ; Melena_24 ; Hematochezia _25 ; Heartburn_26 ; Abdominal pain _27 ; Constipation _28 ; Diarrhea _29 ; BRBPR _30   . GU: Hematuria_31 ; Dysuria _32 ; Nocturia_33   . Vascular: Pain in legs with walking _34 ; Pain in feet with lying flat _35 ; Non-healing sores _36 ; Stroke _37 ; TIA _38 ; Slurred speech _39 ;  . Neuro: Headaches_40 ; Vertigo_41 ; Seizures_42 ; Paresthesias_43 ;Blurred vision _44 ; Diplopia _45 ; Vision changes _46   . Ortho/Skin: Arthritis _47 ; Joint pain _48 ; Muscle pain _49 ; Joint swelling _50 ; Back Pain _51 ; Rash _52   . Psych: Depression_53 ; Anxiety_54   . Heme: Bleeding problems _55 ; Clotting disorders _56 ; Anemia _57   . Endocrine: Diabetes _58 ; Thyroid dysfunction_59   Home Medications Prior to Admission medications   Medication Sig Start Date End Date Taking? Authorizing Provider  Accu-Chek FastClix Lancets MISC USE TO TEST BLOOD GLUCOSE FOUR TIMES DAILY Patient taking differently: 1 Device by Other route in the morning, at noon, in the evening, and at bedtime.  02/14/19  Yes Nida, Marella Chimes, MD  ACCU-CHEK GUIDE test strip USE AS INSTRUCTED FOUR TIMES DAILY Patient taking differently: 1 each by Other route in the morning, at noon, in the evening, and at bedtime.  11/06/18  Yes Nida, Marella Chimes, MD  ACCU-CHEK SMARTVIEW test strip CHECK BLOOD GLUCOSE FOUR TIMES DAILY Patient taking differently: 1 each by Other route 4 (four) times daily as needed.  03/07/16  Yes Cassandria Anger, MD    albuterol (PROVENTIL HFA;VENTOLIN HFA) 108 (90 Base) MCG/ACT inhaler Inhale 2 puffs into the lungs every 6 (six) hours as needed for wheezing or shortness of breath. 06/07/17  Yes Hagler, Apolonio Schneiders, MD  Alcohol Swabs (B-D SINGLE USE SWABS REGULAR) PADS USE FOUR TIMES DAILY 12/04/18  Yes Nida, Marella Chimes, MD  atenolol (TENORMIN) 25 MG tablet Take 25 mg by mouth daily. 02/13/19  Yes [provider]  atorvastatin (LIPITOR) 40 MG tablet TAKE 1 TABLET EVERY DAY Patient taking differently: Take 40 mg by mouth daily.  07/16/18  Yes Satira Sark, MD  b complex vitamins tablet Take 1 tablet by mouth daily.   Yes [provider]  Biotin 5000 MCG CAPS Take 1 capsule by mouth daily.   Yes [provider]  Blood Glucose Monitoring Suppl (ACCU-CHEK GUIDE) w/Device KIT 1 each by Does not apply route 4 (four) times daily. 02/21/17  Yes Nida, Marella Chimes, MD  chlorpheniramine (EQ CHLORTABS) 4 MG tablet Take 4 mg by mouth 2 (two) times daily as needed for allergies.   Yes [provider]  cholecalciferol (VITAMIN D) 1000 units tablet Take 1,000 Units by mouth daily.   Yes [provider]  Continuous Blood Gluc Sensor (FREESTYLE LIBRE 14 DAY SENSOR) MISC Inject 1 each  into the skin every 14 (fourteen) days. Use as directed. 02/19/19  Yes Nida, Marella Chimes, MD  diltiazem (CARDIZEM CD) 180 MG 24 hr capsule TAKE 1 CAPSULE EVERY DAY (DOSE INCREASED 01/04/18) Patient taking differently: Take 180 mg by mouth daily.  01/14/19  Yes Satira Sark, MD  diphenhydrAMINE (BENADRYL) 25 MG tablet Take 25 mg by mouth daily as needed for allergies.    Yes [provider]  ELIQUIS 5 MG TABS tablet TAKE 1 TABLET TWICE DAILY Patient taking differently: Take 5 mg by mouth 2 (two) times daily.  09/05/18  Yes Satira Sark, MD  flecainide (TAMBOCOR) 100 MG tablet TAKE 1 TABLET TWICE DAILY ( DOSE INCREASE ) Patient taking differently: Take 100 mg by mouth 2 (two) times  daily.  11/05/18  Yes Satira Sark, MD  fluticasone furoate-vilanterol (BREO ELLIPTA) 100-25 MCG/INH AEPB Inhale 1 puff into the lungs daily. Patient taking differently: Inhale 1 puff into the lungs every other day.  09/12/18  Yes [name], MD  glucose blood (ACCU-CHEK GUIDE) test strip Use as instructed 4 x daily 02/21/17  Yes Nida, Marella Chimes, MD  insulin aspart (NOVOLOG) 100 UNIT/ML injection Inject 10-16 Units into the skin 3 (three) times daily with meals. 05/17/18  Yes Nida, Marella Chimes, MD  insulin detemir (LEVEMIR) 100 UNIT/ML injection Inject 0.5 mLs (50 Units total) into the skin at bedtime. 07/21/16  Yes Nida, Marella Chimes, MD  ipratropium-albuterol (DUONEB) 0.5-2.5 (3) MG/3ML SOLN Take 3 mLs by nebulization every 6 (six) hours as needed (wheezing/ shortness of breath).   Yes [provider]  liraglutide (VICTOZA) 18 MG/3ML SOPN Inject 0.3 mLs (1.8 mg total) into the skin daily. 08/15/18  Yes Nida, Marella Chimes, MD  lisinopril (PRINIVIL,ZESTRIL) 20 MG tablet TAKE 1 TABLET (20 MG TOTAL) BY MOUTH DAILY. Patient taking differently: Take 10 mg by mouth daily. Patient is currently taking a 1/2 tablet due to low BP 09/04/17  Yes Hagler, Apolonio Schneiders, MD  Misc Natural Products (ESTROVEN + ENERGY MAX STRENGTH) TABS Take 1 tablet by mouth daily.   Yes [provider]  Multiple Vitamin (MULTIVITAMIN) tablet Take 1 tablet by mouth daily.   Yes [provider]  omeprazole (PRILOSEC) 20 MG capsule TAKE 1 CAPSULE EVERY DAY Patient taking differently: Take 20 mg by mouth daily.  02/13/19  Yes Satira Sark, MD  sertraline (ZOLOFT) 100 MG tablet TAKE 1 TABLET (100 MG TOTAL) BY MOUTH DAILY. 09/04/17  Yes Caren Macadam, MD  torsemide (DEMADEX) 20 MG tablet Take 20 mg by mouth 2 (two) times daily as needed.  04/03/18  Yes [provider]  TRELEGY ELLIPTA 100-62.5-25 MCG/INH AEPB Inhale 1 puff into the lungs every other day.  03/08/19  Yes [provider]  vitamin E (VITAMIN E) 400 UNIT capsule Take 400 Units by mouth daily.   Yes [provider]    Past Medical History: Past Medical History:  Diagnosis Date  . AKI (acute kidney injury) ([place]) 03/18/2019  . Allergic rhinitis   . Allergy   . Anxiety   . Atrial fibrillation ([place])   . Carpal tunnel syndrome    left  . Cataract   . Depression   . Diabetic neuropathy ([place])   . Dyspnea   . Essential hypertension   . GERD (gastroesophageal reflux disease)   . Mixed hyperlipidemia   . Normocytic anemia 12/18/2018  . PAF (paroxysmal atrial fibrillation) ([place])    a. s/p TEE cardioversion March 2017. b. Recurrence  by event monitor 04/2016.  Marland Kitchen Pneumonia   . PONV (postoperative nausea and vomiting)    after previous bronchoscopy  . RBBB   . Restless leg syndrome   . Type 2 diabetes mellitus ([place])   . Uterine cancer [facility])     Past Surgical History: Past Surgical History:  Procedure Laterality Date  . ABDOMINAL HYSTERECTOMY     uterine cancer  . BACK SURGERY    . CARDIOVERSION N/A 03/11/2015   Procedure: CARDIOVERSION;  Surgeon: [name], MD;  Location: [place];  Service: Cardiovascular;  Laterality: N/A;  . CHOLECYSTECTOMY    . EYE SURGERY     cataracts  . FLEXIBLE BRONCHOSCOPY N/A 07/30/2018   Procedure: FLEXIBLE BRONCHOSCOPY;  Surgeon: Sinda Du, MD;  [facility];  Service: Cardiopulmonary;  Laterality: N/A;  . SPINE SURGERY    . TEE WITHOUT CARDIOVERSION N/A 03/11/2015   Procedure: TRANSESOPHAGEAL ECHOCARDIOGRAM (TEE);  Surgeon: [name], MD;  Location: [place];  Service: Cardiovascular;  Laterality: N/A;  . VIDEO BRONCHOSCOPY WITH ENDOBRONCHIAL ULTRASOUND N/A 12/19/2018   Procedure: VIDEO BRONCHOSCOPY WITH ENDOBRONCHIAL ULTRASOUND;  Surgeon: [name], MD;  Location: [facility] OR;  Service: Thoracic;  Laterality: N/A;    Family History: Family History  Problem Relation Age of Onset  . Heart disease Mother 69  .  Hyperlipidemia Mother   . Diabetes Brother   . COPD Father 55       emphysema  . Hypertension Father   . Hyperlipidemia Father   . Diabetes Daughter   . AAA (abdominal aortic aneurysm) Son   . Diabetes Brother   . Diabetes Brother   . Diabetes Brother     Social History: Social History   Socioeconomic History  . Marital status: Married    Spouse name: earl  . Number of children: 2  . Years of education: 65  . Highest education level: Not on file  Occupational History  . Occupation: retired  Tobacco Use  . Smoking status: Former Smoker    Packs/day: 1.00    Years: 50.00    Pack years: 50.00    Types: Cigarettes    Start date: 01/10/1961    Quit date: 01/10/2005    Years since quitting: 14.1  . Smokeless tobacco: Former Systems developer    Quit date: 01/10/2005  Substance and Sexual Activity  . Alcohol use: No    Alcohol/week: 0.0 standard drinks  . Drug use: No  . Sexual activity: Not Currently    Partners: Male  Other Topics Concern  . Not on file  Social History Narrative   Moved to this area in 4 from [place].    Married for over 50 years. Has two children. Lives with husband [name] (earl)   Likes to read   Retired from [employer].   Enjoys time with family, beach, read, exericse.   Walk dog.   Eats all food groups.   Wear seatbelt.   Wear sunscreen.   Social Determinants of Health   Financial Resource Strain:   . Difficulty of Paying Living Expenses: Not on file  Food Insecurity:   . Worried About Charity fundraiser in the Last Year: Not on file  . Ran Out of Food in the Last Year: Not on file  Transportation Needs:   . Lack of Transportation (Medical): Not on file  . Lack of Transportation (Non-Medical): Not on file  Physical Activity:   . Days of Exercise per Week: Not on file  .  Minutes of Exercise per Session: Not on file  Stress:   . Feeling of Stress : Not on file  Social Connections:   . Frequency of Communication with Friends and Family: Not on  file  . Frequency of Social Gatherings with Friends and Family: Not on file  . Attends Religious Services: Not on file  . Active Member of Clubs or Organizations: Not on file  . Attends Archivist Meetings: Not on file  . Marital Status: Not on file    Allergies:  Allergies  Allergen Reactions  . Penicillins Hives    Has patient had a PCN reaction causing immediate rash, facial/tongue/throat swelling, SOB or lightheadedness with hypotension: Yes Has patient had a PCN reaction causing severe rash involving mucus membranes or skin necrosis: No Has patient had a PCN reaction that required hospitalization No Has patient had a PCN reaction occurring within the last 10 years: No If all of the above answers are "NO", then may proceed with Cephalosporin use.     Objective:    Vital Signs:   Temp:  [97.5 F (36.4 C)-98.1 F (36.7 C)] 98.1 F (36.7 C) (03/10 1449) Pulse Rate:  [71-112] 106 (03/10 1449) Resp:  [17-20] 18 (03/10 1449) BP: (108-124)/(60-99) 115/78 (03/10 1449) SpO2:  [94 %-100 %] 100 % (03/10 1449) Weight:  [93.3 kg-93.9 kg] 93.3 kg (03/10 0431) Last BM Date: 03/14/19  Weight change: Filed Weights   03/17/19 2106 03/19/19 1854 03/20/19 0431  Weight: 79.4 kg 93.9 kg 93.3 kg    Intake/Output:   Intake/Output Summary (Last 24 hours) at 03/20/2019 1525 Last data filed at 03/20/2019 0436 Gross per 24 hour  Intake 360 ml  Output --  Net 360 ml      Physical Exam    General:  Obese elderly WF sitting in chair. No resp difficulty at rest, on supp O2 via [place] HEENT: normal Neck: supple. Elevated JVP to ear . Carotids 2+ bilat; no bruits. No lymphadenopathy or thyromegaly appreciated. Cor: PMI nondisplaced. Irregular rhythm, mildly tachy rate. No rubs, gallops or murmurs. Lungs: decreased BS bilaterally Abdomen: obese, markedly distended, nontender No hepatosplenomegaly. No bruits or masses. Good bowel sounds. Extremities: no cyanosis, clubbing, rash, 2+  bilateral LEE edema up to thighs Musculoskeletal: kyphosis of thoracic spine  Neuro: alert & orientedx3, cranial nerves grossly intact. moves all 4 extremities w/o difficulty. Affect pleasant   Telemetry   Atrial fib/ flutter w/ CVR 90s   EKG    Admit EKG showed atrial flutter 149 bpm, RBBB, RAD, pulmonary disease pattern.   Labs   Basic Metabolic Panel: Recent Labs  Lab 03/17/19 2212 03/17/19 2212 03/18/19 0505 03/19/19 0951 03/20/19 0410  NA 128*  --  131* 126* 127*  K 4.3  --  5.0 4.4 5.1  CL 93*  --  96* 92* 93*  CO2 22  --  19* 20* 18*  GLUCOSE 203*  --  197* 218* 141*  BUN 44*  --  46* 59* 61*  CREATININE 2.99*  --  2.83* 4.37* 4.27*  CALCIUM 8.6*   < > 8.6* 8.4* 8.7*   < > = values in this interval not displayed.    Liver Function Tests: Recent Labs  Lab 03/17/19 2212 03/18/19 0505  AST 28 30  ALT 20 18  ALKPHOS 63 62  BILITOT 0.6 0.6  PROT 7.2 7.1  ALBUMIN 3.8 3.7   No results for input(s): LIPASE, AMYLASE in the last 168 hours. No results for input(s): AMMONIA in  the last 168 hours.  CBC: Recent Labs  Lab 03/17/19 2212 03/18/19 0505 03/20/19 1248  WBC 10.8* 9.7 8.4  NEUTROABS 9.5*  --   --   HGB 9.4* 9.5* 9.5*  HCT 30.3* 31.5* 28.8*  MCV 97.4 99.1 93.2  PLT 176 192 168    Cardiac Enzymes: No results for input(s): CKTOTAL, CKMB, CKMBINDEX, TROPONINI in the last 168 hours.  BNP: BNP (last 3 results) Recent Labs    03/17/19 2213  BNP 338.0*    ProBNP (last 3 results) Recent Labs    09/12/18 1201  PROBNP 51.0     CBG: Recent Labs  Lab 03/19/19 1118 03/19/19 1656 03/19/19 2111 03/20/19 0627 03/20/19 1117  GLUCAP 260* 157* 158* 156* 195*    Coagulation Studies: No results for input(s): LABPROT, INR in the last 72 hours.   Imaging   US RENAL  Result Date: 03/20/2019 CLINICAL DATA:  Acute kidney injury EXAM: RENAL / URINARY TRACT ULTRASOUND COMPLETE COMPARISON:  None. FINDINGS: Right Kidney: Renal measurements: 12.5  x 4.9 x 6.5 cm = volume: 209 mL. Cortical thinning. Normal echotexture. No mass or hydronephrosis. Left Kidney: Renal measurements: 12.0 x 5.6 x 6.5 cm = volume: 228 mL. Cortical thinning. Normal echotexture. No mass or hydronephrosis. Bladder: Not visualized, decompressed Other: None. IMPRESSION: Cortical thinning bilaterally. No acute findings. No hydronephrosis. Electronically Signed   By: Rolm Baptise M.D.   On: 03/20/2019 10:12   DG Chest Port 1 View  Result Date: 03/20/2019 CLINICAL DATA:  Shortness of breath. EXAM: PORTABLE CHEST 1 VIEW COMPARISON:  Ultrasound 03/17/2019. FINDINGS: Cardiomegaly with bilateral pulmonary interstitial prominence and bilateral costophrenic angle blunting. Findings suggest congestive heart failure with interstitial edema and small bilateral pleural effusions. Pneumonitis cannot be excluded. Known pneumothorax. IMPRESSION: Findings suggest congestive heart failure bilateral interstitial edema small bilateral pleural effusions. Electronically Signed   By: Marcello Moores  Register   On: 03/20/2019 14:49      Medications:     Current Medications: . apixaban  5 mg Oral BID  . atenolol  50 mg Oral Daily  . atorvastatin  40 mg Oral Daily  . darbepoetin (ARANESP) injection - NON-DIALYSIS  60 mcg Subcutaneous Q Wed-1800  . fluticasone furoate-vilanterol  1 puff Inhalation Daily  . furosemide  20 mg Intravenous Q12H  . insulin aspart  0-9 Units Subcutaneous TID WC  . insulin detemir  50 Units Subcutaneous QHS  . mouth rinse  15 mL Mouth Rinse BID  . pantoprazole  40 mg Oral Daily  . sertraline  100 mg Oral Daily  . sodium chloride flush  3 mL Intravenous Q12H     Infusions: . sodium chloride        Assessment/Plan   1. Decompensated Right Sided HF/ Cor Pulmonale:  - RV failure in the setting of pulmonary hypertension from underlying pulmonary disease.  - Echo 3/9 w/ normal LVEF 60-65%. RV moderately to severely dilated w/ mild systolic dysfunction (previously  normal) w/ moderately elevated pulmonary artery systolic pressure. The estimated right ventricular systolic pressure is 66.4 mmHg. Moderate TR. - Markedly volume overloaded w/ NYHA IIIb symptoms, AKI w/ SCr up to 4.2 and failure to respond to diuretics - Suspect she may need inotropic support w/ dobutamine to help support RV and aid w/ diuresis.  - Will start trial of dobutamine 3 mcg/kg/min. Discussed w/ nephrology. Not a HD candidate. Will place PICC line.  - can try increasing diuretics w/ inotrope's on board. Will start IV Lasix 120 mg BID after  dobutamine started - add unna boots   2. Pulmonary HTN: - suspect primarily WHO group 3 from underlying lung disease and possible contributions from [facility] group 2 (left sided heart disease from mitral stenosis) - continue supp O2 - given body habitus, mitral valve disease/ afib/flutter would recommend sleep study evaluation to r/o OSA - PE unlikely given compliance w/ Eliquis  - Needs diuresis (trial of dobutamine + high dose IV Lasix as outlined above) - Will also arrange RHC this admit  3. AKI: - Baseline SCr 1.4. 2.99 on admit and peaked to 4.37 - slightly improve today at 4.27. K 5.1  - She reports that she is making some urine, although not charted.  - Renal ultrasound does not show obstruction but does show some chronicity of disease.  -UA pending. -Nephrology following and does not feel she would be a good HD candidate. I have personally discussed w/ Dr. Moshe Cipro and she is ok w/ placement of a PICC Line. -suspect likely hemodynamically mediated from RV failure.  - will hopefully improve w/ inotropes + high dose diuretics   - will stop atenolol w/ renal disease.    4. Chronic Atrial Fib/Flutter:  - suspect likely 2/2 underlying lung disease - also ? OSA. Will need outpatient sleep study  - currently rate controlled and on Eliquis for a/c - Flecainide discontinued due to increased QRS duration compared to prior baseline - Given  arrhythmogenic potential of dobutamine, will start IV amiodarone for rate control - start 150 mg bolus followed by gtt, 30 mg/hr   5. Mitral Valve Disease:  -  2D echo w/ significantly abnormal mitral valve w/ severe calcification of leaflets and annulus, cusp excursion suggest severe stenosis however mean gradient suggest mild stenosis. - Given current respiratory status, doubt she would tolerate TEE  6. Hyponatremia:  - Na 127. Mentation ok - suspect hypervolemic hyponatremia  - will try to diurese as outlined above    Length of Stay: 2  Lyda Jester, PA-C  03/20/2019, 3:25 PM  Advanced Heart Failure Team Pager [phone] (M-F; [place])  Please contact [facility] for night-coverage after hours (4p -7a ) and weekends on [email or web address]  Patient seen with PA, agree with the above note.   She was admitted with volume overload and dyspnea, she has developed significant AKI in the hospital, creatinine 4.27 today with poor UOP.   I reviewed her echo, LV EF is 65-70%, the RV is moderate-severely dilated with moderate dysfunction.  There is a D-shaped septum.  The mitral valve is severely calcified though mean gradient is only about 5 mmHg suggesting mild to moderate mitral stenosis (pressure half-time does not appear short). Mild AS.   She has been in atrial flutter since at least 12/20.  Her QRS complex is very broad.   General: NAD Neck: JVP 14-16 cm, no thyromegaly or thyroid nodule.  Lungs: Crackles at bases.  CV: Nondisplaced PMI.  Heart irregular S1/S2, no S3/S4, 2/6 SEM RUSB.  2+ edema to thighs.  No carotid bruit.  Difficult to palpate pedal pulses.  Abdomen: Soft, nontender, no hepatosplenomegaly, no distention.  Skin: Intact without lesions or rashes.  Neurologic: Alert and oriented x 3.  Psych: Normal affect. Extremities: No clubbing or cyanosis.  HEENT: Normal.   1. RV failure: Echo this admission shows LV EF 65-70% with RV moderate-severely dilated and moderate  dysfunction, D-shaped septum, the mitral valve is severely calcified though mean gradient is only about 5 mmHg suggesting mild to moderate mitral stenosis (  pressure half-time does not appear short), mild AS.  The patient is markedly volume overloaded.  I am concerned that she has possible end-stage RV dysfunction from pulmonary hypertension.  She also has mitral stenosis which visually looks significant but measures only mild-moderate by mean gradient and pressure half-time.  She is now oliguric with creatinine up to 4.27.  - I think that it would be reasonable to try an inotrope empirically to see if we can improve RV function enough to achieve diuresis.  Given her markedly low GFR and soft BP at times, I will start her on dobutamine 3 mcg/kg/min.   - She would be a poor dialysis candidate renal has ok'd PICC placement.  Will place this evening to follow CVP and co-ox.  - I will start her on Lasix 120 mg IV bid while on dobutamine to see if we can achieve better diuresis.  - Ultimately, I am concerned that she may have reached end-stage RV failure.  Will see if we can turn her around with inotrope but I am not optimistic.  2. Pulmonary hypertension: PASP estimated at 41 mmHg on echo this admission and 42 mmHg on echo in 2/20.  However, there has been suspicion for severe pulmonary hypertension based on dilated and dysfunctional RV.  There was concern for group 3 PH but recent high resolution chest CT in 2/21 does not show marked lung abnormality (there does not seem to be severe emphysema or interstitial lung disease). It is possible that she has had group 1 PH (or group 2 PH if mitral stenosis is worse than it appears by echo doppler), but will need RHC for formal diagnosis.  - She will need eventual formal RHC.  3. Mitral stenosis: The valve looks severely calcified, but hemodynamics by echo are not that impressive.  Mean gradient 5 mmHg and MVA 2.66 cm^2 by PHT suggest no more than mild mitral stenosis.     - Would like to confirm severity of MS when cath is done (would require right and left heart cath to measure MV gradient invasively).  - If she has moderate to severe MS, she would not be a good candidate for vale replacement.  4. Atrial flutter: The patient appears to have been in atrial flutter since at least 12/20.  Mild RVR.   - Stop atenolol with AKI.  - Control HR with amiodarone gtt while on inotrope.  - Consider eventual DCCV, but would need to be after she titrates off dobutamine as she would be less likely to hold NSR on dobutamine.  - Continue apixaban for now, will hold tomorrow for cath Friday.  5. AKI on CKD stage 3: Creatinine up to 4.27 with poor UOP.  Suspect cardiorenal syndrome in setting of RV failure.  It is possible that she could improve with effective diuresis and lowering of renal venous pressure.  She would be a poor candidate for long-term HD with RV failure.  6. Chronic hypoxemic respiratory failure:  She has a history of smoking, but high resolution CT lungs does not show clear evidence of severe COPD or of interstitial lung disease.  It is possible that she has group 1 pulmonary HTN as cause of oxygen requirement.   [name] 03/20/2019

## 2019-03-20 NOTE — Progress Notes (Signed)
Orthopedic Tech Progress Note Patient Details:  Ellen Jordan 01-31-1945 [id number] Applied by [employer] Type of Ortho Device: Unna boot Ortho Device/Splint Location: BILATERAL LEGS Ortho Device/Splint Interventions: Application, Ordered   Post Interventions Patient Tolerated: Well Instructions Provided: Care of device, Adjustment of device   Janit Pagan 03/20/2019, 5:54 PM

## 2019-03-20 NOTE — Progress Notes (Signed)
Peripherally Inserted Central Catheter Placement  The IV Nurse has discussed with the patient and/or persons authorized to consent for the patient, the purpose of this procedure and the potential benefits and risks involved with this procedure.  The benefits include less needle sticks, lab draws from the catheter, and the patient may be discharged home with the catheter. Risks include, but not limited to, infection, bleeding, blood clot (thrombus formation), and puncture of an artery; nerve damage and irregular heartbeat and possibility to perform a PICC exchange if needed/ordered by physician.  Alternatives to this procedure were also discussed.  Bard Power PICC patient education guide, fact sheet on infection prevention and patient information card has been provided to patient /or left at bedside.    PICC/Midline Placement Documentation  PICC Double Lumen 31/51/76 PICC Right Basilic 37 cm 0 cm (Active)  Indication for Insertion or Continuance of Line Vasoactive infusions 03/20/19 1842  Exposed Catheter (cm) 0 cm 03/20/19 1842  Site Assessment Clean;Dry;Intact 03/20/19 1842  Lumen #1 Status Flushed;Saline locked;Blood return noted 03/20/19 1842  Lumen #2 Status Flushed;Saline locked;Blood return noted 03/20/19 1842  Dressing Type Transparent;Securing device 03/20/19 1842  Dressing Status Clean;Dry;Intact;Antimicrobial disc in place 03/20/19 1842  Dressing Intervention New dressing 03/20/19 1842  Dressing Change Due 03/27/19 03/20/19 Lamberton, Ayane Delancey 03/20/2019, 6:44 PM

## 2019-03-20 NOTE — Evaluation (Signed)
Occupational Therapy Evaluation Patient Details Name: Ellen Jordan MRN: [id number] DOB: [birth year] Today's Date: 03/20/2019    History of Present Illness Ellen Jordan  is a 74 y.o. female, with history of chronic pain atrial fibrillation, on chronic anticoagulation with Eliquis, diabetes mellitus type 2, anxiety, hypertension, pulmonary hypertension, came to hospital with worsening shortness of breath.  Patient says that shortness of breath got worse over the past few days.  She also complains of rectal bleeding tonight that occurs occasionally when she has hard bowel movement.  Denies chest pain, abdominal pain or dysuria.  She is followed by pulmonology for COPD and medication induced interstitial lung disease. Pt transferred to [facility] on 3/9.   Clinical Impression   Pt PTA: Pt living at home with spouse. Pt reports 3 recent falls "but I didn't hurt myself." Pt performing ADL functional mobility with minguardA in room; pt minguardA for standing at sink for grooming. Pt requiring minA for pericare in standing. Pt O2 requiring 2L  >90% O2 with exertion. Pt fatigues easily. Pt's spouse works 2 hours in AM and 2 hours in PM. Pt would benefit from continued OT skilled services for ADL, mobility and safety. OT following acutely. ** pt with recent falls and would benefit from SNF, but pt may refuse.    Follow Up Recommendations  SNF;Supervision - Intermittent(pt may refuse SNF. )    Equipment Recommendations  3 in 1 bedside commode(for wide adult)    Recommendations for Other Services       Precautions / Restrictions Precautions Precautions: Fall Restrictions Weight Bearing Restrictions: No      Mobility Bed Mobility               General bed mobility comments: up in recliner upon arrival  Transfers Overall transfer level: Needs assistance Equipment used: Rolling walker (2 wheeled) Transfers: Sit to/from Stand Sit to Stand: Min guard         General transfer comment: using  increased momentum from recliner    Balance Overall balance assessment: Needs assistance   Sitting balance-Leahy Scale: Good     Standing balance support: Bilateral upper extremity supported Standing balance-Leahy Scale: Fair                             ADL either performed or assessed with clinical judgement   ADL Overall ADL's : Needs assistance/impaired Eating/Feeding: Set up;Sitting   Grooming: Min guard;Standing   Upper Body Bathing: Min guard;Standing   Lower Body Bathing: Moderate assistance;Sitting/lateral leans;Sit to/from stand   Upper Body Dressing : Min guard;Standing   Lower Body Dressing: Moderate assistance;Sitting/lateral leans;Sit to/from stand   Toilet Transfer: Min guard;Ambulation;Regular Toilet;Grab bars;RW   Toileting- Clothing Manipulation and Hygiene: Moderate assistance;Cueing for safety;Sitting/lateral lean;Sit to/from stand       Functional mobility during ADLs: Min guard;Rolling walker General ADL Comments: Pt with decreased activity tolerance, decreased ability to care for self and decreased strength,     Vision Baseline Vision/History: Wears glasses Wears Glasses: Reading only Patient Visual Report: No change from baseline Vision Assessment?: No apparent visual deficits     Perception     Praxis      Pertinent Vitals/Pain Pain Assessment: 0-10 Pain Score: 3  Pain Location: chronic low back pain Pain Descriptors / Indicators: Aching Pain Intervention(s): Monitored during session     Hand Dominance Right   Extremity/Trunk Assessment Upper Extremity Assessment Upper Extremity Assessment: Generalized weakness   Lower Extremity Assessment  Lower Extremity Assessment: Generalized weakness   Cervical / Trunk Assessment Cervical / Trunk Assessment: Normal   Communication Communication Communication: No difficulties   Cognition Arousal/Alertness: Awake/alert Behavior During Therapy: WFL for tasks  assessed/performed Overall Cognitive Status: Within Functional Limits for tasks assessed                                     General Comments  VSS on 2L [place], pt saturating at 100% despite reports of SOB, HR tachy into low 110s.    Exercises     Shoulder Instructions      Home Living Family/patient expects to be discharged to:: Private residence Living Arrangements: Spouse/significant other Available Help at Discharge: Family;Available PRN/intermittently Type of Home: Mobile home Home Access: Stairs to enter Entrance Stairs-Number of Steps: 4 Entrance Stairs-Rails: Right Home Layout: One level     Bathroom Shower/Tub: Occupational psychologist: Standard     Home Equipment: Environmental consultant - 4 wheels;Shower seat;Grab bars - tub/shower          Prior Functioning/Environment Level of Independence: Independent with assistive device(s)        Comments: household ambulator with Rollator        OT Problem List: Decreased activity tolerance;Decreased knowledge of use of DME or AE;Decreased safety awareness;Impaired balance (sitting and/or standing);Pain      OT Treatment/Interventions: Self-care/ADL training;Therapeutic exercise;Energy conservation;DME and/or AE instruction;Therapeutic activities;Patient/family education;Balance training    OT Goals(Current goals can be found in the care plan section) Acute Rehab OT Goals Patient Stated Goal: return home and not go to rehab OT Goal Formulation: With patient Time For Goal Achievement: 04/03/19 Potential to Achieve Goals: Good ADL Goals Pt Will Perform Lower Body Dressing: with supervision;sit to/from stand Pt Will Transfer to Toilet: with min guard assist;ambulating Pt Will Perform Toileting - Clothing Manipulation and hygiene: with min guard assist;sit to/from stand Pt/caregiver will Perform Home Exercise Program: Increased strength;Both right and left upper extremity;With Supervision  OT Frequency: Min  2X/week   Barriers to D/C:            Co-evaluation              AM-PAC OT "6 Clicks" Daily Activity     Outcome Measure Help from another person eating meals?: None Help from another person taking care of personal grooming?: A Little Help from another person toileting, which includes using toliet, bedpan, or urinal?: A Little Help from another person bathing (including washing, rinsing, drying)?: A Little Help from another person to put on and taking off regular upper body clothing?: A Little Help from another person to put on and taking off regular lower body clothing?: A Lot 6 Click Score: 18   End of Session Equipment Utilized During Treatment: Rolling walker;Oxygen Nurse Communication: Mobility status  Activity Tolerance: Patient tolerated treatment well;Patient limited by fatigue Patient left: in chair;with call bell/phone within reach  OT Visit Diagnosis: Unsteadiness on feet (R26.81);Muscle weakness (generalized) (M62.81)                Time: 6734-1937 OT Time Calculation (min): 27 min Charges:  OT General Charges $OT Visit: 1 Visit OT Evaluation $OT Eval Moderate Complexity: 1 Mod OT Treatments $Self Care/Home Management : 8-22 mins  Jefferey Pica, OTR/L Acute Rehabilitation Services Pager: [phone] Office: [phone]   Parminder Cupples C 03/20/2019, 5:46 PM

## 2019-03-20 NOTE — Progress Notes (Signed)
PROGRESS NOTE  Ellen Jordan:[id number] DOB: Nov 06, 1945   PCP: [name], MD  Patient is from: Home.  Lives with her husband.  Recently started using walker due to recurrent fall and weakness.  DOA: 03/17/2019 LOS: 2  Brief Narrative / Interim history: 74 year old female with history of COPD on 2 L, ILD, A. fib on Eliquis, diastolic CHF, Pulm HTN, DM-2, CKD-3B, HTN and obesity presented to [facility] with progressive shortness of breath and rectal bleed and admitted for acute on chronic renal failure and acute on chronic diastolic CHF.   At [facility] ED, Cr 2.99 (baseline about 1.4).  CXR with mild pulmonary vascular congestion.  Cardiology consulted there but not able to diurese patient due to worsening renal function.  She was transferred to [facility] for nephrology input.  Subjective: No major events overnight or this morning.  Still with shortness of breath.  She denies chest pain, GI or UTI symptoms.  Objective: Vitals:   03/19/19 2033 03/20/19 0100 03/20/19 0431 03/20/19 0853  BP: 110/62 116/73 (!) 124/99 108/69  Pulse: 74 (!) 109 97 (!) 112  Resp: _0 Temp: 97.8 F (36.6 C)  97.6 F (36.4 C) 97.6 F (36.4 C)  TempSrc:    Oral  SpO2: 94% 100% 94% 96%  Weight:   93.3 kg   Height:        Intake/Output Summary (Last 24 hours) at 03/20/2019 1033 Last data filed at 03/20/2019 0436 Gross per 24 hour  Intake 600 ml  Output --  Net 600 ml   Filed Weights   03/17/19 2106 03/19/19 1854 03/20/19 0431  Weight: 79.4 kg 93.9 kg 93.3 kg    Examination:  GENERAL: No acute distress.  Appears well.  HEENT: MMM.  Vision and hearing grossly intact.  NECK: Supple.  No apparent JVD but difficult exam due to body habitus. RESP: On 2 L by [place].  No IWOB.  Fair aeration bilaterally.  Mild bibasilar crackles. CVS:  RRR. Heart sounds normal.  ABD/GI/GU: Bowel sounds present. Soft. Non tender.  MSK/EXT:  Moves extremities. No apparent deformity. No edema.  SKIN:  no apparent skin lesion or wound NEURO: Awake, alert and oriented appropriately.  No apparent focal neuro deficit. PSYCH: Calm. Normal affect.  Procedures:  None  Assessment & Plan: AKI on CKD-3B/azotemia: Cr 1.4 (on 02/14/2019)> 2.99 (admit)>> 4.37> 4.27.  BUN 44>> 63.  Suspect prerenal etiology.  She is also on torsemide and lisinopril at home which could contribute.  It seems renal function is plateauing -Nephrology consulted -Renal ultrasound -Urine sodium, creatinine -Urinalysis  Progressive dyspnea/DOE-multifactorial including CHF and COPD.  Might be at risk for OSA.  She is followed by [facility] pulmonology.  CT chest with high-resolution on 03/01/2019 didn't show ILD -Treat treatable causes as below -May want checking ABG -We will let PCCM now  Acute on chronic diastolic CHF: Echo with EF of 60 to 65%, indeterminate diastolic function and interventricular septum flattening in systole and diastole consistent with right ventricular pressure and volume overload.  Surprisingly no significant crackles on exam.  Has progressive dyspnea and DOE.  BNP elevated but no recent values to compare to.  She is on torsemide at home.  Now renal function limiting diuretics. -Cardiology following -Nephrology consulted -Continue monitoring fluid status, renal function and electrolytes. -Sodium and fluid restrictions.  Chronic COPD/ILD/respiratory failure on 2 L by nasal cannula at baseline. -Continue Breo Ellipta, duo nebs and supplemental oxygen -Will FYI PCCM  Permanent A. fib with wide-QRS and QTc-HR fairly controlled. QT could be exaggerated by wide QRS due to RBBB. -Cardiology managing-flecainide and Cardizem discontinued.  On atenolol now. -On Eliquis for anticoagulation. -Optimize electrolytes  Controlled DM-2 with hyperglycemia: A1c 6.7%. Recent Labs    03/19/19 1656 03/19/19 2111 03/20/19 0627  GLUCAP 157* 158* 156*  -Continue current regiment with SSI and basal insulin -Continue  statins.  Normocytic anemia: Baseline Hgb 9-10> 9.4 (admit)> 9.5.  Mention of rectal bleed on presentation.  Husband thinks she might have wiped herself hard.  She is on Eliquis. -Recheck CBC -Check anemia panel and FOBT  Anion gap metabolic acidosis: Likely due to azotemia/renal failure. -Manage renal failure as above  Hypervolemic hyponatremia-likely due to CHF or renal failure.  She is also on SSRI and lisinopril which could contribute  Anxiety and depression: Stable -Continue Zoloft and Xanax.  Hyperlipidemia -Fasting lipid panel in the morning -continue statins  GERD -continue protonix  Morbid obesity: Body mass index is 36.44 kg/m.  Patient with diabetes. -Encourage lifestyle change to lose weight -May benefit from newer agents with cardiovascular benefits  At risk for polypharmacy-noted multiple sedating medications including Benadryl, chlorpheniramine -Encouraged to avoid OTC medications  Pulmonary nodules: Noted on high-resolution CT on 02/19/2019 -Follow-up CT in 12 months as recommended.  Liver cirrhosis?  Noted on high-resolution CT last months.  Debility/Generalized weakness: recently started using walker recently -PT/OT                DVT prophylaxis: On Eliquis for A. fib Code Status: DNR/DNI Family Communication: Updated patient's husband over the phone.  Discharge barrier: Evaluation and treatment of acute renal failure and CHF exacerbation Patient is from: Home Final disposition: To be determined once medically stable and cleared by consultants.  Consultants: Cardiology, nephrology and PCCM   Microbiology summarized: COVID-19 screen negative.  Sch Meds:  Scheduled Meds: . apixaban  5 mg Oral BID  . atenolol  50 mg Oral Daily  . atorvastatin  40 mg Oral Daily  . fluticasone furoate-vilanterol  1 puff Inhalation Daily  . insulin aspart  0-9 Units Subcutaneous TID WC  . insulin detemir  50 Units Subcutaneous QHS  . mouth rinse  15 mL  Mouth Rinse BID  . pantoprazole  40 mg Oral Daily  . sertraline  100 mg Oral Daily  . sodium chloride flush  3 mL Intravenous Q12H   Continuous Infusions: . sodium chloride     PRN Meds:.sodium chloride, acetaminophen **OR** acetaminophen, albuterol, ALPRAZolam, ipratropium-albuterol, sodium chloride flush  Antimicrobials: Anti-infectives (From admission, onward)   None       I have personally reviewed the following labs and images: CBC: Recent Labs  Lab 03/17/19 2212 03/18/19 0505  WBC 10.8* 9.7  NEUTROABS 9.5*  --   HGB 9.4* 9.5*  HCT 30.3* 31.5*  MCV 97.4 99.1  PLT 176 192   BMP &GFR Recent Labs  Lab 03/17/19 2212 03/18/19 0505 03/19/19 0951 03/20/19 0410  NA 128* 131* 126* 127*  K 4.3 5.0 4.4 5.1  CL 93* 96* 92* 93*  CO2 22 19* 20* 18*  GLUCOSE 203* 197* 218* 141*  BUN 44* 46* 59* 61*  CREATININE 2.99* 2.83* 4.37* 4.27*  CALCIUM 8.6* 8.6* 8.4* 8.7*   Estimated Creatinine Clearance: 12.4 mL/min (A) (by C-G formula based on SCr of 4.27 mg/dL (H)). Liver & Pancreas: Recent Labs  Lab 03/17/19 2212 03/18/19 0505  AST 28 30  ALT 20 18  ALKPHOS 63 62  BILITOT  0.6 0.6  PROT 7.2 7.1  ALBUMIN 3.8 3.7   No results for input(s): LIPASE, AMYLASE in the last 168 hours. No results for input(s): AMMONIA in the last 168 hours. Diabetic: Recent Labs    03/18/19 0505  HGBA1C 6.7*   Recent Labs  Lab 03/19/19 0726 03/19/19 1118 03/19/19 1656 03/19/19 2111 03/20/19 0627  GLUCAP 157* 260* 157* 158* 156*   Cardiac Enzymes: No results for input(s): CKTOTAL, CKMB, CKMBINDEX, TROPONINI in the last 168 hours. Recent Labs    09/12/18 1201  PROBNP 51.0   Coagulation Profile: No results for input(s): INR, PROTIME in the last 168 hours. Thyroid Function Tests: No results for input(s): TSH, T4TOTAL, FREET4, T3FREE, THYROIDAB in the last 72 hours. Lipid Profile: No results for input(s): CHOL, HDL, LDLCALC, TRIG, CHOLHDL, LDLDIRECT in the last 72  hours. Anemia Panel: No results for input(s): VITAMINB12, FOLATE, FERRITIN, TIBC, IRON, RETICCTPCT in the last 72 hours. Urine analysis: No results found for: COLORURINE, APPEARANCEUR, LABSPEC, PHURINE, GLUCOSEU, HGBUR, BILIRUBINUR, KETONESUR, PROTEINUR, UROBILINOGEN, NITRITE, LEUKOCYTESUR Sepsis Labs: Invalid input(s): PROCALCITONIN, [place]  Microbiology: Recent Results (from the past 240 hour(s))  SARS CORONAVIRUS 2 (TAT 6-24 HRS) Nasopharyngeal Nasopharyngeal Swab     Status: None   Collection Time: 03/18/19 12:10 AM   Specimen: Nasopharyngeal Swab  Result Value Ref Range Status   SARS Coronavirus 2 NEGATIVE NEGATIVE Final    Comment: (NOTE) SARS-CoV-2 target nucleic acids are NOT DETECTED. The SARS-CoV-2 RNA is generally detectable in upper and lower respiratory specimens during the acute phase of infection. Negative results do not preclude SARS-CoV-2 infection, do not rule out co-infections with other pathogens, and should not be used as the sole basis for treatment or other patient management decisions. Negative results must be combined with clinical observations, patient history, and epidemiological information. The expected result is Negative. Fact Sheet for Patients: SugarRoll.be Fact Sheet for Healthcare Providers: https://www.woods-mathews.com/ This test is not yet approved or cleared by the [place] FDA and  has been authorized for detection and/or diagnosis of SARS-CoV-2 by FDA under an Emergency Use Authorization (EUA). This EUA will remain  in effect (meaning this test can be used) for the duration of the COVID-19 declaration under Section 56 4(b)(1) of the Act, 21 U.S.C. section 360bbb-3(b)(1), unless the authorization is terminated or revoked sooner. Performed at [facility], [place] [address] [place], [place] [address]     Radiology Studies: US RENAL  Result Date: 03/20/2019 CLINICAL DATA:  Acute  kidney injury EXAM: RENAL / URINARY TRACT ULTRASOUND COMPLETE COMPARISON:  None. FINDINGS: Right Kidney: Renal measurements: 12.5 x 4.9 x 6.5 cm = volume: 209 mL. Cortical thinning. Normal echotexture. No mass or hydronephrosis. Left Kidney: Renal measurements: 12.0 x 5.6 x 6.5 cm = volume: 228 mL. Cortical thinning. Normal echotexture. No mass or hydronephrosis. Bladder: Not visualized, decompressed Other: None. IMPRESSION: Cortical thinning bilaterally. No acute findings. No hydronephrosis. Electronically Signed   By: Rolm Baptise M.D.   On: 03/20/2019 10:12   ECHOCARDIOGRAM COMPLETE  Result Date: 03/19/2019    ECHOCARDIOGRAM REPORT   Patient Name:   NEIMA LACROSS Date of Exam: 03/19/2019 Medical Rec #:  [id number]     Height:       63.0 in Accession #:    [id number]    Weight:       175.0 lb Date of Birth:  [birth year]    BSA:          1.827 m Patient Age:  73 years      BP:           90/61 mmHg Patient Gender: F             HR:           92 bpm. Exam Location:  [facility] Procedure: 2D Echo Indications:    Atrial Flutter 427.32 / I48.92  History:        Patient has prior history of Echocardiogram examinations, most                 recent 02/23/2018. Arrythmias:Atrial Fibrillation and RBBB; Risk                 Factors:Hypertension and Diabetes. Acute kidney injury.  Sonographer:    Vikki Ports Turrentine Referring Phys: [id number] [place]  1. Left ventricular ejection fraction, by estimation, is 60 to 65%. The left ventricle has normal function. The left ventricle has no regional wall motion abnormalities. Left ventricular diastolic parameters are indeterminate. There is the interventricular septum is flattened in systole and diastole, consistent with right ventricular pressure and volume overload.  2. Right ventricular systolic function is mildly reduced. The right ventricular size is moderately to severely enlarged. There is moderately elevated pulmonary artery systolic pressure. The estimated  right ventricular systolic pressure is 88.8 mmHg.  3. Left atrial size was mildly dilated.  4. Right atrial size was moderately dilated.  5. Mitral leaflets show significantly decreased excursion, although mean gradient suggests mild mitral stenosis.. The mitral valve is degenerative, severely calcified and associated with severe annular calcification. Trivial mitral valve regurgitation.  6. Tricuspid valve regurgitation is moderate.  7. The aortic valve is tricuspid. Aortic valve regurgitation is not visualized. Mild aortic valve stenosis. Aortic valve mean gradient measures 5.7 mmHg. Aortic valve Vmax measures 1.44 m/s.  8. The inferior vena cava is dilated in size with <50% respiratory variability, suggesting right atrial pressure of 15 mmHg. FINDINGS  Left Ventricle: Left ventricular ejection fraction, by estimation, is 60 to 65%. The left ventricle has normal function. The left ventricle has no regional wall motion abnormalities. The left ventricular internal cavity size was normal in size. There is  no left ventricular hypertrophy. The interventricular septum is flattened in systole and diastole, consistent with right ventricular pressure and volume overload. Left ventricular diastolic parameters are indeterminate. Right Ventricle: The right ventricular size is severely enlarged. No increase in right ventricular wall thickness. Right ventricular systolic function is mildly reduced. There is moderately elevated pulmonary artery systolic pressure. The tricuspid regurgitant velocity is 2.55 m/s, and with an assumed right atrial pressure of 15 mmHg, the estimated right ventricular systolic pressure is 91.6 mmHg. Left Atrium: Left atrial size was mildly dilated. Right Atrium: Right atrial size was moderately dilated. Pericardium: Trivial pericardial effusion is present. The pericardial effusion is posterior to the left ventricle. Mitral Valve: Mitral leaflets show significantly decreased excursion, although mean  gradient suggests mild mitral stenosis. The mitral valve is degenerative in appearance. There is severe calcification of the mitral valve leaflet(s). Severe mitral annular calcification. Trivial mitral valve regurgitation. MV peak gradient, 15.3 mmHg. The mean mitral valve gradient is 4.5 mmHg. Tricuspid Valve: The tricuspid valve is grossly normal. Tricuspid valve regurgitation is moderate. Aortic Valve: The aortic valve is tricuspid. Aortic valve regurgitation is not visualized. Mild aortic stenosis is present. Mild to moderate aortic valve annular calcification. Aortic valve mean gradient measures 5.7 mmHg. Aortic valve peak gradient measures 8.3 mmHg. Aortic valve area,  by VTI measures 1.10 cm. Pulmonic Valve: The pulmonic valve was grossly normal. Pulmonic valve regurgitation is trivial. Aorta: The aortic root is normal in size and structure. Venous: The inferior vena cava is dilated in size with less than 50% respiratory variability, suggesting right atrial pressure of 15 mmHg. IAS/Shunts: No atrial level shunt detected by color flow Doppler.  LEFT VENTRICLE PLAX 2D LVIDd:         3.75 cm LVIDs:         2.49 cm LV PW:         0.92 cm LV IVS:        0.95 cm LVOT diam:     1.60 cm LV SV:         33 LV SV Index:   18 LVOT Area:     2.01 cm  RIGHT VENTRICLE TAPSE (M-mode): 1.8 cm LEFT ATRIUM             Index       RIGHT ATRIUM           Index LA diam:        4.80 cm 2.63 cm/m  RA Area:     25.60 cm LA Vol (A2C):   98.6 ml 53.97 ml/m RA Volume:   90.00 ml  49.26 ml/m LA Vol (A4C):   66.7 ml 36.51 ml/m LA Biplane Vol: 82.1 ml 44.94 ml/m  AORTIC VALVE AV Area (Vmax):    1.05 cm AV Area (Vmean):   0.93 cm AV Area (VTI):     1.10 cm AV Vmax:           144.33 cm/s AV Vmean:          113.667 cm/s AV VTI:            0.297 m AV Peak Grad:      8.3 mmHg AV Mean Grad:      5.7 mmHg LVOT Vmax:         75.50 cm/s LVOT Vmean:        52.800 cm/s LVOT VTI:          0.163 m LVOT/AV VTI ratio: 0.55  AORTA Ao Root diam:  2.60 cm MITRAL VALVE                TRICUSPID VALVE MV Area (PHT): 2.87 cm     TR Peak grad:   26.0 mmHg MV Peak grad:  15.3 mmHg    TR Vmax:        255.00 cm/s MV Mean grad:  4.5 mmHg MV Vmax:       1.96 m/s     SHUNTS MV Vmean:      84.4 cm/s    Systemic VTI:  0.16 m MV Decel Time: 264 msec     Systemic Diam: 1.60 cm MV E velocity: 174.00 cm/s Rozann Lesches MD Electronically signed by Rozann Lesches MD Signature Date/Time: 03/19/2019/12:40:07 PM    Final    45 minutes with more than 50% spent in reviewing records, counseling patient/family and coordinating care.  Irlene Crudup T. [place]  If 7PM-7AM, please contact night-coverage [email or web address] Password [facility] 03/20/2019, 10:33 AM

## 2019-03-20 NOTE — Progress Notes (Signed)
Chaplain provided education around Scientist, physiological.

## 2019-03-21 ENCOUNTER — Telehealth: Payer: Self-pay

## 2019-03-21 ENCOUNTER — Ambulatory Visit: Payer: Medicare HMO | Admitting: Internal Medicine

## 2019-03-21 DIAGNOSIS — I050 Rheumatic mitral stenosis: Secondary | ICD-10-CM

## 2019-03-21 DIAGNOSIS — Z7189 Other specified counseling: Secondary | ICD-10-CM

## 2019-03-21 DIAGNOSIS — Z515 Encounter for palliative care: Secondary | ICD-10-CM

## 2019-03-21 LAB — RENAL FUNCTION PANEL
Albumin: 3.1 g/dL — ABNORMAL LOW (ref 3.5–5.0)
Anion gap: 13 (ref 5–15)
BUN: 66 mg/dL — ABNORMAL HIGH (ref 8–23)
CO2: 20 mmol/L — ABNORMAL LOW (ref 22–32)
Calcium: 8.9 mg/dL (ref 8.9–10.3)
Chloride: 93 mmol/L — ABNORMAL LOW (ref 98–111)
Creatinine, Ser: 3.79 mg/dL — ABNORMAL HIGH (ref 0.44–1.00)
GFR calc Af Amer: 13 mL/min — ABNORMAL LOW (ref >60)
GFR calc non Af Amer: 11 mL/min — ABNORMAL LOW (ref >60)
Glucose, Bld: 180 mg/dL — ABNORMAL HIGH (ref 70–99)
Phosphorus: 6.6 mg/dL — ABNORMAL HIGH (ref 2.5–4.6)
Potassium: 4.6 mmol/L (ref 3.5–5.1)
Sodium: 126 mmol/L — ABNORMAL LOW (ref 135–145)

## 2019-03-21 LAB — CBC
HCT: 27.9 % — ABNORMAL LOW (ref 36.0–46.0)
Hemoglobin: 8.9 g/dL — ABNORMAL LOW (ref 12.0–15.0)
MCH: 30.1 pg (ref 26.0–34.0)
MCHC: 31.9 g/dL (ref 30.0–36.0)
MCV: 94.3 fL (ref 80.0–100.0)
Platelets: 154 10*3/uL (ref 150–400)
RBC: 2.96 MIL/uL — ABNORMAL LOW (ref 3.87–5.11)
RDW: 15.1 % (ref 11.5–15.5)
WBC: 6.3 10*3/uL (ref 4.0–10.5)
nRBC: 0 % (ref 0.0–0.2)

## 2019-03-21 LAB — LIPID PANEL
Cholesterol: 90 mg/dL (ref 0–200)
HDL: 22 mg/dL — ABNORMAL LOW (ref >40)
LDL Cholesterol: 44 mg/dL (ref 0–99)
Total CHOL/HDL Ratio: 4.1 RATIO
Triglycerides: 118 mg/dL (ref <150)
VLDL: 24 mg/dL (ref 0–40)

## 2019-03-21 LAB — IRON AND TIBC
Iron: 45 ug/dL (ref 28–170)
Saturation Ratios: 10 % — ABNORMAL LOW (ref 10.4–31.8)
TIBC: 473 ug/dL — ABNORMAL HIGH (ref 250–450)
UIBC: 428 ug/dL

## 2019-03-21 LAB — COOXEMETRY PANEL
Carboxyhemoglobin: 0.7 % (ref 0.5–1.5)
Carboxyhemoglobin: 0.8 % (ref 0.5–1.5)
Methemoglobin: 0.5 % (ref 0.0–1.5)
Methemoglobin: 0.8 % (ref 0.0–1.5)
O2 Saturation: 42.1 %
O2 Saturation: 44.1 %
Total hemoglobin: 13.1 g/dL (ref 12.0–16.0)
Total hemoglobin: 10 g/dL — ABNORMAL LOW (ref 12.0–16.0)

## 2019-03-21 LAB — GLUCOSE, CAPILLARY
Glucose-Capillary: 208 mg/dL — ABNORMAL HIGH (ref 70–99)
Glucose-Capillary: 163 mg/dL — ABNORMAL HIGH (ref 70–99)
Glucose-Capillary: 225 mg/dL — ABNORMAL HIGH (ref 70–99)
Glucose-Capillary: 227 mg/dL — ABNORMAL HIGH (ref 70–99)
Glucose-Capillary: 203 mg/dL — ABNORMAL HIGH (ref 70–99)
Glucose-Capillary: 177 mg/dL — ABNORMAL HIGH (ref 70–99)

## 2019-03-21 LAB — SODIUM: Sodium: 127 mmol/L — ABNORMAL LOW (ref 135–145)

## 2019-03-21 LAB — FERRITIN: Ferritin: 39 ng/mL (ref 11–307)

## 2019-03-21 LAB — CORTISOL-AM, BLOOD: Cortisol - AM: 10.7 ug/dL (ref 6.7–22.6)

## 2019-03-21 LAB — MAGNESIUM: Magnesium: 1.8 mg/dL (ref 1.7–2.4)

## 2019-03-21 MED ORDER — SODIUM CHLORIDE 0.9 % IV SOLN
510.0000 mg | Freq: Once | INTRAVENOUS | Status: AC
  Administered 2019-03-21: 510 mg via INTRAVENOUS
  Filled 2019-03-21: qty 17

## 2019-03-21 MED ORDER — INSULIN ASPART 100 UNIT/ML ~~LOC~~ SOLN: 0.0000 [IU] | Freq: Every day | SUBCUTANEOUS | Status: DC

## 2019-03-21 MED ORDER — SODIUM CHLORIDE 0.9 % IV SOLN
INTRAVENOUS | Status: DC | PRN
  Administered 2019-03-22 – 2019-03-24 (×3): 250 mL via INTRAVENOUS

## 2019-03-21 MED ORDER — INSULIN DETEMIR 100 UNIT/ML ~~LOC~~ SOLN
30.0000 [IU] | Freq: Two times a day (BID) | SUBCUTANEOUS | Status: DC
  Administered 2019-03-21 – 2019-03-26 (×10): 30 [IU] via SUBCUTANEOUS
  Filled 2019-03-21 (×11): qty 0.3

## 2019-03-21 MED ORDER — INSULIN ASPART 100 UNIT/ML ~~LOC~~ SOLN
0.0000 [IU] | Freq: Three times a day (TID) | SUBCUTANEOUS | Status: DC
  Administered 2019-03-21: 3 [IU] via SUBCUTANEOUS
  Administered 2019-03-22: 1 [IU] via SUBCUTANEOUS
  Administered 2019-03-22: 3 [IU] via SUBCUTANEOUS
  Administered 2019-03-22: 1 [IU] via SUBCUTANEOUS
  Administered 2019-03-23: 3 [IU] via SUBCUTANEOUS
  Administered 2019-03-23 – 2019-03-25 (×4): 2 [IU] via SUBCUTANEOUS
  Administered 2019-03-26: 1 [IU] via SUBCUTANEOUS
  Administered 2019-03-26: 3 [IU] via SUBCUTANEOUS
  Administered 2019-03-27 – 2019-03-28 (×2): 1 [IU] via SUBCUTANEOUS
  Administered 2019-03-28: 3 [IU] via SUBCUTANEOUS

## 2019-03-21 MED ORDER — METOLAZONE 2.5 MG PO TABS: 2.5000 mg | ORAL_TABLET | Freq: Once | ORAL | Status: DC

## 2019-03-21 MED ORDER — TOLVAPTAN 15 MG PO TABS
15.0000 mg | ORAL_TABLET | Freq: Once | ORAL | Status: AC
  Administered 2019-03-21: 15 mg via ORAL
  Filled 2019-03-21: qty 1

## 2019-03-21 MED ORDER — FUROSEMIDE 10 MG/ML IJ SOLN
120.0000 mg | Freq: Three times a day (TID) | INTRAVENOUS | Status: DC
  Administered 2019-03-21 – 2019-03-24 (×10): 120 mg via INTRAVENOUS
  Filled 2019-03-21 (×2): qty 10
  Filled 2019-03-21 (×3): qty 12
  Filled 2019-03-21: qty 10
  Filled 2019-03-21 (×2): qty 2
  Filled 2019-03-21 (×2): qty 12
  Filled 2019-03-21 (×2): qty 10

## 2019-03-21 MED ORDER — TRAMADOL HCL 50 MG PO TABS
50.0000 mg | ORAL_TABLET | Freq: Once | ORAL | Status: AC
  Administered 2019-03-21: 50 mg via ORAL
  Filled 2019-03-21: qty 1

## 2019-03-21 MED ORDER — INSULIN DETEMIR 100 UNIT/ML ~~LOC~~ SOLN: 20.0000 [IU] | Freq: Once | SUBCUTANEOUS | Status: DC

## 2019-03-21 NOTE — Telephone Encounter (Signed)
Patient has been scheduled on 04/11/19 with Beth at 1130.

## 2019-03-21 NOTE — Progress Notes (Signed)
Responded to Spiritual Consult for notary of AD.  Found AD completed and in storage cabinet.  Unable to notarize at the time due to a lack of volunteers in the house.  Will attempt to notarize tomorrow.  De Burrs Chaplain Resident

## 2019-03-21 NOTE — Telephone Encounter (Signed)
-----  Message from Erick Colace, NP sent at 03/21/2019  2:41 PM EST ----- Hey guys  Need to also get this person set up for hospital follow up 3 weeks w/ NP  pete

## 2019-03-21 NOTE — Progress Notes (Signed)
PROGRESS NOTE  Ellen Jordan ZDG:[id number] DOB: 1945-06-24   PCP: [name], MD  Patient is from: Home.  Lives with her husband.  Recently started using walker due to recurrent fall and weakness.  DOA: 03/17/2019 LOS: 3  Brief Narrative / Interim history: 74 year old female with history of COPD on 2 L, ILD, A. fib on Eliquis, diastolic CHF, Pulm HTN, DM-2, CKD-3B, HTN and obesity presented to [facility] with progressive shortness of breath and rectal bleed and admitted for acute on chronic renal failure and acute on chronic diastolic CHF.   At [facility] ED, Cr 2.99 (baseline about 1.4).  CXR with mild pulmonary vascular congestion.  Cardiology consulted there but not able to diurese patient due to worsening renal function.  She was transferred to [facility] for nephrology input.  Nephrology, advanced heart failure team, PCCM and palliative care consulted.  Subjective: No major events overnight or this morning.  She says she feels about the same.  Did not sleep well last night.  Still with shortness of breath.  Also complains of back pain.  She denies chest pain or GI symptoms.  She feels she is making good urine.  Objective: Vitals:   03/21/19 0604 03/21/19 0610 03/21/19 0808 03/21/19 0824  BP:  (!) 112/53 102/67   Pulse:  79 78   Resp:  17 16   Temp:   98 F (36.7 C)   TempSrc:      SpO2:  99% 98% 94%  Weight: 94.3 kg     Height:        Intake/Output Summary (Last 24 hours) at 03/21/2019 1114 Last data filed at 03/21/2019 0900 Gross per 24 hour  Intake 470.03 ml  Output 701 ml  Net -230.97 ml   Filed Weights   03/19/19 1854 03/20/19 0431 03/21/19 0604  Weight: 93.9 kg 93.3 kg 94.3 kg    Examination:  GENERAL: No acute distress.  Appears well.  HEENT: MMM.  Vision and hearing grossly intact.  NECK: Supple.  No apparent JVD sitting upright.Marland Kitchen  RESP: 100% on 2 L.  No IWOB.  Fair aeration bilaterally.  No crackles. CVS:  RRR. Heart sounds normal.    ABD/GI/GU: Bowel sounds present. Soft. Non tender.  MSK/EXT:  Moves extremities. No apparent deformity.  Unna boot over BLE SKIN: no apparent skin lesion or wound NEURO: Awake, alert and oriented appropriately.  No apparent focal neuro deficit. PSYCH: Calm. Normal affect.  Procedures:  None  Assessment & Plan: Acute on chronic diastolic CHF/RV failure/Pulm HTN: Echo with EF of 60 to 65%, indeterminate diastolic function and interventricular septum flattening in systole and diastole consistent with right ventricular pressure and volume overload.  Likely cor pulmonale from underlying lung disease.  She has no significant crackles on exam.  Has progressive dyspnea and DOE but good saturation on 2 L by [place].  BNP elevated but no recent values to compare to.  She is on torsemide at home.  Started on dobutamine and IV Lasix 120 mg twice daily.  700 cc +2 unmeasured voids charted in the last 24 hours.  Renal function improving. -Advanced HF team managing-on dobutamine, metolazone and IV Lasix 120 mg 3 times daily -Continue monitoring fluid status, renal function and electrolytes. -Sodium and fluid restrictions. -Possibly RHC this hospitalization.  Mitral valve stenosis -Plan for further evaluation with heart cath when stable -Per cardiology-not a good candidate for valve replacement  Atrial flutter with wide-QRS and QTc-HR fairly controlled. QT could be exaggerated  by wide QRS due to RBBB. -Cardiology managing-flecainide, Cardizem and atenolol discontinued. -Started on amiodarone drip while on inotropes. -On Eliquis for anticoagulation. -Optimize electrolytes -Possible DCCV down the road when off dobutamine.  AKI on CKD-3B/azotemia: Cr 1.4 (on 02/14/2019)> 2.99 (admit)>> 4.37> 4.27> 3.79.  BUN 44>> 63> 66.  Suspect prerenal etiology from hypotension and heart failure.  She is also on torsemide and lisinopril at home which could contribute. FENa 1.3% suggesting intrinsic etiology which could happen  after initial prerenal insult.  Renal US-CKD, no Hydro.  UA negative.  Renal function improving with diuretics and inotropes. -Nephrology consulted-not a candidate for HD -Continue monitoring  Progressive dyspnea/DOE-multifactorial including CHF and COPD.  Might be at risk for OSA.  She is followed by [facility] pulmonology.  CT chest with high-resolution on 03/01/2019 didn't show ILD. -Treat treatable causes as above -Wean oxygen to maintain saturation above 90% -Appreciate guidance by PCCM  Chronic COPD/respiratory failure on 2 L by nasal cannula at baseline. -Continue Breo Ellipta, duo nebs and supplemental oxygen -Appreciate insight by PCCM  Controlled DM-2 with hyperglycemia: A1c 6.7%.  On Levemir 15 units nightly and NovoLog 10 to 16 units 3 times daily at home. Recent Labs    03/21/19 0648 03/21/19 0810 03/21/19 1109  GLUCAP 163* 225* 227*  -Change Levemir to 30 units twice daily starting tonight.  We will give Levemir 20 units now -Continue SSI-thin.  Added mealtime coverage -Continue statin.  Normocytic anemia: Baseline Hgb 9-10> 9.4 (admit)> 9.5> 8.9.  Iron sat 10%.  TIBC 473.  Mention of rectal bleed on presentation.  Husband thinks she might have wiped herself hard.  She is on Eliquis. -Continue monitoring -IV iron and ESA per nephrology.  Anion gap metabolic acidosis: Likely due to azotemia/renal failure. -Manage renal failure as above  Hypervolemic hyponatremia-likely due to CHF or renal failure.  She is also on SSRI and lisinopril which could contribute.  TSH and a.m. cortisol normal.  Urine sodium 32.  -Cardiology ordered tolvaptan.  Anxiety and depression: Stable -Continue Zoloft and Xanax.  Hyperlipidemia -Fasting lipid panel in the morning -continue statins  GERD -continue protonix  Morbid obesity: Body mass index is 36.81 kg/m.  Patient with diabetes. -Encourage lifestyle change to lose weight -May benefit from newer agents with cardiovascular  benefits  At risk for polypharmacy-noted multiple sedating medications including Benadryl, chlorpheniramine -Encouraged to avoid OTC medications  Pulmonary nodules: Noted on high-resolution CT on 02/19/2019 -Follow-up CT in 12 months as recommended.  Liver cirrhosis?  Noted on high-resolution CT last months.  Debility/Generalized weakness: recently started using walker recently -PT/OT   Goal of care: DNR/DNI appropriately.  Multiple comorbidities as above.  Not a candidate for dialysis. -Consult palliative care               DVT prophylaxis: On Eliquis for A. fib Code Status: DNR/DNI Family Communication: Updated patient's husband over the phone on 3/10.  Discharge barrier: Evaluation and treatment of acute renal failure and CHF exacerbation.  On IV pressors and diuretics Patient is from: Home Final disposition: To be determined once medically stable and cleared by consultants.  Consultants: Cardiology, nephrology, PCCM, advanced HF team and palliative care   Microbiology summarized: COVID-19 screen negative.  Sch Meds:  Scheduled Meds: . apixaban  5 mg Oral BID  . atorvastatin  40 mg Oral Daily  . Chlorhexidine Gluconate Cloth  6 each Topical Daily  . darbepoetin (ARANESP) injection - NON-DIALYSIS  60 mcg Subcutaneous Q Wed-1800  . fluticasone furoate-vilanterol  1 puff Inhalation Daily  . insulin aspart  0-9 Units Subcutaneous TID WC  . insulin detemir  50 Units Subcutaneous QHS  . mouth rinse  15 mL Mouth Rinse BID  . pantoprazole  40 mg Oral Daily  . sertraline  100 mg Oral Daily  . sodium chloride flush  10-40 mL Intracatheter Q12H  . sodium chloride flush  3 mL Intravenous Q12H   Continuous Infusions: . sodium chloride    . sodium chloride    . amiodarone 30 mg/hr (03/20/19 2151)  . DOBUTamine 5 mcg/kg/min (03/21/19 0836)  . furosemide 120 mg (03/21/19 1024)   PRN Meds:.sodium chloride, sodium chloride, acetaminophen **OR** acetaminophen, albuterol,  ALPRAZolam, ipratropium-albuterol, sodium chloride flush, sodium chloride flush  Antimicrobials: Anti-infectives (From admission, onward)   None       I have personally reviewed the following labs and images: CBC: Recent Labs  Lab 03/17/19 2212 03/18/19 0505 03/20/19 1248 03/21/19 0630  WBC 10.8* 9.7 8.4 6.3  NEUTROABS 9.5*  --   --   --   HGB 9.4* 9.5* 9.5* 8.9*  HCT 30.3* 31.5* 28.8* 27.9*  MCV 97.4 99.1 93.2 94.3  PLT 176 192 168 154   BMP &GFR Recent Labs  Lab 03/17/19 2212 03/18/19 0505 03/19/19 0951 03/20/19 0410 03/21/19 0630  NA 128* 131* 126* 127* 126*  K 4.3 5.0 4.4 5.1 4.6  CL 93* 96* 92* 93* 93*  CO2 22 19* 20* 18* 20*  GLUCOSE 203* 197* 218* 141* 180*  BUN 44* 46* 59* 61* 66*  CREATININE 2.99* 2.83* 4.37* 4.27* 3.79*  CALCIUM 8.6* 8.6* 8.4* 8.7* 8.9  MG  --   --   --   --  1.8  PHOS  --   --   --   --  6.6*   Estimated Creatinine Clearance: 14 mL/min (A) (by C-G formula based on SCr of 3.79 mg/dL (H)). Liver & Pancreas: Recent Labs  Lab 03/17/19 2212 03/18/19 0505 03/21/19 0630  AST 28 30  --   ALT 20 18  --   ALKPHOS 63 62  --   BILITOT 0.6 0.6  --   PROT 7.2 7.1  --   ALBUMIN 3.8 3.7 3.1*   No results for input(s): LIPASE, AMYLASE in the last 168 hours. No results for input(s): AMMONIA in the last 168 hours. Diabetic: No results for input(s): HGBA1C in the last 72 hours. Recent Labs  Lab 03/20/19 1612 03/21/19 0305 03/21/19 0648 03/21/19 0810 03/21/19 1109  GLUCAP 232* 208* 163* 225* 227*   Cardiac Enzymes: No results for input(s): CKTOTAL, CKMB, CKMBINDEX, TROPONINI in the last 168 hours. Recent Labs    09/12/18 1201  PROBNP 51.0   Coagulation Profile: No results for input(s): INR, PROTIME in the last 168 hours. Thyroid Function Tests: No results for input(s): TSH, T4TOTAL, FREET4, T3FREE, THYROIDAB in the last 72 hours. Lipid Profile: Recent Labs    03/21/19 0605  CHOL 90  HDL 22*  LDLCALC 44  TRIG 118  CHOLHDL  4.1   Anemia Panel: Recent Labs    03/20/19 1248 03/21/19 0630  VITAMINB12 1,812*  --   FOLATE 32.3  --   FERRITIN 45 39  TIBC 522* 473*  IRON 71 45  RETICCTPCT 2.5  --    Urine analysis:    Component Value Date/Time   COLORURINE YELLOW 03/20/2019 2030   [place] 03/20/2019 2030   LABSPEC 1.010 03/20/2019 2030   PHURINE 5.0 03/20/2019 2030   GLUCOSEU NEGATIVE 03/20/2019  2030   [place] 03/20/2019 2030   [place] 03/20/2019 2030   [place] 03/20/2019 2030   PROTEINUR 30 (A) 03/20/2019 2030   NITRITE NEGATIVE 03/20/2019 2030   LEUKOCYTESUR SMALL (A) 03/20/2019 2030   Sepsis Labs: Invalid input(s): PROCALCITONIN, [place]  Microbiology: Recent Results (from the past 240 hour(s))  SARS CORONAVIRUS 2 (TAT 6-24 HRS) Nasopharyngeal Nasopharyngeal Swab     Status: None   Collection Time: 03/18/19 12:10 AM   Specimen: Nasopharyngeal Swab  Result Value Ref Range Status   SARS Coronavirus 2 NEGATIVE NEGATIVE Final    Comment: (NOTE) SARS-CoV-2 target nucleic acids are NOT DETECTED. The SARS-CoV-2 RNA is generally detectable in upper and lower respiratory specimens during the acute phase of infection. Negative results do not preclude SARS-CoV-2 infection, do not rule out co-infections with other pathogens, and should not be used as the sole basis for treatment or other patient management decisions. Negative results must be combined with clinical observations, patient history, and epidemiological information. The expected result is Negative. Fact Sheet for Patients: SugarRoll.be Fact Sheet for Healthcare Providers: https://www.woods-mathews.com/ This test is not yet approved or cleared by the [place] FDA and  has been authorized for detection and/or diagnosis of SARS-CoV-2 by FDA under an Emergency Use Authorization (EUA). This EUA will remain  in effect (meaning this test can be used)  for the duration of the COVID-19 declaration under Section 56 4(b)(1) of the Act, 21 U.S.C. section 360bbb-3(b)(1), unless the authorization is terminated or revoked sooner. Performed at [facility], [place] [address] [place], [place] [address]     Radiology Studies: DG CHEST PORT 1 VIEW  Result Date: 03/20/2019 CLINICAL DATA:  PICC line placement EXAM: PORTABLE CHEST 1 VIEW COMPARISON:  March 20, 2019 FINDINGS: The PICC line terminates in the region of the SVC. The heart size remains enlarged. There are small bilateral pleural effusions with adjacent airspace disease favored to represent atelectasis. There is no pneumothorax. No acute osseous abnormality. Aortic calcifications are noted. IMPRESSION: 1. Right-sided PICC line as above. 2. Again noted are findings suggestive of congestive heart failure. Electronically Signed   By: Constance Holster M.D.   On: 03/20/2019 20:15   DG Chest Port 1 View  Result Date: 03/20/2019 CLINICAL DATA:  Shortness of breath. EXAM: PORTABLE CHEST 1 VIEW COMPARISON:  Ultrasound 03/17/2019. FINDINGS: Cardiomegaly with bilateral pulmonary interstitial prominence and bilateral costophrenic angle blunting. Findings suggest congestive heart failure with interstitial edema and small bilateral pleural effusions. Pneumonitis cannot be excluded. Known pneumothorax. IMPRESSION: Findings suggest congestive heart failure bilateral interstitial edema small bilateral pleural effusions. Electronically Signed   By: Marcello Moores  Register   On: 03/20/2019 14:49   [place] EKG SITE RITE  Result Date: 03/20/2019 If Site Rite image not attached, placement could not be confirmed due to current cardiac rhythm.  45 minutes with more than 50% spent in reviewing records, counseling patient/family and coordinating care.  Kathlyn Leachman T. Gaylord  If 7PM-7AM, please contact night-coverage [email or web address] Password [facility] 03/21/2019, 11:14 AM

## 2019-03-21 NOTE — Progress Notes (Signed)
Occupational Therapy Treatment Patient Details Name: Ellen Jordan MRN: [id number] DOB: [birth year] Today's Date: 03/21/2019    History of present illness Ellen Jordan  is a 74 y.o. female, with history of chronic pain atrial fibrillation, on chronic anticoagulation with Eliquis, diabetes mellitus type 2, anxiety, hypertension, pulmonary hypertension, came to hospital with worsening shortness of breath.  Patient says that shortness of breath got worse over the past few days.  She also complains of rectal bleeding tonight that occurs occasionally when she has hard bowel movement.  Denies chest pain, abdominal pain or dysuria.  She is followed by pulmonology for COPD and medication induced interstitial lung disease. Pt transferred to [facility] on 3/9.   OT comments  Patient up in chair on arrival.  She was eating with set up.  Patient on 1 L of O2 and SpO2 100 at rest.  Patient's BP slightly low, 123/58 while seated and 116/68 after 1 min stand.  She was able to stand with min guard and RW and walk 10 ft in room.  She was complaining of lower leg aching/pain when standing.  Finished with 1 min of standing marches before patient became too fatigued.  SpO2 dropped to 93 with standing but quickly returned to 100 once seated.  Reviewed pursed lip breathing.  Will continue to follow with OT acutely to address the deficits listed below.    Follow Up Recommendations  SNF;Supervision - Intermittent    Equipment Recommendations  3 in 1 bedside commode    Recommendations for Other Services      Precautions / Restrictions Precautions Precautions: Fall Precaution Comments: LE wrapped       Mobility Bed Mobility               General bed mobility comments: up in recliner upon arrival  Transfers Overall transfer level: Needs assistance Equipment used: Rolling walker (2 wheeled) Transfers: Sit to/from Stand Sit to Stand: Min guard Stand pivot transfers: Min assist       General transfer  comment: using increased momentum from recliner    Balance                                           ADL either performed or assessed with clinical judgement   ADL Overall ADL's : Needs assistance/impaired Eating/Feeding: Set up;Sitting   Grooming: Set up;Sitting                               Functional mobility during ADLs: Min guard;Rolling walker       Vision       Perception     Praxis      Cognition Arousal/Alertness: Awake/alert Behavior During Therapy: WFL for tasks assessed/performed Overall Cognitive Status: Within Functional Limits for tasks assessed                                          Exercises Exercises: Other exercises Other Exercises Other Exercises: Pursed lip breathing Other Exercises: 1 min standing marches   Shoulder Instructions       General Comments On 1L O2 with 2 extensions.     Pertinent Vitals/ Pain       Pain Assessment: 0-10 Pain Score: 5  Pain Location:  Lower leg pain Pain Descriptors / Indicators: Discomfort;Aching Pain Intervention(s): Limited activity within patient's tolerance;Monitored during session  Home Living                                          Prior Functioning/Environment              Frequency  Min 2X/week        Progress Toward Goals  OT Goals(current goals can now be found in the care plan section)  Progress towards OT goals: Progressing toward goals  Acute Rehab OT Goals Patient Stated Goal: return home and not go to rehab OT Goal Formulation: With patient Time For Goal Achievement: 04/03/19 Potential to Achieve Goals: Good  Plan Discharge plan remains appropriate    Co-evaluation                 AM-PAC OT "6 Clicks" Daily Activity     Outcome Measure   Help from another person eating meals?: None Help from another person taking care of personal grooming?: A Little Help from another person toileting, which  includes using toliet, bedpan, or urinal?: A Little Help from another person bathing (including washing, rinsing, drying)?: A Little Help from another person to put on and taking off regular upper body clothing?: A Little Help from another person to put on and taking off regular lower body clothing?: A Lot 6 Click Score: 18    End of Session Equipment Utilized During Treatment: Rolling walker;Oxygen  OT Visit Diagnosis: Unsteadiness on feet (R26.81);Muscle weakness (generalized) (M62.81)   Activity Tolerance Patient limited by fatigue;Patient limited by pain   Patient Left in chair;with call bell/phone within reach   Nurse Communication Mobility status        Time: 1208-1232 OT Time Calculation (min): 24 min  Charges: OT General Charges $OT Visit: 1 Visit OT Treatments $Self Care/Home Management : 8-22 mins $Therapeutic Activity: 8-22 mins August Luz, OTR/L    Phylliss Bob 03/21/2019, 12:59 PM

## 2019-03-21 NOTE — Progress Notes (Addendum)
Advanced Heart Failure Rounding Note  PCP-Cardiologist: Rozann Lesches, MD   Subjective:    Yesterday dobutamine 3 mcg was started for low output. Diuresed with IV lasix. I/O not accurate.   Initial CO-OX 35%. Todays CO-OX is 42%.   SOB with exertion.    Objective:   Weight Range: 94.3 kg Body mass index is 36.81 kg/m.   Vital Signs:   Temp:  [97.3 F (36.3 C)-98.1 F (36.7 C)] 97.3 F (36.3 C) (03/11 0130) Pulse Rate:  [41-156] 79 (03/11 0610) Resp:  [16-20] 17 (03/11 0610) BP: (89-166)/(53-144) 112/53 (03/11 0610) SpO2:  [88 %-100 %] 99 % (03/11 0610) Weight:  [94.3 kg] 94.3 kg (03/11 0604) Last BM Date: 03/20/19  Weight change: Filed Weights   03/19/19 1854 03/20/19 0431 03/21/19 0604  Weight: 93.9 kg 93.3 kg 94.3 kg    Intake/Output:   Intake/Output Summary (Last 24 hours) at 03/21/2019 0740 Last data filed at 03/21/2019 0555 Gross per 24 hour  Intake 590.03 ml  Output 701 ml  Net -110.97 ml      Physical Exam   CVP 31 General:  Sitting on the side of the bed.  No resp difficulty HEENT: Normal Neck: Supple. JVP .to ear.  Carotids 2+ bilat; no bruits. No lymphadenopathy or thyromegaly appreciated. Cor: PMI nondisplaced. Regular rate & rhythm. No rubs, gallops.  2/6  SEM RUSB. Lungs: Clear on 2 liters  Abdomen: Soft, nontender, nondistended. No hepatosplenomegaly. No bruits or masses. Good bowel sounds. Extremities: No cyanosis, clubbing, rash, R andLLE unna boots. . 2+ edema above the wraps. RUE PICC Neuro: Alert & orientedx3, cranial nerves grossly intact. moves all 4 extremities w/o difficulty. Affect pleasant   Telemetry  A flutter 70-80s   EKG   n/a   Labs    CBC Recent Labs    03/20/19 1248 03/21/19 0630  WBC 8.4 6.3  HGB 9.5* 8.9*  HCT 28.8* 27.9*  MCV 93.2 94.3  PLT 168 903   Basic Metabolic Panel Recent Labs    03/20/19 0410 03/21/19 0630  NA 127* 126*  K 5.1 4.6  CL 93* 93*  CO2 18* 20*  GLUCOSE 141* 180*  BUN  61* 66*  CREATININE 4.27* 3.79*  CALCIUM 8.7* 8.9  MG  --  1.8  PHOS  --  6.6*   Liver Function Tests Recent Labs    03/21/19 0630  ALBUMIN 3.1*   No results for input(s): LIPASE, AMYLASE in the last 72 hours. Cardiac Enzymes No results for input(s): CKTOTAL, CKMB, CKMBINDEX, TROPONINI in the last 72 hours.  BNP: BNP (last 3 results) Recent Labs    03/17/19 2213  BNP 338.0*    ProBNP (last 3 results) Recent Labs    09/12/18 1201  PROBNP 51.0     D-Dimer No results for input(s): DDIMER in the last 72 hours. Hemoglobin A1C No results for input(s): HGBA1C in the last 72 hours. Fasting Lipid Panel No results for input(s): CHOL, HDL, LDLCALC, TRIG, CHOLHDL, LDLDIRECT in the last 72 hours. Thyroid Function Tests No results for input(s): TSH, T4TOTAL, T3FREE, THYROIDAB in the last 72 hours.  Invalid input(s): FREET3  Other results:   Imaging    US RENAL  Result Date: 03/20/2019 CLINICAL DATA:  Acute kidney injury EXAM: RENAL / URINARY TRACT ULTRASOUND COMPLETE COMPARISON:  None. FINDINGS: Right Kidney: Renal measurements: 12.5 x 4.9 x 6.5 cm = volume: 209 mL. Cortical thinning. Normal echotexture. No mass or hydronephrosis. Left Kidney: Renal measurements: 12.0 x 5.6  x 6.5 cm = volume: 228 mL. Cortical thinning. Normal echotexture. No mass or hydronephrosis. Bladder: Not visualized, decompressed Other: None. IMPRESSION: Cortical thinning bilaterally. No acute findings. No hydronephrosis. Electronically Signed   By: Rolm Baptise M.D.   On: 03/20/2019 10:12   DG CHEST PORT 1 VIEW  Result Date: 03/20/2019 CLINICAL DATA:  PICC line placement EXAM: PORTABLE CHEST 1 VIEW COMPARISON:  March 20, 2019 FINDINGS: The PICC line terminates in the region of the SVC. The heart size remains enlarged. There are small bilateral pleural effusions with adjacent airspace disease favored to represent atelectasis. There is no pneumothorax. No acute osseous abnormality. Aortic calcifications  are noted. IMPRESSION: 1. Right-sided PICC line as above. 2. Again noted are findings suggestive of congestive heart failure. Electronically Signed   By: Constance Holster M.D.   On: 03/20/2019 20:15   DG Chest Port 1 View  Result Date: 03/20/2019 CLINICAL DATA:  Shortness of breath. EXAM: PORTABLE CHEST 1 VIEW COMPARISON:  Ultrasound 03/17/2019. FINDINGS: Cardiomegaly with bilateral pulmonary interstitial prominence and bilateral costophrenic angle blunting. Findings suggest congestive heart failure with interstitial edema and small bilateral pleural effusions. Pneumonitis cannot be excluded. Known pneumothorax. IMPRESSION: Findings suggest congestive heart failure bilateral interstitial edema small bilateral pleural effusions. Electronically Signed   By: Marcello Moores  Register   On: 03/20/2019 14:49   [place] EKG SITE RITE  Result Date: 03/20/2019 If Site Rite image not attached, placement could not be confirmed due to current cardiac rhythm.     Medications:     Scheduled Medications: . apixaban  5 mg Oral BID  . atorvastatin  40 mg Oral Daily  . Chlorhexidine Gluconate Cloth  6 each Topical Daily  . darbepoetin (ARANESP) injection - NON-DIALYSIS  60 mcg Subcutaneous Q Wed-1800  . fluticasone furoate-vilanterol  1 puff Inhalation Daily  . insulin aspart  0-9 Units Subcutaneous TID WC  . insulin detemir  50 Units Subcutaneous QHS  . mouth rinse  15 mL Mouth Rinse BID  . pantoprazole  40 mg Oral Daily  . sertraline  100 mg Oral Daily  . sodium chloride flush  10-40 mL Intracatheter Q12H  . sodium chloride flush  3 mL Intravenous Q12H     Infusions: . sodium chloride    . amiodarone 30 mg/hr (03/20/19 2151)  . DOBUTamine 3 mcg/kg/min (03/20/19 2151)  . furosemide 120 mg (03/20/19 2326)     PRN Medications:  sodium chloride, acetaminophen **OR** acetaminophen, albuterol, ALPRAZolam, ipratropium-albuterol, sodium chloride flush, sodium chloride flush   Assessment/Plan   1. RV  failure: Echo this admission shows LV EF 65-70% with RV moderate-severely dilated and moderate dysfunction, D-shaped septum, the mitral valve is severely calcified though mean gradient is only about 5 mmHg suggesting mild to moderate mitral stenosis (pressure half-time does not appear short), mild AS.  The patient is markedly volume overloaded.  I am concerned that she has end-stage RV dysfunction from pulmonary hypertension.  She also has mitral stenosis which visually looks significant but measures only mild-moderate by mean gradient and pressure half-time.  She has been oliguric with creatinine up to 4.27 => down to 3.79 today.  - We are trying inotropes empirically to see if we can improve RV function enough to achieve diuresis.  Given her markedly low GFR and soft BP at times,  She was started on dobutamine 3 mcg/kg/min.   PICC placed. CO-OX 42% today. Increase dobutamine to 5 mcg. Creatinine trending down - She would be a poor dialysis candidate renal has  ok'd PICC placement.   - CVP 31. Increase lasix to 120 mg IV three times a day. Give dose of tolvaptan.   2. Pulmonary hypertension: PASP estimated at 41 mmHg on echo this admission and 42 mmHg on echo in 2/20.  However, there has been suspicion for severe pulmonary hypertension based on dilated and dysfunctional RV.  There was concern for group 3 PH but recent high resolution chest CT in 2/21 does not show marked lung abnormality (there does not seem to be severe emphysema or interstitial lung disease). It is possible that she has had group 1 PH (or group 2 PH if mitral stenosis is worse than it appears by echo doppler), but will need RHC for formal diagnosis.  - She will need eventual formal RHC, cannot lie down yet.  3. Mitral stenosis: The valve looks severely calcified, but hemodynamics by echo are not that impressive.  Mean gradient 5 mmHg and MVA 2.66 cm^2 by PHT suggest no more than mild mitral stenosis.   - Would like to confirm severity of  MS when cath is done (would require right and left heart cath to measure MV gradient invasively).  - If she has moderate to severe MS, she would not be a good candidate for valve replacement.  4. Atrial flutter: The patient appears to have been in atrial flutter since at least 12/20.  Mild RVR.   - Off atenolol with AKI.  - Control HR with amiodarone gtt while on inotrope.  - Consider eventual DCCV, but would need to be after she titrates off dobutamine as she would be less likely to hold NSR on dobutamine.  - Continue apixaban for now, will hold prior to cath (probably Monday at this point).  5. Acute on CKD stage 3: Creatinine peaked at 4.3. Todays creatinine is down to  3.8. Suspect cardiorenal syndrome in setting of RV failure.  It is possible that she could improve with effective diuresis and lowering of renal venous pressure.  She would be a poor candidate for long-term HD with RV failure.  6. Chronic hypoxemic respiratory failure:  She has a history of smoking, but high resolution CT lungs does not show clear evidence of severe COPD or of interstitial lung disease.  It is possible that she has group 1 pulmonary HTN as cause of oxygen requirement.  7. Hyponatremia Sodium 126. Give dose of tolvaptan 15 mg daily.   Length of Stay: 3  Amy Clegg, NP  03/21/2019, 7:40 AM  Advanced Heart Failure Team Pager [phone] (M-F; 7a - 4p)  Please contact [facility] for night-coverage after hours (4p -7a ) and weekends on [email or web address]  Patient seen with NP, agree with the above note.   Marked volume overload/RV failure, CVP in low 30s.  Co-ox low at 42% on dobutamine 3.  She is still markedly orthopneic.  HR controlled on amiodarone gtt.   General: NAD Neck: JVP 16+, no thyromegaly or thyroid nodule.  Lungs: Crackles at bases.  CV: Nondisplaced PMI.  Heart irregular S1/S2, no S3/S4, no murmur.  2+ edema to thighs.   Abdomen: Soft, nontender, no hepatosplenomegaly, no distention.  Skin: Intact  without lesions or rashes.  Neurologic: Alert and oriented x 3.  Psych: Normal affect. Extremities: No clubbing or cyanosis.  HEENT: Normal.   Picture looks like severe (end-stage) RV failure with CVP in low 30s.  Co-ox remains low on dobutamine 3.  Suspect either group 1 PH (lungs have not looked that bad on CT) versus possible  severe mitral stenosis (though based on echo doppler this seems unlikely).  Renal function is marginally better today on inotrope.  - Increase dobutamine to 5 mcg/kg/min.  - Continue amiodarone for rate control, may eventually plan DCCV.  - Lasix 120 mg IV every 8 hrs today.  - With hyponatremia, will give dose of tolvaptan 15 mg po x 1.  - RHC when able to lie more flat, help to differentiate PAH from pulmonary venous hypertension from elevated LA pressure. Possibly Monday.  - Poor HD candidate with RV dysfunction, hopefully we will continue to see improvement but I am concerned for end-stage RV failure.    [name] 03/21/2019 8:28 AM

## 2019-03-21 NOTE — Progress Notes (Signed)
NAME:  Ellen Jordan, MRN:  [id number], DOB:  1945/10/14, LOS: 3 ADMISSION DATE:  03/17/2019, CONSULTATION DATE: 3/10 REFERRING MD:  Cyndia Skeeters, CHIEF COMPLAINT:  Dyspnea    Brief History   74 year old female w/ chronic respiratory failure admitted on 3/7 w/ weakness, exertional dyspnea and rapid HR. (of note her dyspnea has been chronic in excess of a year) Found to have AKI (in context of recent increase in diuretic). Admitted. Renal fxn worse in spite of IVFs and holding diuretics. Still having mild tachycardia. PCCM asked to see re: to what extent pulmonary contributing      Past Medical History  COPD,  ILD but not confirmed radiographically, chronic respiratory failure, PH, afib on DOAC, diastolic HF, CKD 3-B, obesity, HTN Last seen 2/4 by MR:  Significant Hospital Events   3/7 admitted w/ dyspnea af w/ RVR  and AKI. Diuretic held. IVF started. Cards consulted. Flecainide stopped d/t wide QRS->still on 2 liters. PCXR CM, vascular congestion 3/9 BP 90s, diuretics held, antihypertensives held. UOP decreased 3/10 scr up from 2.83 to 4.27; PCCM asked to see  3/11 UOP and renal fxn better w/ inotrope and lasix Consults:  Cards pulm  Procedures:    Significant Diagnostic Tests:  3/9 ECHO LVEF 60-65% RV severely dilated w/ decreased contractility. Mild to mod M CT chest 2/19 1. Borderline mild cardiomegaly. Trace dependent right pleural effusion. Interlobular septal thickening throughout both lungs, most prominent in the lower lungs, unchanged. Findings may indicate a component of mild congestive heart failure with mild pulmonary edema. 2. Mild mosaic attenuation in both lungs, which appears to represent mild air trapping due to small airways disease. No compelling findings of interstitial lung disease. Thick parenchymal bands in the mid lungs suggest nonspecific postinfectious/postinflammatory scarring or atelectasis. 3. Scattered solid pulmonary nodules, largest 8 mm in the apical right  upper lobe, for which 7 month stability has been demonstrated, more likely benign. Suggest follow-up chest CT in 12 months. 4. Moderate mediastinal and mild bilateral hilar adenopathy is stable and more suggestive of benign etiology, with sarcoidosis not Excluded. 5. Three-vessel coronary atherosclerosis. 6. Cirrhosis. Trace perihepatic ascites. 7. Small hiatal hernia. 8. Aortic Atherosclerosis (ICD10-I70.0).    Antimicrobials:   Interim history/subjective:  Feels better  Objective   Blood pressure (Abnormal) 107/54, pulse 78, temperature (Abnormal) 97.4 F (36.3 C), temperature source Oral, resp. rate 16, height _0  (1.6 m), weight 94.3 kg, SpO2 94 %. CVP:  [30 mmHg-36 mmHg] 30 mmHg      Intake/Output Summary (Last 24 hours) at 03/21/2019 1224 Last data filed at 03/21/2019 1142 Gross per 24 hour  Intake 230.03 ml  Output 851 ml  Net -620.97 ml   Filed Weights   03/19/19 1854 03/20/19 0431 03/21/19 0604  Weight: 93.9 kg 93.3 kg 94.3 kg    Examination: General 74 year old WF up in chair. No distress. Feels better HENT NCAT no JVD MMM Pulm crackles bases no accessory use 1 lpm [place] Card RRR  abd not tender Ext warm and dry LE wrapped Neuro intact  Resolved Hospital Problem list     Assessment & Plan:   Acute on chronic respiratory failure Acute right heart failure/decompensated Cor Pulmonale  afib w/ RVR COPD->not in AECOPD Acute on chronic renal failure w/ cardiorenal syndrome Diastolic dysfn DM Volume overload Poor activity tolerance    Acute on chronic hypoxic respiratory failure. Multi-factorial: decompensated Cor Pulmonale, secondary PH, underlying COPD. Suspect over diuresis followed by AF w/ RVR were  the contributing factors to her admission -she does not seem to be in AECOPD< no radiographical evidence of IPF flare and CT earlier in Feb really didn't seem c/w IPF, would favor at this point her [place] and RV dysfxn being the major contributing factor -was  on eliquis so doubt PE -think she likely is hypoxic much more chronically than we are giving her credit for which would be a large contributing factor to why she is here BUT simply treating her hypoxia won't fix her RV dysfxn -her co-ox was 35% prior to dobutamine. Her scr has improved w/ inotrope and lasix  Plan/rec Cont IV diuretics Inotropic support per heart failure team  Supplemental oxygen Need to record any and all desaturation episodes in progress note w/ activity and sleep Will need to be sure we do walking oximetry prior to dc Will need to be followed in HF clinic Control AF Cont home BDs  We will be available as needed.  Can follow up with [place] in office-->3-4 weeks from today w/ NP  Erick Colace ACNP-BC [place] Pager # [phone] OR # [phone] if no answer

## 2019-03-21 NOTE — Progress Notes (Signed)
Physical Therapy Treatment Patient Details Name: Ellen Jordan MRN: [id number] DOB: March 23, 1945 Today's Date: 03/21/2019    History of Present Illness Ellen Jordan  is a 74 y.o. female, with history of chronic pain atrial fibrillation, on chronic anticoagulation with Eliquis, diabetes mellitus type 2, anxiety, hypertension, pulmonary hypertension, came to hospital with worsening shortness of breath.  Patient says that shortness of breath got worse over the past few days.  She also complains of rectal bleeding tonight that occurs occasionally when she has hard bowel movement.  Denies chest pain, abdominal pain or dysuria.  She is followed by pulmonology for COPD and medication induced interstitial lung disease. [facility] transferred to [facility] on 3/9.    PT Comments    Pt showing limited progress today due to fatigue/not feeling well.  Pt was only able to ambulate to and from [facility] with min A and min cues for safety.  Did have slight drop in BP (see below) with activity, but symptoms improved in reclined position.  Will cont POC and to progress as able.     Follow Up Recommendations  SNF     Equipment Recommendations       Recommendations for Other Services       Precautions / Restrictions Precautions Precautions: Fall    Mobility  Bed Mobility               General bed mobility comments: up in recliner upon arrival  Transfers Overall transfer level: Needs assistance Equipment used: Rolling walker (2 wheeled) Transfers: Sit to/from Stand Sit to Stand: Min guard         General transfer comment: performed x 1 from recliner and x 2 from [facility]; required use of momentum and cues for hand placement  Ambulation/Gait Ambulation/Gait assistance: Min guard Gait Distance (Feet): 8 Feet(x2) Assistive device: Rolling walker (2 wheeled) Gait Pattern/deviations: Wide base of support;Decreased stride length;Shuffle Gait velocity: reduced   General Gait Details: reports not feeling well; only  able to ambulate to bsc and back; cues for RW and assist with line management   Stairs             Wheelchair Mobility    Modified Rankin (Stroke Patients Only)       Balance Overall balance assessment: Needs assistance Sitting-balance support: No upper extremity supported;Feet supported Sitting balance-Leahy Scale: Good     Standing balance support: Bilateral upper extremity supported Standing balance-Leahy Scale: Fair                              Cognition Arousal/Alertness: Awake/alert Behavior During Therapy: WFL for tasks assessed/performed Overall Cognitive Status: Within Functional Limits for tasks assessed                                        Exercises      General Comments General comments (skin integrity, edema, etc.): on 1 L O2 with stable sats; BP was 117/64 in sitting and down to 104/66 with transfer to toilet.  Pt reports feeling "washed out".  Reports feels better once reclined.      Pertinent Vitals/Pain Pain Assessment: Faces Faces Pain Scale: Hurts a little bit Pain Location: stomach Pain Descriptors / Indicators: Cramping Pain Intervention(s): Limited activity within patient's tolerance;Monitored during session;Repositioned    Home Living  Prior Function            PT Goals (current goals can now be found in the care plan section) Progress towards PT goals: Not progressing toward goals - comment(pt fatigued today)    Frequency    Min 3X/week      PT Plan Current plan remains appropriate    Co-evaluation              AM-PAC PT "6 Clicks" Mobility   Outcome Measure  Help needed turning from your back to your side while in a flat bed without using bedrails?: None Help needed moving from lying on your back to sitting on the side of a flat bed without using bedrails?: A Little Help needed moving to and from a bed to a chair (including a wheelchair)?: A Little Help  needed standing up from a chair using your arms (e.g., wheelchair or bedside chair)?: A Little Help needed to walk in hospital room?: A Little Help needed climbing 3-5 steps with a railing? : A Lot 6 Click Score: 18    End of Session Equipment Utilized During Treatment: Oxygen Activity Tolerance: Patient limited by fatigue Patient left: in chair;with call bell/phone within reach   PT Visit Diagnosis: Unsteadiness on feet (R26.81);Other abnormalities of gait and mobility (R26.89);Muscle weakness (generalized) (M62.81)     Time: 8675-1982 PT Time Calculation (min) (ACUTE ONLY): 19 min  Charges:  $Therapeutic Activity: 8-22 mins                     Maggie Font, PT Acute Rehab Services Pager [phone] [facility] [phone] [facility] [phone]    Ellen Jordan 03/21/2019, 4:43 PM

## 2019-03-21 NOTE — Progress Notes (Signed)
Subjective:  700 of UOP recorded but may have missed a shift.  BP seeming higher.  Appreciate advanced heart failure team-  Started on dobutamine-  Lasix at 120 tid- crt actually a little lower.  She is not feeling any better yet  Objective Vital signs in last 24 hours: Vitals:   03/21/19 0300 03/21/19 0330 03/21/19 0604 03/21/19 0610  BP: 101/66 115/66  (!) 112/53  Pulse: 81 93  79  Resp:    17  Temp:      TempSrc:      SpO2:    99%  Weight:   94.3 kg   Height:       Weight change: 0.357 kg  Intake/Output Summary (Last 24 hours) at 03/21/2019 0803 Last data filed at 03/21/2019 0555 Gross per 24 hour  Intake 350.03 ml  Output 701 ml  Net -350.97 ml    Assessment/Plan: 74 year old white female with multiple medical issues.  Creatinine 1.07 in December 2020.  Has developed a change in renal function in association with symptoms consistent with decompensated right-sided heart failure.  Kidney function worsened in the setting of hospitalization, management of atrial fibrillation with hypotension 1.Renal-acute on chronic renal failure in the setting of above.  Renal ultrasound does not show obstruction but does show some chronicity of disease. U/A with 30 of protein and no cells so bland.   Therefore, I suspect all of the kidney injury is hemodynamic in nature, mostly associated with blood pressure in the 80s.  She was on an ACE inhibitor prior to admission that has been held.  Fortunately, she is currently nonoliguric and renal function is stable to improved the last 24 hours.  There are no acute indications for dialysis at this time.  As a side note, given her multiple medical issues and her cardiac function I do not think that she would do well on dialysis and would probably advise against it 2. Hypertension/volume  -she is overloaded.  Blood pressure is soft.   atenolol 50 has been stopped per cards.   I think that her right-sided heart failure is the key here.  I am not sure how much  reversibility there is to her cardiac dynamics.  Appreciate cardiology input.   Needs diuresis.  Concerned about dropping blood pressure. Cardiology has now placed on 120 q 8 along with metolazone-  Not that robust of a response it seems as of yet but BP is holding 3. Anemia  -she is anemic which is not helping.   iron stores low- will replete, give first dose of feraheme today-  and have started ESA    Louis Meckel    Labs: Basic Metabolic Panel: Recent Labs  Lab 03/19/19 0951 03/20/19 0410 03/21/19 0630  NA 126* 127* 126*  K 4.4 5.1 4.6  CL 92* 93* 93*  CO2 20* 18* 20*  GLUCOSE 218* 141* 180*  BUN 59* 61* 66*  CREATININE 4.37* 4.27* 3.79*  CALCIUM 8.4* 8.7* 8.9  PHOS  --   --  6.6*   Liver Function Tests: Recent Labs  Lab 03/17/19 2212 03/18/19 0505 03/21/19 0630  AST 28 30  --   ALT 20 18  --   ALKPHOS 63 62  --   BILITOT 0.6 0.6  --   PROT 7.2 7.1  --   ALBUMIN 3.8 3.7 3.1*   No results for input(s): LIPASE, AMYLASE in the last 168 hours. No results for input(s): AMMONIA in the last 168 hours. CBC: Recent Labs  Lab 03/17/19 2212 03/17/19 2212 03/18/19 0505 03/20/19 1248 03/21/19 0630  WBC 10.8*   < > 9.7 8.4 6.3  NEUTROABS 9.5*  --   --   --   --   HGB 9.4*   < > 9.5* 9.5* 8.9*  HCT 30.3*   < > 31.5* 28.8* 27.9*  MCV 97.4  --  99.1 93.2 94.3  PLT 176   < > 192 168 154   < > = values in this interval not displayed.   Cardiac Enzymes: No results for input(s): CKTOTAL, CKMB, CKMBINDEX, TROPONINI in the last 168 hours. CBG: Recent Labs  Lab 03/20/19 0627 03/20/19 1117 03/20/19 1612 03/21/19 0305 03/21/19 0648  GLUCAP 156* 195* 232* 208* 163*    Iron Studies:  Recent Labs    03/20/19 1248  IRON 71  TIBC 522*  FERRITIN 45   Studies/Results: US RENAL  Result Date: 03/20/2019 CLINICAL DATA:  Acute kidney injury EXAM: RENAL / URINARY TRACT ULTRASOUND COMPLETE COMPARISON:  None. FINDINGS: Right Kidney: Renal measurements: 12.5 x 4.9  x 6.5 cm = volume: 209 mL. Cortical thinning. Normal echotexture. No mass or hydronephrosis. Left Kidney: Renal measurements: 12.0 x 5.6 x 6.5 cm = volume: 228 mL. Cortical thinning. Normal echotexture. No mass or hydronephrosis. Bladder: Not visualized, decompressed Other: None. IMPRESSION: Cortical thinning bilaterally. No acute findings. No hydronephrosis. Electronically Signed   By: Rolm Baptise M.D.   On: 03/20/2019 10:12   DG CHEST PORT 1 VIEW  Result Date: 03/20/2019 CLINICAL DATA:  PICC line placement EXAM: PORTABLE CHEST 1 VIEW COMPARISON:  March 20, 2019 FINDINGS: The PICC line terminates in the region of the SVC. The heart size remains enlarged. There are small bilateral pleural effusions with adjacent airspace disease favored to represent atelectasis. There is no pneumothorax. No acute osseous abnormality. Aortic calcifications are noted. IMPRESSION: 1. Right-sided PICC line as above. 2. Again noted are findings suggestive of congestive heart failure. Electronically Signed   By: Constance Holster M.D.   On: 03/20/2019 20:15   DG Chest Port 1 View  Result Date: 03/20/2019 CLINICAL DATA:  Shortness of breath. EXAM: PORTABLE CHEST 1 VIEW COMPARISON:  Ultrasound 03/17/2019. FINDINGS: Cardiomegaly with bilateral pulmonary interstitial prominence and bilateral costophrenic angle blunting. Findings suggest congestive heart failure with interstitial edema and small bilateral pleural effusions. Pneumonitis cannot be excluded. Known pneumothorax. IMPRESSION: Findings suggest congestive heart failure bilateral interstitial edema small bilateral pleural effusions. Electronically Signed   By: Marcello Moores  Register   On: 03/20/2019 14:49   ECHOCARDIOGRAM COMPLETE  Result Date: 03/19/2019    ECHOCARDIOGRAM REPORT   Patient Name:   Ellen Jordan Date of Exam: 03/19/2019 Medical Rec #:  [id number]     Height:       63.0 in Accession #:    [id number]    Weight:       175.0 lb Date of Birth:  August 26, 1945    BSA:           1.827 m Patient Age:    74 years      BP:           90/61 mmHg Patient Gender: F             HR:           92 bpm. Exam Location:  [facility] Procedure: 2D Echo Indications:    Atrial Flutter 427.32 / I48.92  History:        Patient has prior history of Echocardiogram examinations, most  recent 02/23/2018. Arrythmias:Atrial Fibrillation and RBBB; Risk                 Factors:Hypertension and Diabetes. Acute kidney injury.  Sonographer:    Vikki Ports Turrentine Referring Phys: [id number] [place]  1. Left ventricular ejection fraction, by estimation, is 60 to 65%. The left ventricle has normal function. The left ventricle has no regional wall motion abnormalities. Left ventricular diastolic parameters are indeterminate. There is the interventricular septum is flattened in systole and diastole, consistent with right ventricular pressure and volume overload.  2. Right ventricular systolic function is mildly reduced. The right ventricular size is moderately to severely enlarged. There is moderately elevated pulmonary artery systolic pressure. The estimated right ventricular systolic pressure is 34.1 mmHg.  3. Left atrial size was mildly dilated.  4. Right atrial size was moderately dilated.  5. Mitral leaflets show significantly decreased excursion, although mean gradient suggests mild mitral stenosis.. The mitral valve is degenerative, severely calcified and associated with severe annular calcification. Trivial mitral valve regurgitation.  6. Tricuspid valve regurgitation is moderate.  7. The aortic valve is tricuspid. Aortic valve regurgitation is not visualized. Mild aortic valve stenosis. Aortic valve mean gradient measures 5.7 mmHg. Aortic valve Vmax measures 1.44 m/s.  8. The inferior vena cava is dilated in size with <50% respiratory variability, suggesting right atrial pressure of 15 mmHg. FINDINGS  Left Ventricle: Left ventricular ejection fraction, by estimation, is 60 to 65%.  The left ventricle has normal function. The left ventricle has no regional wall motion abnormalities. The left ventricular internal cavity size was normal in size. There is  no left ventricular hypertrophy. The interventricular septum is flattened in systole and diastole, consistent with right ventricular pressure and volume overload. Left ventricular diastolic parameters are indeterminate. Right Ventricle: The right ventricular size is severely enlarged. No increase in right ventricular wall thickness. Right ventricular systolic function is mildly reduced. There is moderately elevated pulmonary artery systolic pressure. The tricuspid regurgitant velocity is 2.55 m/s, and with an assumed right atrial pressure of 15 mmHg, the estimated right ventricular systolic pressure is 96.2 mmHg. Left Atrium: Left atrial size was mildly dilated. Right Atrium: Right atrial size was moderately dilated. Pericardium: Trivial pericardial effusion is present. The pericardial effusion is posterior to the left ventricle. Mitral Valve: Mitral leaflets show significantly decreased excursion, although mean gradient suggests mild mitral stenosis. The mitral valve is degenerative in appearance. There is severe calcification of the mitral valve leaflet(s). Severe mitral annular calcification. Trivial mitral valve regurgitation. MV peak gradient, 15.3 mmHg. The mean mitral valve gradient is 4.5 mmHg. Tricuspid Valve: The tricuspid valve is grossly normal. Tricuspid valve regurgitation is moderate. Aortic Valve: The aortic valve is tricuspid. Aortic valve regurgitation is not visualized. Mild aortic stenosis is present. Mild to moderate aortic valve annular calcification. Aortic valve mean gradient measures 5.7 mmHg. Aortic valve peak gradient measures 8.3 mmHg. Aortic valve area, by VTI measures 1.10 cm. Pulmonic Valve: The pulmonic valve was grossly normal. Pulmonic valve regurgitation is trivial. Aorta: The aortic root is normal in size and  structure. Venous: The inferior vena cava is dilated in size with less than 50% respiratory variability, suggesting right atrial pressure of 15 mmHg. IAS/Shunts: No atrial level shunt detected by color flow Doppler.  LEFT VENTRICLE PLAX 2D LVIDd:         3.75 cm LVIDs:         2.49 cm LV PW:         0.92  cm LV IVS:        0.95 cm LVOT diam:     1.60 cm LV SV:         33 LV SV Index:   18 LVOT Area:     2.01 cm  RIGHT VENTRICLE TAPSE (M-mode): 1.8 cm LEFT ATRIUM             Index       RIGHT ATRIUM           Index LA diam:        4.80 cm 2.63 cm/m  RA Area:     25.60 cm LA Vol (A2C):   98.6 ml 53.97 ml/m RA Volume:   90.00 ml  49.26 ml/m LA Vol (A4C):   66.7 ml 36.51 ml/m LA Biplane Vol: 82.1 ml 44.94 ml/m  AORTIC VALVE AV Area (Vmax):    1.05 cm AV Area (Vmean):   0.93 cm AV Area (VTI):     1.10 cm AV Vmax:           144.33 cm/s AV Vmean:          113.667 cm/s AV VTI:            0.297 m AV Peak Grad:      8.3 mmHg AV Mean Grad:      5.7 mmHg LVOT Vmax:         75.50 cm/s LVOT Vmean:        52.800 cm/s LVOT VTI:          0.163 m LVOT/AV VTI ratio: 0.55  AORTA Ao Root diam: 2.60 cm MITRAL VALVE                TRICUSPID VALVE MV Area (PHT): 2.87 cm     TR Peak grad:   26.0 mmHg MV Peak grad:  15.3 mmHg    TR Vmax:        255.00 cm/s MV Mean grad:  4.5 mmHg MV Vmax:       1.96 m/s     SHUNTS MV Vmean:      84.4 cm/s    Systemic VTI:  0.16 m MV Decel Time: 264 msec     Systemic Diam: 1.60 cm MV E velocity: 174.00 cm/s Rozann Lesches MD Electronically signed by Rozann Lesches MD Signature Date/Time: 03/19/2019/12:40:07 PM    Final    [place] EKG SITE RITE  Result Date: 03/20/2019 If Site Rite image not attached, placement could not be confirmed due to current cardiac rhythm.  Medications: Infusions: . sodium chloride    . amiodarone 30 mg/hr (03/20/19 2151)  . DOBUTamine 3 mcg/kg/min (03/20/19 2151)  . furosemide      Scheduled Medications: . apixaban  5 mg Oral BID  . atorvastatin  40 mg Oral Daily   . Chlorhexidine Gluconate Cloth  6 each Topical Daily  . darbepoetin (ARANESP) injection - NON-DIALYSIS  60 mcg Subcutaneous Q Wed-1800  . fluticasone furoate-vilanterol  1 puff Inhalation Daily  . insulin aspart  0-9 Units Subcutaneous TID WC  . insulin detemir  50 Units Subcutaneous QHS  . mouth rinse  15 mL Mouth Rinse BID  . pantoprazole  40 mg Oral Daily  . sertraline  100 mg Oral Daily  . sodium chloride flush  10-40 mL Intracatheter Q12H  . sodium chloride flush  3 mL Intravenous Q12H    have reviewed scheduled and prn medications.  Physical Exam: General: sitting up-  Did eat breakfast-  Still feels  lousy Heart: RRR Lungs: really pretty clear-  Poor expansion Abdomen: soft, non tender Extremities: pitting edema    03/21/2019,8:03 AM  LOS: 3 days

## 2019-03-21 NOTE — Progress Notes (Signed)
Chaplain is working to [employer] and witnesses for Ellen Jordan Advanced Directive.  Chaplain has been having trouble securing witnesses.  Chaplain will continue to follow-up to complete paperwork.

## 2019-03-21 NOTE — Consult Note (Addendum)
Consultation Note Date: 03/21/19  Patient Name: Ellen Jordan  DOB: 12-29-1945  MRN: [id number]  Age / Sex: 74 y.o., female  PCP: [name], MD Referring Physician: Mercy Riding, MD  Reason for Consultation: Establishing goals of care  HPI/Patient Profile: 74 y.o. female  with past medical history of COPD on 2L oxygen, afib on Eliquis, diastolic CHF, pulmonary hypertension, CKD admitted on 03/17/2019 with recurrent falls and weakness. Hospital admission for acute on chronic diastolic CHF/RV failure with pulmonary hypertension and mitral valve stenosis. Heart failure team following. Patient receiving IV dobutamine, IV lasix, and metolazone. AKI on CKD stage 3, creat on admit 4.37. Nephrology following. Possibly cardiorenal. Patient is not a candidate for HD with RV failure. Tentative RHC this admit. Palliative medicine consultation for goals of care.   Clinical Assessment and Goals of Care:  I have reviewed medical records, discussed with care team, and met with patient at bedside to discuss goals of care. No family at bedside. Her husband drives a school bus and visits the hospital around 10am or after 5pm. Patient awake, alert, oriented and able to participate in conversation. She denies pain. She is short of breath with exertion. She is sitting up in recliner this afternoon.  Introduced Palliative Medicine as specialized medical care for people living with serious illness. It focuses on providing relief from the symptoms and stress of a serious illness. The goal is to improve quality of life for both the patient and the family.  We discussed a brief life review of the patient. Married to husband for 56 years. They have two children, daughter local and son lives in [place]. Patient reports she has been on home oxygen for about one year. In the last week, she has become significantly weaker with falls.   Discussed  events leading up to admission and course of hospitalization including diagnoses, interventions, plan of care. Reviewed notes from specialist in detail, explaining tenuous clinical condition.   I attempted to elicit values and goals of care important to the patient. Advanced directives, concepts specific to code status, artifical feeding and hydration were discussed. Patient is interested in completed AD packet and waiting on this document to be notarized. She wishes for husband to be HCPOA. She has not designated a Passenger transport manager, as she does not want to burden her daughter. Patient confirms her wish for DNR/DNI code status and shares her husband and daughter are aware of this decision.   She speaks of not wanting to "prolong the inevitable" if not getting better and even debating whether she would wish for RHC if this would not change the outcome. I did explain that she would likely not be candidate for valve replacement and a poor candidate for HD with RV heart failure.  We discussed aggressive medical management and watchful waiting, hoping for the best but also understanding how tenuous her condition is. Patient is surprisingly calm. She states 'you're never ready' for this but would want to be comfortable ("make me comfortable") if nearing EOL.  I offered to call her daughter to provide update. Patient would prefer I not call her daughter this afternoon, again does not want to be a burden on her daughter. Reassured of ongoing support from palliative medicine this admission.   Questions and concerns were addressed. Therapeutic listening and emotional/spiritual support provided.     SUMMARY OF RECOMMENDATIONS    Patient confirms DNR/DNI code status.  Patient interested in completing AD packet this admission. Chaplain following and arranging witnesses and notary. Patient wishes for her husband Hedy Camara) to be HCPOA.  Continue aggressive medical management and watchful waiting.  PMT will  continue to follow this admission.   Code Status/Advance Care Planning:  DNR  Symptom Management:   Per attending  Palliative Prophylaxis:   Aspiration, Bowel Regimen, Delirium Protocol, Frequent Pain Assessment and Oral Care  Psycho-social/Spiritual:   Desire for further Chaplaincy support: yes  Additional Recommendations: Caregiving  Support/Resources, Compassionate Wean Education and Education on Hospice  Prognosis:   Guarded. Tenuous clinical condition.  Discharge Planning: To Be Determined      Primary Diagnoses: Present on Admission: . AKI (acute kidney injury) ([place])   I have reviewed the medical record, interviewed the patient and family, and examined the patient. The following aspects are pertinent.  Past Medical History:  Diagnosis Date  . AKI (acute kidney injury) ([place]) 03/18/2019  . Allergic rhinitis   . Allergy   . Anxiety   . Atrial fibrillation ([place])   . Carpal tunnel syndrome    left  . Cataract   . Depression   . Diabetic neuropathy ([place])   . Dyspnea   . Essential hypertension   . GERD (gastroesophageal reflux disease)   . Mixed hyperlipidemia   . Normocytic anemia 12/18/2018  . PAF (paroxysmal atrial fibrillation) ([place])    a. s/p TEE cardioversion March 2017. b. Recurrence by event monitor 04/2016.  Marland Kitchen Pneumonia   . PONV (postoperative nausea and vomiting)    after previous bronchoscopy  . RBBB   . Restless leg syndrome   . Type 2 diabetes mellitus ([place])   . Uterine cancer [facility])    Social History   Socioeconomic History  . Marital status: Married    Spouse name: earl  . Number of children: 2  . Years of education: 74  . Highest education level: Not on file  Occupational History  . Occupation: retired  Tobacco Use  . Smoking status: Former Smoker    Packs/day: 1.00    Years: 50.00    Pack years: 50.00    Types: Cigarettes    Start date: 01/10/1961    Quit date: 01/10/2005    Years since quitting: 14.2  . Smokeless tobacco: Former  Systems developer    Quit date: 01/10/2005  Substance and Sexual Activity  . Alcohol use: No    Alcohol/week: 0.0 standard drinks  . Drug use: No  . Sexual activity: Not Currently    Partners: Male  Other Topics Concern  . Not on file  Social History Narrative   Moved to this area in 75 from [place].    Married for over 50 years. Has two children. Lives with husband [name] (earl)   Likes to read   Retired from [employer].   Enjoys time with family, beach, read, exericse.   Walk dog.   Eats all food groups.   Wear seatbelt.   Wear sunscreen.   Social Determinants of Health   Financial Resource Strain:   . Difficulty of Paying Living Expenses:  Food Insecurity:   . Worried About Charity fundraiser in the Last Year:   . Arboriculturist in the Last Year:   Transportation Needs:   . Film/video editor (Medical):   Marland Kitchen Lack of Transportation (Non-Medical):   Physical Activity:   . Days of Exercise per Week:   . Minutes of Exercise per Session:   Stress:   . Feeling of Stress :   Social Connections:   . Frequency of Communication with Friends and Family:   . Frequency of Social Gatherings with Friends and Family:   . Attends Religious Services:   . Active Member of Clubs or Organizations:   . Attends Archivist Meetings:   Marland Kitchen Marital Status:    Family History  Problem Relation Age of Onset  . Heart disease Mother 61  . Hyperlipidemia Mother   . Diabetes Brother   . COPD Father 68       emphysema  . Hypertension Father   . Hyperlipidemia Father   . Diabetes Daughter   . AAA (abdominal aortic aneurysm) Son   . Diabetes Brother   . Diabetes Brother   . Diabetes Brother    Scheduled Meds: . apixaban  5 mg Oral BID  . atorvastatin  40 mg Oral Daily  . Chlorhexidine Gluconate Cloth  6 each Topical Daily  . darbepoetin (ARANESP) injection - NON-DIALYSIS  60 mcg Subcutaneous Q Wed-1800  . fluticasone furoate-vilanterol  1 puff Inhalation Daily  . insulin aspart   0-5 Units Subcutaneous QHS  . insulin aspart  0-9 Units Subcutaneous TID WC  . insulin detemir  30 Units Subcutaneous BID  . mouth rinse  15 mL Mouth Rinse BID  . metolazone  2.5 mg Oral Once  . pantoprazole  40 mg Oral Daily  . sertraline  100 mg Oral Daily  . sodium chloride flush  10-40 mL Intracatheter Q12H  . sodium chloride flush  3 mL Intravenous Q12H   Continuous Infusions: . sodium chloride    . sodium chloride    . amiodarone 30 mg/hr (03/22/19 0728)  . DOBUTamine 7.5 mcg/kg/min (03/22/19 0523)  . furosemide 120 mg (03/22/19 0525)  . magnesium sulfate bolus IVPB     PRN Meds:.sodium chloride, sodium chloride, acetaminophen **OR** acetaminophen, albuterol, ALPRAZolam, ipratropium-albuterol, sodium chloride flush, sodium chloride flush Medications Prior to Admission:  Prior to Admission medications   Medication Sig Start Date End Date Taking? Authorizing Provider  Accu-Chek FastClix Lancets MISC USE TO TEST BLOOD GLUCOSE FOUR TIMES DAILY Patient taking differently: 1 Device by Other route in the morning, at noon, in the evening, and at bedtime.  02/14/19  Yes Nida, Marella Chimes, MD  ACCU-CHEK GUIDE test strip USE AS INSTRUCTED FOUR TIMES DAILY Patient taking differently: 1 each by Other route in the morning, at noon, in the evening, and at bedtime.  11/06/18  Yes Nida, Marella Chimes, MD  ACCU-CHEK SMARTVIEW test strip CHECK BLOOD GLUCOSE FOUR TIMES DAILY Patient taking differently: 1 each by Other route 4 (four) times daily as needed.  03/07/16  Yes Cassandria Anger, MD  albuterol (PROVENTIL HFA;VENTOLIN HFA) 108 (90 Base) MCG/ACT inhaler Inhale 2 puffs into the lungs every 6 (six) hours as needed for wheezing or shortness of breath. 06/07/17  Yes Hagler, Apolonio Schneiders, MD  Alcohol Swabs (B-D SINGLE USE SWABS REGULAR) PADS USE FOUR TIMES DAILY 12/04/18  Yes Nida, Marella Chimes, MD  atenolol (TENORMIN) 25 MG tablet Take 25 mg by mouth daily. 02/13/19  Yes [provider]   atorvastatin (LIPITOR) 40 MG tablet TAKE 1 TABLET EVERY DAY Patient taking differently: Take 40 mg by mouth daily.  07/16/18  Yes Satira Sark, MD  b complex vitamins tablet Take 1 tablet by mouth daily.   Yes [provider]  Biotin 5000 MCG CAPS Take 1 capsule by mouth daily.   Yes [provider]  Blood Glucose Monitoring Suppl (ACCU-CHEK GUIDE) w/Device KIT 1 each by Does not apply route 4 (four) times daily. 02/21/17  Yes Nida, Marella Chimes, MD  chlorpheniramine (EQ CHLORTABS) 4 MG tablet Take 4 mg by mouth 2 (two) times daily as needed for allergies.   Yes [provider]  cholecalciferol (VITAMIN D) 1000 units tablet Take 1,000 Units by mouth daily.   Yes [provider]  Continuous Blood Gluc Sensor (FREESTYLE LIBRE 14 DAY SENSOR) MISC Inject 1 each into the skin every 14 (fourteen) days. Use as directed. 02/19/19  Yes Nida, Marella Chimes, MD  diltiazem (CARDIZEM CD) 180 MG 24 hr capsule TAKE 1 CAPSULE EVERY DAY (DOSE INCREASED 01/04/18) Patient taking differently: Take 180 mg by mouth daily.  01/14/19  Yes Satira Sark, MD  diphenhydrAMINE (BENADRYL) 25 MG tablet Take 25 mg by mouth daily as needed for allergies.    Yes [provider]  ELIQUIS 5 MG TABS tablet TAKE 1 TABLET TWICE DAILY Patient taking differently: Take 5 mg by mouth 2 (two) times daily.  09/05/18  Yes Satira Sark, MD  flecainide (TAMBOCOR) 100 MG tablet TAKE 1 TABLET TWICE DAILY ( DOSE INCREASE ) Patient taking differently: Take 100 mg by mouth 2 (two) times daily.  11/05/18  Yes Satira Sark, MD  fluticasone furoate-vilanterol (BREO ELLIPTA) 100-25 MCG/INH AEPB Inhale 1 puff into the lungs daily. Patient taking differently: Inhale 1 puff into the lungs every other day.  09/12/18  Yes [name], MD  glucose blood (ACCU-CHEK GUIDE) test strip Use as instructed 4 x daily 02/21/17  Yes Nida, Marella Chimes, MD  insulin aspart (NOVOLOG) 100 UNIT/ML  injection Inject 10-16 Units into the skin 3 (three) times daily with meals. 05/17/18  Yes Nida, Marella Chimes, MD  insulin detemir (LEVEMIR) 100 UNIT/ML injection Inject 0.5 mLs (50 Units total) into the skin at bedtime. 07/21/16  Yes Nida, Marella Chimes, MD  ipratropium-albuterol (DUONEB) 0.5-2.5 (3) MG/3ML SOLN Take 3 mLs by nebulization every 6 (six) hours as needed (wheezing/ shortness of breath).   Yes [provider]  liraglutide (VICTOZA) 18 MG/3ML SOPN Inject 0.3 mLs (1.8 mg total) into the skin daily. 08/15/18  Yes Nida, Marella Chimes, MD  lisinopril (PRINIVIL,ZESTRIL) 20 MG tablet TAKE 1 TABLET (20 MG TOTAL) BY MOUTH DAILY. Patient taking differently: Take 10 mg by mouth daily. Patient is currently taking a 1/2 tablet due to low BP 09/04/17  Yes Hagler, Apolonio Schneiders, MD  Misc Natural Products (ESTROVEN + ENERGY MAX STRENGTH) TABS Take 1 tablet by mouth daily.   Yes [provider]  Multiple Vitamin (MULTIVITAMIN) tablet Take 1 tablet by mouth daily.   Yes [provider]  omeprazole (PRILOSEC) 20 MG capsule TAKE 1 CAPSULE EVERY DAY Patient taking differently: Take 20 mg by mouth daily.  02/13/19  Yes Satira Sark, MD  sertraline (ZOLOFT) 100 MG tablet TAKE 1 TABLET (100 MG TOTAL) BY MOUTH DAILY. 09/04/17  Yes Caren Macadam, MD  torsemide (DEMADEX) 20 MG tablet Take 20 mg by mouth 2 (two) times daily as needed.  04/03/18  Yes [provider]  TRELEGY ELLIPTA 100-62.5-25 MCG/INH AEPB Inhale 1 puff into the lungs every other day.  03/08/19  Yes [provider]  vitamin E (VITAMIN E) 400 UNIT capsule Take 400 Units by mouth daily.   Yes [provider]   Allergies  Allergen Reactions  . Penicillins Hives    Has patient had a PCN reaction causing immediate rash, facial/tongue/throat swelling, SOB or lightheadedness with hypotension: Yes Has patient had a PCN reaction causing severe rash involving mucus membranes or skin necrosis: No Has  patient had a PCN reaction that required hospitalization No Has patient had a PCN reaction occurring within the last 10 years: No If all of the above answers are "NO", then may proceed with Cephalosporin use.    Review of Systems  Constitutional: Positive for activity change.  Respiratory: Positive for shortness of breath.   Cardiovascular: Positive for leg swelling.   Physical Exam Vitals and nursing note reviewed.  Constitutional:      General: She is awake.     Appearance: She is ill-appearing.  HENT:     Head: Normocephalic and atraumatic.  Cardiovascular:     Rate and Rhythm: Rhythm irregularly irregular.  Pulmonary:     Effort: No tachypnea, accessory muscle usage or respiratory distress.     Comments: Dyspnea with exertion. Skin:    General: Skin is warm and dry.  Neurological:     Mental Status: She is alert and oriented to person, place, and time.  Psychiatric:        Mood and Affect: Mood normal.        Speech: Speech normal.        Behavior: Behavior normal.        Cognition and Memory: Cognition normal.    Vital Signs: BP (!) 117/50 (BP Location: Left Arm)   Pulse 92   Temp 97.8 F (36.6 C) (Oral)   Resp 16   Ht _0  (1.6 m)   Wt 91.2 kg   SpO2 94%   BMI 35.62 kg/m  Pain Scale: 0-10   Pain Score: 0-No pain   SpO2: SpO2: 94 % O2 Device:SpO2: 94 % O2 Flow Rate: .O2 Flow Rate (L/min): 1 L/min  IO: Intake/output summary:   Intake/Output Summary (Last 24 hours) at 03/22/2019 0925 Last data filed at 03/22/2019 0827 Gross per 24 hour  Intake 504.25 ml  Output 4450 ml  Net -3945.75 ml    LBM: Last BM Date: 03/20/19 Baseline Weight: Weight: 79.4 kg Most recent weight: Weight: 91.2 kg     Palliative Assessment/Data: PPS 50%   Flowsheet Rows     Most Recent Value  Intake Tab  Referral Department  Hospitalist  Unit at Time of Referral  Cardiac/Telemetry Unit  Palliative Care Primary Diagnosis  Cardiac  Palliative Care Type  New Palliative care    Reason for referral  Clarify Goals of Care  Date first seen by Palliative Care  03/21/19  Clinical Assessment  Palliative Performance Scale Score  50%  Psychosocial & Spiritual Assessment  Palliative Care Outcomes  Patient/Family meeting held?  Yes  Who was at the meeting?  patient  Palliative Care Outcomes  Clarified goals of care, Provided end of life care assistance, Provided psychosocial or spiritual support, ACP counseling assistance      Time In: 1500 Time Out: 1610 Time Total: 25mn Greater than 50%  of this time was spent counseling and coordinating care related to the above assessment and plan.  Signed by:  Ihor Dow, DNP, FNP-C Palliative Medicine Team  Phone: [phone] Fax: [phone]   Please contact Palliative Medicine Team phone at [phone] for questions and concerns.  For individual provider: See Shea Evans

## 2019-03-21 NOTE — Telephone Encounter (Signed)
Pt is currently in the hospital and it seems like they are adjusting pt's meds.

## 2019-03-22 DIAGNOSIS — Z515 Encounter for palliative care: Secondary | ICD-10-CM

## 2019-03-22 DIAGNOSIS — I482 Chronic atrial fibrillation, unspecified: Secondary | ICD-10-CM

## 2019-03-22 DIAGNOSIS — Z7189 Other specified counseling: Secondary | ICD-10-CM

## 2019-03-22 LAB — CBC
HCT: 27 % — ABNORMAL LOW (ref 36.0–46.0)
Hemoglobin: 8.7 g/dL — ABNORMAL LOW (ref 12.0–15.0)
MCH: 30.6 pg (ref 26.0–34.0)
MCHC: 32.2 g/dL (ref 30.0–36.0)
MCV: 95.1 fL (ref 80.0–100.0)
Platelets: 153 10*3/uL (ref 150–400)
RBC: 2.84 MIL/uL — ABNORMAL LOW (ref 3.87–5.11)
RDW: 15.1 % (ref 11.5–15.5)
WBC: 5.4 10*3/uL (ref 4.0–10.5)
nRBC: 0 % (ref 0.0–0.2)

## 2019-03-22 LAB — MAGNESIUM: Magnesium: 1.8 mg/dL (ref 1.7–2.4)

## 2019-03-22 LAB — RENAL FUNCTION PANEL
Albumin: 3.2 g/dL — ABNORMAL LOW (ref 3.5–5.0)
Anion gap: 14 (ref 5–15)
BUN: 59 mg/dL — ABNORMAL HIGH (ref 8–23)
CO2: 23 mmol/L (ref 22–32)
Calcium: 9.7 mg/dL (ref 8.9–10.3)
Chloride: 93 mmol/L — ABNORMAL LOW (ref 98–111)
Creatinine, Ser: 2.55 mg/dL — ABNORMAL HIGH (ref 0.44–1.00)
GFR calc Af Amer: 21 mL/min — ABNORMAL LOW (ref >60)
GFR calc non Af Amer: 18 mL/min — ABNORMAL LOW (ref >60)
Glucose, Bld: 134 mg/dL — ABNORMAL HIGH (ref 70–99)
Phosphorus: 5.5 mg/dL — ABNORMAL HIGH (ref 2.5–4.6)
Potassium: 4 mmol/L (ref 3.5–5.1)
Sodium: 130 mmol/L — ABNORMAL LOW (ref 135–145)

## 2019-03-22 LAB — COOXEMETRY PANEL
Carboxyhemoglobin: 1.4 % (ref 0.5–1.5)
Methemoglobin: 0.9 % (ref 0.0–1.5)
O2 Saturation: 59.5 %
Total hemoglobin: 8.7 g/dL — ABNORMAL LOW (ref 12.0–16.0)

## 2019-03-22 LAB — GLUCOSE, CAPILLARY
Glucose-Capillary: 122 mg/dL — ABNORMAL HIGH (ref 70–99)
Glucose-Capillary: 99 mg/dL (ref 70–99)
Glucose-Capillary: 148 mg/dL — ABNORMAL HIGH (ref 70–99)
Glucose-Capillary: 234 mg/dL — ABNORMAL HIGH (ref 70–99)
Glucose-Capillary: 187 mg/dL — ABNORMAL HIGH (ref 70–99)

## 2019-03-22 MED ORDER — METOLAZONE 2.5 MG PO TABS
2.5000 mg | ORAL_TABLET | Freq: Once | ORAL | Status: AC
  Administered 2019-03-22: 2.5 mg via ORAL
  Filled 2019-03-22: qty 1

## 2019-03-22 MED ORDER — MAGNESIUM SULFATE IN D5W 1-5 GM/100ML-% IV SOLN
1.0000 g | Freq: Once | INTRAVENOUS | Status: AC
  Administered 2019-03-22: 1 g via INTRAVENOUS
  Filled 2019-03-22: qty 100

## 2019-03-22 MED ORDER — ACETAMINOPHEN 500 MG PO TABS
1000.0000 mg | ORAL_TABLET | Freq: Three times a day (TID) | ORAL | Status: DC
  Administered 2019-03-22 – 2019-03-28 (×18): 1000 mg via ORAL
  Filled 2019-03-22 (×20): qty 2

## 2019-03-22 MED ORDER — LOPERAMIDE HCL 2 MG PO CAPS
2.0000 mg | ORAL_CAPSULE | ORAL | Status: DC | PRN
  Administered 2019-03-22 – 2019-03-25 (×3): 2 mg via ORAL
  Filled 2019-03-22 (×3): qty 1

## 2019-03-22 MED ORDER — SODIUM CHLORIDE 0.9% FLUSH
3.0000 mL | Freq: Two times a day (BID) | INTRAVENOUS | Status: DC
  Administered 2019-03-23 – 2019-03-26 (×4): 3 mL via INTRAVENOUS

## 2019-03-22 NOTE — Progress Notes (Addendum)
Daily Progress Note   Patient Name: Ellen Jordan       Date: 03/22/2019 DOB: 1945-08-27  Age: 74 y.o. MRN#: [id number] Attending Physician: Mercy Riding, MD Primary Care Physician: [name], MD Admit Date: 03/17/2019  Reason for Consultation/Follow-up: Establishing goals of care and Psychosocial/spiritual support  Subjective: Spoke with Ellen Jordan as she is sitting in a recliner chair.  She is hesitant to eat as she is having diarrhea.  She has heard that her labs look better today.  Kidneys seem to be improving.  Husband is coming later today.  I helped place her on the bedside commode and committed to return when her husband arrives.   Assessment: Very pleasant, alert, coherent female.  Weak and deconditioned.  Cardiorenal syndrome - hopefully improving via efforts of HF team and nephrology.  Heart catheterization being considered.  DCCV being considered.  Patient Profile/HPI:  74 y.o. female  with past medical history of COPD on 2L oxygen, afib on Eliquis, diastolic CHF, pulmonary hypertension, CKD admitted on 03/17/2019 with recurrent falls and weakness. Hospital admission for acute on chronic diastolic CHF/RV failure with pulmonary hypertension and mitral valve stenosis. Heart failure team following. Patient receiving IV dobutamine, IV lasix, and metolazone. AKI on CKD stage 3, creat on admit 4.37. Nephrology following. Possibly cardiorenal. Patient is not a candidate for HD with RV failure. Tentative RHC this admit. Palliative medicine consultation for goals of care.      Length of Stay: 4  Current Medications: Scheduled Meds:  . acetaminophen  1,000 mg Oral Q8H  . apixaban  5 mg Oral BID  . atorvastatin  40 mg Oral Daily  . Chlorhexidine Gluconate Cloth  6 each Topical Daily   . darbepoetin (ARANESP) injection - NON-DIALYSIS  60 mcg Subcutaneous Q Wed-1800  . fluticasone furoate-vilanterol  1 puff Inhalation Daily  . insulin aspart  0-5 Units Subcutaneous QHS  . insulin aspart  0-9 Units Subcutaneous TID WC  . insulin detemir  30 Units Subcutaneous BID  . mouth rinse  15 mL Mouth Rinse BID  . pantoprazole  40 mg Oral Daily  . sertraline  100 mg Oral Daily  . sodium chloride flush  10-40 mL Intracatheter Q12H  . sodium chloride flush  3 mL Intravenous Q12H  . sodium chloride flush  3 mL  Intravenous Q12H    Continuous Infusions: . sodium chloride    . sodium chloride 250 mL (03/22/19 1030)  . amiodarone 30 mg/hr (03/22/19 0728)  . DOBUTamine 7.5 mcg/kg/min (03/22/19 0523)  . furosemide 120 mg (03/22/19 1157)    PRN Meds: sodium chloride, sodium chloride, albuterol, ALPRAZolam, ipratropium-albuterol, loperamide, sodium chloride flush, sodium chloride flush  Physical Exam        Well developed elderly female, awake, alert, coherent, appropriate.   Sitting in recliner chair.  Able to transfer with assistance and RW to [facility].  Vital Signs: BP 137/62 (BP Location: Left Arm)   Pulse 91   Temp 97.7 F (36.5 C) (Oral)   Resp 20   Ht _0  (1.6 m)   Wt 91.2 kg   SpO2 95%   BMI 35.62 kg/m  SpO2: SpO2: 95 % O2 Device: O2 Device: Nasal Cannula O2 Flow Rate: O2 Flow Rate (L/min): 1 L/min  Intake/output summary:   Intake/Output Summary (Last 24 hours) at 03/22/2019 1253 Last data filed at 03/22/2019 1002 Gross per 24 hour  Intake 274.57 ml  Output 4700 ml  Net -4425.43 ml   LBM: Last BM Date: 03/20/19 Baseline Weight: Weight: 79.4 kg Most recent weight: Weight: 91.2 kg       Palliative Assessment/Data: 40%     Patient Active Problem List   Diagnosis Date Noted  . Palliative care by specialist   . Goals of care, counseling/discussion   . Chronic atrial fibrillation ([place])   . Acute respiratory failure with hypoxemia ([place])   . RVF (right  ventricular failure) ([place])   . AKI (acute kidney injury) ([place]) 03/18/2019  . Uncontrolled type 2 diabetes mellitus with hyperglycemia ([place]) 02/19/2019  . Atrial fibrillation with RVR ([place]) 12/18/2018  . Hypokalemia 12/18/2018  . Normocytic anemia 12/18/2018  . Mediastinal lymphadenopathy 12/18/2018  . Pulmonary nodules 12/18/2018  . Pulmonary hypertension ([place]) 09/08/2018  . History of tobacco abuse 11/09/2016  . Controlled diabetes mellitus type 2 with complications ([place]) 09/81/1914  . Restless leg syndrome 11/04/2016  . PAF (paroxysmal atrial fibrillation) ([place]) 06/03/2016  . RBBB 06/03/2016  . SOB (shortness of breath)   . Atrial fibrillation with rapid ventricular response ([place]) 02/28/2015  . Uncontrolled type 2 diabetes mellitus without complication, with long-term current use of insulin 12/24/2014  . Mixed hyperlipidemia 12/24/2014  . Essential hypertension, benign 12/24/2014    Palliative Care Plan    Recommendations/Plan:  Patient does not want to go thru procedures if it will not change her overall prognosis. (Not a candidate for HD or valve replacement)  Continue current care.  DNR/DNI with full scope treatment.  PMT will continue to follow with you.   Will meet husband when he arrives this afternoon and answer any questions he may have.  Goals of Care and Additional Recommendations:  Limitations on Scope of Treatment: Full Scope Treatment  Code Status:  DNR  Prognosis:   Unable to determine, Guarded.   Discharge Planning:  To Be Determined  Care plan was discussed with patient at bedside.  Thank you for allowing the Palliative Medicine Team to assist in the care of this patient.  Total time spent:  35 min.     Greater than 50%  of this time was spent counseling and coordinating care related to the above assessment and plan.  Florentina Jenny, PA-C Palliative Medicine  Please contact Palliative MedicineTeam phone at [phone] for questions and  concerns between 7 am - 7 pm.   Please see AMION  for individual provider pager numbers.

## 2019-03-22 NOTE — Progress Notes (Signed)
PROGRESS NOTE  LEIGHTON LUSTER QAS:[id number] DOB: 1945-05-26   PCP: [name], MD  Patient is from: Home.  Lives with her husband.  Recently started using walker due to recurrent fall and weakness.  DOA: 03/17/2019 LOS: 4  Brief Narrative / Interim history: 74 year old female with history of COPD on 2 L, ILD, A. fib on Eliquis, diastolic CHF, Pulm HTN, DM-2, CKD-3B, HTN and obesity presented to [facility] with progressive shortness of breath and rectal bleed and admitted for acute on chronic renal failure and acute on chronic diastolic CHF.   At [facility] ED, Cr 2.99 (baseline about 1.4).  CXR with mild pulmonary vascular congestion.  Cardiology consulted there but not able to diurese patient due to worsening renal function.  She was transferred to [facility] for nephrology input.  Nephrology, advanced heart failure team, PCCM and palliative care consulted.  Started on high-dose IV Lasix with dobutamine.  Subjective: No major events overnight or this morning.  Feels better this morning.  Still with some shortness of breath.  Also reports back pain which is chronic for her.  Reports some epistaxis.  She says she has allergies.  She denies chest pain, GI or UTI symptoms.  Objective: Vitals:   03/22/19 0540 03/22/19 0541 03/22/19 0542 03/22/19 0839  BP:  111/68  (!) 117/50  Pulse: 93 93 94 92  Resp: _0 Temp:    97.8 F (36.6 C)  TempSrc:    Oral  SpO2: 92% 92% 93% 94%  Weight:   91.2 kg   Height:        Intake/Output Summary (Last 24 hours) at 03/22/2019 1140 Last data filed at 03/22/2019 1002 Gross per 24 hour  Intake 504.25 ml  Output 4850 ml  Net -4345.75 ml   Filed Weights   03/20/19 0431 03/21/19 0604 03/22/19 0542  Weight: 93.3 kg 94.3 kg 91.2 kg    Examination:   GENERAL: No apparent distress.  Sitting on bedside chair. HEENT: MMM.  No active bleed but signs of previous bleed in both nostrils. NECK: Supple.  Notable JVD. RESP: On room air.   No IWOB.  Fair aeration bilaterally. CVS:  RRR. Heart sounds normal.  ABD/GI/GU: Bowel sounds present. Soft. Non tender.  MSK/EXT:  Moves extremities.  Unna boots over BLE. SKIN: no apparent skin lesion or wound NEURO: Awake, alert and oriented appropriately.  No apparent focal neuro deficit. PSYCH: Calm. Normal affect.  Procedures:  None  Assessment & Plan: Acute on chronic diastolic CHF/RV failure/Pulm HTN: Echo with EF of 60 to 65%, indeterminate diastolic function and interventricular septum flattening in systole and diastole consistent with right ventricular pressure and volume overload.  Likely cor pulmonale from underlying lung disease.  She has no significant crackles on exam.  Has progressive dyspnea and DOE but good saturation on 2 L by [place].  BNP elevated but no recent values to compare to.  She is on torsemide at home.  Responding to dobutamine and IV Lasix.  About 3.5 L UOP in the last 24 hours.  Renal function improving. -Advanced HF team managing-on dobutamine 7.5, metolazone and IV Lasix 120 mg 3 times daily -Continue monitoring fluid status, renal function and electrolytes. -Sodium and fluid restrictions. -Possibly R/LHC on 3/15  Mitral valve stenosis -Plan for further evaluation with heart cath when stable -Per cardiology-not a good candidate for valve replacement  Atrial flutter with wide-QRS and QTc-HR fairly controlled. QT could be exaggerated by wide QRS due  to RBBB. -Cardiology managing-flecainide, Cardizem and atenolol discontinued. -Started on amiodarone drip while on inotropes. -On Eliquis for anticoagulation. -Optimize electrolytes -Possible DCCV down the road when off dobutamine.  AKI on CKD-3B/azotemia: Cr 1.4 (on 02/14/2019)> 2.99 (admit)>> 4.37> 4.27> 3.79> 2.55.  BUN 44>> 66> 59.  Suspect prerenal etiology from hypotension and heart failure.  She is also on torsemide and lisinopril at home which could contribute. FENa 1.3% suggesting intrinsic etiology which  could happen after initial prerenal insult.  Renal US-CKD, no Hydro.  UA negative.  Renal function improving with diuretics and inotropes. -Not a candidate for HD due to RV failure per nephrology -Continue monitoring  Progressive dyspnea/DOE-mainly due to CHF and pulmonary hypertension at this time.  Has a history of COPD.  Also some concern about ILD but  high-resolution CT on 03/01/2019 didn't confirm ILD. -Treat treatable causes as above -Wean oxygen to maintain saturation above 90% -Appreciate guidance by PCCM  Chronic COPD/respiratory failure on 2 L by nasal cannula at baseline. -Continue Breo Ellipta, duo nebs and supplemental oxygen -Appreciate insight by PCCM  Controlled DM-2 with hyperglycemia: A1c 6.7%.  On Levemir 15 units nightly and NovoLog 10 to 16 units 3 times daily at home. Recent Labs    03/21/19 2037 03/22/19 0543 03/22/19 0629  GLUCAP 177* 99 122*  -Continue Levemir to 30 units twice daily, SSI-thin and a statin  Normocytic anemia: Baseline Hgb 9-10> 9.4 (admit)> 9.5> 8.9> 8.7.  Iron sat 10%.  TIBC 473.  Mention of rectal bleed on presentation.  Husband thinks she might have wiped herself hard.  She is on Eliquis. -Continue monitoring -IV iron and ESA per nephrology.  Anion gap metabolic acidosis: Likely due to azotemia/renal failure.  Resolved.  Hypervolemic hyponatremia-likely due to CHF or renal failure.  She is also on SSRI and lisinopril which could contribute.  TSH and a.m. cortisol normal.  Urine sodium 32.  Improved. -Received tolvaptan once on 3/11. -Continue monitoring  Anxiety and depression: Stable -Continue Zoloft and Xanax.  Hyperlipidemia -Fasting lipid panel in the morning -continue statins  GERD -continue protonix  Morbid obesity: Body mass index is 35.62 kg/m.  Patient with diabetes. -Encourage lifestyle change to lose weight -May benefit from newer agents with cardiovascular benefits  At risk for polypharmacy-noted multiple  sedating medications including Benadryl, chlorpheniramine -Encouraged to avoid OTC medications  Pulmonary nodules: Noted on high-resolution CT on 02/19/2019 -Follow-up CT in 12 months as recommended.  Liver cirrhosis?  Noted on high-resolution CT last months.  Debility/Generalized weakness: recently started using walker recently -PT/OT   Goal of care: DNR/DNI appropriately.  Multiple comorbidities as above.  Not a candidate for dialysis. -Palliative care consulted.               DVT prophylaxis: On Eliquis for A. fib Code Status: DNR/DNI Family Communication: Updated patient's husband over the phone on 3/10.  Discharge barrier: Evaluation and treatment of acute renal failure and decompensated CHF.  On inotropes and IV diuretics Patient is from: Home Final disposition: To be determined once medically stable and cleared by consultants.  Consultants: Cardiology, nephrology, PCCM, advanced HF team and palliative care   Microbiology summarized: COVID-19 screen negative.  Sch Meds:  Scheduled Meds: . apixaban  5 mg Oral BID  . atorvastatin  40 mg Oral Daily  . Chlorhexidine Gluconate Cloth  6 each Topical Daily  . darbepoetin (ARANESP) injection - NON-DIALYSIS  60 mcg Subcutaneous Q Wed-1800  . fluticasone furoate-vilanterol  1 puff Inhalation Daily  .  insulin aspart  0-5 Units Subcutaneous QHS  . insulin aspart  0-9 Units Subcutaneous TID WC  . insulin detemir  30 Units Subcutaneous BID  . mouth rinse  15 mL Mouth Rinse BID  . metolazone  2.5 mg Oral Once  . pantoprazole  40 mg Oral Daily  . sertraline  100 mg Oral Daily  . sodium chloride flush  10-40 mL Intracatheter Q12H  . sodium chloride flush  3 mL Intravenous Q12H   Continuous Infusions: . sodium chloride    . sodium chloride 250 mL (03/22/19 1030)  . amiodarone 30 mg/hr (03/22/19 0728)  . DOBUTamine 7.5 mcg/kg/min (03/22/19 0523)  . furosemide 120 mg (03/22/19 0525)   PRN Meds:.sodium chloride, sodium  chloride, acetaminophen **OR** acetaminophen, albuterol, ALPRAZolam, ipratropium-albuterol, sodium chloride flush, sodium chloride flush  Antimicrobials: Anti-infectives (From admission, onward)   None       I have personally reviewed the following labs and images: CBC: Recent Labs  Lab 03/17/19 2212 03/18/19 0505 03/20/19 1248 03/21/19 0630 03/22/19 0439  WBC 10.8* 9.7 8.4 6.3 5.4  NEUTROABS 9.5*  --   --   --   --   HGB 9.4* 9.5* 9.5* 8.9* 8.7*  HCT 30.3* 31.5* 28.8* 27.9* 27.0*  MCV 97.4 99.1 93.2 94.3 95.1  PLT 176 192 168 154 153   BMP &GFR Recent Labs  Lab 03/18/19 0505 03/18/19 0505 03/19/19 0951 03/20/19 0410 03/21/19 0630 03/21/19 1722 03/22/19 0439  NA 131*   < > 126* 127* 126* 127* 130*  K 5.0  --  4.4 5.1 4.6  --  4.0  CL 96*  --  92* 93* 93*  --  93*  CO2 19*  --  20* 18* 20*  --  23  GLUCOSE 197*  --  218* 141* 180*  --  134*  BUN 46*  --  59* 61* 66*  --  59*  CREATININE 2.83*  --  4.37* 4.27* 3.79*  --  2.55*  CALCIUM 8.6*  --  8.4* 8.7* 8.9  --  9.7  MG  --   --   --   --  1.8  --  1.8  PHOS  --   --   --   --  6.6*  --  5.5*   < > = values in this interval not displayed.   Estimated Creatinine Clearance: 20.4 mL/min (A) (by C-G formula based on SCr of 2.55 mg/dL (H)). Liver & Pancreas: Recent Labs  Lab 03/17/19 2212 03/18/19 0505 03/21/19 0630 03/22/19 0439  AST 28 30  --   --   ALT 20 18  --   --   ALKPHOS 63 62  --   --   BILITOT 0.6 0.6  --   --   PROT 7.2 7.1  --   --   ALBUMIN 3.8 3.7 3.1* 3.2*   No results for input(s): LIPASE, AMYLASE in the last 168 hours. No results for input(s): AMMONIA in the last 168 hours. Diabetic: No results for input(s): HGBA1C in the last 72 hours. Recent Labs  Lab 03/21/19 1109 03/21/19 1617 03/21/19 2037 03/22/19 0543 03/22/19 0629  GLUCAP 227* 203* 177* 99 122*   Cardiac Enzymes: No results for input(s): CKTOTAL, CKMB, CKMBINDEX, TROPONINI in the last 168 hours. Recent Labs     09/12/18 1201  PROBNP 51.0   Coagulation Profile: No results for input(s): INR, PROTIME in the last 168 hours. Thyroid Function Tests: No results for input(s): TSH, T4TOTAL, FREET4, T3FREE, THYROIDAB  in the last 72 hours. Lipid Profile: Recent Labs    03/21/19 0605  CHOL 90  HDL 22*  LDLCALC 44  TRIG 118  CHOLHDL 4.1   Anemia Panel: Recent Labs    03/20/19 1248 03/21/19 0630  VITAMINB12 1,812*  --   FOLATE 32.3  --   FERRITIN 45 39  TIBC 522* 473*  IRON 71 45  RETICCTPCT 2.5  --    Urine analysis:    Component Value Date/Time   COLORURINE YELLOW 03/20/2019 2030   [place] 03/20/2019 2030   LABSPEC 1.010 03/20/2019 2030   [place] 5.0 03/20/2019 2030   GLUCOSEU NEGATIVE 03/20/2019 2030   [place] NEGATIVE 03/20/2019 2030   [place] NEGATIVE 03/20/2019 2030   [place] 03/20/2019 2030   PROTEINUR 30 (A) 03/20/2019 2030   NITRITE NEGATIVE 03/20/2019 2030   LEUKOCYTESUR SMALL (A) 03/20/2019 2030   Sepsis Labs: Invalid input(s): PROCALCITONIN, [place]  Microbiology: Recent Results (from the past 240 hour(s))  SARS CORONAVIRUS 2 (TAT 6-24 HRS) Nasopharyngeal Nasopharyngeal Swab     Status: None   Collection Time: 03/18/19 12:10 AM   Specimen: Nasopharyngeal Swab  Result Value Ref Range Status   SARS Coronavirus 2 NEGATIVE NEGATIVE Final    Comment: (NOTE) SARS-CoV-2 target nucleic acids are NOT DETECTED. The SARS-CoV-2 RNA is generally detectable in upper and lower respiratory specimens during the acute phase of infection. Negative results do not preclude SARS-CoV-2 infection, do not rule out co-infections with other pathogens, and should not be used as the sole basis for treatment or other patient management decisions. Negative results must be combined with clinical observations, patient history, and epidemiological information. The expected result is Negative. Fact Sheet for Patients: SugarRoll.be Fact  Sheet for Healthcare Providers: https://www.woods-mathews.com/ This test is not yet approved or cleared by the [place] FDA and  has been authorized for detection and/or diagnosis of SARS-CoV-2 by FDA under an Emergency Use Authorization (EUA). This EUA will remain  in effect (meaning this test can be used) for the duration of the COVID-19 declaration under Section 56 4(b)(1) of the Act, 21 U.S.C. section 360bbb-3(b)(1), unless the authorization is terminated or revoked sooner. Performed at [facility], [place] [address], [place], [place] [address]     Radiology Studies: No results found.  Akeelah Seppala T. [place]  If 7PM-7AM, please contact night-coverage [email or web address] Password [facility] 03/22/2019, 11:40 AM

## 2019-03-22 NOTE — Progress Notes (Addendum)
I returned to speak with Mr. [name]) and Mrs. Dodge together this evening.  Mr. Bourquin expressed concern "I don't understand why she has been seeing the heart doctors for years and she's not improving?"  We talked about the chronic and progressive nature for her illness.    He expressed further concern that he can not take care of her at home.  She has fallen repeatedly and he is afraid she will be severely hurt or die from a fall.  We discussed the option of possibly having additional privately hired care in the home or SNF Rehab.  Privately hired care in the home is not an option.  They do not have any family that they can depend on regularly close by.  We discussed SNF.  After waiting patiently Mrs. Cillo firmly stated that she will not go to SNF.  She feels it would kill her.    We discussed Hospice services in the home.  We discussed the hospice philosophy.  To my surprise, both Mr and Mrs. Loredo seemed interested in Hospice.  We discussed the services brought into the home and what Hospice includes.  I encouraged Mr and Mrs Sieben to think about it further before making a decision.  I encouraged them to discuss it with their children.  They agreed.  Mrs. Mcgloin would like to move forward with the heart catheterization.  She wants to see if there is something that can be fixed that will make a real difference in her health.   We agreed to touch base again over the weekend.    Heart Cath planned for Monday.   Pending those results, Mrs. Brazee will likely go home with Hospice services.  She is firmly against going to SNF rehab.   Florentina Jenny, PA-C Palliative Medicine Office:  [phone]  Greater than 50%  of this time was spent counseling and coordinating care related to the above assessment and plan. Time in 5:05 Time out:  5:55

## 2019-03-22 NOTE — Progress Notes (Signed)
Patient ID: Ellen Jordan, female   DOB: [birth year], 74 y.o.   MRN: [id number]     Advanced Heart Failure Rounding Note  PCP-Cardiologist: Rozann Lesches, MD   Subjective:    Now on dobutamine 7.5 mcg/kg/min with co-ox 59.5%.  Creatinine trending down, 2.55 today.  Good UOP yesterday, weight down 6 lbs.  CVP 18-19 today.   Feeling a bit better but still very weak.    Objective:   Weight Range: 91.2 kg Body mass index is 35.62 kg/m.   Vital Signs:   Temp:  [97.3 F (36.3 C)-97.8 F (36.6 C)] 97.8 F (36.6 C) (03/12 0839) Pulse Rate:  [78-101] 92 (03/12 0839) Resp:  [15-29] 26 (03/12 0839) BP: (94-142)/(49-83) 117/50 (03/12 0839) SpO2:  [89 %-99 %] 94 % (03/12 0839) Weight:  [91.2 kg] 91.2 kg (03/12 0542) Last BM Date: 03/20/19  Weight change: Filed Weights   03/20/19 0431 03/21/19 0604 03/22/19 0542  Weight: 93.3 kg 94.3 kg 91.2 kg    Intake/Output:   Intake/Output Summary (Last 24 hours) at 03/22/2019 0847 Last data filed at 03/22/2019 0827 Gross per 24 hour  Intake 624.25 ml  Output 4450 ml  Net -3825.75 ml      Physical Exam   CVP 18-19 General: NAD Neck: JVP 16 cm, no thyromegaly or thyroid nodule.  Lungs: Clear to auscultation bilaterally with normal respiratory effort. CV: Nondisplaced PMI.  Heart irregular S1/S2, no S3/S4, no murmur.  1+ edema to thighs, has on unna boots.   Abdomen: Soft, nontender, no hepatosplenomegaly, no distention.  Skin: Intact without lesions or rashes.  Neurologic: Alert and oriented x 3.  Psych: Normal affect. Extremities: No clubbing or cyanosis.  HEENT: Normal.    Telemetry  Atrial fibrillation 90s-100s  EKG   n/a   Labs    CBC Recent Labs    03/21/19 0630 03/22/19 0439  WBC 6.3 5.4  HGB 8.9* 8.7*  HCT 27.9* 27.0*  MCV 94.3 95.1  PLT 154 272   Basic Metabolic Panel Recent Labs    03/21/19 0630 03/21/19 0630 03/21/19 1722 03/22/19 0439  NA 126*   < > 127* 130*  K 4.6  --   --  4.0  CL 93*  --    --  93*  CO2 20*  --   --  23  GLUCOSE 180*  --   --  134*  BUN 66*  --   --  59*  CREATININE 3.79*  --   --  2.55*  CALCIUM 8.9  --   --  9.7  MG 1.8  --   --  1.8  PHOS 6.6*  --   --  5.5*   < > = values in this interval not displayed.   Liver Function Tests Recent Labs    03/21/19 0630 03/22/19 0439  ALBUMIN 3.1* 3.2*   No results for input(s): LIPASE, AMYLASE in the last 72 hours. Cardiac Enzymes No results for input(s): CKTOTAL, CKMB, CKMBINDEX, TROPONINI in the last 72 hours.  BNP: BNP (last 3 results) Recent Labs    03/17/19 2213  BNP 338.0*    ProBNP (last 3 results) Recent Labs    09/12/18 1201  PROBNP 51.0     D-Dimer No results for input(s): DDIMER in the last 72 hours. Hemoglobin A1C No results for input(s): HGBA1C in the last 72 hours. Fasting Lipid Panel Recent Labs    03/21/19 0605  CHOL 90  HDL 22*  LDLCALC 44  TRIG 118  CHOLHDL  4.1   Thyroid Function Tests No results for input(s): TSH, T4TOTAL, T3FREE, THYROIDAB in the last 72 hours.  Invalid input(s): FREET3  Other results:   Imaging    No results found.   Medications:     Scheduled Medications: . apixaban  5 mg Oral BID  . atorvastatin  40 mg Oral Daily  . Chlorhexidine Gluconate Cloth  6 each Topical Daily  . darbepoetin (ARANESP) injection - NON-DIALYSIS  60 mcg Subcutaneous Q Wed-1800  . fluticasone furoate-vilanterol  1 puff Inhalation Daily  . insulin aspart  0-5 Units Subcutaneous QHS  . insulin aspart  0-9 Units Subcutaneous TID WC  . insulin detemir  30 Units Subcutaneous BID  . mouth rinse  15 mL Mouth Rinse BID  . metolazone  2.5 mg Oral Once  . pantoprazole  40 mg Oral Daily  . sertraline  100 mg Oral Daily  . sodium chloride flush  10-40 mL Intracatheter Q12H  . sodium chloride flush  3 mL Intravenous Q12H    Infusions: . sodium chloride    . sodium chloride    . amiodarone 30 mg/hr (03/22/19 0728)  . DOBUTamine 7.5 mcg/kg/min (03/22/19 0523)  .  furosemide 120 mg (03/22/19 0525)  . magnesium sulfate bolus IVPB      PRN Medications: sodium chloride, sodium chloride, acetaminophen **OR** acetaminophen, albuterol, ALPRAZolam, ipratropium-albuterol, sodium chloride flush, sodium chloride flush   Assessment/Plan   1. RV failure: Echo this admission shows LV EF 65-70% with RV moderate-severely dilated and moderate dysfunction, D-shaped septum, the mitral valve is severely calcified though mean gradient is only about 5 mmHg suggesting mild to moderate mitral stenosis (pressure half-time does not appear short), mild AS.  The patient is markedly volume overloaded.  I am concerned that she has end-stage RV dysfunction from pulmonary hypertension.  She also has mitral stenosis which visually looks significant but measures only mild-moderate by mean gradient and pressure half-time.  She has been oliguric with creatinine up to 4.27 => down to 2.55 today on dobutamine.  We are trying inotropes empirically to see if we can improve RV function enough to achieve diuresis.  Given her markedly low GFR and soft BP at times,  She was started on dobutamine 3 mcg/kg/min, now has been increased to 7.5 with improved diuresis and creatinine coming down.  Co-ox 59.5% today with CVP 18-19.  - Continue dobutamine 7.5.  - Continue Lasix 120 mg IV every 8 hrs, can get metolazone 2.5 x 1 today with a dose of KCl.   - RHC/LHC (hemodynamics only to assess mitral stenosis) on Monday.   - Suspect prognosis is overall poor with severe RV failure.  Not certain we will be able to maintain improvement after weaning her off dobutamine.  2. Pulmonary hypertension: PASP estimated at 41 mmHg on echo this admission and 42 mmHg on echo in 2/20.  However, there has been suspicion for severe pulmonary hypertension based on dilated and dysfunctional RV.  There was concern for group 3 PH but recent high resolution chest CT in 2/21 does not show marked lung abnormality (there does not seem to  be severe emphysema or interstitial lung disease). It is possible that she has had group 1 PH (or group 2 PH if mitral stenosis is worse than it appears by echo doppler), but will need RHC/hemodynamic LHC for formal diagnosis.  3. Mitral stenosis: The valve looks severely calcified, but hemodynamics by echo are not that impressive.  Mean gradient 5 mmHg and MVA 2.66  cm^2 by PHT suggest no more than mild mitral stenosis.   - Would like to confirm severity of MS when cath is done (would require right and left heart cath to measure MV gradient invasively).  - If she has moderate to severe MS, she would not be a good candidate for valve replacement.  4. Atrial flutter: The patient appears to have been in atrial flutter since at least 12/20.  Mild RVR.   - Off atenolol with AKI.  - Control HR with amiodarone gtt while on inotrope.  - Consider eventual DCCV, but would need to be after she titrates off dobutamine as she would be less likely to hold NSR on dobutamine.  - Continue apixaban for now, will hold prior to cath (change over to heparin on Sunday).  5. Acute on CKD stage 3: Creatinine peaked at 4.3. Todays creatinine is down to 2.55. Suspect cardiorenal syndrome in setting of RV failure.  Hopefully will continue to improve with effective diuresis and lowering of renal venous pressure.  She would be a poor candidate for long-term HD with RV failure.  6. Chronic hypoxemic respiratory failure:  She has a history of smoking, but high resolution CT lungs does not show clear evidence of severe COPD or of interstitial lung disease.  It is possible that she has group 1 pulmonary HTN as cause of oxygen requirement.  7. Hyponatremia: Sodium up to 130 with tolvaptan dose on 3/11.  Fluid restrict.    Length of Stay: [place], MD  03/22/2019, 8:47 AM  Advanced Heart Failure Team Pager [phone] (M-F; 7a - 4p)  Please contact [facility] for night-coverage after hours (4p -7a ) and weekends on  [email or web address]

## 2019-03-22 NOTE — Progress Notes (Signed)
Subjective:  3400 UOP- weight down 6.7 pounds. crt down again - one recorded BP at 88/61 but others above 003 systolic    Objective Vital signs in last 24 hours: Vitals:   03/22/19 0539 03/22/19 0540 03/22/19 0541 03/22/19 0542  BP:   111/68   Pulse: 95 93 93 94  Resp: _0 Temp:      TempSrc:      SpO2: 94% 92% 92% 93%  Weight:    91.2 kg  Height:       Weight change: -3.039 kg  Intake/Output Summary (Last 24 hours) at 03/22/2019 0835 Last data filed at 03/22/2019 0827 Gross per 24 hour  Intake 624.25 ml  Output 4450 ml  Net -3825.75 ml    Assessment/Plan: 74 year old white female with multiple medical issues.  Creatinine 1.07 in December 2020.  Has developed a change in renal function in association with symptoms consistent with decompensated right-sided heart failure.  Kidney function worsened in the setting of hospitalization, management of atrial fibrillation with hypotension 1.Renal-acute on chronic renal failure in the setting of above.  Renal ultrasound does not show obstruction but does show some chronicity of disease. U/A with 30 of protein and no cells so bland.   Therefore, I suspect all of the kidney injury is hemodynamic in nature, mostly associated with hypotension.  She was on an ACE inhibitor prior to admission that has been held.  Fortunately, she is nonoliguric and renal function is improved a lot in the last 24 hours.  There are no acute indications for dialysis at this time.  As a side note, given her multiple medical issues and her cardiac function I do not think that she would do well on dialysis and would probably advise against it 2. Hypertension/volume  -she is overloaded.  Blood pressure is soft.   atenolol 50 has been stopped.   I think that her right-sided heart failure is the key here.  I am not sure how much reversibility there is to her cardiac dynamics.  Appreciate cardiology input.   Needs diuresis.  Concerned about dropping blood pressure.  Cardiology has now placed on 120 q 8 along with metolazone-  seeing a great response- I will let them continue to determine 3. Anemia  -she is anemic which is not helping.   iron stores low- will replete, give first dose of feraheme 3/11-  and have started [place]    Labs: Basic Metabolic Panel: Recent Labs  Lab 03/20/19 0410 03/20/19 0410 03/21/19 0630 03/21/19 1722 03/22/19 0439  NA 127*   < > 126* 127* 130*  K 5.1  --  4.6  --  4.0  CL 93*  --  93*  --  93*  CO2 18*  --  20*  --  23  GLUCOSE 141*  --  180*  --  134*  BUN 61*  --  66*  --  59*  CREATININE 4.27*  --  3.79*  --  2.55*  CALCIUM 8.7*  --  8.9  --  9.7  PHOS  --   --  6.6*  --  5.5*   < > = values in this interval not displayed.   Liver Function Tests: Recent Labs  Lab 03/17/19 2212 03/17/19 2212 03/18/19 0505 03/21/19 0630 03/22/19 0439  AST 28  --  30  --   --   ALT 20  --  18  --   --   [facility]  63  --  62  --   --   BILITOT 0.6  --  0.6  --   --   PROT 7.2  --  7.1  --   --   ALBUMIN 3.8   < > 3.7 3.1* 3.2*   < > = values in this interval not displayed.   No results for input(s): LIPASE, AMYLASE in the last 168 hours. No results for input(s): AMMONIA in the last 168 hours. CBC: Recent Labs  Lab 03/17/19 2212 03/17/19 2212 03/18/19 0505 03/18/19 0505 03/20/19 1248 03/21/19 0630 03/22/19 0439  WBC 10.8*   < > 9.7   < > 8.4 6.3 5.4  NEUTROABS 9.5*  --   --   --   --   --   --   HGB 9.4*   < > 9.5*   < > 9.5* 8.9* 8.7*  HCT 30.3*   < > 31.5*   < > 28.8* 27.9* 27.0*  MCV 97.4  --  99.1  --  93.2 94.3 95.1  PLT 176   < > 192   < > 168 154 153   < > = values in this interval not displayed.   Cardiac Enzymes: No results for input(s): CKTOTAL, CKMB, CKMBINDEX, TROPONINI in the last 168 hours. CBG: Recent Labs  Lab 03/21/19 1109 03/21/19 1617 03/21/19 2037 03/22/19 0543 03/22/19 0629  GLUCAP 227* 203* 177* 99 122*    Iron Studies:  Recent Labs     03/21/19 0630  IRON 45  TIBC 473*  FERRITIN 39   Studies/Results: US RENAL  Result Date: 03/20/2019 CLINICAL DATA:  Acute kidney injury EXAM: RENAL / URINARY TRACT ULTRASOUND COMPLETE COMPARISON:  None. FINDINGS: Right Kidney: Renal measurements: 12.5 x 4.9 x 6.5 cm = volume: 209 mL. Cortical thinning. Normal echotexture. No mass or hydronephrosis. Left Kidney: Renal measurements: 12.0 x 5.6 x 6.5 cm = volume: 228 mL. Cortical thinning. Normal echotexture. No mass or hydronephrosis. Bladder: Not visualized, decompressed Other: None. IMPRESSION: Cortical thinning bilaterally. No acute findings. No hydronephrosis. Electronically Signed   By: Rolm Baptise M.D.   On: 03/20/2019 10:12   DG CHEST PORT 1 VIEW  Result Date: 03/20/2019 CLINICAL DATA:  PICC line placement EXAM: PORTABLE CHEST 1 VIEW COMPARISON:  March 20, 2019 FINDINGS: The PICC line terminates in the region of the SVC. The heart size remains enlarged. There are small bilateral pleural effusions with adjacent airspace disease favored to represent atelectasis. There is no pneumothorax. No acute osseous abnormality. Aortic calcifications are noted. IMPRESSION: 1. Right-sided PICC line as above. 2. Again noted are findings suggestive of congestive heart failure. Electronically Signed   By: Constance Holster M.D.   On: 03/20/2019 20:15   DG Chest Port 1 View  Result Date: 03/20/2019 CLINICAL DATA:  Shortness of breath. EXAM: PORTABLE CHEST 1 VIEW COMPARISON:  Ultrasound 03/17/2019. FINDINGS: Cardiomegaly with bilateral pulmonary interstitial prominence and bilateral costophrenic angle blunting. Findings suggest congestive heart failure with interstitial edema and small bilateral pleural effusions. Pneumonitis cannot be excluded. Known pneumothorax. IMPRESSION: Findings suggest congestive heart failure bilateral interstitial edema small bilateral pleural effusions. Electronically Signed   By: Marcello Moores  Register   On: 03/20/2019 14:49   [place] EKG  SITE RITE  Result Date: 03/20/2019 If Site Rite image not attached, placement could not be confirmed due to current cardiac rhythm.  Medications: Infusions: . sodium chloride    . sodium chloride    . amiodarone 30 mg/hr (03/22/19 0728)  . DOBUTamine  7.5 mcg/kg/min (03/22/19 0523)  . furosemide 120 mg (03/22/19 0525)  . magnesium sulfate bolus IVPB      Scheduled Medications: . apixaban  5 mg Oral BID  . atorvastatin  40 mg Oral Daily  . Chlorhexidine Gluconate Cloth  6 each Topical Daily  . darbepoetin (ARANESP) injection - NON-DIALYSIS  60 mcg Subcutaneous Q Wed-1800  . fluticasone furoate-vilanterol  1 puff Inhalation Daily  . insulin aspart  0-5 Units Subcutaneous QHS  . insulin aspart  0-9 Units Subcutaneous TID WC  . insulin detemir  30 Units Subcutaneous BID  . mouth rinse  15 mL Mouth Rinse BID  . pantoprazole  40 mg Oral Daily  . sertraline  100 mg Oral Daily  . sodium chloride flush  10-40 mL Intracatheter Q12H  . sodium chloride flush  3 mL Intravenous Q12H    have reviewed scheduled and prn medications.  Physical Exam: General: sitting up-  Looks much better, joking  Heart: RRR Lungs: really pretty clear-  Poor expansion Abdomen: soft, non tender Extremities: pitting edema but better    03/22/2019,8:35 AM  LOS: 4 days

## 2019-03-22 NOTE — Care Management Important Message (Signed)
Important Message  Patient Details  Name: Ellen Jordan MRN: [id number] Date of Birth: 12-31-1945   Medicare Important Message Given:  Yes     Shelda Altes 03/22/2019, 9:37 AM

## 2019-03-23 DIAGNOSIS — I5033 Acute on chronic diastolic (congestive) heart failure: Secondary | ICD-10-CM

## 2019-03-23 LAB — GLUCOSE, CAPILLARY
Glucose-Capillary: 79 mg/dL (ref 70–99)
Glucose-Capillary: 225 mg/dL — ABNORMAL HIGH (ref 70–99)
Glucose-Capillary: 163 mg/dL — ABNORMAL HIGH (ref 70–99)
Glucose-Capillary: 169 mg/dL — ABNORMAL HIGH (ref 70–99)

## 2019-03-23 LAB — CBC
HCT: 27.6 % — ABNORMAL LOW (ref 36.0–46.0)
Hemoglobin: 9.1 g/dL — ABNORMAL LOW (ref 12.0–15.0)
MCH: 31.1 pg (ref 26.0–34.0)
MCHC: 33 g/dL (ref 30.0–36.0)
MCV: 94.2 fL (ref 80.0–100.0)
Platelets: 163 10*3/uL (ref 150–400)
RBC: 2.93 MIL/uL — ABNORMAL LOW (ref 3.87–5.11)
RDW: 15.5 % (ref 11.5–15.5)
WBC: 5.4 10*3/uL (ref 4.0–10.5)
nRBC: 0 % (ref 0.0–0.2)

## 2019-03-23 LAB — RENAL FUNCTION PANEL
Albumin: 3.4 g/dL — ABNORMAL LOW (ref 3.5–5.0)
Anion gap: 12 (ref 5–15)
BUN: 49 mg/dL — ABNORMAL HIGH (ref 8–23)
CO2: 29 mmol/L (ref 22–32)
Calcium: 10.1 mg/dL (ref 8.9–10.3)
Chloride: 96 mmol/L — ABNORMAL LOW (ref 98–111)
Creatinine, Ser: 1.88 mg/dL — ABNORMAL HIGH (ref 0.44–1.00)
GFR calc Af Amer: 30 mL/min — ABNORMAL LOW (ref >60)
GFR calc non Af Amer: 26 mL/min — ABNORMAL LOW (ref >60)
Glucose, Bld: 79 mg/dL (ref 70–99)
Phosphorus: 4.4 mg/dL (ref 2.5–4.6)
Potassium: 3.4 mmol/L — ABNORMAL LOW (ref 3.5–5.1)
Sodium: 137 mmol/L (ref 135–145)

## 2019-03-23 LAB — MAGNESIUM: Magnesium: 1.6 mg/dL — ABNORMAL LOW (ref 1.7–2.4)

## 2019-03-23 LAB — COOXEMETRY PANEL
Carboxyhemoglobin: 1.6 % — ABNORMAL HIGH (ref 0.5–1.5)
Methemoglobin: 0.9 % (ref 0.0–1.5)
O2 Saturation: 62.2 %
Total hemoglobin: 13.8 g/dL (ref 12.0–16.0)

## 2019-03-23 MED ORDER — ZINC OXIDE 40 % EX OINT
TOPICAL_OINTMENT | Freq: Two times a day (BID) | CUTANEOUS | Status: DC | PRN
  Filled 2019-03-23: qty 57

## 2019-03-23 MED ORDER — RAMELTEON 8 MG PO TABS
8.0000 mg | ORAL_TABLET | Freq: Every day | ORAL | Status: DC
  Administered 2019-03-23 – 2019-03-27 (×5): 8 mg via ORAL
  Filled 2019-03-23 (×6): qty 1

## 2019-03-23 MED ORDER — BARRIER CREAM NON-SPECIFIED: 1.0000 "application " | TOPICAL_CREAM | Freq: Two times a day (BID) | TOPICAL | Status: DC | PRN

## 2019-03-23 MED ORDER — POTASSIUM CHLORIDE CRYS ER 20 MEQ PO TBCR
40.0000 meq | EXTENDED_RELEASE_TABLET | ORAL | Status: AC
  Administered 2019-03-23 (×2): 40 meq via ORAL
  Filled 2019-03-23 (×2): qty 2

## 2019-03-23 MED ORDER — MAGNESIUM SULFATE 2 GM/50ML IV SOLN
2.0000 g | Freq: Once | INTRAVENOUS | Status: AC
  Administered 2019-03-23: 2 g via INTRAVENOUS
  Filled 2019-03-23: qty 50

## 2019-03-23 MED ORDER — SODIUM CHLORIDE 0.9 % IV SOLN
510.0000 mg | Freq: Once | INTRAVENOUS | Status: AC
  Administered 2019-03-24: 510 mg via INTRAVENOUS
  Filled 2019-03-23: qty 17

## 2019-03-23 MED ORDER — LORATADINE 10 MG PO TABS
10.0000 mg | ORAL_TABLET | Freq: Every day | ORAL | Status: DC
  Administered 2019-03-23 – 2019-03-28 (×6): 10 mg via ORAL
  Filled 2019-03-23 (×6): qty 1

## 2019-03-23 MED ORDER — CAMPHOR-MENTHOL 0.5-0.5 % EX LOTN
TOPICAL_LOTION | CUTANEOUS | Status: DC | PRN
  Filled 2019-03-23: qty 222

## 2019-03-23 MED ORDER — FLUTICASONE PROPIONATE 50 MCG/ACT NA SUSP
2.0000 | Freq: Every day | NASAL | Status: DC
  Administered 2019-03-23 – 2019-03-28 (×6): 2 via NASAL
  Filled 2019-03-23 (×2): qty 16

## 2019-03-23 NOTE — Progress Notes (Signed)
PROGRESS NOTE  Ellen Jordan LYY:[id number] DOB: 10-20-1945   PCP: [name], MD  Patient is from: Home.  Lives with her husband.  Recently started using walker due to recurrent fall and weakness.  DOA: 03/17/2019 LOS: 5  Brief Narrative / Interim history: 74 year old female with history of COPD on 2 L, ILD, A. fib on Eliquis, diastolic CHF, Pulm HTN, DM-2, CKD-3B, HTN and obesity presented to [facility] with progressive shortness of breath and rectal bleed and admitted for acute on chronic renal failure and acute on chronic diastolic CHF.   At [facility] ED, Cr 2.99 (baseline about 1.4).  CXR with mild pulmonary vascular congestion.  Cardiology consulted there but not able to diurese patient due to worsening renal function.  She was transferred to [facility] for nephrology input.  Nephrology, advanced heart failure team, PCCM and palliative care consulted.  Started on high-dose IV Lasix with dobutamine.  Subjective: No major events overnight or this morning.  Feels some ear popping from blowing her nose frequently.  Feels tired due to lack of sleep here.  Denies chest pain.  Breathing improved.  Denies GI or UTI symptoms.  Had about 5.4 L urine output in the last 24 hours.  Creatinine improved tremendously.  Remains on dobutamine and high-dose Lasix.  Objective: Vitals:   03/23/19 0400 03/23/19 0738 03/23/19 1100 03/23/19 1211  BP: 125/63     Pulse:    (!) 105  Resp: 15  17   Temp: 98.1 F (36.7 C)   98.1 F (36.7 C)  TempSrc: Oral   Oral  SpO2: 93% 94%    Weight: 85.9 kg     Height:        Intake/Output Summary (Last 24 hours) at 03/23/2019 1441 Last data filed at 03/23/2019 1400 Gross per 24 hour  Intake 480 ml  Output 5600 ml  Net -5120 ml   Filed Weights   03/21/19 0604 03/22/19 0542 03/23/19 0400  Weight: 94.3 kg 91.2 kg 85.9 kg    Examination:  GENERAL: No acute distress.  Appears well.  HEENT: MMM.  Vision and hearing grossly intact.  NECK: Supple.   No apparent JVD.  RESP: 93% on 1 L by [place] at rest.  No IWOB.  Fair aeration bilaterally. CVS:  RRR. Heart sounds normal.  ABD/GI/GU: Bowel sounds present. Soft. Non tender.  MSK/EXT:  Moves extremities.  Unna boots over BLE. SKIN: no apparent skin lesion or wound NEURO: Awake, alert and oriented appropriately.  No apparent focal neuro deficit. PSYCH: Calm. Normal affect. Procedures:  None  Assessment & Plan: Acute on chronic diastolic CHF/RV failure/Pulm HTN: Echo with EF of 60 to 65%, indeterminate diastolic function and interventricular septum flattening in systole and diastole consistent with right ventricular pressure and volume overload.  Likely cor pulmonale from underlying lung disease.  She has no significant crackles on exam.  Has progressive dyspnea and DOE but good saturation on 2 L by [place].  BNP elevated but no recent values to compare to.  She is on torsemide at home.  Responding to dobutamine and IV Lasix.  About 5.4 L UOP in the last 24 hours.  Renal function improving. -Advanced HF team managing-on dobutamine 7.5 and IV Lasix 120 mg 3 times daily.  No metolazone today. -Continue monitoring fluid status, renal function and electrolytes. -Sodium and fluid restrictions. -Possibly R/LHC on 3/15  Mitral valve stenosis -Plan for further evaluation with heart cath when stable -Per cardiology-not a good candidate for  valve replacement  Atrial flutter with wide-QRS and QTc-HR fairly controlled. QT could be exaggerated by wide QRS due to RBBB. -Cardiology managing-flecainide, Cardizem and atenolol discontinued. -Started on amiodarone drip while on inotropes. -On Eliquis for anticoagulation. -Optimize electrolytes -Possible DCCV down the road when off dobutamine.  AKI on CKD-3B/azotemia: Cr 1.4 (on 02/14/2019)> 2.99 (admit)>> 4.37> 4.27> 3.79> 2.55> 1.88.  BUN 44>> 66> 59> 49.  Suspect prerenal etiology from hypotension and heart failure.  She is also on torsemide and lisinopril at home  which could contribute. FENa 1.3% suggesting intrinsic etiology which could happen after initial prerenal insult.  Renal US-CKD, no Hydro.  UA negative.  Renal function improving with diuretics and inotropes. -Not a candidate for HD due to RV failure per nephrology -Continue monitoring  Progressive dyspnea/DOE-mainly due to CHF and pulmonary hypertension at this time.  Has a history of COPD.  Also some concern about ILD but  high-resolution CT on 03/01/2019 didn't confirm ILD. -Treat treatable causes as above -Wean oxygen to maintain saturation above 90% -Appreciate guidance by PCCM  Chronic COPD/respiratory failure on 2 L by nasal cannula at baseline. -Continue Breo Ellipta, duo nebs and supplemental oxygen -Appreciate insight by PCCM  Controlled DM-2 with hyperglycemia: A1c 6.7%.  On Levemir 15 units nightly and NovoLog 10 to 16 units 3 times daily at home. Recent Labs    03/22/19 2115 03/23/19 0600 03/23/19 1200  GLUCAP 187* 79 225*  -Continue Levemir to 30 units twice daily, SSI-thin and a statin  Normocytic anemia: Baseline Hgb 9-10> 9.4 (admit)> 9.5> 8.9> 8.7> 9.1.  Iron sat 10%.  TIBC 473.  Mention of rectal bleed on presentation.  Husband thinks she might have wiped herself hard.  She is on Eliquis. -Continue monitoring -IV iron and ESA per nephrology.  Anion gap metabolic acidosis: Likely due to azotemia/renal failure.  Resolved.  Hypervolemic hyponatremia-likely due to CHF or renal failure.  Resolved. -Received tolvaptan once on 3/11.  Anxiety and depression/insomnia: Stable -Continue Zoloft and Xanax. -Add ramelteon at night.  Allergic rhinitis -Flonase and loratadine  Hyperlipidemia -Fasting lipid panel in the morning -continue statins  GERD -continue protonix  Class II obesity: Body mass index is 33.53 kg/m.  Weight improved after diuretics. -Encourage lifestyle change to lose weight -May benefit from newer agents with cardiovascular benefits  At risk  for polypharmacy-noted multiple sedating medications including Benadryl, chlorpheniramine -Encouraged to avoid OTC medications  Pulmonary nodules: Noted on high-resolution CT on 02/19/2019 -Follow-up CT in 12 months as recommended.  Liver cirrhosis?  Noted on high-resolution CT last months.  Debility/Generalized weakness: recently started using walker recently -PT/OT-recommended SNF but she wished to go home with home hospice when ready  Goal of care: DNR/DNI appropriately.  Multiple comorbidities as above.  Not a candidate for dialysis. -Palliative care consulted.               DVT prophylaxis: On Eliquis for A. fib Code Status: DNR/DNI Family Communication: Patient and RN.  No family member at bedside today.  Discharge barrier: Evaluation and treatment of acute renal failure and decompensated CHF.  On inotropes and IV diuretics Patient is from: Home Final disposition: Likely home with home hospice when medically stable and cleared by cardiology.  Consultants:  nephrology (off), PCCM (off), advanced HF team and palliative care   Microbiology summarized: COVID-19 screen negative.  Sch Meds:  Scheduled Meds: . acetaminophen  1,000 mg Oral Q8H  . apixaban  5 mg Oral BID  . atorvastatin  40 mg  Oral Daily  . Chlorhexidine Gluconate Cloth  6 each Topical Daily  . darbepoetin (ARANESP) injection - NON-DIALYSIS  60 mcg Subcutaneous Q Wed-1800  . fluticasone  2 spray Each Nare Daily  . fluticasone furoate-vilanterol  1 puff Inhalation Daily  . insulin aspart  0-5 Units Subcutaneous QHS  . insulin aspart  0-9 Units Subcutaneous TID WC  . insulin detemir  30 Units Subcutaneous BID  . loratadine  10 mg Oral Daily  . mouth rinse  15 mL Mouth Rinse BID  . pantoprazole  40 mg Oral Daily  . ramelteon  8 mg Oral QHS  . sertraline  100 mg Oral Daily  . sodium chloride flush  10-40 mL Intracatheter Q12H  . sodium chloride flush  3 mL Intravenous Q12H  . sodium chloride flush  3 mL  Intravenous Q12H   Continuous Infusions: . sodium chloride    . sodium chloride 250 mL (03/22/19 1840)  . amiodarone 30 mg/hr (03/23/19 0719)  . DOBUTamine 7.5 mcg/kg/min (03/23/19 4098)  . [START ON 03/24/2019] ferumoxytol    . furosemide 120 mg (03/23/19 1257)   PRN Meds:.sodium chloride, sodium chloride, albuterol, ALPRAZolam, ipratropium-albuterol, loperamide, sodium chloride flush, sodium chloride flush  Antimicrobials: Anti-infectives (From admission, onward)   None       I have personally reviewed the following labs and images: CBC: Recent Labs  Lab 03/17/19 2212 03/17/19 2212 03/18/19 0505 03/20/19 1248 03/21/19 0630 03/22/19 0439 03/23/19 0542  WBC 10.8*   < > 9.7 8.4 6.3 5.4 5.4  NEUTROABS 9.5*  --   --   --   --   --   --   HGB 9.4*   < > 9.5* 9.5* 8.9* 8.7* 9.1*  HCT 30.3*   < > 31.5* 28.8* 27.9* 27.0* 27.6*  MCV 97.4   < > 99.1 93.2 94.3 95.1 94.2  PLT 176   < > 192 168 154 153 163   < > = values in this interval not displayed.   BMP &GFR Recent Labs  Lab 03/19/19 0951 03/19/19 0951 03/20/19 0410 03/21/19 0630 03/21/19 1722 03/22/19 0439 03/23/19 0542  NA 126*   < > 127* 126* 127* 130* 137  K 4.4  --  5.1 4.6  --  4.0 3.4*  CL 92*  --  93* 93*  --  93* 96*  CO2 20*  --  18* 20*  --  23 29  GLUCOSE 218*  --  141* 180*  --  134* 79  BUN 59*  --  61* 66*  --  59* 49*  CREATININE 4.37*  --  4.27* 3.79*  --  2.55* 1.88*  CALCIUM 8.4*  --  8.7* 8.9  --  9.7 10.1  MG  --   --   --  1.8  --  1.8 1.6*  PHOS  --   --   --  6.6*  --  5.5* 4.4   < > = values in this interval not displayed.   Estimated Creatinine Clearance: 26.9 mL/min (A) (by C-G formula based on SCr of 1.88 mg/dL (H)). Liver & Pancreas: Recent Labs  Lab 03/17/19 2212 03/18/19 0505 03/21/19 0630 03/22/19 0439 03/23/19 0542  AST 28 30  --   --   --   ALT 20 18  --   --   --   ALKPHOS 63 62  --   --   --   BILITOT 0.6 0.6  --   --   --  PROT 7.2 7.1  --   --   --   ALBUMIN 3.8  3.7 3.1* 3.2* 3.4*   No results for input(s): LIPASE, AMYLASE in the last 168 hours. No results for input(s): AMMONIA in the last 168 hours. Diabetic: No results for input(s): HGBA1C in the last 72 hours. Recent Labs  Lab 03/22/19 1148 03/22/19 1846 03/22/19 2115 03/23/19 0600 03/23/19 1200  GLUCAP 148* 234* 187* 79 225*   Cardiac Enzymes: No results for input(s): CKTOTAL, CKMB, CKMBINDEX, TROPONINI in the last 168 hours. Recent Labs    09/12/18 1201  PROBNP 51.0   Coagulation Profile: No results for input(s): INR, PROTIME in the last 168 hours. Thyroid Function Tests: No results for input(s): TSH, T4TOTAL, FREET4, T3FREE, THYROIDAB in the last 72 hours. Lipid Profile: Recent Labs    03/21/19 0605  CHOL 90  HDL 22*  LDLCALC 44  TRIG 118  CHOLHDL 4.1   Anemia Panel: Recent Labs    03/21/19 0630  FERRITIN 39  TIBC 473*  IRON 45   Urine analysis:    Component Value Date/Time   COLORURINE YELLOW 03/20/2019 2030   [place] 03/20/2019 2030   LABSPEC 1.010 03/20/2019 2030   [place] 5.0 03/20/2019 2030   GLUCOSEU NEGATIVE 03/20/2019 2030   [place] NEGATIVE 03/20/2019 2030   [place] NEGATIVE 03/20/2019 2030   [place] 03/20/2019 2030   PROTEINUR 30 (A) 03/20/2019 2030   NITRITE NEGATIVE 03/20/2019 2030   LEUKOCYTESUR SMALL (A) 03/20/2019 2030   Sepsis Labs: Invalid input(s): PROCALCITONIN, [place]  Microbiology: Recent Results (from the past 240 hour(s))  SARS CORONAVIRUS 2 (TAT 6-24 HRS) Nasopharyngeal Nasopharyngeal Swab     Status: None   Collection Time: 03/18/19 12:10 AM   Specimen: Nasopharyngeal Swab  Result Value Ref Range Status   SARS Coronavirus 2 NEGATIVE NEGATIVE Final    Comment: (NOTE) SARS-CoV-2 target nucleic acids are NOT DETECTED. The SARS-CoV-2 RNA is generally detectable in upper and lower respiratory specimens during the acute phase of infection. Negative results do not preclude SARS-CoV-2 infection, do  not rule out co-infections with other pathogens, and should not be used as the sole basis for treatment or other patient management decisions. Negative results must be combined with clinical observations, patient history, and epidemiological information. The expected result is Negative. Fact Sheet for Patients: SugarRoll.be Fact Sheet for Healthcare Providers: https://www.woods-mathews.com/ This test is not yet approved or cleared by the [place] FDA and  has been authorized for detection and/or diagnosis of SARS-CoV-2 by FDA under an Emergency Use Authorization (EUA). This EUA will remain  in effect (meaning this test can be used) for the duration of the COVID-19 declaration under Section 56 4(b)(1) of the Act, 21 U.S.C. section 360bbb-3(b)(1), unless the authorization is terminated or revoked sooner. Performed at [facility], [place] [address], [place], [place] [address]     Radiology Studies: No results found.  Rajinder Mesick T. [place]  If 7PM-7AM, please contact night-coverage [email or web address] Password [facility] 03/23/2019, 2:41 PM

## 2019-03-23 NOTE — Progress Notes (Signed)
Patient ID: Ellen Jordan, female   DOB: 1945/01/25, 74 y.o.   MRN: [id number]     Advanced Heart Failure Rounding Note  PCP-Cardiologist: Rozann Lesches, MD   Subjective:    Now on dobutamine 7.5 mcg/kg/min with co-ox 62%.  Creatinine trending down, 1.88 today.  Good UOP yesterday, weight down again.  CVP 15-16 today.   Starting to feel better.   She remains in atrial fibrillation, rate around 110 on amiodarone gtt.     Objective:   Weight Range: 85.9 kg Body mass index is 33.53 kg/m.   Vital Signs:   Temp:  [97.5 F (36.4 C)-98.1 F (36.7 C)] 98.1 F (36.7 C) (03/13 0400) Pulse Rate:  [91-92] 91 (03/12 1154) Resp:  [15-25] 15 (03/13 0400) BP: (117-152)/(50-80) 125/63 (03/13 0400) SpO2:  [93 %-95 %] 94 % (03/13 0738) Weight:  [85.9 kg] 85.9 kg (03/13 0400) Last BM Date: 03/22/19  Weight change: Filed Weights   03/21/19 0604 03/22/19 0542 03/23/19 0400  Weight: 94.3 kg 91.2 kg 85.9 kg    Intake/Output:   Intake/Output Summary (Last 24 hours) at 03/23/2019 0751 Last data filed at 03/23/2019 0646 Gross per 24 hour  Intake 477.06 ml  Output 5350 ml  Net -4872.94 ml      Physical Exam   CVP 15-16 General: NAD Neck: JVP 12-14, no thyromegaly or thyroid nodule.  Lungs: Clear to auscultation bilaterally with normal respiratory effort. CV: Nondisplaced PMI.  Heart irregular S1/S2, no S3/S4, no murmur.  1+ edema to knees.   Abdomen: Soft, nontender, no hepatosplenomegaly, no distention.  Skin: Intact without lesions or rashes.  Neurologic: Alert and oriented x 3.  Psych: Normal affect. Extremities: No clubbing or cyanosis.  HEENT: Normal.   Telemetry  Atrial fibrillation 100s-110s  EKG   n/a   Labs    CBC Recent Labs    03/22/19 0439 03/23/19 0542  WBC 5.4 5.4  HGB 8.7* 9.1*  HCT 27.0* 27.6*  MCV 95.1 94.2  PLT 153 809   Basic Metabolic Panel Recent Labs    03/22/19 0439 03/23/19 0542  NA 130* 137  K 4.0 3.4*  CL 93* 96*  CO2 23 29   GLUCOSE 134* 79  BUN 59* 49*  CREATININE 2.55* 1.88*  CALCIUM 9.7 10.1  MG 1.8 1.6*  PHOS 5.5* 4.4   Liver Function Tests Recent Labs    03/22/19 0439 03/23/19 0542  ALBUMIN 3.2* 3.4*   No results for input(s): LIPASE, AMYLASE in the last 72 hours. Cardiac Enzymes No results for input(s): CKTOTAL, CKMB, CKMBINDEX, TROPONINI in the last 72 hours.  BNP: BNP (last 3 results) Recent Labs    03/17/19 2213  BNP 338.0*    ProBNP (last 3 results) Recent Labs    09/12/18 1201  PROBNP 51.0     D-Dimer No results for input(s): DDIMER in the last 72 hours. Hemoglobin A1C No results for input(s): HGBA1C in the last 72 hours. Fasting Lipid Panel Recent Labs    03/21/19 0605  CHOL 90  HDL 22*  LDLCALC 44  TRIG 118  CHOLHDL 4.1   Thyroid Function Tests No results for input(s): TSH, T4TOTAL, T3FREE, THYROIDAB in the last 72 hours.  Invalid input(s): FREET3  Other results:   Imaging    No results found.   Medications:     Scheduled Medications: . acetaminophen  1,000 mg Oral Q8H  . apixaban  5 mg Oral BID  . atorvastatin  40 mg Oral Daily  . Chlorhexidine Gluconate  Cloth  6 each Topical Daily  . darbepoetin (ARANESP) injection - NON-DIALYSIS  60 mcg Subcutaneous Q Wed-1800  . fluticasone furoate-vilanterol  1 puff Inhalation Daily  . insulin aspart  0-5 Units Subcutaneous QHS  . insulin aspart  0-9 Units Subcutaneous TID WC  . insulin detemir  30 Units Subcutaneous BID  . mouth rinse  15 mL Mouth Rinse BID  . pantoprazole  40 mg Oral Daily  . potassium chloride  40 mEq Oral Q4H  . sertraline  100 mg Oral Daily  . sodium chloride flush  10-40 mL Intracatheter Q12H  . sodium chloride flush  3 mL Intravenous Q12H  . sodium chloride flush  3 mL Intravenous Q12H    Infusions: . sodium chloride    . sodium chloride 250 mL (03/22/19 1840)  . amiodarone 30 mg/hr (03/23/19 0719)  . DOBUTamine 7.5 mcg/kg/min (03/23/19 6195)  . [START ON 03/24/2019]  ferumoxytol    . furosemide 120 mg (03/23/19 0541)  . magnesium sulfate bolus IVPB      PRN Medications: sodium chloride, sodium chloride, albuterol, ALPRAZolam, ipratropium-albuterol, loperamide, sodium chloride flush, sodium chloride flush   Assessment/Plan   1. RV failure: Echo this admission shows LV EF 65-70% with RV moderate-severely dilated and moderate dysfunction, D-shaped septum, the mitral valve is severely calcified though mean gradient is only about 5 mmHg suggesting mild to moderate mitral stenosis (pressure half-time does not appear short), mild AS.  The patient is markedly volume overloaded.  I am concerned that she has end-stage RV dysfunction from pulmonary hypertension.  She also has mitral stenosis which visually looks significant but measures only mild-moderate by mean gradient and pressure half-time.  She has been oliguric with creatinine up to 4.27 => down to 2.55 today on dobutamine.  We are trying inotropes empirically to see if we can improve RV function enough to achieve diuresis.  Given her markedly low GFR and soft BP at times,  She was started on dobutamine 3 mcg/kg/min, now has been increased to 7.5 with improved diuresis and creatinine coming down.  Co-ox 62% today with CVP 15-16.  - Continue dobutamine 7.5.  - Continue Lasix 120 mg IV every 8 hrs for at least 1 more day.  I will not give metolazone today.  - RHC/LHC (hemodynamics only to assess mitral stenosis) on Monday.   - Suspect prognosis is overall poor with severe RV failure.  Not certain we will be able to maintain improvement after weaning her off dobutamine.  2. Pulmonary hypertension: PASP estimated at 41 mmHg on echo this admission and 42 mmHg on echo in 2/20.  However, there has been suspicion for severe pulmonary hypertension based on dilated and dysfunctional RV.  There was concern for group 3 PH but recent high resolution chest CT in 2/21 does not show marked lung abnormality (there does not seem to be  severe emphysema or interstitial lung disease). It is possible that she has had group 1 PH (or group 2 PH if mitral stenosis is worse than it appears by echo doppler), but will need RHC/hemodynamic LHC for formal diagnosis.  3. Mitral stenosis: The valve looks severely calcified, but hemodynamics by echo are not that impressive.  Mean gradient 5 mmHg and MVA 2.66 cm^2 by PHT suggest no more than mild mitral stenosis.   - Would like to confirm severity of MS when cath is done (would require right and left heart cath to measure MV gradient invasively).  - If she has moderate to severe  MS, she would not be a good candidate for valve replacement.  4. Atrial flutter: The patient appears to have been in atrial flutter since at least 12/20.  Mild RVR.   - Off atenolol with AKI.  - Control HR with amiodarone gtt while on inotrope.  - Would like to do eventual TEE/DCCV, but would need to be after she titrates off dobutamine as she would be less likely to hold NSR on dobutamine.  - Continue apixaban for now, will hold prior to cath (change over to heparin on Sunday).  5. Acute on CKD stage 3: Creatinine peaked at 4.3. Todays creatinine is down to 2.55. Suspect cardiorenal syndrome in setting of RV failure.  Hopefully will continue to improve with effective diuresis and lowering of renal venous pressure.  She would be a poor candidate for long-term HD with RV failure.  6. Chronic hypoxemic respiratory failure:  She has a history of smoking, but high resolution CT lungs does not show clear evidence of severe COPD or of interstitial lung disease.  It is possible that she has group 1 pulmonary HTN as cause of oxygen requirement.  7. Hyponatremia: Sodium up to 137 with tolvaptan dose on 3/11.  Fluid restrict.    Mobilize out of bed with PT.   Length of Stay: [place], MD  03/23/2019, 7:51 AM  Advanced Heart Failure Team Pager [phone] (M-F; 7a - 4p)  Please contact [facility] for night-coverage  after hours (4p -7a ) and weekends on [email or web address]

## 2019-03-23 NOTE — Progress Notes (Signed)
Palliative.  Quick visit.  Patient complaining of insomnia, itchy rash, and having to wait for assistance to get out of bed to the [facility].   She seems to be feeling better overall today.   Provided empathetic listening.  Ordered Sarna for itch. Requested husband to bring in skin cream from home so that we can order it here.  Xanax already ordered prn insomnia.   Barrier cream for irritated skin from sitting in urine.   Patient prefers to avoid foley and Purewick.  PMT will continue to follow.   Hopeful for improvement.  Florentina Jenny, PA-C Palliative Medicine Office:  [phone]  15 min. Greater than 50%  of this time was spent counseling and coordinating care related to the above assessment and plan.

## 2019-03-23 NOTE — Progress Notes (Signed)
Subjective:  5350 UOP- weight down over 11 pounds. crt down again - BPs are higher   Objective Vital signs in last 24 hours: Vitals:   03/22/19 2053 03/22/19 2100 03/23/19 0045 03/23/19 0400  BP:   137/80 125/63  Pulse:      Resp: _0 Temp:   (!) 97.5 F (36.4 C) 98.1 F (36.7 C)  TempSrc:   Oral Oral  SpO2:   95% 93%  Weight:    85.9 kg  Height:       Weight change: -5.352 kg  Intake/Output Summary (Last 24 hours) at 03/23/2019 0709 Last data filed at 03/23/2019 0646 Gross per 24 hour  Intake 477.06 ml  Output 5350 ml  Net -4872.94 ml    Assessment/Plan: 74 year old white female with multiple medical issues.  Creatinine 1.07 in December 2020.  Has developed a change in renal function in association with symptoms consistent with decompensated right-sided heart failure.  Kidney function worsened in the setting of hospitalization, management of atrial fibrillation with hypotension 1.Renal-acute on chronic renal failure in the setting of above.  Renal ultrasound does not show obstruction - shows some chronicity of disease. U/A with 30 of protein and no cells so bland.    I suspect all of the kidney injury is hemodynamic in nature, mostly associated with hypotension.  She was on an ACE inhibitor prior to admission that has been held.  Fortunately, she is nonoliguric and renal function is improved a lot since she has been here.  There are no acute indications for dialysis at this time.  As a side note, given her multiple medical issues and her cardiac function I do not think that she would do well on dialysis and would probably advise against it.  Renal function improved again 2. Hypertension/volume  -she is overloaded.  Blood pressure is soft.   atenolol 50 has been stopped.   I think that her right-sided heart failure is the key here.  I am not sure how much reversibility there is to her cardiac dynamics.  Appreciate cardiology input.  Now on dobutamine and high dose diuretics with  great response.  I would probably back off some on diuretics but will leave that to Dr. Aundra Dubin 3. Anemia  -she is anemic which is not helping.   iron stores low- will replete, give first dose of feraheme 3/11, second ordered for 3/14-  and have started ESA  Renal will sign off as AKI is improved.  Cardiology is managing diuretics.  Would keep on ESA while here but does not need to go home with it    [place]: Basic Metabolic Panel: Recent Labs  Lab 03/21/19 0630 03/21/19 0630 03/21/19 1722 03/22/19 0439 03/23/19 0542  NA 126*   < > 127* 130* 137  K 4.6  --   --  4.0 3.4*  CL 93*  --   --  93* 96*  CO2 20*  --   --  23 29  GLUCOSE 180*  --   --  134* 79  BUN 66*  --   --  59* 49*  CREATININE 3.79*  --   --  2.55* 1.88*  CALCIUM 8.9  --   --  9.7 10.1  PHOS 6.6*  --   --  5.5* 4.4   < > = values in this interval not displayed.   Liver Function Tests: Recent Labs  Lab 03/17/19 2212 03/17/19 2212 03/18/19 0505 03/18/19 0505  03/21/19 0630 03/22/19 0439 03/23/19 0542  AST 28  --  30  --   --   --   --   ALT 20  --  18  --   --   --   --   ALKPHOS 63  --  62  --   --   --   --   BILITOT 0.6  --  0.6  --   --   --   --   PROT 7.2  --  7.1  --   --   --   --   ALBUMIN 3.8   < > 3.7   < > 3.1* 3.2* 3.4*   < > = values in this interval not displayed.   No results for input(s): LIPASE, AMYLASE in the last 168 hours. No results for input(s): AMMONIA in the last 168 hours. CBC: Recent Labs  Lab 03/17/19 2212 03/17/19 2212 03/18/19 0505 03/18/19 0505 03/20/19 1248 03/20/19 1248 03/21/19 0630 03/22/19 0439 03/23/19 0542  WBC 10.8*   < > 9.7   < > 8.4   < > 6.3 5.4 5.4  NEUTROABS 9.5*  --   --   --   --   --   --   --   --   HGB 9.4*   < > 9.5*   < > 9.5*   < > 8.9* 8.7* 9.1*  HCT 30.3*   < > 31.5*   < > 28.8*   < > 27.9* 27.0* 27.6*  MCV 97.4   < > 99.1  --  93.2  --  94.3 95.1 94.2  PLT 176   < > 192   < > 168   < > 154 153 163   < > = values  in this interval not displayed.   Cardiac Enzymes: No results for input(s): CKTOTAL, CKMB, CKMBINDEX, TROPONINI in the last 168 hours. CBG: Recent Labs  Lab 03/22/19 0629 03/22/19 1148 03/22/19 1846 03/22/19 2115 03/23/19 0600  GLUCAP 122* 148* 234* 187* 79    Iron Studies:  Recent Labs    03/21/19 0630  IRON 45  TIBC 473*  FERRITIN 39   Studies/Results: No results found. Medications: Infusions: . sodium chloride    . sodium chloride 250 mL (03/22/19 1840)  . amiodarone 30 mg/hr (03/22/19 1850)  . DOBUTamine 7.5 mcg/kg/min (03/23/19 5993)  . furosemide 120 mg (03/23/19 0541)    Scheduled Medications: . acetaminophen  1,000 mg Oral Q8H  . apixaban  5 mg Oral BID  . atorvastatin  40 mg Oral Daily  . Chlorhexidine Gluconate Cloth  6 each Topical Daily  . darbepoetin (ARANESP) injection - NON-DIALYSIS  60 mcg Subcutaneous Q Wed-1800  . fluticasone furoate-vilanterol  1 puff Inhalation Daily  . insulin aspart  0-5 Units Subcutaneous QHS  . insulin aspart  0-9 Units Subcutaneous TID WC  . insulin detemir  30 Units Subcutaneous BID  . mouth rinse  15 mL Mouth Rinse BID  . pantoprazole  40 mg Oral Daily  . sertraline  100 mg Oral Daily  . sodium chloride flush  10-40 mL Intracatheter Q12H  . sodium chloride flush  3 mL Intravenous Q12H  . sodium chloride flush  3 mL Intravenous Q12H    have reviewed scheduled and prn medications.  Physical Exam: General: sitting up-  Looks much better, joking  Heart: RRR Lungs: really pretty clear-  Poor expansion Abdomen: soft, non tender Extremities:  edema but better    03/23/2019,7:09  AM  LOS: 5 days

## 2019-03-24 DIAGNOSIS — I482 Chronic atrial fibrillation, unspecified: Secondary | ICD-10-CM

## 2019-03-24 LAB — CBC
HCT: 29.9 % — ABNORMAL LOW (ref 36.0–46.0)
Hemoglobin: 9.4 g/dL — ABNORMAL LOW (ref 12.0–15.0)
MCH: 30.3 pg (ref 26.0–34.0)
MCHC: 31.4 g/dL (ref 30.0–36.0)
MCV: 96.5 fL (ref 80.0–100.0)
Platelets: 165 10*3/uL (ref 150–400)
RBC: 3.1 MIL/uL — ABNORMAL LOW (ref 3.87–5.11)
RDW: 15.8 % — ABNORMAL HIGH (ref 11.5–15.5)
WBC: 5.6 10*3/uL (ref 4.0–10.5)
nRBC: 0 % (ref 0.0–0.2)

## 2019-03-24 LAB — GLUCOSE, CAPILLARY
Glucose-Capillary: 84 mg/dL (ref 70–99)
Glucose-Capillary: 184 mg/dL — ABNORMAL HIGH (ref 70–99)
Glucose-Capillary: 153 mg/dL — ABNORMAL HIGH (ref 70–99)
Glucose-Capillary: 127 mg/dL — ABNORMAL HIGH (ref 70–99)

## 2019-03-24 LAB — APTT
aPTT: 35 seconds (ref 24–36)
aPTT: 96 seconds — ABNORMAL HIGH (ref 24–36)

## 2019-03-24 LAB — HEPARIN LEVEL (UNFRACTIONATED)
Heparin Unfractionated: >2.2 IU/mL — ABNORMAL HIGH (ref 0.30–0.70)
Heparin Unfractionated: >2.2 IU/mL — ABNORMAL HIGH (ref 0.30–0.70)

## 2019-03-24 LAB — RENAL FUNCTION PANEL
Albumin: 3.3 g/dL — ABNORMAL LOW (ref 3.5–5.0)
Anion gap: 11 (ref 5–15)
BUN: 39 mg/dL — ABNORMAL HIGH (ref 8–23)
CO2: 31 mmol/L (ref 22–32)
Calcium: 10.1 mg/dL (ref 8.9–10.3)
Chloride: 94 mmol/L — ABNORMAL LOW (ref 98–111)
Creatinine, Ser: 1.6 mg/dL — ABNORMAL HIGH (ref 0.44–1.00)
GFR calc Af Amer: 36 mL/min — ABNORMAL LOW (ref >60)
GFR calc non Af Amer: 31 mL/min — ABNORMAL LOW (ref >60)
Glucose, Bld: 157 mg/dL — ABNORMAL HIGH (ref 70–99)
Phosphorus: 4.1 mg/dL (ref 2.5–4.6)
Potassium: 3.9 mmol/L (ref 3.5–5.1)
Sodium: 136 mmol/L (ref 135–145)

## 2019-03-24 LAB — COOXEMETRY PANEL
Carboxyhemoglobin: 1.9 % — ABNORMAL HIGH (ref 0.5–1.5)
Methemoglobin: 1.1 % (ref 0.0–1.5)
O2 Saturation: 66.6 %
Total hemoglobin: 9.4 g/dL — ABNORMAL LOW (ref 12.0–16.0)

## 2019-03-24 LAB — MAGNESIUM: Magnesium: 1.6 mg/dL — ABNORMAL LOW (ref 1.7–2.4)

## 2019-03-24 MED ORDER — HEPARIN (PORCINE) 25000 UT/250ML-% IV SOLN
1100.0000 [IU]/h | INTRAVENOUS | Status: DC
  Administered 2019-03-24: 1100 [IU]/h via INTRAVENOUS
  Filled 2019-03-24: qty 250

## 2019-03-24 MED ORDER — FUROSEMIDE 10 MG/ML IJ SOLN: 80.0000 mg | Freq: Two times a day (BID) | INTRAMUSCULAR | Status: DC

## 2019-03-24 MED ORDER — ASPIRIN 81 MG PO CHEW
81.0000 mg | CHEWABLE_TABLET | ORAL | Status: AC
  Administered 2019-03-25: 81 mg via ORAL
  Filled 2019-03-24: qty 1

## 2019-03-24 MED ORDER — FUROSEMIDE 10 MG/ML IJ SOLN
80.0000 mg | Freq: Two times a day (BID) | INTRAMUSCULAR | Status: AC
  Administered 2019-03-24: 80 mg via INTRAVENOUS
  Filled 2019-03-24: qty 8

## 2019-03-24 MED ORDER — MAGNESIUM SULFATE 2 GM/50ML IV SOLN
2.0000 g | Freq: Once | INTRAVENOUS | Status: AC
  Administered 2019-03-24: 2 g via INTRAVENOUS
  Filled 2019-03-24: qty 50

## 2019-03-24 MED ORDER — POTASSIUM CHLORIDE CRYS ER 20 MEQ PO TBCR
40.0000 meq | EXTENDED_RELEASE_TABLET | Freq: Once | ORAL | Status: AC
  Administered 2019-03-24: 40 meq via ORAL
  Filled 2019-03-24: qty 2

## 2019-03-24 NOTE — Progress Notes (Addendum)
ANTICOAGULATION CONSULT NOTE - Initial Consult  Pharmacy Consult for Heparin Indication: atrial fibrillation  Allergies  Allergen Reactions  . Penicillins Hives    Has patient had a PCN reaction causing immediate rash, facial/tongue/throat swelling, SOB or lightheadedness with hypotension: Yes Has patient had a PCN reaction causing severe rash involving mucus membranes or skin necrosis: No Has patient had a PCN reaction that required hospitalization No Has patient had a PCN reaction occurring within the last 10 years: No If all of the above answers are "NO", then may proceed with Cephalosporin use.     Patient Measurements: Height: 5' 3" (160 cm) Weight: 189 lb 13.1 oz (86.1 kg) IBW/kg (Calculated) : 52.4 Heparin Dosing Weight: 74 kg  Vital Signs: Temp: 98.3 F (36.8 C) (03/14 0002) Temp Source: Oral (03/14 0400) BP: 117/62 (03/14 0800) Pulse Rate: 103 (03/14 0800)  Labs: Recent Labs    03/22/19 0439 03/22/19 0439 03/23/19 0542 03/24/19 0552  HGB 8.7*   < > 9.1* 9.4*  HCT 27.0*  --  27.6* 29.9*  PLT 153  --  163 165  CREATININE 2.55*  --  1.88* 1.60*   < > = values in this interval not displayed.    Estimated Creatinine Clearance: 31.6 mL/min (A) (by C-G formula based on SCr of 1.6 mg/dL (H)).   Medical History: Past Medical History:  Diagnosis Date  . AKI (acute kidney injury) ([place]) 03/18/2019  . Allergic rhinitis   . Allergy   . Anxiety   . Atrial fibrillation ([place])   . Carpal tunnel syndrome    left  . Cataract   . Depression   . Diabetic neuropathy ([place])   . Dyspnea   . Essential hypertension   . GERD (gastroesophageal reflux disease)   . Mixed hyperlipidemia   . Normocytic anemia 12/18/2018  . PAF (paroxysmal atrial fibrillation) ([place])    a. s/p TEE cardioversion March 2017. b. Recurrence by event monitor 04/2016.  Marland Kitchen Pneumonia   . PONV (postoperative nausea and vomiting)    after previous bronchoscopy  . RBBB   . Restless leg syndrome   . Type 2  diabetes mellitus ([place])   . Uterine cancer [facility])     Assessment: 74 yo female with atrial fibrillation on Eliquis PTA to start heparin for LHC/RHC on 3/14. Patient appears to have been in atrial flutter since at least 12/20. Planning for eventual TEE/DCCV once titrated off dobutamine. Last dose of Eliquis was given on 03/23/19 at 2149.   CBC is low but stable. No overt bleeding noted. Will start heparin drip at least 12 hours after last Eliquis dose.  Goal of Therapy:  Heparin level 0.3-0.7 units/ml Monitor platelets by anticoagulation protocol: Yes  Plan:  - Start heparin drip at 1100 units/hr at 1000 on 3/14 - Check baseline and 8-hr aPTT and HL - Daily aPTT, HL, CBC until levels correlate - Monitor for s/sx bleeding  Richardine Service, PharmD PGY1 Pharmacy Resident Phone: [phone] 03/24/2019  9:07 AM  Please check [email or web address] for unit-specific pharmacy phone numbers.

## 2019-03-24 NOTE — Progress Notes (Signed)
Collected and sent Coox to main lab.  Informed Lab to expect sample.

## 2019-03-24 NOTE — Progress Notes (Signed)
Patient ID: Ellen Jordan, female   DOB: 12/23/1945, 73 y.o.   MRN: [id number]     Advanced Heart Failure Rounding Note  PCP-Cardiologist: Samuel McDowell, MD   Subjective:    Now on dobutamine 7.5 mcg/kg/min with co-ox 67%.  Creatinine trending down, 1.6 today.  Good UOP yesterday, weight down again.  CVP 11-12 today.   Starting to feel stronger.   She remains in atrial fibrillation, rate around 110 on amiodarone gtt.     Objective:   Weight Range: 86.1 kg Body mass index is 33.62 kg/m.   Vital Signs:   Temp:  [98.1 F (36.7 C)-98.3 F (36.8 C)] 98.3 F (36.8 C) (03/14 0002) Pulse Rate:  [105-119] 105 (03/14 0500) Resp:  [11-21] 11 (03/14 0500) BP: (129-133)/(52-63) 129/58 (03/14 0400) SpO2:  [95 %-98 %] 98 % (03/14 0736) Weight:  [86.1 kg] 86.1 kg (03/14 0100) Last BM Date: 03/23/19  Weight change: Filed Weights   03/22/19 0542 03/23/19 0400 03/24/19 0100  Weight: 91.2 kg 85.9 kg 86.1 kg    Intake/Output:   Intake/Output Summary (Last 24 hours) at 03/24/2019 0834 Last data filed at 03/24/2019 0335 Gross per 24 hour  Intake 1516.96 ml  Output 5250 ml  Net -3733.04 ml      Physical Exam   CVP 11-12 General: NAD Neck: JVP 10-12, no thyromegaly or thyroid nodule.  Lungs: Clear to auscultation bilaterally with normal respiratory effort. CV: Nondisplaced PMI.  Heart mildly tachy, irregular S1/S2, no S3/S4, no murmur.  Trace ankle edema.   Abdomen: Soft, nontender, no hepatosplenomegaly, no distention.  Skin: Intact without lesions or rashes.  Neurologic: Alert and oriented x 3.  Psych: Normal affect. Extremities: No clubbing or cyanosis.  HEENT: Normal.    Telemetry  Atrial fibrillation 100s-110s (personally reviewed)  EKG   n/a   Labs    CBC Recent Labs    03/23/19 0542 03/24/19 0552  WBC 5.4 5.6  HGB 9.1* 9.4*  HCT 27.6* 29.9*  MCV 94.2 96.5  PLT 163 165   Basic Metabolic Panel Recent Labs    03/23/19 0542 03/24/19 0552  NA 137 136   K 3.4* 3.9  CL 96* 94*  CO2 29 31  GLUCOSE 79 157*  BUN 49* 39*  CREATININE 1.88* 1.60*  CALCIUM 10.1 10.1  MG 1.6* 1.6*  PHOS 4.4 4.1   Liver Function Tests Recent Labs    03/23/19 0542 03/24/19 0552  ALBUMIN 3.4* 3.3*   No results for input(s): LIPASE, AMYLASE in the last 72 hours. Cardiac Enzymes No results for input(s): CKTOTAL, CKMB, CKMBINDEX, TROPONINI in the last 72 hours.  BNP: BNP (last 3 results) Recent Labs    03/17/19 2213  BNP 338.0*    ProBNP (last 3 results) Recent Labs    09/12/18 1201  PROBNP 51.0     D-Dimer No results for input(s): DDIMER in the last 72 hours. Hemoglobin A1C No results for input(s): HGBA1C in the last 72 hours. Fasting Lipid Panel No results for input(s): CHOL, HDL, LDLCALC, TRIG, CHOLHDL, LDLDIRECT in the last 72 hours. Thyroid Function Tests No results for input(s): TSH, T4TOTAL, T3FREE, THYROIDAB in the last 72 hours.  Invalid input(s): FREET3  Other results:   Imaging    No results found.   Medications:     Scheduled Medications: . acetaminophen  1,000 mg Oral Q8H  . apixaban  5 mg Oral BID  . atorvastatin  40 mg Oral Daily  . Chlorhexidine Gluconate Cloth  6 each Topical   Daily  . darbepoetin (ARANESP) injection - NON-DIALYSIS  60 mcg Subcutaneous Q Wed-1800  . fluticasone  2 spray Each Nare Daily  . fluticasone furoate-vilanterol  1 puff Inhalation Daily  . furosemide  80 mg Intravenous BID  . insulin aspart  0-5 Units Subcutaneous QHS  . insulin aspart  0-9 Units Subcutaneous TID WC  . insulin detemir  30 Units Subcutaneous BID  . loratadine  10 mg Oral Daily  . mouth rinse  15 mL Mouth Rinse BID  . pantoprazole  40 mg Oral Daily  . potassium chloride  40 mEq Oral Once  . ramelteon  8 mg Oral QHS  . sertraline  100 mg Oral Daily  . sodium chloride flush  10-40 mL Intracatheter Q12H  . sodium chloride flush  3 mL Intravenous Q12H  . sodium chloride flush  3 mL Intravenous Q12H    Infusions:  . sodium chloride    . sodium chloride 250 mL (03/24/19 0700)  . amiodarone 30 mg/hr (03/23/19 1948)  . DOBUTamine 7.5 mcg/kg/min (03/23/19 0648)  . ferumoxytol    . magnesium sulfate bolus IVPB      PRN Medications: sodium chloride, sodium chloride, albuterol, ALPRAZolam, camphor-menthol, ipratropium-albuterol, liver oil-zinc oxide, loperamide, sodium chloride flush, sodium chloride flush   Assessment/Plan   1. RV failure: Echo this admission shows LV EF 65-70% with RV moderate-severely dilated and moderate dysfunction, D-shaped septum, the mitral valve is severely calcified though mean gradient is only about 5 mmHg suggesting mild to moderate mitral stenosis (pressure half-time does not appear short), mild AS.  The patient is markedly volume overloaded.  I am concerned that she has end-stage RV dysfunction from pulmonary hypertension.  She also has mitral stenosis which visually looks significant but measures only mild-moderate by mean gradient and pressure half-time.  She has been oliguric with creatinine up to 4.27 => down to 1.6 today on dobutamine.  We are trying inotropes empirically to see if we can improve RV function enough to achieve diuresis.  Given her markedly low GFR and soft BP at times,  She was started on dobutamine 3 mcg/kg/min, now has been increased to 7.5 with improved diuresis and creatinine coming down.  Co-ox 67% today with CVP 11-12.  - Decrease dobutamine to 5 today.  - Decrease Lasix to 80 mg IV bid, probably stop IV Lasix after today.  - RHC/LHC (hemodynamics only to assess mitral stenosis) on Monday.   - Suspect prognosis is overall poor with severe RV failure.  Not certain we will be able to maintain improvement after weaning her off dobutamine.  2. Pulmonary hypertension: PASP estimated at 41 mmHg on echo this admission and 42 mmHg on echo in 2/20.  However, there has been suspicion for severe pulmonary hypertension based on dilated and dysfunctional RV.  There was  concern for group 3 PH but recent high resolution chest CT in 2/21 does not show marked lung abnormality (there does not seem to be severe emphysema or interstitial lung disease). It is possible that she has had group 1 PH (or group 2 PH if mitral stenosis is worse than it appears by echo doppler), but will need RHC/hemodynamic LHC for formal diagnosis.  3. Mitral stenosis: The valve looks severely calcified, but hemodynamics by echo are not that impressive.  Mean gradient 5 mmHg and MVA 2.66 cm^2 by PHT suggest no more than mild mitral stenosis.   - Would like to confirm severity of MS when cath is done (would require right and   left heart cath to measure MV gradient invasively).  - If she has moderate to severe MS, she would not be a good candidate for valve replacement.  4. Atrial flutter: The patient appears to have been in atrial flutter since at least 12/20.  Mild RVR.   - Off atenolol with AKI.  - Control HR with amiodarone gtt while on inotrope.  - Would like to do eventual TEE/DCCV, but would need to be after she titrates off dobutamine as she would be less likely to hold NSR on dobutamine.  - Continue apixaban for now, will hold prior to cath (change over to heparin on Sunday).  5. Acute on CKD stage 3: Creatinine peaked at 4.3. Todays creatinine is down to 2.55. Suspect cardiorenal syndrome in setting of RV failure.  Hopefully will continue to improve with effective diuresis and lowering of renal venous pressure.  She would be a poor candidate for long-term HD with RV failure.  6. Chronic hypoxemic respiratory failure:  She has a history of smoking, but high resolution CT lungs does not show clear evidence of severe COPD or of interstitial lung disease.  It is possible that she has group 1 pulmonary HTN as cause of oxygen requirement.  7. Hyponatremia: Sodium up with tolvaptan dose on 3/11.  Fluid restrict.    Mobilize out of bed with PT.   Length of Stay: 6  Charlee Squibb, MD   03/24/2019, 8:34 AM  Advanced Heart Failure Team Pager [phone] (M-F; 7a - 4p)  Please contact [facility] for night-coverage after hours (4p -7a ) and weekends on [email or web address]   

## 2019-03-24 NOTE — H&P (View-Only) (Signed)
Patient ID: Ellen Jordan, female   DOB: 14-Sep-1945, 74 y.o.   MRN: [id number]     Advanced Heart Failure Rounding Note  PCP-Cardiologist: Rozann Lesches, MD   Subjective:    Now on dobutamine 7.5 mcg/kg/min with co-ox 67%.  Creatinine trending down, 1.6 today.  Good UOP yesterday, weight down again.  CVP 11-12 today.   Starting to feel stronger.   She remains in atrial fibrillation, rate around 110 on amiodarone gtt.     Objective:   Weight Range: 86.1 kg Body mass index is 33.62 kg/m.   Vital Signs:   Temp:  [98.1 F (36.7 C)-98.3 F (36.8 C)] 98.3 F (36.8 C) (03/14 0002) Pulse Rate:  [105-119] 105 (03/14 0500) Resp:  [11-21] 11 (03/14 0500) BP: (129-133)/(52-63) 129/58 (03/14 0400) SpO2:  [95 %-98 %] 98 % (03/14 0736) Weight:  [86.1 kg] 86.1 kg (03/14 0100) Last BM Date: 03/23/19  Weight change: Filed Weights   03/22/19 0542 03/23/19 0400 03/24/19 0100  Weight: 91.2 kg 85.9 kg 86.1 kg    Intake/Output:   Intake/Output Summary (Last 24 hours) at 03/24/2019 0834 Last data filed at 03/24/2019 0335 Gross per 24 hour  Intake 1516.96 ml  Output 5250 ml  Net -3733.04 ml      Physical Exam   CVP 11-12 General: NAD Neck: JVP 10-12, no thyromegaly or thyroid nodule.  Lungs: Clear to auscultation bilaterally with normal respiratory effort. CV: Nondisplaced PMI.  Heart mildly tachy, irregular S1/S2, no S3/S4, no murmur.  Trace ankle edema.   Abdomen: Soft, nontender, no hepatosplenomegaly, no distention.  Skin: Intact without lesions or rashes.  Neurologic: Alert and oriented x 3.  Psych: Normal affect. Extremities: No clubbing or cyanosis.  HEENT: Normal.    Telemetry  Atrial fibrillation 100s-110s (personally reviewed)  EKG   n/a   Labs    CBC Recent Labs    03/23/19 0542 03/24/19 0552  WBC 5.4 5.6  HGB 9.1* 9.4*  HCT 27.6* 29.9*  MCV 94.2 96.5  PLT 163 366   Basic Metabolic Panel Recent Labs    03/23/19 0542 03/24/19 0552  NA 137 136   K 3.4* 3.9  CL 96* 94*  CO2 29 31  GLUCOSE 79 157*  BUN 49* 39*  CREATININE 1.88* 1.60*  CALCIUM 10.1 10.1  MG 1.6* 1.6*  PHOS 4.4 4.1   Liver Function Tests Recent Labs    03/23/19 0542 03/24/19 0552  ALBUMIN 3.4* 3.3*   No results for input(s): LIPASE, AMYLASE in the last 72 hours. Cardiac Enzymes No results for input(s): CKTOTAL, CKMB, CKMBINDEX, TROPONINI in the last 72 hours.  BNP: BNP (last 3 results) Recent Labs    03/17/19 2213  BNP 338.0*    ProBNP (last 3 results) Recent Labs    09/12/18 1201  PROBNP 51.0     D-Dimer No results for input(s): DDIMER in the last 72 hours. Hemoglobin A1C No results for input(s): HGBA1C in the last 72 hours. Fasting Lipid Panel No results for input(s): CHOL, HDL, LDLCALC, TRIG, CHOLHDL, LDLDIRECT in the last 72 hours. Thyroid Function Tests No results for input(s): TSH, T4TOTAL, T3FREE, THYROIDAB in the last 72 hours.  Invalid input(s): FREET3  Other results:   Imaging    No results found.   Medications:     Scheduled Medications: . acetaminophen  1,000 mg Oral Q8H  . apixaban  5 mg Oral BID  . atorvastatin  40 mg Oral Daily  . Chlorhexidine Gluconate Cloth  6 each Topical  Daily  . darbepoetin (ARANESP) injection - NON-DIALYSIS  60 mcg Subcutaneous Q Wed-1800  . fluticasone  2 spray Each Nare Daily  . fluticasone furoate-vilanterol  1 puff Inhalation Daily  . furosemide  80 mg Intravenous BID  . insulin aspart  0-5 Units Subcutaneous QHS  . insulin aspart  0-9 Units Subcutaneous TID WC  . insulin detemir  30 Units Subcutaneous BID  . loratadine  10 mg Oral Daily  . mouth rinse  15 mL Mouth Rinse BID  . pantoprazole  40 mg Oral Daily  . potassium chloride  40 mEq Oral Once  . ramelteon  8 mg Oral QHS  . sertraline  100 mg Oral Daily  . sodium chloride flush  10-40 mL Intracatheter Q12H  . sodium chloride flush  3 mL Intravenous Q12H  . sodium chloride flush  3 mL Intravenous Q12H    Infusions:  . sodium chloride    . sodium chloride 250 mL (03/24/19 0700)  . amiodarone 30 mg/hr (03/23/19 1948)  . DOBUTamine 7.5 mcg/kg/min (03/23/19 7026)  . ferumoxytol    . magnesium sulfate bolus IVPB      PRN Medications: sodium chloride, sodium chloride, albuterol, ALPRAZolam, camphor-menthol, ipratropium-albuterol, liver oil-zinc oxide, loperamide, sodium chloride flush, sodium chloride flush   Assessment/Plan   1. RV failure: Echo this admission shows LV EF 65-70% with RV moderate-severely dilated and moderate dysfunction, D-shaped septum, the mitral valve is severely calcified though mean gradient is only about 5 mmHg suggesting mild to moderate mitral stenosis (pressure half-time does not appear short), mild AS.  The patient is markedly volume overloaded.  I am concerned that she has end-stage RV dysfunction from pulmonary hypertension.  She also has mitral stenosis which visually looks significant but measures only mild-moderate by mean gradient and pressure half-time.  She has been oliguric with creatinine up to 4.27 => down to 1.6 today on dobutamine.  We are trying inotropes empirically to see if we can improve RV function enough to achieve diuresis.  Given her markedly low GFR and soft BP at times,  She was started on dobutamine 3 mcg/kg/min, now has been increased to 7.5 with improved diuresis and creatinine coming down.  Co-ox 67% today with CVP 11-12.  - Decrease dobutamine to 5 today.  - Decrease Lasix to 80 mg IV bid, probably stop IV Lasix after today.  - RHC/LHC (hemodynamics only to assess mitral stenosis) on Monday.   - Suspect prognosis is overall poor with severe RV failure.  Not certain we will be able to maintain improvement after weaning her off dobutamine.  2. Pulmonary hypertension: PASP estimated at 41 mmHg on echo this admission and 42 mmHg on echo in 2/20.  However, there has been suspicion for severe pulmonary hypertension based on dilated and dysfunctional RV.  There was  concern for group 3 PH but recent high resolution chest CT in 2/21 does not show marked lung abnormality (there does not seem to be severe emphysema or interstitial lung disease). It is possible that she has had group 1 PH (or group 2 PH if mitral stenosis is worse than it appears by echo doppler), but will need RHC/hemodynamic LHC for formal diagnosis.  3. Mitral stenosis: The valve looks severely calcified, but hemodynamics by echo are not that impressive.  Mean gradient 5 mmHg and MVA 2.66 cm^2 by PHT suggest no more than mild mitral stenosis.   - Would like to confirm severity of MS when cath is done (would require right and  left heart cath to measure MV gradient invasively).  - If she has moderate to severe MS, she would not be a good candidate for valve replacement.  4. Atrial flutter: The patient appears to have been in atrial flutter since at least 12/20.  Mild RVR.   - Off atenolol with AKI.  - Control HR with amiodarone gtt while on inotrope.  - Would like to do eventual TEE/DCCV, but would need to be after she titrates off dobutamine as she would be less likely to hold NSR on dobutamine.  - Continue apixaban for now, will hold prior to cath (change over to heparin on Sunday).  5. Acute on CKD stage 3: Creatinine peaked at 4.3. Todays creatinine is down to 2.55. Suspect cardiorenal syndrome in setting of RV failure.  Hopefully will continue to improve with effective diuresis and lowering of renal venous pressure.  She would be a poor candidate for long-term HD with RV failure.  6. Chronic hypoxemic respiratory failure:  She has a history of smoking, but high resolution CT lungs does not show clear evidence of severe COPD or of interstitial lung disease.  It is possible that she has group 1 pulmonary HTN as cause of oxygen requirement.  7. Hyponatremia: Sodium up with tolvaptan dose on 3/11.  Fluid restrict.    Mobilize out of bed with PT.   Length of Stay: 6  [name], MD   03/24/2019, 8:34 AM  Advanced Heart Failure Team Pager [phone] (M-F; 7a - 4p)  Please contact [facility] for night-coverage after hours (4p -7a ) and weekends on [email or web address]

## 2019-03-24 NOTE — Progress Notes (Signed)
PROGRESS NOTE  Ellen Jordan QQV:[id number] DOB: December 02, 1945   PCP: [name], MD  Patient is from: Home.  Lives with her husband.  Recently started using walker due to recurrent fall and weakness.  DOA: 03/17/2019 LOS: 6  Brief Narrative / Interim history: 74 year old female with history of COPD on 2 L, ILD, A. fib on Eliquis, diastolic CHF, Pulm HTN, DM-2, CKD-3B, HTN and obesity presented to [facility] with progressive shortness of breath and rectal bleed and admitted for acute on chronic renal failure and acute on chronic diastolic CHF.   At [facility] ED, Cr 2.99 (baseline about 1.4).  CXR with mild pulmonary vascular congestion.  Cardiology consulted there but not able to diurese patient due to worsening renal function.  She was transferred to [facility] for nephrology input.  Nephrology, advanced heart failure team, PCCM and palliative care consulted.  Started on high-dose IV Lasix with dobutamine.  03/24/2019: Patient seen alongside patient's husband and nurse.  Patient and patient's husband feel patient is improving.  Serum creatinine is improving.  Cardiology team plans cardiac catheterization.  Will defer fluid management to the cardiology team.  Subjective: No new complaints. No worsening of shortness of breath Or chest pain  Objective: Vitals:   03/24/19 0500 03/24/19 0736 03/24/19 0800 03/24/19 1600  BP:   117/62 139/88  Pulse: (!) 105  (!) 103   Resp: 11  16   Temp:      TempSrc:      SpO2: 98% 98% 97%   Weight:      Height:        Intake/Output Summary (Last 24 hours) at 03/24/2019 1833 Last data filed at 03/24/2019 1800 Gross per 24 hour  Intake 483 ml  Output 3600 ml  Net -3117 ml   Filed Weights   03/22/19 0542 03/23/19 0400 03/24/19 0100  Weight: 91.2 kg 85.9 kg 86.1 kg    Examination:  GENERAL: No acute distress.  Appears well.  HEENT: Mild pallor.  No jaundice.    NECK: Supple.  No apparent JVD.  RESP: Clear to auscultation.   CVS:  S1-S2.   ABD/GI/GU: Bowel sounds present. Soft. Non tender.  EXT:  Unna boots over BLE. NEURO: Patient is awake and alert.  Patient moves all extremities.  Procedures:  None  Assessment & Plan: Acute on chronic diastolic CHF/RV failure/Pulm HTN: Echo with EF of 60 to 65%, indeterminate diastolic function and interventricular septum flattening in systole and diastole consistent with right ventricular pressure and volume overload.  Likely cor pulmonale from underlying lung disease.  She has no significant crackles on exam.  Has progressive dyspnea and DOE but good saturation on 2 L by [place].  BNP elevated but no recent values to compare to.  She is on torsemide at home.  Responding to dobutamine and IV Lasix.  About 5.4 L UOP in the last 24 hours.  Renal function improving. -Advanced HF team managing-on dobutamine 7.5 and IV Lasix 120 mg 3 times daily.  No metolazone today. -Continue monitoring fluid status, renal function and electrolytes. -Sodium and fluid restrictions. -Possibly R/LHC on 3/15 03/24/2019: Cardiology team is directing care.  For cardiac catheterization.  Mitral valve stenosis -Plan for further evaluation with heart cath when stable -Per cardiology-not a good candidate for valve replacement  Atrial flutter with wide-QRS and QTc-HR fairly controlled. QT could be exaggerated by wide QRS due to RBBB. -Cardiology managing-flecainide, Cardizem and atenolol discontinued. -Started on amiodarone drip while on inotropes. -On  Eliquis for anticoagulation. -Optimize electrolytes -Possible DCCV down the road when off dobutamine.  AKI on CKD-3B/azotemia: Cr 1.4 (on 02/14/2019)> 2.99 (admit)>> 4.37> 4.27> 3.79> 2.55> 1.88.  BUN 44>> 66> 59> 49.  Suspect prerenal etiology from hypotension and heart failure.  She is also on torsemide and lisinopril at home which could contribute. FENa 1.3% suggesting intrinsic etiology which could happen after initial prerenal insult.  Renal US-CKD, no Hydro.  UA  negative.  Renal function improving with diuretics and inotropes. -Not a candidate for HD due to RV failure per nephrology -Continue monitoring 03/24/2019: AKI is improving.  Likely cardiorenal.  Progressive dyspnea/DOE-mainly due to CHF and pulmonary hypertension at this time.  Has a history of COPD.  Also some concern about ILD but  high-resolution CT on 03/01/2019 didn't confirm ILD. -Treat treatable causes as above -Wean oxygen to maintain saturation above 90% -Appreciate guidance by PCCM 03/24/2019: Improving.  Chronic COPD/respiratory failure on 2 L by nasal cannula at baseline. -Continue Breo Ellipta, duo nebs and supplemental oxygen -Appreciate insight by PCCM  Controlled DM-2 with hyperglycemia: A1c 6.7%.  On Levemir 15 units nightly and NovoLog 10 to 16 units 3 times daily at home. Recent Labs    03/23/19 2119 03/24/19 0620 03/24/19 1134  GLUCAP 169* 84 184*  -Continue Levemir to 30 units twice daily, SSI-thin and a statin  Normocytic anemia: Baseline Hgb 9-10> 9.4 (admit)> 9.5> 8.9> 8.7> 9.1.  Iron sat 10%.  TIBC 473.  Mention of rectal bleed on presentation.  Husband thinks she might have wiped herself hard.  She is on Eliquis. -Continue monitoring -IV iron and ESA per nephrology.  Hypervolemic hyponatremia-likely due to CHF or renal failure.  Resolved. -Received tolvaptan once on 3/11.  Anxiety and depression/insomnia: Stable -Continue Zoloft and Xanax. -Add ramelteon at night.  Allergic rhinitis -Flonase and loratadine  Hyperlipidemia -Fasting lipid panel in the morning -continue statins  GERD -continue protonix  Class II obesity: Body mass index is 33.62 kg/m.  Weight improved after diuretics. -Encourage lifestyle change to lose weight -May benefit from newer agents with cardiovascular benefits  Pulmonary nodules: Noted on high-resolution CT on 02/19/2019 -Follow-up CT in 12 months as recommended.  Liver cirrhosis?  Noted on high-resolution CT last  months.  Debility/Generalized weakness: recently started using walker recently -PT/OT-recommended SNF but she wished to go home with home hospice when ready  Goal of care: DNR/DNI appropriately.  Multiple comorbidities as above.  Not a candidate for dialysis. -Palliative care consulted.    DVT prophylaxis: On Eliquis for A. fib Code Status: DNR/DNI Family Communication: Patient and RN.  No family member at bedside today.  Discharge barrier: Evaluation and treatment of acute renal failure and decompensated CHF.  On inotropes and IV diuretics Patient is from: Home Final disposition: Likely home with home hospice when medically stable and cleared by cardiology.  Consultants:  nephrology (off), PCCM (off), advanced HF team and palliative care   Microbiology summarized: COVID-19 screen negative.  Sch Meds:  Scheduled Meds: . acetaminophen  1,000 mg Oral Q8H  . [START ON 03/25/2019] aspirin  81 mg Oral Pre-Cath  . atorvastatin  40 mg Oral Daily  . Chlorhexidine Gluconate Cloth  6 each Topical Daily  . darbepoetin (ARANESP) injection - NON-DIALYSIS  60 mcg Subcutaneous Q Wed-1800  . fluticasone  2 spray Each Nare Daily  . fluticasone furoate-vilanterol  1 puff Inhalation Daily  . insulin aspart  0-5 Units Subcutaneous QHS  . insulin aspart  0-9 Units Subcutaneous TID WC  .  insulin detemir  30 Units Subcutaneous BID  . loratadine  10 mg Oral Daily  . mouth rinse  15 mL Mouth Rinse BID  . pantoprazole  40 mg Oral Daily  . ramelteon  8 mg Oral QHS  . sertraline  100 mg Oral Daily  . sodium chloride flush  10-40 mL Intracatheter Q12H  . sodium chloride flush  3 mL Intravenous Q12H  . sodium chloride flush  3 mL Intravenous Q12H   Continuous Infusions: . sodium chloride    . sodium chloride 250 mL (03/24/19 0700)  . amiodarone 30 mg/hr (03/24/19 0854)  . DOBUTamine 5 mcg/kg/min (03/24/19 0854)  . heparin 1,100 Units/hr (03/24/19 1123)   PRN Meds:.sodium chloride, sodium chloride,  albuterol, ALPRAZolam, camphor-menthol, ipratropium-albuterol, liver oil-zinc oxide, loperamide, sodium chloride flush, sodium chloride flush  Antimicrobials: Anti-infectives (From admission, onward)   None       I have personally reviewed the following labs and images: CBC: Recent Labs  Lab 03/17/19 2212 03/18/19 0505 03/20/19 1248 03/21/19 0630 03/22/19 0439 03/23/19 0542 03/24/19 0552  WBC 10.8*   < > 8.4 6.3 5.4 5.4 5.6  NEUTROABS 9.5*  --   --   --   --   --   --   HGB 9.4*   < > 9.5* 8.9* 8.7* 9.1* 9.4*  HCT 30.3*   < > 28.8* 27.9* 27.0* 27.6* 29.9*  MCV 97.4   < > 93.2 94.3 95.1 94.2 96.5  PLT 176   < > 168 154 153 163 165   < > = values in this interval not displayed.   BMP &GFR Recent Labs  Lab 03/20/19 0410 03/20/19 0410 03/21/19 0630 03/21/19 1722 03/22/19 0439 03/23/19 0542 03/24/19 0552  NA 127*   < > 126* 127* 130* 137 136  K 5.1  --  4.6  --  4.0 3.4* 3.9  CL 93*  --  93*  --  93* 96* 94*  CO2 18*  --  20*  --  _0 GLUCOSE 141*  --  180*  --  134* 79 157*  BUN 61*  --  66*  --  59* 49* 39*  CREATININE 4.27*  --  3.79*  --  2.55* 1.88* 1.60*  CALCIUM 8.7*  --  8.9  --  9.7 10.1 10.1  MG  --   --  1.8  --  1.8 1.6* 1.6*  PHOS  --   --  6.6*  --  5.5* 4.4 4.1   < > = values in this interval not displayed.   Estimated Creatinine Clearance: 31.6 mL/min (A) (by C-G formula based on SCr of 1.6 mg/dL (H)). Liver & Pancreas: Recent Labs  Lab 03/17/19 2212 03/17/19 2212 03/18/19 0505 03/21/19 0630 03/22/19 0439 03/23/19 0542 03/24/19 0552  AST 28  --  30  --   --   --   --   ALT 20  --  18  --   --   --   --   ALKPHOS 63  --  62  --   --   --   --   BILITOT 0.6  --  0.6  --   --   --   --   PROT 7.2  --  7.1  --   --   --   --   ALBUMIN 3.8   < > 3.7 3.1* 3.2* 3.4* 3.3*   < > = values in this interval not displayed.   No  results for input(s): LIPASE, AMYLASE in the last 168 hours. No results for input(s): AMMONIA in the last 168  hours. Diabetic: No results for input(s): HGBA1C in the last 72 hours. Recent Labs  Lab 03/23/19 1200 03/23/19 1648 03/23/19 2119 03/24/19 0620 03/24/19 1134  GLUCAP 225* 163* 169* 84 184*   Cardiac Enzymes: No results for input(s): CKTOTAL, CKMB, CKMBINDEX, TROPONINI in the last 168 hours. Recent Labs    09/12/18 1201  PROBNP 51.0   Coagulation Profile: No results for input(s): INR, PROTIME in the last 168 hours. Thyroid Function Tests: No results for input(s): TSH, T4TOTAL, FREET4, T3FREE, THYROIDAB in the last 72 hours. Lipid Profile: No results for input(s): CHOL, HDL, LDLCALC, TRIG, CHOLHDL, LDLDIRECT in the last 72 hours. Anemia Panel: No results for input(s): VITAMINB12, FOLATE, FERRITIN, TIBC, IRON, RETICCTPCT in the last 72 hours. Urine analysis:    Component Value Date/Time   COLORURINE YELLOW 03/20/2019 2030   [place] 03/20/2019 2030   LABSPEC 1.010 03/20/2019 2030   [place] 5.0 03/20/2019 2030   GLUCOSEU NEGATIVE 03/20/2019 2030   [place] NEGATIVE 03/20/2019 2030   [place] NEGATIVE 03/20/2019 2030   [place] 03/20/2019 2030   PROTEINUR 30 (A) 03/20/2019 2030   NITRITE NEGATIVE 03/20/2019 2030   LEUKOCYTESUR SMALL (A) 03/20/2019 2030   Sepsis Labs: Invalid input(s): PROCALCITONIN, [place]  Microbiology: Recent Results (from the past 240 hour(s))  SARS CORONAVIRUS 2 (TAT 6-24 HRS) Nasopharyngeal Nasopharyngeal Swab     Status: None   Collection Time: 03/18/19 12:10 AM   Specimen: Nasopharyngeal Swab  Result Value Ref Range Status   SARS Coronavirus 2 NEGATIVE NEGATIVE Final    Comment: (NOTE) SARS-CoV-2 target nucleic acids are NOT DETECTED. The SARS-CoV-2 RNA is generally detectable in upper and lower respiratory specimens during the acute phase of infection. Negative results do not preclude SARS-CoV-2 infection, do not rule out co-infections with other pathogens, and should not be used as the sole basis for treatment or  other patient management decisions. Negative results must be combined with clinical observations, patient history, and epidemiological information. The expected result is Negative. Fact Sheet for Patients: SugarRoll.be Fact Sheet for Healthcare Providers: https://www.woods-mathews.com/ This test is not yet approved or cleared by the [place] FDA and  has been authorized for detection and/or diagnosis of SARS-CoV-2 by FDA under an Emergency Use Authorization (EUA). This EUA will remain  in effect (meaning this test can be used) for the duration of the COVID-19 declaration under Section 56 4(b)(1) of the Act, 21 U.S.C. section 360bbb-3(b)(1), unless the authorization is terminated or revoked sooner. Performed at [facility], [address], [place], [place] [address]     Radiology Studies: No results found.  [name] Allan, MD [facility]  If 7PM-7AM, please contact night-coverage [email or web address] Password [facility] 03/24/2019, 6:33 PM

## 2019-03-24 NOTE — Progress Notes (Signed)
ANTICOAGULATION CONSULT NOTE - Follow Up  Consult  Pharmacy Consult for Heparin Indication: atrial fibrillation  Allergies  Allergen Reactions  . Penicillins Hives    Has patient had a PCN reaction causing immediate rash, facial/tongue/throat swelling, SOB or lightheadedness with hypotension: Yes Has patient had a PCN reaction causing severe rash involving mucus membranes or skin necrosis: No Has patient had a PCN reaction that required hospitalization No Has patient had a PCN reaction occurring within the last 10 years: No If all of the above answers are "NO", then may proceed with Cephalosporin use.     Patient Measurements: Height: 5' 3" (160 cm) Weight: 189 lb 13.1 oz (86.1 kg) IBW/kg (Calculated) : 52.4 Heparin Dosing Weight: 74 kg  Vital Signs: BP: 139/88 (03/14 1600)  Labs: Recent Labs    03/22/19 0439 03/22/19 0439 03/23/19 0542 03/24/19 0552 03/24/19 0924 03/24/19 1847  HGB 8.7*   < > 9.1* 9.4*  --   --   HCT 27.0*  --  27.6* 29.9*  --   --   PLT 153  --  163 165  --   --   APTT  --   --   --   --  35 96*  HEPARINUNFRC  --   --   --   --  >2.20* >2.20*  CREATININE 2.55*  --  1.88* 1.60*  --   --    < > = values in this interval not displayed.    Estimated Creatinine Clearance: 31.6 mL/min (A) (by C-G formula based on SCr of 1.6 mg/dL (H)).   Medical History: Past Medical History:  Diagnosis Date  . AKI (acute kidney injury) ([place]) 03/18/2019  . Allergic rhinitis   . Allergy   . Anxiety   . Atrial fibrillation ([place])   . Carpal tunnel syndrome    left  . Cataract   . Depression   . Diabetic neuropathy ([place])   . Dyspnea   . Essential hypertension   . GERD (gastroesophageal reflux disease)   . Mixed hyperlipidemia   . Normocytic anemia 12/18/2018  . PAF (paroxysmal atrial fibrillation) ([place])    a. s/p TEE cardioversion March 2017. b. Recurrence by event monitor 04/2016.  Marland Kitchen Pneumonia   . PONV (postoperative nausea and vomiting)    after previous  bronchoscopy  . RBBB   . Restless leg syndrome   . Type 2 diabetes mellitus ([place])   . Uterine cancer [facility])     Assessment: 74 yo female with atrial fibrillation on Eliquis PTA to start heparin 3/14 for LHC/RHC on 3/15. Patient appears to have been in atrial flutter since at least 12/20. Planning for eventual TEE/DCCV once titrated off dobutamine. Last dose of Eliquis was given on 03/23/19 at 2149.  HL remains elevated 2.2 - affected by Eliquis Heparin drip 1100 uts/hr aPTT 96sec at goal CBC is low but stable. No overt bleeding noted.   Goal of Therapy:  Heparin level 0.3-0.7 units/ml Monitor platelets by anticoagulation protocol: Yes  Plan:  - Continue heparin drip at 1100 units/hr  - Daily aPTT, HL, CBC until levels correlate - Monitor for s/sx bleeding   Bonnita Nasuti Pharm.D. CPP, BCPS Clinical Pharmacist [phone] 03/24/2019 8:58 PM    Please check [email or web address] for unit-specific pharmacy phone numbers.

## 2019-03-24 NOTE — Progress Notes (Signed)
OT Treatment:  Pt making gradual progress towards OT goals, tolerating functional transfers to/from [facility] with minguard assist this session. Pt continues to require increased assist for LB and toileting ADL (mod-maxA) including for completion of pericare after voiding bladder. HR fluctuating with max HR noted 125 with activity. Continue to recommend SNF at time of discharge to progress pt's safety and independence with ADL and mobility prior to return home. Will follow while acutely admitted.      03/24/19 0854  OT Visit Information  Last OT Received On 03/24/19  Assistance Needed +1  History of Present Illness Ellen Jordan  is a 74 y.o. female, with history of chronic pain atrial fibrillation, on chronic anticoagulation with Eliquis, diabetes mellitus type 2, anxiety, hypertension, pulmonary hypertension, came to hospital with worsening shortness of breath.  Patient says that shortness of breath got worse over the past few days.  She also complains of rectal bleeding tonight that occurs occasionally when she has hard bowel movement.  Denies chest pain, abdominal pain or dysuria.  She is followed by pulmonology for COPD and medication induced interstitial lung disease. Pt transferred to [facility] on 3/9.  Precautions  Precautions Fall  Precaution Comments unna boots bil LE  Pain Assessment  Pain Assessment Faces  Faces Pain Scale 4  Pain Location back, neck  Pain Descriptors / Indicators Discomfort;Aching  Cognition  Arousal/Alertness Awake/alert  Behavior During Therapy WFL for tasks assessed/performed  Overall Cognitive Status Within Functional Limits for tasks assessed  ADL  Overall ADL's  Needs assistance/impaired  Grooming Set up;Sitting;Wash/dry face  Grooming Details (indicate cue type and reason) seated in recliner  Lower Body Dressing Maximal assistance;Sit to/from stand  Lower Body Dressing Details (indicate cue type and reason) assist to don socks this AM  Toilet Transfer Min  guard;Stand-pivot;BSC  Toileting- Clothing Manipulation and Hygiene Moderate assistance;Sit to/from stand  Toileting - Clothing Manipulation Details (indicate cue type and reason) requires assist for pericare after voiding   Functional mobility during ADLs Min guard (stand pivot transfers )  Bed Mobility  General bed mobility comments up in recliner upon arrival  Balance  Standing balance support No upper extremity supported;During functional activity  Standing balance-Leahy Scale Fair  Restrictions  Weight Bearing Restrictions No  Transfers  Overall transfer level Needs assistance  Equipment used 1 person hand held assist  Transfers Sit to/from Stand  Sit to Stand Min guard  Stand pivot transfers Min guard  General transfer comment to/from [facility]  Exercises  Exercises General Upper Extremity;General Lower Extremity  General Exercises - Upper Extremity  Shoulder Flexion AROM;Both;10 reps  General Exercises - Lower Extremity  Long Arc Quad AROM;Both;5 reps  OT - End of Session  Equipment Utilized During Treatment Oxygen  Activity Tolerance Patient tolerated treatment well  Patient left in chair;with call bell/phone within reach  Nurse Communication Mobility status  OT Assessment/Plan  OT Plan Discharge plan remains appropriate  OT Visit Diagnosis Unsteadiness on feet (R26.81);Muscle weakness (generalized) (M62.81)  OT Frequency (ACUTE ONLY) Min 2X/week  Follow Up Recommendations SNF;Supervision/Assistance - 24 hour (pt reports preference for home)  OT Equipment 3 in 1 bedside commode  AM-PAC OT "6 Clicks" Daily Activity Outcome Measure (Version 2)  Help from another person eating meals? 4  Help from another person taking care of personal grooming? 3  Help from another person toileting, which includes using toliet, bedpan, or urinal? 3  Help from another person bathing (including washing, rinsing, drying)? 3  Help from another person to put  on and taking off regular upper body  clothing? 3  Help from another person to put on and taking off regular lower body clothing? 2  6 Click Score 18  OT Goal Progression  Progress towards OT goals Progressing toward goals  Acute Rehab OT Goals  Patient Stated Goal return home and not go to rehab  OT Goal Formulation With patient  Time For Goal Achievement 04/03/19  Potential to Achieve Goals Good  OT Time Calculation  OT Start Time (ACUTE ONLY) 0830  OT Stop Time (ACUTE ONLY) 0848  OT Time Calculation (min) 18 min  OT General Charges  $OT Visit 1 Visit  OT Treatments  $Self Care/Home Management  8-22 mins    Lou Cal, OT Acute Rehabilitation Services Pager [phone] Office [phone]

## 2019-03-25 ENCOUNTER — Encounter (HOSPITAL_COMMUNITY)
Admission: EM | Disposition: A | Discharge status: Hospice | Payer: Self-pay | Source: Home / Self Care | Attending: Student

## 2019-03-25 DIAGNOSIS — I272 Pulmonary hypertension, unspecified: Secondary | ICD-10-CM

## 2019-03-25 DIAGNOSIS — I509 Heart failure, unspecified: Secondary | ICD-10-CM

## 2019-03-25 HISTORY — PX: RIGHT AND LEFT HEART CATH: CATH118262

## 2019-03-25 LAB — RENAL FUNCTION PANEL
Albumin: 3.5 g/dL (ref 3.5–5.0)
Anion gap: 14 (ref 5–15)
BUN: 31 mg/dL — ABNORMAL HIGH (ref 8–23)
CO2: 30 mmol/L (ref 22–32)
Calcium: 10 mg/dL (ref 8.9–10.3)
Chloride: 92 mmol/L — ABNORMAL LOW (ref 98–111)
Creatinine, Ser: 1.61 mg/dL — ABNORMAL HIGH (ref 0.44–1.00)
GFR calc Af Amer: 36 mL/min — ABNORMAL LOW (ref >60)
GFR calc non Af Amer: 31 mL/min — ABNORMAL LOW (ref >60)
Glucose, Bld: 110 mg/dL — ABNORMAL HIGH (ref 70–99)
Phosphorus: 3.7 mg/dL (ref 2.5–4.6)
Potassium: 3.8 mmol/L (ref 3.5–5.1)
Sodium: 136 mmol/L (ref 135–145)

## 2019-03-25 LAB — CBC
HCT: 32.6 % — ABNORMAL LOW (ref 36.0–46.0)
Hemoglobin: 10.5 g/dL — ABNORMAL LOW (ref 12.0–15.0)
MCH: 31.4 pg (ref 26.0–34.0)
MCHC: 32.2 g/dL (ref 30.0–36.0)
MCV: 97.6 fL (ref 80.0–100.0)
Platelets: 182 10*3/uL (ref 150–400)
RBC: 3.34 MIL/uL — ABNORMAL LOW (ref 3.87–5.11)
RDW: 15.9 % — ABNORMAL HIGH (ref 11.5–15.5)
WBC: 7.7 10*3/uL (ref 4.0–10.5)
nRBC: 0 % (ref 0.0–0.2)

## 2019-03-25 LAB — MAGNESIUM: Magnesium: 1.8 mg/dL (ref 1.7–2.4)

## 2019-03-25 LAB — COOXEMETRY PANEL
Carboxyhemoglobin: 1.8 % — ABNORMAL HIGH (ref 0.5–1.5)
Methemoglobin: 1.4 % (ref 0.0–1.5)
O2 Saturation: 59.9 %
Total hemoglobin: 10.7 g/dL — ABNORMAL LOW (ref 12.0–16.0)

## 2019-03-25 LAB — GLUCOSE, CAPILLARY
Glucose-Capillary: 62 mg/dL — ABNORMAL LOW (ref 70–99)
Glucose-Capillary: 158 mg/dL — ABNORMAL HIGH (ref 70–99)
Glucose-Capillary: 66 mg/dL — ABNORMAL LOW (ref 70–99)
Glucose-Capillary: 145 mg/dL — ABNORMAL HIGH (ref 70–99)
Glucose-Capillary: 163 mg/dL — ABNORMAL HIGH (ref 70–99)
Glucose-Capillary: 109 mg/dL — ABNORMAL HIGH (ref 70–99)

## 2019-03-25 LAB — APTT: aPTT: 107 seconds — ABNORMAL HIGH (ref 24–36)

## 2019-03-25 LAB — HEPARIN LEVEL (UNFRACTIONATED): Heparin Unfractionated: 1.76 IU/mL — ABNORMAL HIGH (ref 0.30–0.70)

## 2019-03-25 LAB — OCCULT BLOOD X 1 CARD TO LAB, STOOL: Fecal Occult Bld: POSITIVE — AB

## 2019-03-25 SURGERY — RIGHT AND LEFT HEART CATH

## 2019-03-25 MED ORDER — DEXTROSE 50 % IV SOLN
INTRAVENOUS | Status: AC
  Filled 2019-03-25: qty 50

## 2019-03-25 MED ORDER — LIDOCAINE HCL (PF) 1 % IJ SOLN
INTRAMUSCULAR | Status: DC | PRN
  Administered 2019-03-25 (×2): 2 mL

## 2019-03-25 MED ORDER — SODIUM CHLORIDE 0.9% FLUSH
3.0000 mL | Freq: Two times a day (BID) | INTRAVENOUS | Status: DC
  Administered 2019-03-25 – 2019-03-28 (×7): 3 mL via INTRAVENOUS

## 2019-03-25 MED ORDER — POTASSIUM CHLORIDE CRYS ER 20 MEQ PO TBCR
40.0000 meq | EXTENDED_RELEASE_TABLET | Freq: Once | ORAL | Status: AC
  Administered 2019-03-25: 40 meq via ORAL
  Filled 2019-03-25: qty 2

## 2019-03-25 MED ORDER — HEPARIN (PORCINE) IN NACL 1000-0.9 UT/500ML-% IV SOLN
INTRAVENOUS | Status: DC | PRN
  Administered 2019-03-25 (×2): 500 mL

## 2019-03-25 MED ORDER — SODIUM CHLORIDE 0.9 % IV SOLN: 250.0000 mL | INTRAVENOUS | Status: DC | PRN

## 2019-03-25 MED ORDER — SODIUM CHLORIDE 0.9 % IV SOLN: INTRAVENOUS | Status: DC

## 2019-03-25 MED ORDER — LABETALOL HCL 5 MG/ML IV SOLN: 10.0000 mg | INTRAVENOUS | Status: AC | PRN

## 2019-03-25 MED ORDER — HEPARIN SODIUM (PORCINE) 1000 UNIT/ML IJ SOLN
INTRAMUSCULAR | Status: DC | PRN
  Administered 2019-03-25: 4500 [IU] via INTRAVENOUS

## 2019-03-25 MED ORDER — DEXTROSE 5 % IV SOLN: INTRAVENOUS | Status: AC

## 2019-03-25 MED ORDER — LIDOCAINE HCL (PF) 1 % IJ SOLN
INTRAMUSCULAR | Status: AC
  Filled 2019-03-25: qty 30

## 2019-03-25 MED ORDER — VERAPAMIL HCL 2.5 MG/ML IV SOLN
INTRAVENOUS | Status: AC
  Filled 2019-03-25: qty 2

## 2019-03-25 MED ORDER — APIXABAN 5 MG PO TABS: 5.0000 mg | ORAL_TABLET | Freq: Two times a day (BID) | ORAL | Status: DC

## 2019-03-25 MED ORDER — HEPARIN SODIUM (PORCINE) 1000 UNIT/ML IJ SOLN
INTRAMUSCULAR | Status: AC
  Filled 2019-03-25: qty 1

## 2019-03-25 MED ORDER — FENTANYL CITRATE (PF) 100 MCG/2ML IJ SOLN
INTRAMUSCULAR | Status: DC | PRN
  Administered 2019-03-25: 25 ug via INTRAVENOUS

## 2019-03-25 MED ORDER — SODIUM CHLORIDE 0.9% FLUSH
3.0000 mL | INTRAVENOUS | Status: DC | PRN
  Administered 2019-03-28: 3 mL via INTRAVENOUS

## 2019-03-25 MED ORDER — TORSEMIDE 20 MG PO TABS
60.0000 mg | ORAL_TABLET | Freq: Every day | ORAL | Status: DC
  Administered 2019-03-25 – 2019-03-26 (×2): 60 mg via ORAL
  Filled 2019-03-25 (×2): qty 3

## 2019-03-25 MED ORDER — HYDRALAZINE HCL 20 MG/ML IJ SOLN: 10.0000 mg | INTRAMUSCULAR | Status: AC | PRN

## 2019-03-25 MED ORDER — FENTANYL CITRATE (PF) 100 MCG/2ML IJ SOLN
INTRAMUSCULAR | Status: AC
  Filled 2019-03-25: qty 2

## 2019-03-25 MED ORDER — MIDAZOLAM HCL 2 MG/2ML IJ SOLN
INTRAMUSCULAR | Status: AC
  Filled 2019-03-25: qty 2

## 2019-03-25 MED ORDER — HEPARIN (PORCINE) IN NACL 1000-0.9 UT/500ML-% IV SOLN
INTRAVENOUS | Status: AC
  Filled 2019-03-25: qty 1000

## 2019-03-25 MED ORDER — SODIUM CHLORIDE 0.9% FLUSH: 3.0000 mL | INTRAVENOUS | Status: DC | PRN

## 2019-03-25 MED ORDER — MIDAZOLAM HCL 2 MG/2ML IJ SOLN
INTRAMUSCULAR | Status: DC | PRN
  Administered 2019-03-25: 1 mg via INTRAVENOUS

## 2019-03-25 MED ORDER — ONDANSETRON HCL 4 MG/2ML IJ SOLN: 4.0000 mg | Freq: Four times a day (QID) | INTRAMUSCULAR | Status: DC | PRN

## 2019-03-25 MED ORDER — VERAPAMIL HCL 2.5 MG/ML IV SOLN
INTRAVENOUS | Status: DC | PRN
  Administered 2019-03-25: 10 mL via INTRA_ARTERIAL

## 2019-03-25 MED ORDER — ACETAMINOPHEN 325 MG PO TABS: 650.0000 mg | ORAL_TABLET | ORAL | Status: DC | PRN

## 2019-03-25 MED ORDER — APIXABAN 5 MG PO TABS
5.0000 mg | ORAL_TABLET | Freq: Two times a day (BID) | ORAL | Status: DC
  Administered 2019-03-25 – 2019-03-28 (×6): 5 mg via ORAL
  Filled 2019-03-25 (×7): qty 1

## 2019-03-25 MED ORDER — DOBUTAMINE IN D5W 4-5 MG/ML-% IV SOLN
2.5000 ug/kg/min | INTRAVENOUS | Status: DC
  Administered 2019-03-25: 2.5 ug/kg/min via INTRAVENOUS
  Filled 2019-03-25: qty 250

## 2019-03-25 SURGICAL SUPPLY — 14 items
CATH BALLN WEDGE 5F 110CM (CATHETERS) ×2 IMPLANT
CATH INFINITI 5FR ANG PIGTAIL (CATHETERS) ×2 IMPLANT
CATH INFINITI JR4 5F (CATHETERS) ×2 IMPLANT
DEVICE RAD COMP TR BAND LRG (VASCULAR PRODUCTS) ×2 IMPLANT
GLIDESHEATH SLEND SS 6F .021 (SHEATH) ×2 IMPLANT
GUIDEWIRE INQWIRE 1.5J.035X260 (WIRE) IMPLANT
INQWIRE 1.5J .035X260CM (WIRE) ×3
KIT HEART LEFT (KITS) ×3 IMPLANT
PACK CARDIAC CATHETERIZATION (CUSTOM PROCEDURE TRAY) ×3 IMPLANT
SHEATH GLIDE SLENDER 4/5FR (SHEATH) ×2 IMPLANT
TRANSDUCER W/STOPCOCK (MISCELLANEOUS) ×5 IMPLANT
TUBING ART PRESS 72  MALE/FEM (TUBING) ×6
TUBING ART PRESS 72 MALE/FEM (TUBING) IMPLANT
WIRE HI TORQ VERSACORE-J 145CM (WIRE) ×2 IMPLANT

## 2019-03-25 NOTE — Progress Notes (Signed)
ANTICOAGULATION CONSULT NOTE - Follow Up  Consult  Pharmacy Consult for Heparin>>apixaban Indication: atrial fibrillation  Allergies  Allergen Reactions  . Penicillins Hives    Has patient had a PCN reaction causing immediate rash, facial/tongue/throat swelling, SOB or lightheadedness with hypotension: Yes Has patient had a PCN reaction causing severe rash involving mucus membranes or skin necrosis: No Has patient had a PCN reaction that required hospitalization No Has patient had a PCN reaction occurring within the last 10 years: No If all of the above answers are "NO", then may proceed with Cephalosporin use.     Patient Measurements: Height: _0  (160 cm) Weight: 176 lb 6.4 oz (80 kg) IBW/kg (Calculated) : 52.4 Heparin Dosing Weight: 74 kg  Vital Signs: Temp: 98.3 F (36.8 C) (03/15 0400) Temp Source: Oral (03/15 0400) BP: 134/100 (03/15 0916) Pulse Rate: 119 (03/15 0849)  Labs: Recent Labs    03/23/19 0542 03/23/19 0542 03/24/19 0552 03/24/19 0924 03/24/19 1847 03/25/19 0601 03/25/19 0602  HGB 9.1*   < > 9.4*  --   --  10.5*  --   HCT 27.6*  --  29.9*  --   --  32.6*  --   PLT 163  --  165  --   --  182  --   APTT  --   --   --  35 96* 107*  --   HEPARINUNFRC  --   --   --  >2.20* >2.20*  --  1.76*  CREATININE 1.88*  --  1.60*  --   --   --  1.61*   < > = values in this interval not displayed.    Estimated Creatinine Clearance: 30.2 mL/min (A) (by C-G formula based on SCr of 1.61 mg/dL (H)).   Medical History: Past Medical History:  Diagnosis Date  . AKI (acute kidney injury) ([place]) 03/18/2019  . Allergic rhinitis   . Allergy   . Anxiety   . Atrial fibrillation ([place])   . Carpal tunnel syndrome    left  . Cataract   . Depression   . Diabetic neuropathy ([place])   . Dyspnea   . Essential hypertension   . GERD (gastroesophageal reflux disease)   . Mixed hyperlipidemia   . Normocytic anemia 12/18/2018  . PAF (paroxysmal atrial fibrillation) ([place])    a.  s/p TEE cardioversion March 2017. b. Recurrence by event monitor 04/2016.  Marland Kitchen Pneumonia   . PONV (postoperative nausea and vomiting)    after previous bronchoscopy  . RBBB   . Restless leg syndrome   . Type 2 diabetes mellitus ([place])   . Uterine cancer [facility])     Assessment: 74 yo female with atrial fibrillation on Eliquis PTA to start heparin 3/14 for LHC/RHC on 3/15. Patient appears to have been in atrial flutter since at least 12/20. Planning for eventual TEE/DCCV once titrated off dobutamine. Last dose of Eliquis was given on 03/23/19 at 2149.     S/p cath this am showing primarily pulmonary venous hypertension, moderate MS. CBC stable this morning on heparin, no bleeding issues noted.   Goal of Therapy:  Monitor platelets by anticoagulation protocol: Yes  Plan:  Restart apixaban tonight Planning TEE/DCCV later this week  Erin Hearing PharmD., BCPS Clinical Pharmacist 03/25/2019 9:55 AM  Please check [email or web address] for unit-specific pharmacy phone numbers.

## 2019-03-25 NOTE — Progress Notes (Signed)
Physical Therapy Treatment Patient Details Name: Ellen Jordan MRN: [id number] DOB: 1945-10-15 Today's Date: 03/25/2019    History of Present Illness Ellen Jordan  is a 74 y.o. female, with history of chronic pain atrial fibrillation, on chronic anticoagulation with Eliquis, diabetes mellitus type 2, anxiety, hypertension, pulmonary hypertension, came to hospital with worsening shortness of breath.  Patient says that shortness of breath got worse over the past few days.  She also complains of rectal bleeding tonight that occurs occasionally when she has hard bowel movement.  Denies chest pain, abdominal pain or dysuria.  She is followed by pulmonology for COPD and medication induced interstitial lung disease. Pt transferred to [facility] on 3/9.  Pt s/p R heart cath 03/25/19.    PT Comments    Treatment was limited today due to lines/leads, orders for limited use R UE s/p heart cath, and tachycardia. Pt was able to transfer to [facility] and ambulate 6' to chair with L HHA and cues for posture, lines/leads, and safety.  HR was 118-132 bpm during therapy.  Pt positioned in chair with R UE elevated.     Follow Up Recommendations  SNF     Equipment Recommendations  Other (comment)(defer to next venue)    Recommendations for Other Services       Precautions / Restrictions Precautions Precautions: Fall    Mobility  Bed Mobility Overal bed mobility: Needs Assistance Bed Mobility: Supine to Sit     Supine to sit: Min assist     General bed mobility comments: min A to boost trunk - limited due to limited use R UE from heart catherization this am (arm band has been removed)  Transfers Overall transfer level: Needs assistance Equipment used: 1 person hand held assist Transfers: Sit to/from [facility] Sit to Stand: Min assist Stand pivot transfers: Min guard       General transfer comment: sit to stand x 2 - min A to boost; cues for posture and safe hand  placement  Ambulation/Gait Ambulation/Gait assistance: Min guard Gait Distance (Feet): 6 Feet Assistive device: 1 person hand held assist Gait Pattern/deviations: Wide base of support;Decreased stride length;Shuffle Gait velocity: reduced   General Gait Details: limited due to HR and lines/leads; only ambulated bsc to chair; cued for posture   Stairs             Wheelchair Mobility    Modified Rankin (Stroke Patients Only)       Balance Overall balance assessment: Needs assistance Sitting-balance support: No upper extremity supported;Feet supported Sitting balance-Leahy Scale: Good     Standing balance support: No upper extremity supported;During functional activity Standing balance-Leahy Scale: Fair                              Cognition Arousal/Alertness: Awake/alert Behavior During Therapy: WFL for tasks assessed/performed Overall Cognitive Status: Within Functional Limits for tasks assessed                                        Exercises      General Comments General comments (skin integrity, edema, etc.): On RA O2 sats stable.  HR was 118-132 bpm - limited activity to transfers/short ambulation only.      Pertinent Vitals/Pain Pain Assessment: No/denies pain    Home Living  Prior Function            PT Goals (current goals can now be found in the care plan section) Progress towards PT goals: Progressing toward goals    Frequency    Min 3X/week      PT Plan Current plan remains appropriate    Co-evaluation              AM-PAC PT "6 Clicks" Mobility   Outcome Measure  Help needed turning from your back to your side while in a flat bed without using bedrails?: None Help needed moving from lying on your back to sitting on the side of a flat bed without using bedrails?: A Little Help needed moving to and from a bed to a chair (including a wheelchair)?: A Little Help needed  standing up from a chair using your arms (e.g., wheelchair or bedside chair)?: A Little Help needed to walk in hospital room?: A Little Help needed climbing 3-5 steps with a railing? : A Lot 6 Click Score: 18    End of Session Equipment Utilized During Treatment: Oxygen Activity Tolerance: Patient limited by fatigue(limited by tachycardia and lines/leads) Patient left: in chair;with call bell/phone within reach;with family/visitor present Nurse Communication: Mobility status PT Visit Diagnosis: Unsteadiness on feet (R26.81);Other abnormalities of gait and mobility (R26.89);Muscle weakness (generalized) (M62.81)     Time: 1700-1720 PT Time Calculation (min) (ACUTE ONLY): 20 min  Charges:  $Therapeutic Activity: 8-22 mins                     Maggie Font, PT Acute Rehab Services Pager [phone] [facility] [phone] [facility] [phone]    Karlton Lemon 03/25/2019, 5:43 PM

## 2019-03-25 NOTE — Progress Notes (Signed)
Patient TR band removed, site level zero, gauze and tegrederm applied to site.

## 2019-03-25 NOTE — Progress Notes (Signed)
Chaplain came to room with notary and witnesses but Ellen Jordan stated that she is not able to move her hands at all today.  Chaplain will follow-up.

## 2019-03-25 NOTE — Progress Notes (Signed)
Chaplain is working to obtain witnesses and notary still for Scientist, physiological.  Chaplain has touched based with Mrs. Claar about the process.  Chaplain will continue to follow-up.

## 2019-03-25 NOTE — Progress Notes (Addendum)
Patient ID: Ellen Jordan, female   DOB: 10/19/1945, 74 y.o.   MRN: [id number]     Advanced Heart Failure Rounding Note  PCP-Cardiologist: Rozann Lesches, MD   Subjective:    Now on dobutamine 5 mcg/kg/min with co-ox 67%.  Creatinine trending down, 1.61 today.  Weight down around 30 lbs so far.   Breathing is better.   She remains in atrial fibrillation, rate 100s on amiodarone gtt.    RHC Procedural Findings (on dobutamine 5 mcg/kg/min): Hemodynamics (mmHg) RA mean 9 RV 48/4 PA 54/24, mean 34 PCWP mean 18 LV 101/7 AO 95/51 Oxygen saturations: PA 64% AO 100% Cardiac Output (Fick) 4.89  Cardiac Index (Fick) 2.59 PVR 3.27 WU Mitral valve mean gradient 11.5 mmHg Mitral valve area 1.67 cm^2   Objective:   Weight Range: 80 kg Body mass index is 31.25 kg/m.   Vital Signs:   Temp:  [98.2 F (36.8 C)-98.3 F (36.8 C)] 98.3 F (36.8 C) (03/15 0400) Pulse Rate:  [93-126] 119 (03/15 0849) Resp:  [13-24] 15 (03/15 0849) BP: (134-172)/(65-100) 134/67 (03/15 0849) SpO2:  [97 %-100 %] 99 % (03/15 0849) Weight:  [80 kg] 80 kg (03/15 0618) Last BM Date: 03/24/19  Weight change: Filed Weights   03/23/19 0400 03/24/19 0100 03/25/19 0618  Weight: 85.9 kg 86.1 kg 80 kg    Intake/Output:   Intake/Output Summary (Last 24 hours) at 03/25/2019 0857 Last data filed at 03/25/2019 0500 Gross per 24 hour  Intake 1174.38 ml  Output 1900 ml  Net -725.62 ml      Physical Exam   General: NAD Neck: JVP 8-9 cm, no thyromegaly or thyroid nodule.  Lungs: Clear to auscultation bilaterally with normal respiratory effort. CV: Nondisplaced PMI.  Heart mildly tachy, irregular S1/S2, no S3/S4, 1/6 SEM RUSB.  Trace ankle edema.    Abdomen: Soft, nontender, no hepatosplenomegaly, no distention.  Skin: Intact without lesions or rashes.  Neurologic: Alert and oriented x 3.  Psych: Normal affect. Extremities: No clubbing or cyanosis.  HEENT: Normal.    Telemetry   Atrial  fibrillation 100s (personally reviewed)  EKG   n/a   Labs    CBC Recent Labs    03/24/19 0552 03/25/19 0601  WBC 5.6 7.7  HGB 9.4* 10.5*  HCT 29.9* 32.6*  MCV 96.5 97.6  PLT 165 831   Basic Metabolic Panel Recent Labs    03/24/19 0552 03/25/19 0601 03/25/19 0602  NA 136  --  136  K 3.9  --  3.8  CL 94*  --  92*  CO2 31  --  30  GLUCOSE 157*  --  110*  BUN 39*  --  31*  CREATININE 1.60*  --  1.61*  CALCIUM 10.1  --  10.0  MG 1.6* 1.8  --   PHOS 4.1  --  3.7   Liver Function Tests Recent Labs    03/24/19 0552 03/25/19 0602  ALBUMIN 3.3* 3.5   No results for input(s): LIPASE, AMYLASE in the last 72 hours. Cardiac Enzymes No results for input(s): CKTOTAL, CKMB, CKMBINDEX, TROPONINI in the last 72 hours.  BNP: BNP (last 3 results) Recent Labs    03/17/19 2213  BNP 338.0*    ProBNP (last 3 results) Recent Labs    09/12/18 1201  PROBNP 51.0     D-Dimer No results for input(s): DDIMER in the last 72 hours. Hemoglobin A1C No results for input(s): HGBA1C in the last 72 hours. Fasting Lipid Panel No results for input(s):  CHOL, HDL, LDLCALC, TRIG, CHOLHDL, LDLDIRECT in the last 72 hours. Thyroid Function Tests No results for input(s): TSH, T4TOTAL, T3FREE, THYROIDAB in the last 72 hours.  Invalid input(s): FREET3  Other results:   Imaging    No results found.   Medications:     Scheduled Medications: . [MAR Hold] acetaminophen  1,000 mg Oral Q8H  . [MAR Hold] atorvastatin  40 mg Oral Daily  . [MAR Hold] Chlorhexidine Gluconate Cloth  6 each Topical Daily  . [MAR Hold] darbepoetin (ARANESP) injection - NON-DIALYSIS  60 mcg Subcutaneous Q Wed-1800  . dextrose      . [MAR Hold] fluticasone  2 spray Each Nare Daily  . [MAR Hold] fluticasone furoate-vilanterol  1 puff Inhalation Daily  . [MAR Hold] insulin aspart  0-5 Units Subcutaneous QHS  . [MAR Hold] insulin aspart  0-9 Units Subcutaneous TID WC  . [MAR Hold] insulin detemir  30 Units  Subcutaneous BID  . [MAR Hold] loratadine  10 mg Oral Daily  . [MAR Hold] mouth rinse  15 mL Mouth Rinse BID  . [MAR Hold] pantoprazole  40 mg Oral Daily  . [MAR Hold] ramelteon  8 mg Oral QHS  . [MAR Hold] sertraline  100 mg Oral Daily  . [MAR Hold] sodium chloride flush  10-40 mL Intracatheter Q12H  . [MAR Hold] sodium chloride flush  3 mL Intravenous Q12H  . [MAR Hold] sodium chloride flush  3 mL Intravenous Q12H    Infusions: . [MAR Hold] sodium chloride    . [MAR Hold] sodium chloride 250 mL (03/24/19 0700)  . sodium chloride    . sodium chloride 10 mL/hr at 03/25/19 0520  . amiodarone 30 mg/hr (03/24/19 2146)  . dextrose    . [MAR Hold] DOBUTamine 5 mcg/kg/min (03/24/19 0854)  . heparin 1,100 Units/hr (03/24/19 1123)    PRN Medications: [MAR Hold] sodium chloride, [MAR Hold] sodium chloride, sodium chloride, [MAR Hold] albuterol, [MAR Hold] ALPRAZolam, [MAR Hold] camphor-menthol, fentaNYL, Heparin (Porcine) in NaCl, heparin, [MAR Hold] ipratropium-albuterol, lidocaine (PF), [MAR Hold] liver oil-zinc oxide, [MAR Hold] loperamide, midazolam, Radial Cocktail/Verapamil only, [MAR Hold] sodium chloride flush, [MAR Hold] sodium chloride flush, sodium chloride flush   Assessment/Plan   1. RV failure: Echo this admission shows LV EF 65-70% with RV moderate-severely dilated and moderate dysfunction, D-shaped septum, the mitral valve is severely calcified though mean gradient is only about 5 mmHg suggesting mild to moderate mitral stenosis (pressure half-time does not appear short), mild AS.  The patient was admitted markedly volume overloaded.  I am concerned that she has end-stage RV dysfunction.  She also has mitral stenosis which visually looks significant but measures only mild-moderate by mean gradient and pressure half-time on echo.  She has been oliguric with creatinine up to 4.27 => down to 1.6 today on dobutamine.  Given her markedly low GFR and soft BP at times,  She was started on  dobutamine, decreased to 5 mcg/kg/min yesterday. RHC/LHC today showed mildly elevated PCWP with moderate primarily pulmonary venous hypertension. Mitral stenosis appears moderate.  - Decrease dobutamine to 2.5 mcg/kg/min today.  - Start torsemide 60 mg daily today.   - Suspect prognosis is overall will be poor with severe RV failure.  Will need to see if we can maintain improvement after weaning her off dobutamine.  2. Pulmonary hypertension: PASP estimated at 41 mmHg on echo this admission and 42 mmHg on echo in 2/20.  However, there has been suspicion for severe pulmonary hypertension based on dilated  and dysfunctional RV.  There was concern for group 3 PH but recent high resolution chest CT in 2/21 does not show marked lung abnormality (there does not seem to be severe emphysema or interstitial lung disease). RHC/LHC today suggests primarily pulmonary venous hypertension due to significant mitral stenosis.  3. Mitral stenosis: The valve looks severely calcified and restricted, but hemodynamics by echo were not that impressive.  Mean gradient 5 mmHg and MVA 2.66 cm^2 by PHT suggested no more than mild mitral stenosis.  However, cath today is suggestive of moderate mitral stenosis with mean gradient 11.5 mmHg and MVA 1.67 cm^2.   - She does not appear to be a valvuloplasty candidate due to her degree of calcification, and she would be a difficult surgical candidate (poor functional status).  - Will assess the mitral valve more closely later this week with TEE at time of DCCV.  4. Atrial flutter: The patient appears to have been in atrial flutter since at least 12/20.  Mild RVR.   - Off atenolol with AKI.  - Control HR with amiodarone gtt while on inotrope.  - Will work on titrating off dobutamine and will start apixaban.  Possible TEE-guided DCCV on Wednesday.   5. Acute on CKD stage 3: Creatinine peaked at 4.3. Todays creatinine is down to 1.6. Suspect cardiorenal syndrome in setting of RV failure.   She would be a poor candidate for long-term HD with RV failure.  6. Chronic hypoxemic respiratory failure:  She has a history of smoking, but high resolution CT lungs does not show clear evidence of severe COPD or of interstitial lung disease.   7. Hyponatremia: Sodium up with tolvaptan dose on 3/11.  Fluid restrict.    Mobilize out of bed with PT.   Length of Stay: 7  [name], MD  03/25/2019, 8:57 AM  Advanced Heart Failure Team Pager [phone] (M-F; 7a - 4p)  Please contact [facility] for night-coverage after hours (4p -7a ) and weekends on [email or web address]

## 2019-03-25 NOTE — Progress Notes (Signed)
Daily Progress Note   Patient Name: Ellen Jordan       Date: 03/25/2019 DOB: 1945/11/24  Age: 74 y.o. MRN#: [id number] Attending Physician: Bonnell Public, MD Primary Care Physician: [name], MD Admit Date: 03/17/2019  Reason for Consultation/Follow-up: Establishing goals of care and Psychosocial/spiritual support  Subjective: Met Ellen Jordan at bedside she appears exhausted/down and says she can't use her hands because of the procedures this am.  She states she just wants to go home.   She feels as though she received 1 report from the RN about her heart cath results and a different report from her husband.  She would like to hear about the cath results from the HF team directly.  We spoke about a possible cardioversion on Wednesday.  She said she had one 4 or 5 years ago and is familiar with the procedure.  Otherwise she is doing ok.  She wants to go home with hospice when able.  Assessment: Awake orientated appears down and tired today.  Hopeful to go home soon.  Deconditioned and fatigued.  No other complaints.   Patient Profile/HPI:  74 y.o. female  with past medical history of COPD on 2L oxygen, afib on Eliquis, diastolic CHF, pulmonary hypertension, CKD admitted on 03/17/2019 with recurrent falls and weakness. Hospital admission for acute on chronic diastolic CHF/RV failure with pulmonary hypertension and mitral valve stenosis. Heart failure team following. Patient receiving IV dobutamine, IV lasix, and metolazone. AKI on CKD stage 3, creat on admit 4.37. Nephrology following. Possibly cardiorenal. Patient is not a candidate for HD with RV failure. Tentative RHC this admit. Palliative medicine consultation for goals of care.    Length of Stay: 7  Current Medications: Scheduled  Meds:  . acetaminophen  1,000 mg Oral Q8H  . apixaban  5 mg Oral BID  . atorvastatin  40 mg Oral Daily  . Chlorhexidine Gluconate Cloth  6 each Topical Daily  . darbepoetin (ARANESP) injection - NON-DIALYSIS  60 mcg Subcutaneous Q Wed-1800  . dextrose      . fluticasone  2 spray Each Nare Daily  . fluticasone furoate-vilanterol  1 puff Inhalation Daily  . insulin aspart  0-5 Units Subcutaneous QHS  . insulin aspart  0-9 Units Subcutaneous TID WC  . insulin detemir  30 Units Subcutaneous BID  .  loratadine  10 mg Oral Daily  . mouth rinse  15 mL Mouth Rinse BID  . pantoprazole  40 mg Oral Daily  . ramelteon  8 mg Oral QHS  . sertraline  100 mg Oral Daily  . sodium chloride flush  10-40 mL Intracatheter Q12H  . sodium chloride flush  3 mL Intravenous Q12H  . sodium chloride flush  3 mL Intravenous Q12H  . sodium chloride flush  3 mL Intravenous Q12H  . torsemide  60 mg Oral Daily    Continuous Infusions: . sodium chloride    . sodium chloride 250 mL (03/24/19 0700)  . sodium chloride    . amiodarone 30 mg/hr (03/25/19 1021)  . DOBUTamine 2.5 mcg/kg/min (03/25/19 1019)    PRN Meds: sodium chloride, sodium chloride, sodium chloride, acetaminophen, albuterol, ALPRAZolam, camphor-menthol, ipratropium-albuterol, liver oil-zinc oxide, loperamide, ondansetron (ZOFRAN) IV, sodium chloride flush, sodium chloride flush, sodium chloride flush  Physical Exam       Well developed elderly female, awake, alert, always pleasant, but seems down today. NAD.   Rash on skin appears less erythematous.  Vital Signs: BP (!) 143/89   Pulse 100   Temp 98.3 F (36.8 C) (Oral)   Resp 17   Ht _0  (1.6 m)   Wt 80 kg   SpO2 93%   BMI 31.25 kg/m  SpO2: SpO2: 93 % O2 Device: O2 Device: Nasal Cannula O2 Flow Rate: O2 Flow Rate (L/min): 2 L/min  Intake/output summary:   Intake/Output Summary (Last 24 hours) at 03/25/2019 1621 Last data filed at 03/25/2019 1354 Gross per 24 hour  Intake 994.38  ml  Output 1000 ml  Net -5.62 ml   LBM: Last BM Date: 03/24/19 Baseline Weight: Weight: 79.4 kg Most recent weight: Weight: 80 kg       Palliative Assessment/Data:  40%     Patient Active Problem List   Diagnosis Date Noted  . Palliative care by specialist   . Goals of care, counseling/discussion   . Chronic atrial fibrillation ([place])   . Acute respiratory failure with hypoxemia ([place])   . RVF (right ventricular failure) ([place])   . AKI (acute kidney injury) ([place]) 03/18/2019  . Uncontrolled type 2 diabetes mellitus with hyperglycemia ([place]) 02/19/2019  . Atrial fibrillation with RVR ([place]) 12/18/2018  . Hypokalemia 12/18/2018  . Normocytic anemia 12/18/2018  . Mediastinal lymphadenopathy 12/18/2018  . Pulmonary nodules 12/18/2018  . Pulmonary hypertension ([place]) 09/08/2018  . History of tobacco abuse 11/09/2016  . Controlled diabetes mellitus type 2 with complications ([place]) 82/06/154  . Restless leg syndrome 11/04/2016  . PAF (paroxysmal atrial fibrillation) ([employer]) 06/03/2016  . RBBB 06/03/2016  . SOB (shortness of breath)   . Atrial fibrillation with rapid ventricular response ([place]) 02/28/2015  . Uncontrolled type 2 diabetes mellitus without complication, with long-term current use of insulin 12/24/2014  . Mixed hyperlipidemia 12/24/2014  . Essential hypertension, benign 12/24/2014    Palliative Care Plan    Recommendations/Plan:  Patient would like to hear results of her heart cath from Heart Failure Team.  Anticipate DCCV on Wednesday.    Medical team concerned she may decompensate when weaned off dobutamine.  Severe RV failure.    Hopeful for improvement, but wants to go home with Hospice either way.    PMT will follow up on Wednesday.  Goals of Care and Additional Recommendations:  Limitations on Scope of Treatment: Avoid Hospitalization  Code Status:  DNR  Prognosis:   < 6 months at high risk for  decompensation given severe right heart failure, afib,   ILD, overall deconditioning, and CKD.   Discharge Planning:  Home with Hospice  Care plan was discussed with patient.  Thank you for allowing the Palliative Medicine Team to assist in the care of this patient.  Total time spent:  25 min.     Greater than 50%  of this time was spent counseling and coordinating care related to the above assessment and plan.  Florentina Jenny, PA-C Palliative Medicine  Please contact Palliative MedicineTeam phone at [phone] for questions and concerns between 7 am - 7 pm.   Please see AMION for individual provider pager numbers.

## 2019-03-25 NOTE — Progress Notes (Signed)
OT Cancellation Note  Patient Details Name: Ellen Jordan MRN: [id number] DOB: 1945/10/22   Cancelled Treatment:     Attempt to see patient this PM however patient went for heart cath earlier today and still has clear arm band on R wrist and is unable to bear weight/mobilize until removed. Will re-attempt as schedule permits.  [facility] OT office: [place] 03/25/2019, 1:22 PM

## 2019-03-25 NOTE — Interval H&P Note (Signed)
History and Physical Interval Note:  03/25/2019 8:20 AM  Ellen Jordan  has presented today for surgery, with the diagnosis of Heart failure.  The various methods of treatment have been discussed with the patient and family. After consideration of risks, benefits and other options for treatment, the patient has consented to  Procedure(s): RIGHT/LEFT HEART CATH AND CORONARY ANGIOGRAPHY (N/A) as a surgical intervention.  The patient's history has been reviewed, patient examined, no change in status, stable for surgery.  I have reviewed the patient's chart and labs.  Questions were answered to the patient's satisfaction.     Ellen Jordan [employer]

## 2019-03-25 NOTE — Progress Notes (Signed)
PROGRESS NOTE  Ellen Jordan FBS:[id number] DOB: 1945-09-25   PCP: [name], MD  Patient is from: Home.  Lives with her husband.  Recently started using walker due to recurrent fall and weakness.  DOA: 03/17/2019 LOS: 7  Brief Narrative / Interim history: Patient is a 74 year old female with past medical history significant for COPD on 2 L, ILD, A. fib on Eliquis, diastolic CHF, Pulm HTN, DM-2, CKD-3B, HTN and obesity presented to [facility] with progressive shortness of breath and rectal bleed and admitted for acute on chronic renal failure and acute on chronic diastolic CHF.   At [facility] ED, Cr 2.99 (baseline about 1.4).  CXR with mild pulmonary vascular congestion.  Cardiology consulted there but not able to diurese patient due to worsening renal function.  She was transferred to [facility] for nephrology input.  Nephrology, advanced heart failure team, PCCM and palliative care consulted.  Started on high-dose IV Lasix with dobutamine.  03/25/2019: Patient seen alongside patient's nurse.  Patient underwent right heart catheterization (see documentation by cardiology team).  Symptoms continue to improve.  Serum creatinine has remained stable at 1.61.  Subjective: No new complaints. No worsening of shortness of breath No chest pain  Objective: Vitals:   03/25/19 0849 03/25/19 0916 03/25/19 1000 03/25/19 1031  BP: 134/67 (!) 134/100 (!) 154/81 (!) 143/89  Pulse: (!) 119  (!) 109 100  Resp: _0 Temp:      TempSrc:      SpO2: 99%  96% 93%  Weight:      Height:        Intake/Output Summary (Last 24 hours) at 03/25/2019 1624 Last data filed at 03/25/2019 1354 Gross per 24 hour  Intake 994.38 ml  Output 1000 ml  Net -5.62 ml   Filed Weights   03/23/19 0400 03/24/19 0100 03/25/19 0618  Weight: 85.9 kg 86.1 kg 80 kg    Examination:  GENERAL: No acute distress.  Appears well.  HEENT: Mild pallor.  No jaundice.    NECK: Supple.  No apparent JVD.  RESP:  Clear to auscultation.   CVS: S1-S2.   ABD/GI/GU: Bowel sounds present. Soft. Non tender.  EXT:  Unna boots over mild bilateral lower extremity edema. NEURO: Patient is awake and alert.  Patient moves all extremities.  Procedures:  None  Assessment & Plan: Acute on chronic diastolic CHF/RV failure/Pulm HTN:  -Echo revealed EF of 60 to 65%, indeterminate diastolic function and interventricular septum flattening in systole and diastole consistent with right ventricular pressure and volume overload.  -Patient underwent right heart catheterization today, 03/25/2019. -Responding to dobutamine and IV Lasix.   -Advanced HF team managing-on dobutamine and diuretics. -Renal function has improved. -Continue to monitor fluid status, renal function and electrolytes.  Mitral valve stenosis: -Patient underwent right cardiac catheterization today.   -Per cardiology-not a good candidate for valve replacement  Atrial flutter with wide-QRS and QTc: -Continue to optimize heart rate.  -Started on amiodarone drip while on inotropes. -On Eliquis for anticoagulation. -Optimize electrolytes -Possible DCCV down the road when off dobutamine.  AKI on CKD-3B/azotemia:  -Cr 1.4 (on 02/14/2019)> 2.99 (admit)>> 4.37> 4.27> 3.79> 2.55> 1.88.  BUN 44>> 66> 59> 49.  -Likely cardiorenal.   -AKI has improved significantly.  Serum creatinine is down to 1.61.    Chronic COPD/respiratory failure on 2 L by nasal cannula at baseline: -Continue Breo Ellipta, duo nebs and supplemental oxygen  Controlled DM-2 with hyperglycemia: -A1c 6.7%.  On  Levemir 15 units nightly and NovoLog 10 to 16 units 3 times daily at home. Recent Labs    03/25/19 0655 03/25/19 1140 03/25/19 1239  GLUCAP 158* 66* 145*  -Continue Levemir to 30 units twice daily, SSI-thin and a statin  Normocytic anemia: Baseline Hgb 9-10> 9.4 (admit)> 9.5> 8.9> 8.7> 9.1.  Iron sat 10%.  TIBC 473.  Mention of rectal bleed on presentation.  Husband thinks she  might have wiped herself hard.  She is on Eliquis. -Continue monitoring -IV iron and ESA per nephrology.  Hypervolemic hyponatremia-likely due to CHF or renal failure.  Resolved. -Received tolvaptan once on 3/11.  Anxiety and depression/insomnia: Stable -Continue Zoloft and Xanax. -Add ramelteon at night.  Allergic rhinitis -Flonase and loratadine  Hyperlipidemia -Fasting lipid panel in the morning -continue statins  GERD -continue protonix  Class II obesity: Body mass index is 31.25 kg/m.  Weight improved after diuretics. -Encourage lifestyle change to lose weight -May benefit from newer agents with cardiovascular benefits  Pulmonary nodules: Noted on high-resolution CT on 02/19/2019 -Follow-up CT in 12 months as recommended.  Liver cirrhosis?  Noted on high-resolution CT last months.  Debility/Generalized weakness: recently started using walker recently -PT/OT-recommended SNF but she wished to go home with home hospice when ready  Goal of care: DNR/DNI appropriately.  Multiple comorbidities as above.  Not a candidate for dialysis. -Palliative care consulted.    DVT prophylaxis: On Eliquis for A. fib Code Status: DNR/DNI Family Communication: Patient and RN.  No family member at bedside today.  Discharge barrier: Evaluation and treatment of acute renal failure and decompensated CHF.  On inotropes and IV diuretics Patient is from: Home Final disposition: Likely home with home hospice when medically stable and cleared by cardiology.  Consultants:  nephrology (off), PCCM (off), advanced HF team and palliative care   Microbiology summarized: COVID-19 screen negative.  Sch Meds:  Scheduled Meds: . acetaminophen  1,000 mg Oral Q8H  . apixaban  5 mg Oral BID  . atorvastatin  40 mg Oral Daily  . Chlorhexidine Gluconate Cloth  6 each Topical Daily  . darbepoetin (ARANESP) injection - NON-DIALYSIS  60 mcg Subcutaneous Q Wed-1800  . dextrose      . fluticasone  2 spray  Each Nare Daily  . fluticasone furoate-vilanterol  1 puff Inhalation Daily  . insulin aspart  0-5 Units Subcutaneous QHS  . insulin aspart  0-9 Units Subcutaneous TID WC  . insulin detemir  30 Units Subcutaneous BID  . loratadine  10 mg Oral Daily  . mouth rinse  15 mL Mouth Rinse BID  . pantoprazole  40 mg Oral Daily  . ramelteon  8 mg Oral QHS  . sertraline  100 mg Oral Daily  . sodium chloride flush  10-40 mL Intracatheter Q12H  . sodium chloride flush  3 mL Intravenous Q12H  . sodium chloride flush  3 mL Intravenous Q12H  . sodium chloride flush  3 mL Intravenous Q12H  . torsemide  60 mg Oral Daily   Continuous Infusions: . sodium chloride    . sodium chloride 250 mL (03/24/19 0700)  . sodium chloride    . amiodarone 30 mg/hr (03/25/19 1021)  . DOBUTamine 2.5 mcg/kg/min (03/25/19 1019)   PRN Meds:.sodium chloride, sodium chloride, sodium chloride, acetaminophen, albuterol, ALPRAZolam, camphor-menthol, ipratropium-albuterol, liver oil-zinc oxide, loperamide, ondansetron (ZOFRAN) IV, sodium chloride flush, sodium chloride flush, sodium chloride flush  Antimicrobials: Anti-infectives (From admission, onward)   None       I have personally  reviewed the following labs and images: CBC: Recent Labs  Lab 03/21/19 0630 03/22/19 0439 03/23/19 0542 03/24/19 0552 03/25/19 0601  WBC 6.3 5.4 5.4 5.6 7.7  HGB 8.9* 8.7* 9.1* 9.4* 10.5*  HCT 27.9* 27.0* 27.6* 29.9* 32.6*  MCV 94.3 95.1 94.2 96.5 97.6  PLT 154 153 163 165 182   BMP &GFR Recent Labs  Lab 03/21/19 0630 03/21/19 0630 03/21/19 1722 03/22/19 0439 03/23/19 0542 03/24/19 0552 03/25/19 0601 03/25/19 0602  NA 126*   < > 127* 130* 137 136  --  136  K 4.6  --   --  4.0 3.4* 3.9  --  3.8  CL 93*  --   --  93* 96* 94*  --  92*  CO2 20*  --   --  _0 --  30  GLUCOSE 180*  --   --  134* 79 157*  --  110*  BUN 66*  --   --  59* 49* 39*  --  31*  CREATININE 3.79*  --   --  2.55* 1.88* 1.60*  --  1.61*  CALCIUM  8.9  --   --  9.7 10.1 10.1  --  10.0  MG 1.8  --   --  1.8 1.6* 1.6* 1.8  --   PHOS 6.6*  --   --  5.5* 4.4 4.1  --  3.7   < > = values in this interval not displayed.   Estimated Creatinine Clearance: 30.2 mL/min (A) (by C-G formula based on SCr of 1.61 mg/dL (H)). Liver & Pancreas: Recent Labs  Lab 03/21/19 0630 03/22/19 0439 03/23/19 0542 03/24/19 0552 03/25/19 0602  ALBUMIN 3.1* 3.2* 3.4* 3.3* 3.5   No results for input(s): LIPASE, AMYLASE in the last 168 hours. No results for input(s): AMMONIA in the last 168 hours. Diabetic: No results for input(s): HGBA1C in the last 72 hours. Recent Labs  Lab 03/24/19 2141 03/25/19 0626 03/25/19 0655 03/25/19 1140 03/25/19 1239  GLUCAP 127* 62* 158* 66* 145*   Cardiac Enzymes: No results for input(s): CKTOTAL, CKMB, CKMBINDEX, TROPONINI in the last 168 hours. Recent Labs    09/12/18 1201  PROBNP 51.0   Coagulation Profile: No results for input(s): INR, PROTIME in the last 168 hours. Thyroid Function Tests: No results for input(s): TSH, T4TOTAL, FREET4, T3FREE, THYROIDAB in the last 72 hours. Lipid Profile: No results for input(s): CHOL, HDL, LDLCALC, TRIG, CHOLHDL, LDLDIRECT in the last 72 hours. Anemia Panel: No results for input(s): VITAMINB12, FOLATE, FERRITIN, TIBC, IRON, RETICCTPCT in the last 72 hours. Urine analysis:    Component Value Date/Time   COLORURINE YELLOW 03/20/2019 2030   [place] 03/20/2019 2030   LABSPEC 1.010 03/20/2019 2030   [place] 5.0 03/20/2019 2030   GLUCOSEU NEGATIVE 03/20/2019 2030   [place] NEGATIVE 03/20/2019 2030   [place] NEGATIVE 03/20/2019 2030   [place] 03/20/2019 2030   PROTEINUR 30 (A) 03/20/2019 2030   NITRITE NEGATIVE 03/20/2019 2030   LEUKOCYTESUR SMALL (A) 03/20/2019 2030   Sepsis Labs: Invalid input(s): PROCALCITONIN, [place]  Microbiology: Recent Results (from the past 240 hour(s))  SARS CORONAVIRUS 2 (TAT 6-24 HRS) Nasopharyngeal  Nasopharyngeal Swab     Status: None   Collection Time: 03/18/19 12:10 AM   Specimen: Nasopharyngeal Swab  Result Value Ref Range Status   SARS Coronavirus 2 NEGATIVE NEGATIVE Final    Comment: (NOTE) SARS-CoV-2 target nucleic acids are NOT DETECTED. The SARS-CoV-2 RNA is generally detectable in upper and lower respiratory  specimens during the acute phase of infection. Negative results do not preclude SARS-CoV-2 infection, do not rule out co-infections with other pathogens, and should not be used as the sole basis for treatment or other patient management decisions. Negative results must be combined with clinical observations, patient history, and epidemiological information. The expected result is Negative. Fact Sheet for Patients: SugarRoll.be Fact Sheet for Healthcare Providers: https://www.woods-mathews.com/ This test is not yet approved or cleared by the [place] FDA and  has been authorized for detection and/or diagnosis of SARS-CoV-2 by FDA under an Emergency Use Authorization (EUA). This EUA will remain  in effect (meaning this test can be used) for the duration of the COVID-19 declaration under Section 56 4(b)(1) of the Act, 21 U.S.C. section 360bbb-3(b)(1), unless the authorization is terminated or revoked sooner. Performed at [facility], [place] [address] [place], [place] [address]     Radiology Studies: CARDIAC CATHETERIZATION  Result Date: 03/25/2019 1. Mildly elevated PCWP. 2. Primarily pulmonary venous hypertension with PVR 3.27 mmHg. 3. Mitral stenosis appears moderate on this study with the caveat that HR was in the 100s. Patient has moderate pulmonary hypertension that appears to be predominantly pulmonary venous hypertension.  I am concerned that her main problem could be mitral stenosis.  By cath today, it is moderate, though high rate in atrial fibrillation may worsen (HR 100s).    [name] Allan, MD [facility]  If 7PM-7AM, please contact night-coverage [email or web address] Password [facility] 03/25/2019, 4:24 PM

## 2019-03-25 NOTE — Progress Notes (Signed)
Inpatient Diabetes Program Recommendations  AACE/ADA: New Consensus Statement on Inpatient Glycemic Control   Target Ranges:  Prepandial:   less than 140 mg/dL      Peak postprandial:   less than 180 mg/dL (1-2 hours)      Critically ill patients:  140 - 180 mg/dL   Results for Ellen Jordan, Ellen Jordan (MRN [id number]) as of 03/25/2019 09:34  Ref. Range 03/24/2019 06:20 03/24/2019 11:34 03/24/2019 16:50 03/24/2019 21:41 03/25/2019 06:26 03/25/2019 06:55  Glucose-Capillary Latest Ref Range: 70 - 99 mg/dL 84 184 (H) 153 (H) 127 (H) 62 (L) 158 (H)   Review of Glycemic Control  Diabetes history: DM2 Outpatient Diabetes medications: Levemir 50 units QHS, Novolog 10-16 units TID with meals, Victoza 1.8 mg daily Current orders for Inpatient glycemic control: Levemir 30 units BID, Novolog 0-9 units TID with meals, Novolog 0-5 units QHS  Inpatient Diabetes Program Recommendations:   Insulin - Basal: Fasting glucose 62 mg/dl today. May want to consider changing Levemir to Levemir 30 units QAM and Levemir 28 units QHS.  Thanks, Barnie Alderman, RN, MSN, CDE Diabetes Coordinator Inpatient Diabetes Program [phone] (Team Pager from 8am to 5pm)

## 2019-03-26 LAB — GLUCOSE, CAPILLARY
Glucose-Capillary: 75 mg/dL (ref 70–99)
Glucose-Capillary: 98 mg/dL (ref 70–99)
Glucose-Capillary: 123 mg/dL — ABNORMAL HIGH (ref 70–99)
Glucose-Capillary: 220 mg/dL — ABNORMAL HIGH (ref 70–99)
Glucose-Capillary: 200 mg/dL — ABNORMAL HIGH (ref 70–99)

## 2019-03-26 LAB — RENAL FUNCTION PANEL
Albumin: 3.4 g/dL — ABNORMAL LOW (ref 3.5–5.0)
Anion gap: 12 (ref 5–15)
BUN: 26 mg/dL — ABNORMAL HIGH (ref 8–23)
CO2: 30 mmol/L (ref 22–32)
Calcium: 9.5 mg/dL (ref 8.9–10.3)
Chloride: 93 mmol/L — ABNORMAL LOW (ref 98–111)
Creatinine, Ser: 1.59 mg/dL — ABNORMAL HIGH (ref 0.44–1.00)
GFR calc Af Amer: 36 mL/min — ABNORMAL LOW (ref >60)
GFR calc non Af Amer: 31 mL/min — ABNORMAL LOW (ref >60)
Glucose, Bld: 141 mg/dL — ABNORMAL HIGH (ref 70–99)
Phosphorus: 3.9 mg/dL (ref 2.5–4.6)
Potassium: 3.6 mmol/L (ref 3.5–5.1)
Sodium: 135 mmol/L (ref 135–145)

## 2019-03-26 LAB — POCT I-STAT EG7
Acid-Base Excess: 10 mmol/L — ABNORMAL HIGH (ref 0.0–2.0)
Acid-Base Excess: 10 mmol/L — ABNORMAL HIGH (ref 0.0–2.0)
Bicarbonate: 35.6 mmol/L — ABNORMAL HIGH (ref 20.0–28.0)
Bicarbonate: 35.7 mmol/L — ABNORMAL HIGH (ref 20.0–28.0)
Calcium, Ion: 1.26 mmol/L (ref 1.15–1.40)
Calcium, Ion: 1.28 mmol/L (ref 1.15–1.40)
HCT: 32 % — ABNORMAL LOW (ref 36.0–46.0)
HCT: 32 % — ABNORMAL LOW (ref 36.0–46.0)
Hemoglobin: 10.9 g/dL — ABNORMAL LOW (ref 12.0–15.0)
Hemoglobin: 10.9 g/dL — ABNORMAL LOW (ref 12.0–15.0)
O2 Saturation: 64 %
O2 Saturation: 64 %
Potassium: 3.7 mmol/L (ref 3.5–5.1)
Potassium: 3.8 mmol/L (ref 3.5–5.1)
Sodium: 139 mmol/L (ref 135–145)
Sodium: 139 mmol/L (ref 135–145)
TCO2: 37 mmol/L — ABNORMAL HIGH (ref 22–32)
TCO2: 37 mmol/L — ABNORMAL HIGH (ref 22–32)
pCO2, Ven: 51.4 mmHg (ref 44.0–60.0)
pCO2, Ven: 52.2 mmHg (ref 44.0–60.0)
pH, Ven: 7.441 — ABNORMAL HIGH (ref 7.250–7.430)
pH, Ven: 7.449 — ABNORMAL HIGH (ref 7.250–7.430)
pO2, Ven: 32 mmHg (ref 32.0–45.0)
pO2, Ven: 33 mmHg (ref 32.0–45.0)

## 2019-03-26 LAB — COOXEMETRY PANEL
Carboxyhemoglobin: 2 % — ABNORMAL HIGH (ref 0.5–1.5)
Methemoglobin: 1 % (ref 0.0–1.5)
O2 Saturation: 66.5 %
Total hemoglobin: 13.9 g/dL (ref 12.0–16.0)

## 2019-03-26 LAB — CBC
HCT: 32 % — ABNORMAL LOW (ref 36.0–46.0)
Hemoglobin: 10.2 g/dL — ABNORMAL LOW (ref 12.0–15.0)
MCH: 30.6 pg (ref 26.0–34.0)
MCHC: 31.9 g/dL (ref 30.0–36.0)
MCV: 96.1 fL (ref 80.0–100.0)
Platelets: 169 10*3/uL (ref 150–400)
RBC: 3.33 MIL/uL — ABNORMAL LOW (ref 3.87–5.11)
RDW: 15.9 % — ABNORMAL HIGH (ref 11.5–15.5)
WBC: 6.5 10*3/uL (ref 4.0–10.5)
nRBC: 0 % (ref 0.0–0.2)

## 2019-03-26 MED ORDER — INSULIN DETEMIR 100 UNIT/ML ~~LOC~~ SOLN
20.0000 [IU] | Freq: Two times a day (BID) | SUBCUTANEOUS | Status: DC
  Administered 2019-03-26 – 2019-03-28 (×3): 20 [IU] via SUBCUTANEOUS
  Filled 2019-03-26 (×6): qty 0.2

## 2019-03-26 MED ORDER — SODIUM CHLORIDE 0.9 % IV SOLN: INTRAVENOUS | Status: DC

## 2019-03-26 MED ORDER — ENSURE ENLIVE PO LIQD
237.0000 mL | Freq: Two times a day (BID) | ORAL | Status: DC
  Administered 2019-03-26 – 2019-03-28 (×4): 237 mL via ORAL

## 2019-03-26 MED ORDER — ADULT MULTIVITAMIN W/MINERALS CH
1.0000 | ORAL_TABLET | Freq: Every day | ORAL | Status: DC
  Administered 2019-03-26 – 2019-03-28 (×3): 1 via ORAL
  Filled 2019-03-26 (×3): qty 1

## 2019-03-26 MED ORDER — TORSEMIDE 20 MG PO TABS
60.0000 mg | ORAL_TABLET | Freq: Two times a day (BID) | ORAL | Status: DC
  Administered 2019-03-26 – 2019-03-27 (×2): 60 mg via ORAL
  Filled 2019-03-26 (×2): qty 3

## 2019-03-26 NOTE — Consult Note (Signed)
[facility] CM Inpatient Consult   03/26/2019  Ellen Jordan 1945-10-17 [id number]  Patient screened for high risk score and o check if potential [name] Management service needs. Patient in the [facility] Medicare plan with [facility] Care Management Accountable Care Organization [ACO] benefits.   Review of patient's medical record with particularly the palliative care consult reveals patient is for home with hospice care.   Primary Care Provider is Dr. Wende Neighbors which provides the transition of care follow up.  Plan:  Given the choice of hospice care, the  patient will have full care management services through Hospice and there were no [facility] follow up needs for care management program.  Will sign off at transition to assure transitional disposition is complete.   For questions, please contact:  Natividad Brood, RN BSN [facility] Liaison  [phone] business mobile phone Toll free office [phone]  Fax number: [phone] [name]

## 2019-03-26 NOTE — Progress Notes (Signed)
Initial Nutrition Assessment  DOCUMENTATION CODES:   Obesity unspecified  INTERVENTION:   -D/c calorie count -Ensure Enlive po BID, each supplement provides 350 kcal and 20 grams of protein -MVI with minerals daily  NUTRITION DIAGNOSIS:   Inadequate oral intake related to decreased appetite as evidenced by meal completion < 50%, per patient/family report.  GOAL:   Patient will meet greater than or equal to 90% of their needs  MONITOR:   PO intake, Supplement acceptance, Labs, Weight trends, Skin  REASON FOR ASSESSMENT:   Consult Calorie Count  ASSESSMENT:   Ellen Jordan  is a 74 y.o. female, with history of chronic pain atrial fibrillation, on chronic anticoagulation with Eliquis, diabetes mellitus type 2, anxiety, hypertension, pulmonary hypertension, came to hospital with worsening shortness of breath.  Patient says that shortness of breath got worse over the past few days.  She also complains of rectal bleeding tonight that occurs occasionally when she has hard bowel movement.  Denies chest pain, abdominal pain or dysuria.  She is followed by pulmonology for COPD and medication induced interstitial lung disease.  Pt admitted with AKI on CKD stage III.   3/9- transferred from [facility] 3/10- PICC placed 3/15- RHC/LHC reveals primarily pulmonary venous hypertension due to significant mitral stenosis  Reviewed I/O's: +660 ml x 24 hours and -9.7 L since admission  UOP: 1.2 L x 24 hours  Per cardiology notes, plan for TEE/DCCV tomorrow (03/27/19).   Attempted to speak with pt via phone, however, no answer.   Reviewed wt hx; wt has been stable over the past year. However, suspect edema may be masking true weight loss.   Pt with variable intake and MD requesting calorie count. Documented meal completion 25-90%, which has improved over the past 48 hours.   3/14-3/15/21 Breakfast: 61 kcals, 1 grams protein Lunch: 140 kcals, 5 grams protein Dinner: 295 kcals, 12 grams  protein  Total intake: 496 kcal (30% of minimum estimated needs)  18 grams protein (23% of minimum estimated needs)  3/15-3/16/21 Breakfast: 266 kcals, 16 grams protein Lunch: 381 kcals, 14 grams protein Dinner: 376 kcals, 16 grams protein  Total intake: 1023 kcal (62% of minimum estimated needs)  46 grams protein (58% of minimum estimated needs)  AverageTotal intake: 760 kcal (46% of minimum estimated needs)  32 grams protein (40% of minimum estimated needs)  Pt with poor oral intake and would benefit from nutrient dense supplement. One Ensure Enlive supplement provides 350 kcals, 20 grams protein, and 44-45 grams of carbohydrate vs one Glucerna shake supplement, which provides 220 kcals, 10 grams of protein, and 26 grams of carbohydrate. Given pt's hx of DM, RD will reassess adequacy of PO intake, CBGS, and adjust supplement regimen as appropriate at follow-up.   Palliative care following for goals of care. Pt desires no invasive procedures and requesting to go home with hospice services.   Labs reviewed: CBGS: 75-109.   Diet Order:   Diet Order            Diet NPO time specified  Diet effective midnight        Diet heart healthy/carb modified Room service appropriate? Yes; Fluid consistency: Thin  Diet effective now              EDUCATION NEEDS:   No education needs have been identified at this time  Skin:  Skin Assessment: Reviewed RN Assessment  Last BM:  03/24/19  Height:   Ht Readings from Last 1 Encounters:  03/19/19 _0  (1.6  m)    Weight:   Wt Readings from Last 1 Encounters:  03/26/19 80.9 kg    Ideal Body Weight:  52.3 kg  BMI:  Body mass index is 31.58 kg/m.  Estimated Nutritional Needs:   Kcal:  1650-1850  Protein:  80-95 grams  Fluid:  1.6-1.8 L    Loistine Chance, RD, LDN, [place] Registered Dietitian II Certified Diabetes Care and Education Specialist Please refer to [facility] for RD and/or RD on-call/weekend/after hours pager

## 2019-03-26 NOTE — H&P (View-Only) (Signed)
Patient ID: Ellen Jordan, female   DOB: 1945/04/05, 74 y.o.   MRN: [id number]     Advanced Heart Failure Rounding Note  PCP-Cardiologist: Rozann Lesches, MD   Subjective:    Remains in atrial fib/flutter w/ RVR, 110s-130s but asymptomatic at rest.   Currently off [place]. O2 sats 95% on RA.   Co-ox stable w/ dobutamine wean, 67% on 2.5 mcg/kg/min.   Volume overall markedly improved but still volume up. CVP 11. SCr continues to trend down, 1.61>>1.59.    Switched back to PO diuretics yesterday, torsemide 60 mg. Only 450 cc in UOP yesterday. Wt up 2 lb.   Overall feels much better. OOB and sitting in chair.   Cardiac Procedures  RHC Procedural Findings (on dobutamine 5 mcg/kg/min): Hemodynamics (mmHg) RA mean 9 RV 48/4 PA 54/24, mean 34 PCWP mean 18 LV 101/7 AO 95/51 Oxygen saturations: PA 64% AO 100% Cardiac Output (Fick) 4.89  Cardiac Index (Fick) 2.59 PVR 3.27 WU Mitral valve mean gradient 11.5 mmHg Mitral valve area 1.67 cm^2   Objective:   Weight Range: 80.9 kg Body mass index is 31.58 kg/m.   Vital Signs:   Temp:  [97.5 F (36.4 C)-98.4 F (36.9 C)] 97.8 F (36.6 C) (03/16 0700) Pulse Rate:  [78-143] 87 (03/16 0600) Resp:  [16-22] 17 (03/16 0700) BP: (129-159)/(57-100) 129/69 (03/16 0700) SpO2:  [90 %-98 %] 93 % (03/16 0600) Weight:  [80.9 kg] 80.9 kg (03/16 0040) Last BM Date: 03/24/19  Weight change: Filed Weights   03/24/19 0100 03/25/19 0618 03/26/19 0040  Weight: 86.1 kg 80 kg 80.9 kg    Intake/Output:   Intake/Output Summary (Last 24 hours) at 03/26/2019 0906 Last data filed at 03/26/2019 0900 Gross per 24 hour  Intake 1230 ml  Output 450 ml  Net 780 ml      Physical Exam   PHYSICAL EXAM: CVP 11-12 General:  Well appearing WF sitting up in chair. No respiratory difficulty HEENT: normal Neck: supple. Elevated JVD, just below jaw line. Carotids 2+ bilat; no bruits. No lymphadenopathy or thyromegaly appreciated. Cor: PMI nondisplaced.  Irregularly irregular rhythm, tachy rate 1/6 SEM at RUSB Lungs: clear Abdomen: obese, soft, nontender, nondistended. No hepatosplenomegaly. No bruits or masses. Good bowel sounds. Extremities: no cyanosis, clubbing, rash, trace bilateral LEE up to knees, + bilateral unna boots Neuro: alert & oriented x 3, cranial nerves grossly intact. moves all 4 extremities w/o difficulty. Affect pleasant.    Telemetry   Atrial fibrillation 110s-130s (personally reviewed)  EKG   n/a   Labs    CBC Recent Labs    03/25/19 0601 03/26/19 0410  WBC 7.7 6.5  HGB 10.5* 10.2*  HCT 32.6* 32.0*  MCV 97.6 96.1  PLT 182 884   Basic Metabolic Panel Recent Labs    03/24/19 0552 03/24/19 0552 03/25/19 0601 03/25/19 0602 03/26/19 0410  NA 136   < >  --  136 135  K 3.9   < >  --  3.8 3.6  CL 94*   < >  --  92* 93*  CO2 31   < >  --  30 30  GLUCOSE 157*   < >  --  110* 141*  BUN 39*   < >  --  31* 26*  CREATININE 1.60*   < >  --  1.61* 1.59*  CALCIUM 10.1   < >  --  10.0 9.5  MG 1.6*  --  1.8  --   --   PHOS  4.1   < >  --  3.7 3.9   < > = values in this interval not displayed.   Liver Function Tests Recent Labs    03/25/19 0602 03/26/19 0410  ALBUMIN 3.5 3.4*   No results for input(s): LIPASE, AMYLASE in the last 72 hours. Cardiac Enzymes No results for input(s): CKTOTAL, CKMB, CKMBINDEX, TROPONINI in the last 72 hours.  BNP: BNP (last 3 results) Recent Labs    03/17/19 2213  BNP 338.0*    ProBNP (last 3 results) Recent Labs    09/12/18 1201  PROBNP 51.0     D-Dimer No results for input(s): DDIMER in the last 72 hours. Hemoglobin A1C No results for input(s): HGBA1C in the last 72 hours. Fasting Lipid Panel No results for input(s): CHOL, HDL, LDLCALC, TRIG, CHOLHDL, LDLDIRECT in the last 72 hours. Thyroid Function Tests No results for input(s): TSH, T4TOTAL, T3FREE, THYROIDAB in the last 72 hours.  Invalid input(s): FREET3  Other results:   Imaging    No  results found.   Medications:     Scheduled Medications: . acetaminophen  1,000 mg Oral Q8H  . apixaban  5 mg Oral BID  . atorvastatin  40 mg Oral Daily  . Chlorhexidine Gluconate Cloth  6 each Topical Daily  . darbepoetin (ARANESP) injection - NON-DIALYSIS  60 mcg Subcutaneous Q Wed-1800  . fluticasone  2 spray Each Nare Daily  . fluticasone furoate-vilanterol  1 puff Inhalation Daily  . insulin aspart  0-5 Units Subcutaneous QHS  . insulin aspart  0-9 Units Subcutaneous TID WC  . insulin detemir  30 Units Subcutaneous BID  . loratadine  10 mg Oral Daily  . mouth rinse  15 mL Mouth Rinse BID  . pantoprazole  40 mg Oral Daily  . ramelteon  8 mg Oral QHS  . sertraline  100 mg Oral Daily  . sodium chloride flush  10-40 mL Intracatheter Q12H  . sodium chloride flush  3 mL Intravenous Q12H  . sodium chloride flush  3 mL Intravenous Q12H  . sodium chloride flush  3 mL Intravenous Q12H  . torsemide  60 mg Oral Daily    Infusions: . sodium chloride    . sodium chloride 250 mL (03/24/19 0700)  . sodium chloride    . amiodarone 30 mg/hr (03/25/19 2356)  . DOBUTamine 2.5 mcg/kg/min (03/25/19 1019)    PRN Medications: sodium chloride, sodium chloride, sodium chloride, acetaminophen, albuterol, ALPRAZolam, camphor-menthol, ipratropium-albuterol, liver oil-zinc oxide, loperamide, ondansetron (ZOFRAN) IV, sodium chloride flush, sodium chloride flush, sodium chloride flush   Assessment/Plan   1. RV failure: Echo this admission shows LV EF 65-70% with RV moderate-severely dilated and moderate dysfunction, D-shaped septum, the mitral valve is severely calcified though mean gradient is only about 5 mmHg suggesting mild to moderate mitral stenosis (pressure half-time does not appear short), mild AS.  The patient was admitted markedly volume overloaded.  I am concerned that she has end-stage RV dysfunction.  She also has mitral stenosis which visually looks significant but measures only  mild-moderate by mean gradient and pressure half-time on echo.  She has been oliguric with creatinine up to 4.27 => down to 1.59 today on dobutamine.  Given her markedly low GFR and soft BP at times,  She was started on dobutamine. RHC/LHC this admit showed mildly elevated PCWP with moderate primarily pulmonary venous hypertension. Mitral stenosis appears moderate.  - Co-ox 67% today on 2.5 mcg/kg/min of dobutamine.  - Will stop dobutamine gtt today and  follow co-ox.  - Only 450 cc in UOP yesterday w/ PO torsemide. Wt up 2 lb.  - Increase torsemide to 60 mg bid  - Suspect prognosis overall will be poor with severe RV failure.  Will need to see if we can maintain improvement after weaning her off dobutamine.  2. Pulmonary hypertension: PASP estimated at 41 mmHg on echo this admission and 42 mmHg on echo in 2/20.  However, there has been suspicion for severe pulmonary hypertension based on dilated and dysfunctional RV.  There was concern for group 3 PH but recent high resolution chest CT in 2/21 does not show marked lung abnormality (there does not seem to be severe emphysema or interstitial lung disease). RHC/LHC 3/15 suggests primarily pulmonary venous hypertension due to significant mitral stenosis.  3. Mitral stenosis: The valve looks severely calcified and restricted, but hemodynamics by echo were not that impressive.  Mean gradient 5 mmHg and MVA 2.66 cm^2 by PHT suggested no more than mild mitral stenosis.  However, cath is suggestive of moderate mitral stenosis with mean gradient 11.5 mmHg and MVA 1.67 cm^2.   - She does not appear to be a valvuloplasty candidate due to her degree of calcification, and she would be a difficult surgical candidate (poor functional status).  - Will assess the mitral valve more closely later this week with TEE at time of DCCV.  4. Atrial flutter: The patient appears to have been in atrial flutter since at least 12/20.  Mild RVR.  - Off atenolol with AKI.  - Control HR  with amiodarone gtt  - Will stop dobutamine today.   - Continue apixaban  - Plan TEE-guided DCCV on Wednesday.   5. Acute on CKD stage 3: Creatinine peaked at 4.3. Todays creatinine is down to 1.59. Suspect cardiorenal syndrome in setting of RV failure.  She would be a poor candidate for long-term HD with RV failure.  - follow BMP w/ diuretics  6. Chronic hypoxemic respiratory failure:  She has a history of smoking, but high resolution CT lungs does not show clear evidence of severe COPD or of interstitial lung disease.   7. Hyponatremia: Sodium up with tolvaptan dose on 3/11.  Fluid restrict.    Continue to Mobilize with PT.   Length of Stay: [address] PA-C  03/26/2019, 9:06 AM  Advanced Heart Failure Team Pager [phone] (M-F; 7a - 4p)  Please contact [facility] for night-coverage after hours (4p -7a ) and weekends on [email or web address]  Patient seen with PA, agree with the above note.   Stable today, creatinine continues to trend down 1.59.  Volume status ok on RHC yesterday, looks ok on exam today.  - Stop dobutamine and repeat co-ox in am.  - Increase torsemide to 60 mg bid.   Continue amiodarone gtt and apixaban, plan for TEE-guided DCCV.  Will reassess mitral valve stenosis on TEE. Discussed risks/benefits with patient, she agrees to procedure.   [name] 03/26/2019 4:38 PM

## 2019-03-26 NOTE — TOC Initial Note (Signed)
Transition of Care [facility]) - Initial/Assessment Note    Patient Details  Name: Ellen Jordan MRN: [id number] Date of Birth: [birth year]  Transition of Care [facility]) CM/SW Contact:    Alberteen Sam, LCSW Phone Number: 03/26/2019, 3:43 PM  Clinical Narrative:                  CSW notes patient's plan for home with hospice services, CSW called patient's husband Hedy Camara he reports he would be interested in home hospice and has no preference of agency and has not spoken to hospice liaisons yet. CSW informed him referral would be sent out and he was agreeable to [facility] looking at referral, CSW spoke with Dorian Pod with [facility] she reports she will call Hedy Camara to identify home needs. CSW notes Hedy Camara reports no hospital bed wanted at home, he also notes patient has home oxygen and [facility] would follow up with that as well.   TOC team continues to follow for discharge planning needs.     Expected Discharge Plan: Home w Hospice Care Barriers to Discharge: Continued Medical Work up   Patient Goals and CMS Choice Patient states their goals for this hospitalization and ongoing recovery are:: to go home with hospice CMS Medicare.gov Compare Post Acute Care list provided to:: Patient Represenative (must comment)(spouse Hedy Camara) Choice offered to / list presented to : Spouse  Expected Discharge Plan and Services Expected Discharge Plan: [place] Acute Care Choice: [place] Living arrangements for the past 2 months: Single Family Home                                      Prior Living Arrangements/Services Living arrangements for the past 2 months: Single Family Home Lives with:: Spouse Patient language and need for interpreter reviewed:: Yes Do you feel safe going back to the place where you live?: Yes      Need for Family Participation in Patient Care: Yes (Comment) Care giver support system in place?: Yes (comment) Current home services:  DME Criminal Activity/Legal Involvement Pertinent to Current Situation/Hospitalization: No - Comment as needed  Activities of Daily Living Home Assistive Devices/Equipment: Shower chair with back ADL Screening (condition at time of admission) Patient's cognitive ability adequate to safely complete daily activities?: Yes Is the patient deaf or have difficulty hearing?: No Does the patient have difficulty seeing, even when wearing glasses/contacts?: No Does the patient have difficulty concentrating, remembering, or making decisions?: No Patient able to express need for assistance with ADLs?: Yes Does the patient have difficulty dressing or bathing?: Yes Independently performs ADLs?: Yes (appropriate for developmental age) Does the patient have difficulty walking or climbing stairs?: Yes Weakness of Legs: Both Weakness of Arms/Hands: None  Permission Sought/Granted Permission sought to share information with : Case Manager, Customer service manager, Family Supports Permission granted to share information with : Yes, Verbal Permission Granted  Share Information with NAME: Hedy Camara  Permission granted to share info w AGENCY: Hospice  Permission granted to share info w Relationship: spouse  Permission granted to share info w Contact Information: [phone]  Emotional Assessment Appearance:: Appears stated age     Orientation: : Oriented to Self, Oriented to  Time, Oriented to Place Alcohol / Substance Use: Not Applicable Psych Involvement: No (comment)  Admission diagnosis:  SOB (shortness of breath) [R06.02] Chronic atrial fibrillation (HCC) [I48.20] AKI (acute kidney injury) ([place]) [  N17.9] Patient Active Problem List   Diagnosis Date Noted  . Palliative care by specialist   . Goals of care, counseling/discussion   . Chronic atrial fibrillation ([place])   . Acute respiratory failure with hypoxemia ([place])   . RVF (right ventricular failure) ([place])   . AKI (acute kidney injury)  ([place]) 03/18/2019  . Uncontrolled type 2 diabetes mellitus with hyperglycemia ([place]) 02/19/2019  . Atrial fibrillation with RVR ([place]) 12/18/2018  . Hypokalemia 12/18/2018  . Normocytic anemia 12/18/2018  . Mediastinal lymphadenopathy 12/18/2018  . Pulmonary nodules 12/18/2018  . Pulmonary hypertension ([place]) 09/08/2018  . History of tobacco abuse 11/09/2016  . Controlled diabetes mellitus type 2 with complications ([place]) 01/41/5973  . Restless leg syndrome 11/04/2016  . PAF (paroxysmal atrial fibrillation) ([place]) 06/03/2016  . RBBB 06/03/2016  . SOB (shortness of breath)   . Atrial fibrillation with rapid ventricular response ([place]) 02/28/2015  . Uncontrolled type 2 diabetes mellitus without complication, with long-term current use of insulin 12/24/2014  . Mixed hyperlipidemia 12/24/2014  . Essential hypertension, benign 12/24/2014   PCP:  [name], MD Pharmacy:   [facility] [address], [place] [address] Phone: [phone] Fax: [facility] [address] [place] [address] Phone: [phone] Fax: [phone]  [facility] [place] [place] [address] Phone: [phone] Fax: [phone]     Social Determinants of Health (SDOH) Interventions    Readmission Risk Interventions Readmission Risk Prevention Plan 03/19/2019  Transportation Screening Complete  PCP or Specialist Appt within 3-5 Days Not Complete  HRI or Home Care Consult Complete  Social Work Consult for [place] Planning/Counseling [place] Not Complete  Medication Review Press photographer) Complete  Some recent data might be hidden

## 2019-03-26 NOTE — Progress Notes (Signed)
PROGRESS NOTE  GENEVER HENTGES OVF:[id number] DOB: 12/16/1945   PCP: [name], MD  Patient is from: Home.  Lives with her husband.  Recently started using walker due to recurrent fall and weakness.  DOA: 03/17/2019 LOS: 8  Brief Narrative / Interim history: Patient is a 74 year old female with past medical history significant for COPD on 2 L, ILD, A. fib on Eliquis, diastolic CHF, Pulm HTN, DM-2, CKD-3B, HTN and obesity presented to [facility] with progressive shortness of breath and rectal bleed and admitted for acute on chronic renal failure and acute on chronic diastolic CHF.   At [facility] ED, Cr 2.99 (baseline about 1.4).  CXR with mild pulmonary vascular congestion.  Cardiology consulted there but not able to diurese patient due to worsening renal function.  She was transferred to [facility] for nephrology input.  Nephrology, advanced heart failure team, PCCM and palliative care consulted.  Started on high-dose IV Lasix with dobutamine.  03/25/2019: Patient seen alongside patient's nurse.  Patient underwent right heart catheterization (see documentation by cardiology team)-pulmonary venous hypertension thought to be secondary to mitral stenosis.  Symptoms continue to improve.  Serum creatinine has remained stable at 1.61.  03/26/2019: Cardiology input is appreciated.  Right heart catheter reveals moderate mitral valve stenosis.  Patient is not deemed a good candidate for surgery or valvuloplasty.  Remains in A. fib with RVR.  Plan in 2 days with possible cardioversion.  Dobutamine is being weaned off.  CHF management as per cardiology team.  Renal function has remained stable.  Poor dialysis candidate.  Subjective: No new complaints. No worsening of shortness of breath No chest pain  Objective: Vitals:   03/26/19 0520 03/26/19 0600 03/26/19 0700 03/26/19 1232  BP:   129/69 (!) 148/81  Pulse: (!) 104 87  100  Resp: (!) _0 Temp:   97.8 F (36.6 C) (!) 97.5 F  (36.4 C)  TempSrc:   Oral Oral  SpO2: 92% 93%  95%  Weight:      Height:        Intake/Output Summary (Last 24 hours) at 03/26/2019 1644 Last data filed at 03/26/2019 1349 Gross per 24 hour  Intake 1290 ml  Output 1650 ml  Net -360 ml   Filed Weights   03/24/19 0100 03/25/19 0618 03/26/19 0040  Weight: 86.1 kg 80 kg 80.9 kg    Examination:  GENERAL: No acute distress.  Appears well.  HEENT: Mild pallor.  No jaundice.    NECK: Supple.  No apparent JVD.  RESP: Clear to auscultation.   CVS: S1-S2.   ABD/GI/GU: Bowel sounds present. Soft. Non tender.  EXT:  Unna boots over mild bilateral lower extremity edema. NEURO: Patient is awake and alert.  Patient moves all extremities.  Procedures:  None  Assessment & Plan: Acute on chronic diastolic CHF/RV failure/Pulm HTN:  -Echo revealed EF of 60 to 65%, indeterminate diastolic function and interventricular septum flattening in systole and diastole consistent with right ventricular pressure and volume overload.  -Patient underwent right heart catheterization today, 03/25/2019. -Responding to dobutamine and IV Lasix.   -Advanced HF team managing-on dobutamine and diuretics. -Renal function has improved. -Continue to monitor fluid status, renal function and electrolytes. 03/26/2019: Dobutamine is being weaned off.  Diuretics introduced.  Continue to monitor volume status.  Cardiology team is managing.  Mitral valve stenosis: -Patient underwent right cardiac catheterization today.   -Per cardiology-not a good candidate for valve replacement or valvuloplasty.  Atrial  flutter with wide-QRS and QTc: -Continue to optimize heart rate.  -Started on amiodarone drip while on inotropes. -On Eliquis for anticoagulation. -Optimize electrolytes -Possible DCCV down the road when off dobutamine. 03/26/2019: Possible TEE and DCCV in 2 days.  AKI on CKD-3B/azotemia:  -Cr 1.4 (on 02/14/2019)> 2.99 (admit)>> 4.37> 4.27> 3.79> 2.55> 1.88.  BUN 44>>  66> 59> 49.  -Likely cardiorenal.   -AKI has improved significantly.  Serum creatinine is down to 1.61. 03/26/2019: Serum creatinine has remained stable at 1.59.  Chronic COPD/respiratory failure on 2 L by nasal cannula at baseline: -Continue Breo Ellipta, duo nebs and supplemental oxygen  Controlled DM-2 with hyperglycemia: -A1c 6.7%.  On Levemir 15 units nightly and NovoLog 10 to 16 units 3 times daily at home. Recent Labs    03/26/19 0615 03/26/19 0640 03/26/19 1202  GLUCAP 75 98 123*  -Continue Levemir to 30 units twice daily, SSI-thin and a statin  Normocytic anemia: Baseline Hgb 9-10> 9.4 (admit)> 9.5> 8.9> 8.7> 9.1.  Iron sat 10%.  TIBC 473.  Mention of rectal bleed on presentation.  Husband thinks she might have wiped herself hard.  She is on Eliquis. -Continue monitoring -IV iron and ESA per nephrology.  Hypervolemic hyponatremia-likely due to CHF or renal failure.  Resolved. -Received tolvaptan once on 3/11. 03/26/2019: Resolved significantly.  Anxiety and depression/insomnia: Stable -Continue Zoloft and Xanax. -Add ramelteon at night.  Allergic rhinitis -Flonase and loratadine  Hyperlipidemia -Fasting lipid panel in the morning -continue statins  GERD -continue protonix  Class II obesity: Body mass index is 31.58 kg/m.  Weight improved after diuretics. -Encourage lifestyle change to lose weight -May benefit from newer agents with cardiovascular benefits  Pulmonary nodules: Noted on high-resolution CT on 02/19/2019 -Follow-up CT in 12 months as recommended.  Liver cirrhosis?  Noted on high-resolution CT last months.  Debility/Generalized weakness: recently started using walker recently -PT/OT-recommended SNF but she wished to go home with home hospice when ready  Goal of care: DNR/DNI appropriately.  Multiple comorbidities as above.  Not a candidate for dialysis. -Palliative care consulted.    DVT prophylaxis: On Eliquis for A. fib Code Status:  DNR/DNI Family Communication: Patient and RN.  No family member at bedside today.  Discharge barrier: Evaluation and treatment of acute renal failure, valvular heart disease, atrial fibrillation with RVR and decompensated CHF.  On inotropes and IV diuretics Patient is from: Home Final disposition: Likely home with home hospice when medically stable and cleared by cardiology.  Consultants:  nephrology (off), PCCM (off), advanced HF team and palliative care   Microbiology summarized: COVID-19 screen negative.  Sch Meds:  Scheduled Meds: . acetaminophen  1,000 mg Oral Q8H  . apixaban  5 mg Oral BID  . atorvastatin  40 mg Oral Daily  . Chlorhexidine Gluconate Cloth  6 each Topical Daily  . darbepoetin (ARANESP) injection - NON-DIALYSIS  60 mcg Subcutaneous Q Wed-1800  . feeding supplement (ENSURE ENLIVE)  237 mL Oral BID BM  . fluticasone  2 spray Each Nare Daily  . fluticasone furoate-vilanterol  1 puff Inhalation Daily  . insulin aspart  0-9 Units Subcutaneous TID WC  . insulin detemir  20 Units Subcutaneous BID  . loratadine  10 mg Oral Daily  . mouth rinse  15 mL Mouth Rinse BID  . multivitamin with minerals  1 tablet Oral Daily  . pantoprazole  40 mg Oral Daily  . ramelteon  8 mg Oral QHS  . sertraline  100 mg Oral Daily  .  sodium chloride flush  10-40 mL Intracatheter Q12H  . sodium chloride flush  3 mL Intravenous Q12H  . sodium chloride flush  3 mL Intravenous Q12H  . sodium chloride flush  3 mL Intravenous Q12H  . torsemide  60 mg Oral BID   Continuous Infusions: . sodium chloride    . sodium chloride 250 mL (03/24/19 0700)  . sodium chloride    . amiodarone 30 mg/hr (03/26/19 1417)   PRN Meds:.sodium chloride, sodium chloride, sodium chloride, acetaminophen, albuterol, ALPRAZolam, camphor-menthol, ipratropium-albuterol, liver oil-zinc oxide, loperamide, ondansetron (ZOFRAN) IV, sodium chloride flush, sodium chloride flush, sodium chloride  flush  Antimicrobials: Anti-infectives (From admission, onward)   None       I have personally reviewed the following labs and images: CBC: Recent Labs  Lab 03/22/19 0439 03/22/19 0439 03/23/19 0542 03/23/19 0542 03/24/19 0552 03/25/19 0601 03/25/19 0737 03/25/19 0741 03/26/19 0410  WBC 5.4  --  5.4  --  5.6 7.7  --   --  6.5  HGB 8.7*   < > 9.1*   < > 9.4* 10.5* 10.9* 10.9* 10.2*  HCT 27.0*   < > 27.6*   < > 29.9* 32.6* 32.0* 32.0* 32.0*  MCV 95.1  --  94.2  --  96.5 97.6  --   --  96.1  PLT 153  --  163  --  165 182  --   --  169   < > = values in this interval not displayed.   BMP &GFR Recent Labs  Lab 03/21/19 0630 03/21/19 1722 03/22/19 0439 03/22/19 0439 03/23/19 0542 03/23/19 0542 03/24/19 0552 03/25/19 0601 03/25/19 0602 03/25/19 0737 03/25/19 0741 03/26/19 0410  NA 126*   < > 130*   < > 137   < > 136  --  136 139 139 135  K 4.6  --  4.0   < > 3.4*   < > 3.9  --  3.8 3.8 3.7 3.6  CL 93*  --  93*  --  96*  --  94*  --  92*  --   --  93*  CO2 20*  --  23  --  29  --  31  --  30  --   --  30  GLUCOSE 180*  --  134*  --  79  --  157*  --  110*  --   --  141*  BUN 66*  --  59*  --  49*  --  39*  --  31*  --   --  26*  CREATININE 3.79*  --  2.55*  --  1.88*  --  1.60*  --  1.61*  --   --  1.59*  CALCIUM 8.9  --  9.7  --  10.1  --  10.1  --  10.0  --   --  9.5  MG 1.8  --  1.8  --  1.6*  --  1.6* 1.8  --   --   --   --   PHOS 6.6*  --  5.5*  --  4.4  --  4.1  --  3.7  --   --  3.9   < > = values in this interval not displayed.   Estimated Creatinine Clearance: 30.8 mL/min (A) (by C-G formula based on SCr of 1.59 mg/dL (H)). Liver & Pancreas: Recent Labs  Lab 03/22/19 0439 03/23/19 0542 03/24/19 0552 03/25/19 0602 03/26/19 0410  ALBUMIN 3.2* 3.4* 3.3* 3.5 3.4*  No results for input(s): LIPASE, AMYLASE in the last 168 hours. No results for input(s): AMMONIA in the last 168 hours. Diabetic: No results for input(s): HGBA1C in the last 72  hours. Recent Labs  Lab 03/25/19 1628 03/25/19 2100 03/26/19 0615 03/26/19 0640 03/26/19 1202  GLUCAP 163* 109* 75 98 123*   Cardiac Enzymes: No results for input(s): CKTOTAL, CKMB, CKMBINDEX, TROPONINI in the last 168 hours. Recent Labs    09/12/18 1201  PROBNP 51.0   Coagulation Profile: No results for input(s): INR, PROTIME in the last 168 hours. Thyroid Function Tests: No results for input(s): TSH, T4TOTAL, FREET4, T3FREE, THYROIDAB in the last 72 hours. Lipid Profile: No results for input(s): CHOL, HDL, LDLCALC, TRIG, CHOLHDL, LDLDIRECT in the last 72 hours. Anemia Panel: No results for input(s): VITAMINB12, FOLATE, FERRITIN, TIBC, IRON, RETICCTPCT in the last 72 hours. Urine analysis:    Component Value Date/Time   COLORURINE YELLOW 03/20/2019 2030   [place] 03/20/2019 2030   LABSPEC 1.010 03/20/2019 2030   [place] 5.0 03/20/2019 2030   GLUCOSEU NEGATIVE 03/20/2019 2030   [place] NEGATIVE 03/20/2019 2030   [place] NEGATIVE 03/20/2019 2030   [place] 03/20/2019 2030   PROTEINUR 30 (A) 03/20/2019 2030   NITRITE NEGATIVE 03/20/2019 2030   LEUKOCYTESUR SMALL (A) 03/20/2019 2030   Sepsis Labs: Invalid input(s): PROCALCITONIN, [place]  Microbiology: Recent Results (from the past 240 hour(s))  SARS CORONAVIRUS 2 (TAT 6-24 HRS) Nasopharyngeal Nasopharyngeal Swab     Status: None   Collection Time: 03/18/19 12:10 AM   Specimen: Nasopharyngeal Swab  Result Value Ref Range Status   SARS Coronavirus 2 NEGATIVE NEGATIVE Final    Comment: (NOTE) SARS-CoV-2 target nucleic acids are NOT DETECTED. The SARS-CoV-2 RNA is generally detectable in upper and lower respiratory specimens during the acute phase of infection. Negative results do not preclude SARS-CoV-2 infection, do not rule out co-infections with other pathogens, and should not be used as the sole basis for treatment or other patient management decisions. Negative results must be  combined with clinical observations, patient history, and epidemiological information. The expected result is Negative. Fact Sheet for Patients: SugarRoll.be Fact Sheet for Healthcare Providers: https://www.woods-mathews.com/ This test is not yet approved or cleared by the [place] FDA and  has been authorized for detection and/or diagnosis of SARS-CoV-2 by FDA under an Emergency Use Authorization (EUA). This EUA will remain  in effect (meaning this test can be used) for the duration of the COVID-19 declaration under Section 56 4(b)(1) of the Act, 21 U.S.C. section 360bbb-3(b)(1), unless the authorization is terminated or revoked sooner. Performed at [facility], [place] [address], [place], [place] [address]     Radiology Studies: No results found.  [name] Allan, MD [facility]  If 7PM-7AM, please contact night-coverage [email or web address] Password [facility] 03/26/2019, 4:44 PM

## 2019-03-26 NOTE — Progress Notes (Addendum)
Patient ID: Ellen Jordan, female   DOB: 07/03/1945, 74 y.o.   MRN: [id number]     Advanced Heart Failure Rounding Note  PCP-Cardiologist: Rozann Lesches, MD   Subjective:    Remains in atrial fib/flutter w/ RVR, 110s-130s but asymptomatic at rest.   Currently off [place]. O2 sats 95% on RA.   Co-ox stable w/ dobutamine wean, 67% on 2.5 mcg/kg/min.   Volume overall markedly improved but still volume up. CVP 11. SCr continues to trend down, 1.61>>1.59.    Switched back to PO diuretics yesterday, torsemide 60 mg. Only 450 cc in UOP yesterday. Wt up 2 lb.   Overall feels much better. OOB and sitting in chair.   Cardiac Procedures  RHC Procedural Findings (on dobutamine 5 mcg/kg/min): Hemodynamics (mmHg) RA mean 9 RV 48/4 PA 54/24, mean 34 PCWP mean 18 LV 101/7 AO 95/51 Oxygen saturations: PA 64% AO 100% Cardiac Output (Fick) 4.89  Cardiac Index (Fick) 2.59 PVR 3.27 WU Mitral valve mean gradient 11.5 mmHg Mitral valve area 1.67 cm^2   Objective:   Weight Range: 80.9 kg Body mass index is 31.58 kg/m.   Vital Signs:   Temp:  [97.5 F (36.4 C)-98.4 F (36.9 C)] 97.8 F (36.6 C) (03/16 0700) Pulse Rate:  [78-143] 87 (03/16 0600) Resp:  [16-22] 17 (03/16 0700) BP: (129-159)/(57-100) 129/69 (03/16 0700) SpO2:  [90 %-98 %] 93 % (03/16 0600) Weight:  [80.9 kg] 80.9 kg (03/16 0040) Last BM Date: 03/24/19  Weight change: Filed Weights   03/24/19 0100 03/25/19 0618 03/26/19 0040  Weight: 86.1 kg 80 kg 80.9 kg    Intake/Output:   Intake/Output Summary (Last 24 hours) at 03/26/2019 0906 Last data filed at 03/26/2019 0900 Gross per 24 hour  Intake 1230 ml  Output 450 ml  Net 780 ml      Physical Exam   PHYSICAL EXAM: CVP 11-12 General:  Well appearing WF sitting up in chair. No respiratory difficulty HEENT: normal Neck: supple. Elevated JVD, just below jaw line. Carotids 2+ bilat; no bruits. No lymphadenopathy or thyromegaly appreciated. Cor: PMI nondisplaced.  Irregularly irregular rhythm, tachy rate 1/6 SEM at RUSB Lungs: clear Abdomen: obese, soft, nontender, nondistended. No hepatosplenomegaly. No bruits or masses. Good bowel sounds. Extremities: no cyanosis, clubbing, rash, trace bilateral LEE up to knees, + bilateral unna boots Neuro: alert & oriented x 3, cranial nerves grossly intact. moves all 4 extremities w/o difficulty. Affect pleasant.    Telemetry   Atrial fibrillation 110s-130s (personally reviewed)  EKG   n/a   Labs    CBC Recent Labs    03/25/19 0601 03/26/19 0410  WBC 7.7 6.5  HGB 10.5* 10.2*  HCT 32.6* 32.0*  MCV 97.6 96.1  PLT 182 884   Basic Metabolic Panel Recent Labs    03/24/19 0552 03/24/19 0552 03/25/19 0601 03/25/19 0602 03/26/19 0410  NA 136   < >  --  136 135  K 3.9   < >  --  3.8 3.6  CL 94*   < >  --  92* 93*  CO2 31   < >  --  30 30  GLUCOSE 157*   < >  --  110* 141*  BUN 39*   < >  --  31* 26*  CREATININE 1.60*   < >  --  1.61* 1.59*  CALCIUM 10.1   < >  --  10.0 9.5  MG 1.6*  --  1.8  --   --   PHOS  4.1   < >  --  3.7 3.9   < > = values in this interval not displayed.   Liver Function Tests Recent Labs    03/25/19 0602 03/26/19 0410  ALBUMIN 3.5 3.4*   No results for input(s): LIPASE, AMYLASE in the last 72 hours. Cardiac Enzymes No results for input(s): CKTOTAL, CKMB, CKMBINDEX, TROPONINI in the last 72 hours.  BNP: BNP (last 3 results) Recent Labs    03/17/19 2213  BNP 338.0*    ProBNP (last 3 results) Recent Labs    09/12/18 1201  PROBNP 51.0     D-Dimer No results for input(s): DDIMER in the last 72 hours. Hemoglobin A1C No results for input(s): HGBA1C in the last 72 hours. Fasting Lipid Panel No results for input(s): CHOL, HDL, LDLCALC, TRIG, CHOLHDL, LDLDIRECT in the last 72 hours. Thyroid Function Tests No results for input(s): TSH, T4TOTAL, T3FREE, THYROIDAB in the last 72 hours.  Invalid input(s): FREET3  Other results:   Imaging    No  results found.   Medications:     Scheduled Medications: . acetaminophen  1,000 mg Oral Q8H  . apixaban  5 mg Oral BID  . atorvastatin  40 mg Oral Daily  . Chlorhexidine Gluconate Cloth  6 each Topical Daily  . darbepoetin (ARANESP) injection - NON-DIALYSIS  60 mcg Subcutaneous Q Wed-1800  . fluticasone  2 spray Each Nare Daily  . fluticasone furoate-vilanterol  1 puff Inhalation Daily  . insulin aspart  0-5 Units Subcutaneous QHS  . insulin aspart  0-9 Units Subcutaneous TID WC  . insulin detemir  30 Units Subcutaneous BID  . loratadine  10 mg Oral Daily  . mouth rinse  15 mL Mouth Rinse BID  . pantoprazole  40 mg Oral Daily  . ramelteon  8 mg Oral QHS  . sertraline  100 mg Oral Daily  . sodium chloride flush  10-40 mL Intracatheter Q12H  . sodium chloride flush  3 mL Intravenous Q12H  . sodium chloride flush  3 mL Intravenous Q12H  . sodium chloride flush  3 mL Intravenous Q12H  . torsemide  60 mg Oral Daily    Infusions: . sodium chloride    . sodium chloride 250 mL (03/24/19 0700)  . sodium chloride    . amiodarone 30 mg/hr (03/25/19 2356)  . DOBUTamine 2.5 mcg/kg/min (03/25/19 1019)    PRN Medications: sodium chloride, sodium chloride, sodium chloride, acetaminophen, albuterol, ALPRAZolam, camphor-menthol, ipratropium-albuterol, liver oil-zinc oxide, loperamide, ondansetron (ZOFRAN) IV, sodium chloride flush, sodium chloride flush, sodium chloride flush   Assessment/Plan   1. RV failure: Echo this admission shows LV EF 65-70% with RV moderate-severely dilated and moderate dysfunction, D-shaped septum, the mitral valve is severely calcified though mean gradient is only about 5 mmHg suggesting mild to moderate mitral stenosis (pressure half-time does not appear short), mild AS.  The patient was admitted markedly volume overloaded.  I am concerned that she has end-stage RV dysfunction.  She also has mitral stenosis which visually looks significant but measures only  mild-moderate by mean gradient and pressure half-time on echo.  She has been oliguric with creatinine up to 4.27 => down to 1.59 today on dobutamine.  Given her markedly low GFR and soft BP at times,  She was started on dobutamine. RHC/LHC this admit showed mildly elevated PCWP with moderate primarily pulmonary venous hypertension. Mitral stenosis appears moderate.  - Co-ox 67% today on 2.5 mcg/kg/min of dobutamine.  - Will stop dobutamine gtt today and  follow co-ox.  - Only 450 cc in UOP yesterday w/ PO torsemide. Wt up 2 lb.  - Increase torsemide to 60 mg bid  - Suspect prognosis overall will be poor with severe RV failure.  Will need to see if we can maintain improvement after weaning her off dobutamine.  2. Pulmonary hypertension: PASP estimated at 41 mmHg on echo this admission and 42 mmHg on echo in 2/20.  However, there has been suspicion for severe pulmonary hypertension based on dilated and dysfunctional RV.  There was concern for group 3 PH but recent high resolution chest CT in 2/21 does not show marked lung abnormality (there does not seem to be severe emphysema or interstitial lung disease). RHC/LHC 3/15 suggests primarily pulmonary venous hypertension due to significant mitral stenosis.  3. Mitral stenosis: The valve looks severely calcified and restricted, but hemodynamics by echo were not that impressive.  Mean gradient 5 mmHg and MVA 2.66 cm^2 by PHT suggested no more than mild mitral stenosis.  However, cath is suggestive of moderate mitral stenosis with mean gradient 11.5 mmHg and MVA 1.67 cm^2.   - She does not appear to be a valvuloplasty candidate due to her degree of calcification, and she would be a difficult surgical candidate (poor functional status).  - Will assess the mitral valve more closely later this week with TEE at time of DCCV.  4. Atrial flutter: The patient appears to have been in atrial flutter since at least 12/20.  Mild RVR.  - Off atenolol with AKI.  - Control HR  with amiodarone gtt  - Will stop dobutamine today.   - Continue apixaban  - Plan TEE-guided DCCV on Wednesday.   5. Acute on CKD stage 3: Creatinine peaked at 4.3. Todays creatinine is down to 1.59. Suspect cardiorenal syndrome in setting of RV failure.  She would be a poor candidate for long-term HD with RV failure.  - follow BMP w/ diuretics  6. Chronic hypoxemic respiratory failure:  She has a history of smoking, but high resolution CT lungs does not show clear evidence of severe COPD or of interstitial lung disease.   7. Hyponatremia: Sodium up with tolvaptan dose on 3/11.  Fluid restrict.    Continue to Mobilize with PT.   Length of Stay: [address] PA-C  03/26/2019, 9:06 AM  Advanced Heart Failure Team Pager [phone] (M-F; 7a - 4p)  Please contact [facility] for night-coverage after hours (4p -7a ) and weekends on [email or web address]  Patient seen with PA, agree with the above note.   Stable today, creatinine continues to trend down 1.59.  Volume status ok on RHC yesterday, looks ok on exam today.  - Stop dobutamine and repeat co-ox in am.  - Increase torsemide to 60 mg bid.   Continue amiodarone gtt and apixaban, plan for TEE-guided DCCV.  Will reassess mitral valve stenosis on TEE. Discussed risks/benefits with patient, she agrees to procedure.   [name] 03/26/2019 4:38 PM

## 2019-03-27 ENCOUNTER — Encounter (HOSPITAL_COMMUNITY)
Admission: EM | Disposition: A | Discharge status: Hospice | Payer: Self-pay | Source: Home / Self Care | Attending: Student

## 2019-03-27 ENCOUNTER — Inpatient Hospital Stay (HOSPITAL_COMMUNITY): Payer: Medicare HMO

## 2019-03-27 ENCOUNTER — Inpatient Hospital Stay (HOSPITAL_COMMUNITY): Payer: Medicare HMO | Admitting: Certified Registered Nurse Anesthetist

## 2019-03-27 DIAGNOSIS — I361 Nonrheumatic tricuspid (valve) insufficiency: Secondary | ICD-10-CM

## 2019-03-27 DIAGNOSIS — I4891 Unspecified atrial fibrillation: Secondary | ICD-10-CM

## 2019-03-27 DIAGNOSIS — I342 Nonrheumatic mitral (valve) stenosis: Secondary | ICD-10-CM

## 2019-03-27 DIAGNOSIS — Z7189 Other specified counseling: Secondary | ICD-10-CM

## 2019-03-27 HISTORY — PX: CARDIOVERSION: SHX1299

## 2019-03-27 HISTORY — PX: TEE WITHOUT CARDIOVERSION: SHX5443

## 2019-03-27 LAB — CBC
HCT: 33.3 % — ABNORMAL LOW (ref 36.0–46.0)
Hemoglobin: 10.4 g/dL — ABNORMAL LOW (ref 12.0–15.0)
MCH: 30.4 pg (ref 26.0–34.0)
MCHC: 31.2 g/dL (ref 30.0–36.0)
MCV: 97.4 fL (ref 80.0–100.0)
Platelets: 178 10*3/uL (ref 150–400)
RBC: 3.42 MIL/uL — ABNORMAL LOW (ref 3.87–5.11)
RDW: 15.9 % — ABNORMAL HIGH (ref 11.5–15.5)
WBC: 7.1 10*3/uL (ref 4.0–10.5)
nRBC: 0 % (ref 0.0–0.2)

## 2019-03-27 LAB — GLUCOSE, CAPILLARY
Glucose-Capillary: 114 mg/dL — ABNORMAL HIGH (ref 70–99)
Glucose-Capillary: 125 mg/dL — ABNORMAL HIGH (ref 70–99)
Glucose-Capillary: 110 mg/dL — ABNORMAL HIGH (ref 70–99)
Glucose-Capillary: 146 mg/dL — ABNORMAL HIGH (ref 70–99)
Glucose-Capillary: 150 mg/dL — ABNORMAL HIGH (ref 70–99)

## 2019-03-27 LAB — BASIC METABOLIC PANEL
Anion gap: 14 (ref 5–15)
BUN: 28 mg/dL — ABNORMAL HIGH (ref 8–23)
CO2: 31 mmol/L (ref 22–32)
Calcium: 9.5 mg/dL (ref 8.9–10.3)
Chloride: 94 mmol/L — ABNORMAL LOW (ref 98–111)
Creatinine, Ser: 1.56 mg/dL — ABNORMAL HIGH (ref 0.44–1.00)
GFR calc Af Amer: 37 mL/min — ABNORMAL LOW (ref >60)
GFR calc non Af Amer: 32 mL/min — ABNORMAL LOW (ref >60)
Glucose, Bld: 123 mg/dL — ABNORMAL HIGH (ref 70–99)
Potassium: 3.3 mmol/L — ABNORMAL LOW (ref 3.5–5.1)
Sodium: 139 mmol/L (ref 135–145)

## 2019-03-27 LAB — COOXEMETRY PANEL
Carboxyhemoglobin: 1.5 % (ref 0.5–1.5)
Methemoglobin: 0.6 % (ref 0.0–1.5)
O2 Saturation: 49.5 %
Total hemoglobin: 10.5 g/dL — ABNORMAL LOW (ref 12.0–16.0)

## 2019-03-27 SURGERY — ECHOCARDIOGRAM, TRANSESOPHAGEAL
Anesthesia: General

## 2019-03-27 MED ORDER — TORSEMIDE 20 MG PO TABS
80.0000 mg | ORAL_TABLET | Freq: Every day | ORAL | Status: DC
  Administered 2019-03-28: 80 mg via ORAL
  Filled 2019-03-27: qty 4

## 2019-03-27 MED ORDER — SODIUM CHLORIDE 0.9 % IV SOLN: INTRAVENOUS | Status: DC | PRN

## 2019-03-27 MED ORDER — POTASSIUM CHLORIDE CRYS ER 20 MEQ PO TBCR
40.0000 meq | EXTENDED_RELEASE_TABLET | Freq: Once | ORAL | Status: AC
  Administered 2019-03-27: 40 meq via ORAL
  Filled 2019-03-27: qty 2

## 2019-03-27 MED ORDER — BUTAMBEN-TETRACAINE-BENZOCAINE 2-2-14 % EX AERO
INHALATION_SPRAY | CUTANEOUS | Status: DC | PRN
  Administered 2019-03-27 (×2): 2 via TOPICAL

## 2019-03-27 MED ORDER — MUPIROCIN 2 % EX OINT
1.0000 "application " | TOPICAL_OINTMENT | Freq: Two times a day (BID) | CUTANEOUS | Status: DC
  Administered 2019-03-27 – 2019-03-28 (×3): 1 via NASAL
  Filled 2019-03-27 (×2): qty 22

## 2019-03-27 MED ORDER — PROPOFOL 500 MG/50ML IV EMUL
INTRAVENOUS | Status: DC | PRN
  Administered 2019-03-27: 75 ug/kg/min via INTRAVENOUS

## 2019-03-27 MED ORDER — PROPOFOL 10 MG/ML IV BOLUS
INTRAVENOUS | Status: DC | PRN
  Administered 2019-03-27: 20 mg via INTRAVENOUS
  Administered 2019-03-27 (×2): 10 mg via INTRAVENOUS

## 2019-03-27 MED ORDER — LIDOCAINE 2% (20 MG/ML) 5 ML SYRINGE
INTRAMUSCULAR | Status: DC | PRN
  Administered 2019-03-27: 20 mg via INTRAVENOUS

## 2019-03-27 NOTE — Progress Notes (Signed)
Patient is NPO waiting on Cardioversion procedure this am due to continued Afib problems,her consent is signed the Endo Nurse has called been updated on status of patient she reported would come for patient around 1230 today. Patient is aware her time has been bumped up some. No further changes noted.

## 2019-03-27 NOTE — Progress Notes (Signed)
  Echocardiogram Echocardiogram Transesophageal has been performed.  Matilde Bash 03/27/2019, 2:25 PM

## 2019-03-27 NOTE — Procedures (Signed)
Electrical Cardioversion Procedure Note Ellen Jordan [id number] Nov 13, 1945  Procedure: Electrical Cardioversion Indications:  Atrial Fibrillation  Procedure Details Consent: Risks of procedure as well as the alternatives and risks of each were explained to the (patient/caregiver).  Consent for procedure obtained. Time Out: Verified patient identification, verified procedure, site/side was marked, verified correct patient position, special equipment/implants available, medications/allergies/relevent history reviewed, required imaging and test results available.  Performed  Patient placed on cardiac monitor, pulse oximetry, supplemental oxygen as necessary.  Sedation given: Propofol per anesthesiology Pacer pads placed anterior and posterior chest.  Cardioverted 1 time(s).  Cardioverted at [place].  Evaluation Findings: Post procedure EKG shows: NSR Complications: None Patient did tolerate procedure well.   Ellen Jordan 03/27/2019, 2:08 PM

## 2019-03-27 NOTE — Anesthesia Procedure Notes (Signed)
Procedure Name: MAC Date/Time: 03/27/2019 1:50 PM Performed by: Janene Harvey, CRNA Pre-anesthesia Checklist: Patient identified, Emergency Drugs available, Suction available and Patient being monitored Dental Injury: Teeth and Oropharynx as per pre-operative assessment

## 2019-03-27 NOTE — Progress Notes (Signed)
Patient ID: BERTRICE LEDER, female   DOB: [birth year], 74 y.o.   MRN: [id number]     Advanced Heart Failure Rounding Note  PCP-Cardiologist: Rozann Lesches, MD   Subjective:    TEE-guided DCCV today, patient back in NSR.  TEE showed EF 60-65%, moderately dilated RV with mildly decreased systolic function, mild-moderate mitral stenosis.  She is now off dobutamine, early am co-ox 50% but pre-DCCV.   Creatinine 1.56 today, stable. Now on po torsemide.   RHC Procedural Findings (on dobutamine 5 mcg/kg/min): Hemodynamics (mmHg) RA mean 9 RV 48/4 PA 54/24, mean 34 PCWP mean 18 LV 101/7 AO 95/51 Oxygen saturations: PA 64% AO 100% Cardiac Output (Fick) 4.89  Cardiac Index (Fick) 2.59 PVR 3.27 WU Mitral valve mean gradient 11.5 mmHg Mitral valve area 1.67 cm^2   Objective:   Weight Range: 78.9 kg Body mass index is 30.81 kg/m.   Vital Signs:   Temp:  [97.5 F (36.4 C)-98.3 F (36.8 C)] 98.2 F (36.8 C) (03/17 1317) Pulse Rate:  [102-109] 109 (03/17 1222) Resp:  [16-20] 16 (03/17 1317) BP: (118-174)/(71-98) 174/98 (03/17 1317) SpO2:  [94 %-98 %] 97 % (03/17 1317) Weight:  [78.9 kg] 78.9 kg (03/17 1317) Last BM Date: 03/26/19  Weight change: Filed Weights   03/26/19 0040 03/27/19 0250 03/27/19 1317  Weight: 80.9 kg 78.9 kg 78.9 kg    Intake/Output:   Intake/Output Summary (Last 24 hours) at 03/27/2019 1416 Last data filed at 03/27/2019 1410 Gross per 24 hour  Intake 510 ml  Output 2550 ml  Net -2040 ml      Physical Exam   General: NAD Neck: JVP 8, no thyromegaly or thyroid nodule.  Lungs: Clear to auscultation bilaterally with normal respiratory effort. CV: Nondisplaced PMI.  Heart mildly tachy, irregular S1/S2, no S3/S4, 1/6 SEM RUSB.  No peripheral edema.   Abdomen: Soft, nontender, no hepatosplenomegaly, no distention.  Skin: Intact without lesions or rashes.  Neurologic: Alert and oriented x 3.  Psych: Normal affect. Extremities: No clubbing or  cyanosis.  HEENT: Normal.    Telemetry   Atrial fibrillation 110s-130s => NSR 80s with DCCV (personally reviewed)  EKG   n/a   Labs    CBC Recent Labs    03/26/19 0410 03/27/19 0425  WBC 6.5 7.1  HGB 10.2* 10.4*  HCT 32.0* 33.3*  MCV 96.1 97.4  PLT 169 829   Basic Metabolic Panel Recent Labs    03/25/19 0601 03/25/19 0602 03/25/19 0737 03/26/19 0410 03/27/19 0425  NA  --  136   < > 135 139  K  --  3.8   < > 3.6 3.3*  CL  --  92*   < > 93* 94*  CO2  --  30   < > 30 31  GLUCOSE  --  110*   < > 141* 123*  BUN  --  31*   < > 26* 28*  CREATININE  --  1.61*   < > 1.59* 1.56*  CALCIUM  --  10.0   < > 9.5 9.5  MG 1.8  --   --   --   --   PHOS  --  3.7  --  3.9  --    < > = values in this interval not displayed.   Liver Function Tests Recent Labs    03/25/19 0602 03/26/19 0410  ALBUMIN 3.5 3.4*   No results for input(s): LIPASE, AMYLASE in the last 72 hours. Cardiac Enzymes No results for  input(s): CKTOTAL, CKMB, CKMBINDEX, TROPONINI in the last 72 hours.  BNP: BNP (last 3 results) Recent Labs    03/17/19 2213  BNP 338.0*    ProBNP (last 3 results) Recent Labs    09/12/18 1201  PROBNP 51.0     D-Dimer No results for input(s): DDIMER in the last 72 hours. Hemoglobin A1C No results for input(s): HGBA1C in the last 72 hours. Fasting Lipid Panel No results for input(s): CHOL, HDL, LDLCALC, TRIG, CHOLHDL, LDLDIRECT in the last 72 hours. Thyroid Function Tests No results for input(s): TSH, T4TOTAL, T3FREE, THYROIDAB in the last 72 hours.  Invalid input(s): FREET3  Other results:   Imaging    No results found.   Medications:     Scheduled Medications: . [MAR Hold] acetaminophen  1,000 mg Oral Q8H  . [MAR Hold] apixaban  5 mg Oral BID  . [MAR Hold] atorvastatin  40 mg Oral Daily  . [MAR Hold] Chlorhexidine Gluconate Cloth  6 each Topical Daily  . [MAR Hold] darbepoetin (ARANESP) injection - NON-DIALYSIS  60 mcg Subcutaneous Q Wed-1800    . [MAR Hold] feeding supplement (ENSURE ENLIVE)  237 mL Oral BID BM  . [MAR Hold] fluticasone  2 spray Each Nare Daily  . [MAR Hold] fluticasone furoate-vilanterol  1 puff Inhalation Daily  . [MAR Hold] insulin aspart  0-9 Units Subcutaneous TID WC  . [MAR Hold] insulin detemir  20 Units Subcutaneous BID  . [MAR Hold] loratadine  10 mg Oral Daily  . [MAR Hold] mouth rinse  15 mL Mouth Rinse BID  . [MAR Hold] multivitamin with minerals  1 tablet Oral Daily  . [MAR Hold] mupirocin ointment  1 application Nasal BID  . [MAR Hold] pantoprazole  40 mg Oral Daily  . potassium chloride  40 mEq Oral Once  . [MAR Hold] ramelteon  8 mg Oral QHS  . [MAR Hold] sertraline  100 mg Oral Daily  . [MAR Hold] sodium chloride flush  10-40 mL Intracatheter Q12H  . [MAR Hold] sodium chloride flush  3 mL Intravenous Q12H  . [MAR Hold] sodium chloride flush  3 mL Intravenous Q12H  . [MAR Hold] sodium chloride flush  3 mL Intravenous Q12H  . [START ON 03/28/2019] torsemide  80 mg Oral Daily    Infusions: . [MAR Hold] sodium chloride    . [MAR Hold] sodium chloride 250 mL (03/24/19 0700)  . [MAR Hold] sodium chloride    . sodium chloride 20 mL/hr at 03/27/19 1257  . amiodarone 30 mg/hr (03/27/19 1346)    PRN Medications: [MAR Hold] sodium chloride, [MAR Hold] sodium chloride, [MAR Hold] sodium chloride, [MAR Hold] acetaminophen, [MAR Hold] albuterol, [MAR Hold] ALPRAZolam, [MAR Hold] camphor-menthol, [MAR Hold] ipratropium-albuterol, [MAR Hold] liver oil-zinc oxide, [MAR Hold] loperamide, [MAR Hold] ondansetron (ZOFRAN) IV, [MAR Hold] sodium chloride flush, [MAR Hold] sodium chloride flush, [MAR Hold] sodium chloride flush   Assessment/Plan   1. RV failure: Echo this admission shows LV EF 65-70% with RV moderate-severely dilated and moderate dysfunction, D-shaped septum, the mitral valve is severely calcified though mean gradient is only about 5 mmHg suggesting mild to moderate mitral stenosis (pressure  half-time does not appear short), mild AS.  The patient was admitted markedly volume overloaded.  I am concerned that she has end-stage RV dysfunction.  She also has mitral stenosis which visually looks significant but measures only mild-moderate by mean gradient and pressure half-time on echo, this was confirmed by TEE (mild-moderate MS). She has been oliguric with creatinine  up to 4.27 => down to 1.56 today, initially on dobutamine, now off. RHC/LHC this admit showed mildly elevated PCWP with moderate primarily pulmonary venous hypertension. Mitral stenosis appeared moderate by cath but only mild-moderate by TEE and echo.  Volume status looks optimized today.  - Stay off dobutamine, repeat co-ox in am.  - If creatinine remains stable, may add digoxin for RV.  - Change diuretic to torsemide 80 mg daily.  - Suspect prognosis overall will be poor with severe RV failure.   2. Pulmonary hypertension: PASP estimated at 41 mmHg on echo this admission and 42 mmHg on echo in 2/20.  However, there has been suspicion for severe pulmonary hypertension based on dilated and dysfunctional RV.  There was concern for group 3 PH but recent high resolution chest CT in 2/21 does not show marked lung abnormality (there does not seem to be severe emphysema or interstitial lung disease). RHC/LHC 3/15 suggests primarily pulmonary venous hypertension due to diastolic dysfunction and ?significant mitral stenosis (mild-moderate by TEE/TTE, moderate by RHC/LHC).  3. Mitral stenosis: The valve looks severely calcified and restricted, but hemodynamics by echo were not that impressive.  Mean gradient 5 mmHg and MVA 2.66 cm^2 by PHT suggested no more than mild mitral stenosis.  However, cath was suggestive of moderate mitral stenosis with mean gradient 11.5 mmHg and MVA 1.67 cm^2.  TEE was done today, also looks like mild-moderate MS with mean gradient 5 mmHg and MVA 2.2 cm^2.  - She does not appear to be a valvuloplasty candidate due to  her degree of calcification, and she would be a poor surgical candidate (poor functional status).  4. Atrial flutter: The patient appears to have been in atrial flutter since at least 12/20.  Mild RVR. TEE-guided DCCV today, now in NSR.  - Continue amiodarone gtt today, then to po. - Continue apixaban  5. Acute on CKD stage 3: Creatinine peaked at 4.3. Todays creatinine is down to 1.56. Suspect cardiorenal syndrome in setting of RV failure.  She would be a poor candidate for long-term HD with RV failure.  - follow BMP w/ diuretics  6. Chronic hypoxemic respiratory failure:  She has a history of smoking, but high resolution CT lungs does not show clear evidence of severe COPD or of interstitial lung disease.   7. Hyponatremia: Sodium up with tolvaptan dose on 3/11.  Fluid restrict.    Continue to Mobilize with PT.   Length of Stay: 9  [name], MD  03/27/2019, 2:16 PM  Advanced Heart Failure Team Pager [phone] (M-F; [place])  Please contact [facility] for night-coverage after hours (4p -7a ) and weekends on [email or web address]

## 2019-03-27 NOTE — Progress Notes (Signed)
Daily Progress Note   Patient Name: Ellen Jordan       Date: 03/27/2019 DOB: Dec 02, 1945  Age: 74 y.o. MRN#: [id number] Attending Physician: Oswald Hillock, MD Primary Care Physician: [name], MD Admit Date: 03/17/2019  Reason for Consultation/Follow-up: Establishing goals of care  Subjective: Patient sitting up in chair, visiting with her husband- feels well, hungry, ready for her procedure.  Spouse has spoken with hospice representative. Ms. Dimercurio requested that I called her daughter in law to discuss hospice at home. I spoke with Domenic Moras who expressed concerns that patient and spouse did not understand the Hospice philosophy and worried that Hospice would not be enough support for them in the home.  I reviewed the previous Palliative provider's discussions with Mr and Mrs Queener with [facility] that included discussion of Hospice philosophy and services provided as well as the Hospice liaison has met with Mr. Buffin to discuss services.   ROS  Length of Stay: 9  Current Medications: Scheduled Meds:  . acetaminophen  1,000 mg Oral Q8H  . apixaban  5 mg Oral BID  . atorvastatin  40 mg Oral Daily  . Chlorhexidine Gluconate Cloth  6 each Topical Daily  . darbepoetin (ARANESP) injection - NON-DIALYSIS  60 mcg Subcutaneous Q Wed-1800  . feeding supplement (ENSURE ENLIVE)  237 mL Oral BID BM  . fluticasone  2 spray Each Nare Daily  . fluticasone furoate-vilanterol  1 puff Inhalation Daily  . insulin aspart  0-9 Units Subcutaneous TID WC  . insulin detemir  20 Units Subcutaneous BID  . loratadine  10 mg Oral Daily  . mouth rinse  15 mL Mouth Rinse BID  . multivitamin with minerals  1 tablet Oral Daily  . mupirocin ointment  1 application Nasal BID  . pantoprazole  40 mg Oral Daily    . ramelteon  8 mg Oral QHS  . sertraline  100 mg Oral Daily  . sodium chloride flush  10-40 mL Intracatheter Q12H  . sodium chloride flush  3 mL Intravenous Q12H  . sodium chloride flush  3 mL Intravenous Q12H  . sodium chloride flush  3 mL Intravenous Q12H  . torsemide  60 mg Oral BID    Continuous Infusions: . sodium chloride    . sodium chloride 250 mL (03/24/19 0700)  .  sodium chloride    . sodium chloride    . amiodarone 30 mg/hr (03/27/19 0255)    PRN Meds: sodium chloride, sodium chloride, sodium chloride, acetaminophen, albuterol, ALPRAZolam, camphor-menthol, ipratropium-albuterol, liver oil-zinc oxide, loperamide, ondansetron (ZOFRAN) IV, sodium chloride flush, sodium chloride flush, sodium chloride flush  Physical Exam          Vital Signs: BP 129/71 (BP Location: Left Arm)   Pulse (!) 102   Temp 97.7 F (36.5 C) (Oral)   Resp 20   Ht 5' 3" (1.6 m)   Wt 78.9 kg Comment: scale a  SpO2 97%   BMI 30.82 kg/m  SpO2: SpO2: 97 % O2 Device: O2 Device: Room Air O2 Flow Rate: O2 Flow Rate (L/min): 2 L/min  Intake/output summary:   Intake/Output Summary (Last 24 hours) at 03/27/2019 1222 Last data filed at 03/27/2019 0900 Gross per 24 hour  Intake 480 ml  Output 2950 ml  Net -2470 ml   LBM: Last BM Date: 03/26/19 Baseline Weight: Weight: 79.4 kg Most recent weight: Weight: 78.9 kg(scale a)       Palliative Assessment/Data: PPS: 40%    Flowsheet Rows     Most Recent Value  Intake Tab  Referral Department  Hospitalist  Unit at Time of Referral  Cardiac/Telemetry Unit  Palliative Care Primary Diagnosis  Cardiac  Date Notified  03/21/19  Palliative Care Type  New Palliative care  Reason for referral  Clarify Goals of Care  Date of Admission  03/17/19  Date first seen by Palliative Care  03/21/19  # of days Palliative referral response time  0 Day(s)  # of days IP prior to Palliative referral  4  Clinical Assessment  Palliative Performance Scale Score   50%  Psychosocial & Spiritual Assessment  Palliative Care Outcomes  Patient/Family meeting held?  Yes  Who was at the meeting?  patient  Palliative Care Outcomes  Clarified goals of care, Provided end of life care assistance, Provided psychosocial or spiritual support, ACP counseling assistance      Patient Active Problem List   Diagnosis Date Noted  . Palliative care by specialist   . Goals of care, counseling/discussion   . Chronic atrial fibrillation ([place])   . Acute respiratory failure with hypoxemia ([place])   . RVF (right ventricular failure) ([place])   . AKI (acute kidney injury) ([place]) 03/18/2019  . Uncontrolled type 2 diabetes mellitus with hyperglycemia ([place]) 02/19/2019  . Atrial fibrillation with RVR ([place]) 12/18/2018  . Hypokalemia 12/18/2018  . Normocytic anemia 12/18/2018  . Mediastinal lymphadenopathy 12/18/2018  . Pulmonary nodules 12/18/2018  . Pulmonary hypertension ([place]) 09/08/2018  . History of tobacco abuse 11/09/2016  . Controlled diabetes mellitus type 2 with complications ([place]) 86/76/7209  . Restless leg syndrome 11/04/2016  . PAF (paroxysmal atrial fibrillation) ([place]) 06/03/2016  . RBBB 06/03/2016  . SOB (shortness of breath)   . Atrial fibrillation with rapid ventricular response ([place]) 02/28/2015  . Uncontrolled type 2 diabetes mellitus without complication, with long-term current use of insulin 12/24/2014  . Mixed hyperlipidemia 12/24/2014  . Essential hypertension, benign 12/24/2014    Palliative Care Assessment & Plan   Patient Profile: 74 y.o. female  with past medical history of COPD on 2L oxygen, afib on Eliquis, diastolic CHF, pulmonary hypertension, CKD admitted on 03/17/2019 with recurrent falls and weakness. Hospital admission for acute on chronic diastolic CHF/RV failure with pulmonary hypertension and mitral valve stenosis. Heart failure team following. Patient receiving IV dobutamine, IV lasix, and metolazone. AKI on  CKD stage 3, creat on admit 4.37.  Nephrology following. Possibly cardiorenal. Patient is not a candidate for HD with RV failure. Tentative RHC this admit. Palliative medicine consultation for goals of care.   Assessment/Recommendations/Plan   Plan for DCCV today  D/C home with Hospice when ready  I will f/u with Ms. And Mr Demeyer to ensure they have good understanding of Hospice philosophy, pt prognosis and Hospice services  Goals of Care and Additional Recommendations:  Limitations on Scope of Treatment: Full Scope Treatment  Code Status:  DNR  Prognosis:   Unable to determine  Discharge Planning:  To Be Determined  Care plan was discussed with patient, spouse and patient's daughter in law.   Thank you for allowing the Palliative Medicine Team to assist in the care of this patient.   Time In: 1130 Time Out: 1230 Total Time 60 mins Prolonged Time Billed yes      Greater than 50%  of this time was spent counseling and coordinating care related to the above assessment and plan.  Mariana Kaufman, AGNP-C Palliative Medicine   Please contact Palliative Medicine Team phone at [phone] for questions and concerns.

## 2019-03-27 NOTE — Anesthesia Postprocedure Evaluation (Signed)
Anesthesia Post Note  Patient: Ellen Jordan  Procedure(s) Performed: TRANSESOPHAGEAL ECHOCARDIOGRAM (TEE) (N/A ) CARDIOVERSION (N/A )     Patient location during evaluation: Endoscopy Anesthesia Type: General Level of consciousness: awake and alert Pain management: pain level controlled Vital Signs Assessment: post-procedure vital signs reviewed and stable Respiratory status: spontaneous breathing, nonlabored ventilation, respiratory function stable and patient connected to nasal cannula oxygen Cardiovascular status: blood pressure returned to baseline and stable Postop Assessment: no apparent nausea or vomiting Anesthetic complications: no    Last Vitals:  Vitals:   03/27/19 1825 03/27/19 2000  BP: (!) 154/59 (!) 159/74  Pulse: 94   Resp: 20 (!) 21  Temp: (!) 36.3 C 37.4 C  SpO2: 92% 93%    Last Pain:  Vitals:   03/27/19 2000  TempSrc: Oral  PainSc:                  Karyl Kinnier [place]

## 2019-03-27 NOTE — Interval H&P Note (Signed)
History and Physical Interval Note:  03/27/2019 1:50 PM  Ellen Jordan  has presented today for surgery, with the diagnosis of AFIB.  The various methods of treatment have been discussed with the patient and family. After consideration of risks, benefits and other options for treatment, the patient has consented to  Procedure(s): TRANSESOPHAGEAL ECHOCARDIOGRAM (TEE) (N/A) CARDIOVERSION (N/A) as a surgical intervention.  The patient's history has been reviewed, patient examined, no change in status, stable for surgery.  I have reviewed the patient's chart and labs.  Questions were answered to the patient's satisfaction.     Ellen Jordan [employer]

## 2019-03-27 NOTE — Transfer of Care (Signed)
Immediate Anesthesia Transfer of Care Note  Patient: Ellen Jordan  Procedure(s) Performed: TRANSESOPHAGEAL ECHOCARDIOGRAM (TEE) (N/A ) CARDIOVERSION (N/A )  Patient Location: Endoscopy Unit  Anesthesia Type:General  Level of Consciousness: drowsy  Airway & Oxygen Therapy: Patient Spontanous Breathing and Patient connected to nasal cannula oxygen  Post-op Assessment: Report given to RN and Post -op Vital signs reviewed and stable  Post vital signs: Reviewed  Last Vitals:  Vitals Value Taken Time  BP    Temp    Pulse    Resp    SpO2      Last Pain:  Vitals:   03/27/19 1317  TempSrc: Temporal  PainSc: 0-No pain      Patients Stated Pain Goal: 0 (05/69/79 4801)  Complications: No apparent anesthesia complications

## 2019-03-27 NOTE — Anesthesia Preprocedure Evaluation (Addendum)
Anesthesia Evaluation  Patient identified by MRN, date of birth, ID band Patient awake    Reviewed: Allergy & Precautions, NPO status , Patient's Chart, lab work & pertinent test results  Airway Mallampati: III  TM Distance: >3 FB Neck ROM: Full    Dental  (+) Edentulous Upper, Edentulous Lower   Pulmonary shortness of breath and Long-Term Oxygen Therapy, former smoker,    Pulmonary exam normal breath sounds clear to auscultation       Cardiovascular hypertension, Pt. on home beta blockers and Pt. on medications + dysrhythmias Atrial Fibrillation  Rhythm:Irregular Rate:Tachycardia  ECG: rate 112, Atrial flutter Rightward axis Pulmonary disease pattern  ECHO: 1. Left ventricular ejection fraction, by estimation, is 60 to 65%. The left ventricle has normal function. The left ventricle has no regional wall motion abnormalities. Left ventricular diastolic parameters are indeterminate. There is the interventricular septum is flattened in systole and diastole, consistent with right ventricular pressure and volume overload. 2. Right ventricular systolic function is mildly reduced. The right ventricular size is moderately to severely enlarged. There is moderately elevated pulmonary artery systolic pressure. The estimated right ventricular systolic pressure is 11.9 mmHg. 3. Left atrial size was mildly dilated. 4. Right atrial size was moderately dilated. 5. Mitral leaflets show significantly decreased excursion, although mean gradient suggests mild mitral stenosis.. The mitral valve is degenerative, severely calcified and associated with severe annular calcification. Trivial mitral valve regurgitation. 6. Tricuspid valve regurgitation is moderate. 7. The aortic valve is tricuspid. Aortic valve regurgitation is not visualized. Mild aortic valve stenosis. Aortic valve mean gradient measures 5.7 mmHg. Aortic valve Vmax measures  1.44 m/s. 8. The inferior vena cava is dilated in size with <50% respiratory variability, suggesting right atrial pressure of 15 mmHg.   Neuro/Psych PSYCHIATRIC DISORDERS Anxiety Depression negative neurological ROS     GI/Hepatic Neg liver ROS, GERD  Medicated and Controlled,  Endo/Other  diabetes, Insulin Dependent  Renal/GU Renal InsufficiencyRenal disease     Musculoskeletal negative musculoskeletal ROS (+)   Abdominal (+) + obese,   Peds  Hematology  (+) anemia , [name]   Anesthesia Other Findings A-FIB  Reproductive/Obstetrics                            Anesthesia Physical Anesthesia Plan  ASA: IV  Anesthesia Plan: General   Post-op Pain Management:    Induction: Intravenous  PONV Risk Score and Plan: 3 and Propofol infusion and Treatment may vary due to age or medical condition  Airway Management Planned: Nasal Cannula  Additional Equipment:   Intra-op Plan:   Post-operative Plan:   Informed Consent: I have reviewed the patients History and Physical, chart, labs and discussed the procedure including the risks, benefits and alternatives for the proposed anesthesia with the patient or authorized representative who has indicated his/her understanding and acceptance.   Patient has DNR.  Discussed DNR with patient and Suspend DNR.     Plan Discussed with: CRNA  Anesthesia Plan Comments:        Anesthesia Quick Evaluation

## 2019-03-27 NOTE — Progress Notes (Signed)
Physical Therapy Treatment Patient Details Name: Ellen Jordan MRN: [id number] DOB: 04/30/1945 Today's Date: 03/27/2019    History of Present Illness Ellen Jordan  is a 74 y.o. female, with history of chronic pain atrial fibrillation, on chronic anticoagulation with Eliquis, diabetes mellitus type 2, anxiety, hypertension, pulmonary hypertension, came to hospital with worsening shortness of breath.  Patient says that shortness of breath got worse over the past few days.  She also complains of rectal bleeding tonight that occurs occasionally when she has hard bowel movement.  Denies chest pain, abdominal pain or dysuria.  She is followed by pulmonology for COPD and medication induced interstitial lung disease. Pt transferred to [facility] on 3/9.  Pt s/p R heart cathererization 03/25/19.    PT Comments    Pt OOB in recliner upon arrival of PT, agreeable to PT session with focus on progressing mobility and activty tolerance. The pt continues to present with limitations in functional mobility and dynamic stability compared to their prior level of function and independence due to above dx and resulting decreased mobility. However, the pt was able to demo good tolerance for ambulation and higher-level balance challenges today without use of RW with no LOB. The pt will continue to benefit from skilled PT to regain endurance and independence with mobility.     Follow Up Recommendations  Home health PT     Equipment Recommendations  Rolling walker with 5" wheels (for safety)   Recommendations for Other Services       Precautions / Restrictions Precautions Precautions: Fall;Other (comment) Precaution Comments: Monitor HR and SpO2, pt reports she has been on 2L O2 at home Restrictions Weight Bearing Restrictions: No    Mobility  Bed Mobility               General bed mobility comments: Pt seated in bedside chair upon PT arrival  Transfers Overall transfer level: Needs assistance Equipment  used: None Transfers: Sit to/from Stand;Stand Pivot Transfers Sit to Stand: Supervision;Min guard Stand pivot transfers: Supervision;Min guard       General transfer comment: Initially min guard, progressing to supervision. Pt ambulated without an AD. Noted 0 instances of LOB.   Ambulation/Gait Ambulation/Gait assistance: Min guard Gait Distance (Feet): 40 Feet(pt ambulated in room 10 ft x4, followed by seated rest and then 10 ft x 4 again) Assistive device: None Gait Pattern/deviations: Wide base of support;Decreased stride length;Shuffle Gait velocity: 0.27 m/s Gait velocity interpretation: <1.8 ft/sec, indicate of risk for recurrent falls General Gait Details: pt able to ambulate in room without AD, pt initially taking small shuffle steps with increased lateral movement. also challenged with additional trials of walking backwards and with marching steps to practice dynamic SLS and work on improved balance without AD   Stairs             Wheelchair Mobility    Modified Rankin (Stroke Patients Only)       Balance Overall balance assessment: Needs assistance Sitting-balance support: No upper extremity supported;Feet supported Sitting balance-Leahy Scale: Good Sitting balance - Comments: supervision   Standing balance support: No upper extremity supported;During functional activity Standing balance-Leahy Scale: Fair Standing balance comment: pt able to ambulate without AD, increaed lateral sway but no LOB             High level balance activites: Backward walking;Other (comment)(walking marches)              Cognition Arousal/Alertness: Awake/alert Behavior During Therapy: WFL for tasks assessed/performed Overall Cognitive Status:  Within Functional Limits for tasks assessed                                 General Comments: Pt pleasant and eager to participate in therapy      Exercises      General Comments General comments (skin  integrity, edema, etc.): no SOB, HR high of 146 today, normally in 119-125 range      Pertinent Vitals/Pain Pain Assessment: No/denies pain Pain Intervention(s): Limited activity within patient's tolerance;Monitored during session;Repositioned    Home Living                      Prior Function            PT Goals (current goals can now be found in the care plan section) Acute Rehab PT Goals Patient Stated Goal: return home and not go to rehab PT Goal Formulation: With patient Time For Goal Achievement: 04/02/19 Potential to Achieve Goals: Good Progress towards PT goals: Progressing toward goals    Frequency    Min 3X/week      PT Plan Discharge plan needs to be updated    Co-evaluation              AM-PAC PT "6 Clicks" Mobility   Outcome Measure  Help needed turning from your back to your side while in a flat bed without using bedrails?: None Help needed moving from lying on your back to sitting on the side of a flat bed without using bedrails?: None Help needed moving to and from a bed to a chair (including a wheelchair)?: A Little Help needed standing up from a chair using your arms (e.g., wheelchair or bedside chair)?: None Help needed to walk in hospital room?: A Little Help needed climbing 3-5 steps with a railing? : A Little 6 Click Score: 21    End of Session Equipment Utilized During Treatment: Gait belt Activity Tolerance: Patient tolerated treatment well Patient left: in chair;with call bell/phone within reach   PT Visit Diagnosis: Unsteadiness on feet (R26.81);Other abnormalities of gait and mobility (R26.89);Muscle weakness (generalized) (M62.81)     Time: 8250-0370 PT Time Calculation (min) (ACUTE ONLY): 25 min  Charges:  $Gait Training: 23-37 mins                     Karma Ganja, PT, DPT   Acute Rehabilitation Department Pager #: [phone]   Otho Bellows 03/27/2019, 12:31 PM

## 2019-03-27 NOTE — Progress Notes (Signed)
Patient taken down in wheelchair for her procedure,husband at bedside waiting for her return. Report given to pickup nurse on arrival. No further changes noted.

## 2019-03-27 NOTE — Progress Notes (Signed)
Occupational Therapy Treatment Patient Details Name: Ellen Jordan MRN: [id number] DOB: 12/21/1945 Today's Date: 03/27/2019    History of present illness Ellen Jordan  is a 74 y.o. female, with history of chronic pain atrial fibrillation, on chronic anticoagulation with Eliquis, diabetes mellitus type 2, anxiety, hypertension, pulmonary hypertension, came to hospital with worsening shortness of breath.  Patient says that shortness of breath got worse over the past few days.  She also complains of rectal bleeding tonight that occurs occasionally when she has hard bowel movement.  Denies chest pain, abdominal pain or dysuria.  She is followed by pulmonology for COPD and medication induced interstitial lung disease. Pt transferred to [facility] on 3/9.  Pt s/p R heart cathererization 03/25/19.   OT comments  RN Barbie cleared pt to be seen. Pt making progress in therapy, demonstrating improved activity tolerance and independence with self-care and mobility tasks. Educated/instructed pt on safety strategies, activity pacing, and fall prevention with good understanding and follow through. Pt able to ambulate to/from bathroom without an assistive device. Pt initially required min guard for balance and safety, however able to progress to supervision. Pt completed toileting task utilizing grab bars for assist. Pt tolerated standing 1 x 5 min at the sink to complete grooming/hygiene tasks with supervision. Noted 0 instances of LOB throughout. HR increased to 147bpm during activity, dropping back down to 112bpm at end of session. OT will continue to follow acutely. Recommend [place] OT for continued rehab following hospital discharge.    Follow Up Recommendations  Home health OT;Supervision - Intermittent    Equipment Recommendations  3 in 1 bedside commode    Recommendations for Other Services      Precautions / Restrictions Precautions Precautions: Fall;Other (comment) Precaution Comments: Monitor  HR Restrictions Weight Bearing Restrictions: No       Mobility Bed Mobility               General bed mobility comments: Pt seated in bedside chair upon OT arrival  Transfers Overall transfer level: Needs assistance Equipment used: None Transfers: Sit to/from Stand;Stand Pivot Transfers Sit to Stand: Supervision;Min guard Stand pivot transfers: Supervision;Min guard       General transfer comment: Initially min guard, progressing to supervision. Pt ambulated without an AD. Noted 0 instances of LOB.     Balance Overall balance assessment: Needs assistance Sitting-balance support: No upper extremity supported;Feet supported Sitting balance-Leahy Scale: Good       Standing balance-Leahy Scale: Fair                             ADL either performed or assessed with clinical judgement   ADL Overall ADL's : Needs assistance/impaired     Grooming: Set up;Supervision/safety;Wash/dry hands;Wash/dry face;Brushing hair;Standing                   Toilet Transfer: Set up;Supervision/safety;Ambulation;Regular Toilet;Grab bars   Toileting- Clothing Manipulation and Hygiene: Supervision/safety;Set up;Sit to/from stand       Functional mobility during ADLs: Supervision/safety;Min guard(without an AD) General ADL Comments: Pt able to ambulate to/from bathroom with variable supervision to min guard to ensure balance and safety with lines. Noted 0 instances of LOB. Pt tolerated standing at the sink 1 x 5 min with supervision to complete grooming tasks.      Vision       Perception     Praxis      Cognition Arousal/Alertness: Awake/alert Behavior During Therapy: [facility]  for tasks assessed/performed Overall Cognitive Status: Within Functional Limits for tasks assessed                                 General Comments: Pt pleasant and willing to participate in therapy        Exercises     Shoulder Instructions       General Comments  No reports of SOB. HR increased to 147bpm during activity.     Pertinent Vitals/ Pain       Pain Assessment: No/denies pain  Home Living                                          Prior Functioning/Environment              Frequency  Min 3X/week        Progress Toward Goals  OT Goals(current goals can now be found in the care plan section)  Progress towards OT goals: Progressing toward goals  ADL Goals Pt Will Perform Lower Body Dressing: with supervision;sit to/from stand Pt Will Transfer to Toilet: with min guard assist;ambulating Pt Will Perform Toileting - Clothing Manipulation and hygiene: with min guard assist;sit to/from stand Pt/caregiver will Perform Home Exercise Program: Increased strength;Both right and left upper extremity;With Supervision  Plan Discharge plan needs to be updated;Frequency needs to be updated    Co-evaluation                 AM-PAC OT "6 Clicks" Daily Activity     Outcome Measure   Help from another person eating meals?: None Help from another person taking care of personal grooming?: A Little Help from another person toileting, which includes using toliet, bedpan, or urinal?: A Little Help from another person bathing (including washing, rinsing, drying)?: A Little Help from another person to put on and taking off regular upper body clothing?: A Little Help from another person to put on and taking off regular lower body clothing?: A Lot 6 Click Score: 18    End of Session    OT Visit Diagnosis: Unsteadiness on feet (R26.81);Muscle weakness (generalized) (M62.81)   Activity Tolerance Patient tolerated treatment well   Patient Left in chair;with call bell/phone within reach   Nurse Communication Mobility status        Time: 5992-3414 OT Time Calculation (min): 20 min  Charges: OT General Charges $OT Visit: 1 Visit OT Treatments $Self Care/Home Management : 8-22 mins  Mauri Brooklyn  OTR/L [phone]   Mauri Brooklyn 03/27/2019, 11:06 AM

## 2019-03-27 NOTE — CV Procedure (Signed)
Procedure: TEE  Indication: Atrial fibrillation  Sedation: Per anesthesiology  Findings: Please see echo section for full report.  Normal left ventricular size and systolic function, EF 71-69% with no wall motion abnormalities. The right ventricle was moderately dilated with mildly decreased systolic function.  D-shaped interventricular septum suggestive of RV pressure/volume overload.  Moderate left atrial enlargement, no LA appendage thrombus.  Moderate right atrial enlargement.  No PFO/ASD by color doppler.  There was moderate TR, peak RV-RA gradient 54 mmHg. The mitral valve was heavily calcified and restricted.  There was trivial mitral regurgitation.  When back in NSR, the mean gradient was 5 mmHg with MVA by PHT 2.2 cm^2, this suggests mild-moderate mitral stenosis. Trileaflet aortic valve with no significant stenosis or regurgitation.  Normal caliber thoracic aorta with grade 3 plaque.   May proceed to DCCV.   [name] 03/27/2019 2:13 PM

## 2019-03-27 NOTE — Plan of Care (Signed)

## 2019-03-27 NOTE — Progress Notes (Signed)
[facility]  PROGRESS NOTE  Ellen Jordan NLZ:[id number] DOB: 07-18-1945 DOA: 03/17/2019 PCP: [name], MD   Brief HPI:   74 year old female with medical history of COPD on 2 L of oxygen via nasal cannula, ILD, atrial fibrillation on Eliquis, diastolic CHF, pulmonary hypertension, diabetes mellitus type 2, CKD stage IIIb, hypertension, obesity [facility] with progressive shortness of breath and rectal bleeding.  She was admitted with acute on chronic renal failure and acute on chronic diastolic CHF.  Cardiology was consulted but they were unable to diurese due to worsening renal function.  She was transferred to [facility] for nephrology input. Nephrology, advanced heart failure team, PCCM, palliative care were consulted.  She was started on IV Lasix and dobutamine.  Patient underwent right heart catheterization-pulmonary venous hypertension thought to be secondary to mitral stenosis.  Serum creatinine remained stable at 1.61.  Right heart catheter revealed moderate mitral valve stenosis, deemed not a good candidate for surgery of valvoplasty due to poor functional status Remains in A. fib with RVR, plan for TEE cardioversion today.    Subjective   Patient seen and examined, awaiting for TEE/DCCV today.  Denies chest pain or shortness of breath   Assessment/Plan:     1. Acute on chronic diastolic CHF/RV failure/pulmonary hypertension-echocardiogram showed EF of 60 to 65%, patient underwent right heart cath on 03/25/2019.  Found to have pulmonary hypertension, she was started on dobutamine and IV Lasix.  IV Lasix has been changed to p.o. torsemide.  Dobutamine has been weaned off.  Advanced heart failure team following.  2. Mitral valve stenosis-patient underwent right heart catheterization, per cardiology patient is not a good candidate for valve replacement or valvuloplasty.  3. Atrial flutter with wide QRS-started on amiodarone drip, Eliquis for anticoagulation.   TEE/DCCV today.  4. Acute kidney injury on CKD stage IIIb-likely cardiorenal, AKI has improved.  Today creatinine is stable at 1.56.  5. Chronic COPD/respiratory failure-patient on 2 L/min oxygen via nasal cannula.  At baseline.  Continue Breo Ellipta, DuoNeb, supplemental oxygen.  6. Diabetes mellitus type 2-hemoglobin A1c 6.7, continue Levemir 20 units subcu twice daily, sliding scale insulin with NovoLog.   7. Hypokalemia-potassium being replaced.  Follow BMP in a.m.    SpO2: 95 % O2 Flow Rate (L/min): 3 L/min   COVID-19 Labs  No results for input(s): DDIMER, FERRITIN, LDH, CRP in the last 72 hours.  Lab Results  Component Value Date   SARSCOV2NAA NEGATIVE 03/18/2019   [place] NEGATIVE 12/14/2018   [place] NEGATIVE 11/02/2018   [place] NEGATIVE 07/26/2018     CBG: Recent Labs  Lab 03/26/19 1656 03/26/19 2114 03/27/19 0623 03/27/19 0802 03/27/19 1149  GLUCAP 220* 200* 114* 125* 110*    CBC: Recent Labs  Lab 03/23/19 0542 03/23/19 0542 03/24/19 0552 03/24/19 0552 03/25/19 0601 03/25/19 0737 03/25/19 0741 03/26/19 0410 03/27/19 0425  WBC 5.4  --  5.6  --  7.7  --   --  6.5 7.1  HGB 9.1*   < > 9.4*   < > 10.5* 10.9* 10.9* 10.2* 10.4*  HCT 27.6*   < > 29.9*   < > 32.6* 32.0* 32.0* 32.0* 33.3*  MCV 94.2  --  96.5  --  97.6  --   --  96.1 97.4  PLT 163  --  165  --  182  --   --  169 178   < > = values in this interval not displayed.    Basic Metabolic  Panel: Recent Labs  Lab 03/21/19 0630 03/21/19 1722 03/22/19 0439 03/22/19 0439 03/23/19 0542 03/23/19 0542 03/24/19 7681 03/24/19 0552 03/25/19 0601 03/25/19 0602 03/25/19 0737 03/25/19 0741 03/26/19 0410 03/27/19 0425  NA 126*   < > 130*   < > 137   < > 136   < >  --  136 139 139 135 139  K 4.6  --  4.0   < > 3.4*   < > 3.9   < >  --  3.8 3.8 3.7 3.6 3.3*  CL 93*  --  93*   < > 96*  --  94*  --   --  92*  --   --  93* 94*  CO2 20*  --  23   < > 29  --  31  --   --  30  --   --  30  31  GLUCOSE 180*  --  134*   < > 79  --  157*  --   --  110*  --   --  141* 123*  BUN 66*  --  59*   < > 49*  --  39*  --   --  31*  --   --  26* 28*  CREATININE 3.79*  --  2.55*   < > 1.88*  --  1.60*  --   --  1.61*  --   --  1.59* 1.56*  CALCIUM 8.9  --  9.7   < > 10.1  --  10.1  --   --  10.0  --   --  9.5 9.5  MG 1.8  --  1.8  --  1.6*  --  1.6*  --  1.8  --   --   --   --   --   PHOS 6.6*  --  5.5*  --  4.4  --  4.1  --   --  3.7  --   --  3.9  --    < > = values in this interval not displayed.     Liver Function Tests: Recent Labs  Lab 03/22/19 0439 03/23/19 0542 03/24/19 0552 03/25/19 0602 03/26/19 0410  ALBUMIN 3.2* 3.4* 3.3* 3.5 3.4*        DVT prophylaxis: Apixaban  Code Status: DNR  Family Communication: No family at bedside  Disposition Plan: Patient mated with acute on chronic diastolic CHF/RV failure/pulmonary hypertension, developed A. fib with RVR.  Plan for cardioversion today.  Responded to dobutamine and IV Lasix.  Barrier to discharge-we will await cardiology to optimize therapy before discharge.  Plan to go home with hospice.  Likely discharge in next 1 to 2 days.         Scheduled medications:  . acetaminophen  1,000 mg Oral Q8H  . apixaban  5 mg Oral BID  . atorvastatin  40 mg Oral Daily  . Chlorhexidine Gluconate Cloth  6 each Topical Daily  . darbepoetin (ARANESP) injection - NON-DIALYSIS  60 mcg Subcutaneous Q Wed-1800  . feeding supplement (ENSURE ENLIVE)  237 mL Oral BID BM  . fluticasone  2 spray Each Nare Daily  . fluticasone furoate-vilanterol  1 puff Inhalation Daily  . insulin aspart  0-9 Units Subcutaneous TID WC  . insulin detemir  20 Units Subcutaneous BID  . loratadine  10 mg Oral Daily  . mouth rinse  15 mL Mouth Rinse BID  . multivitamin with minerals  1 tablet Oral Daily  . mupirocin  ointment  1 application Nasal BID  . pantoprazole  40 mg Oral Daily  . potassium chloride  40 mEq Oral Once  . ramelteon  8 mg Oral QHS   . sertraline  100 mg Oral Daily  . sodium chloride flush  10-40 mL Intracatheter Q12H  . sodium chloride flush  3 mL Intravenous Q12H  . sodium chloride flush  3 mL Intravenous Q12H  . sodium chloride flush  3 mL Intravenous Q12H  . [START ON 03/28/2019] torsemide  80 mg Oral Daily    Consultants:  Cardiology  Nephrology  Palliative care  Procedures:    Antibiotics:   Anti-infectives (From admission, onward)   None       Objective   Vitals:   03/27/19 1420 03/27/19 1427 03/27/19 1434 03/27/19 1500  BP: (!) 127/52 (!) 129/58 (!) 149/66   Pulse:   84   Resp: 19  19   Temp: 98.2 F (36.8 C)   98 F (36.7 C)  TempSrc: Temporal   Oral  SpO2: 98% 97% 95%   Weight:      Height:        Intake/Output Summary (Last 24 hours) at 03/27/2019 1519 Last data filed at 03/27/2019 1410 Gross per 24 hour  Intake 560 ml  Output 2550 ml  Net -1990 ml    03/15 1901 - 03/17 0700 In: 1540 [P.O.:1540] Out: 4200 [Urine:4200]  Filed Weights   03/26/19 0040 03/27/19 0250 03/27/19 1317  Weight: 80.9 kg 78.9 kg 78.9 kg    Physical Examination:    General-appears in no acute distress  Heart-S1-S2, regular, no murmur auscultated  Lungs-clear to auscultation bilaterally, no wheezing or crackles auscultated  Abdomen-soft, nontender, no organomegaly  Extremities-no edema in the lower extremities  Neuro-alert, oriented x3, no focal deficit noted    Data Reviewed:   Recent Results (from the past 240 hour(s))  SARS CORONAVIRUS 2 (TAT 6-24 HRS) Nasopharyngeal Nasopharyngeal Swab     Status: None   Collection Time: 03/18/19 12:10 AM   Specimen: Nasopharyngeal Swab  Result Value Ref Range Status   SARS Coronavirus 2 NEGATIVE NEGATIVE Final    Comment: (NOTE) SARS-CoV-2 target nucleic acids are NOT DETECTED. The SARS-CoV-2 RNA is generally detectable in upper and lower respiratory specimens during the acute phase of infection. Negative results do not preclude  SARS-CoV-2 infection, do not rule out co-infections with other pathogens, and should not be used as the sole basis for treatment or other patient management decisions. Negative results must be combined with clinical observations, patient history, and epidemiological information. The expected result is Negative. Fact Sheet for Patients: SugarRoll.be Fact Sheet for Healthcare Providers: https://www.woods-mathews.com/ This test is not yet approved or cleared by the [place] FDA and  has been authorized for detection and/or diagnosis of SARS-CoV-2 by FDA under an Emergency Use Authorization (EUA). This EUA will remain  in effect (meaning this test can be used) for the duration of the COVID-19 declaration under Section 56 4(b)(1) of the Act, 21 U.S.C. section 360bbb-3(b)(1), unless the authorization is terminated or revoked sooner. Performed at [facility], [place] [address], [place], [place] [address]     No results for input(s): LIPASE, AMYLASE in the last 168 hours. No results for input(s): AMMONIA in the last 168 hours.  Cardiac Enzymes: No results for input(s): CKTOTAL, CKMB, CKMBINDEX, TROPONINI in the last 168 hours. BNP (last 3 results) Recent Labs    03/17/19 2213  BNP 338.0*    ProBNP (last  3 results) Recent Labs    09/12/18 1201  PROBNP 51.0    Studies:  No results found.   Admission status: The appropriate admission status for this patient is INPATIENT. Inpatient status is judged to be reasonable and necessary in order to provide the required intensity of service to ensure the patient's safety. The patient's presenting symptoms, physical exam findings, and initial radiographic and laboratory data in the context of their chronic comorbidities is felt to place them at high risk for further clinical deterioration. Furthermore, it is not anticipated that the patient will be medically stable for discharge from the  hospital within 2 midnights of admission. The following factors support the admission status of inpatient.     The patient's presenting symptoms include dyspnea on exertion The worrisome physical exam findings include hypoxia. The initial radiographic and laboratory data are worrisome because of CHF exacerbation. The chronic co-morbidities include COPD, ILD, CHF.       * I certify that at the point of admission it is my clinical judgment that the patient will require inpatient hospital care spanning beyond 2 midnights from the point of admission due to high intensity of service, high risk for further deterioration and high frequency of surveillance required.Oswald Hillock   [facility] If 7PM-7AM, please contact night-coverage at [email or web address], Office  [phone]   03/27/2019, 3:19 PM  LOS: 9 days

## 2019-03-28 LAB — CBC
HCT: 35.4 % — ABNORMAL LOW (ref 36.0–46.0)
Hemoglobin: 11.1 g/dL — ABNORMAL LOW (ref 12.0–15.0)
MCH: 30.8 pg (ref 26.0–34.0)
MCHC: 31.4 g/dL (ref 30.0–36.0)
MCV: 98.3 fL (ref 80.0–100.0)
Platelets: 184 10*3/uL (ref 150–400)
RBC: 3.6 MIL/uL — ABNORMAL LOW (ref 3.87–5.11)
RDW: 15.9 % — ABNORMAL HIGH (ref 11.5–15.5)
WBC: 6.6 10*3/uL (ref 4.0–10.5)
nRBC: 0 % (ref 0.0–0.2)

## 2019-03-28 LAB — BASIC METABOLIC PANEL
Anion gap: 17 — ABNORMAL HIGH (ref 5–15)
BUN: 28 mg/dL — ABNORMAL HIGH (ref 8–23)
CO2: 27 mmol/L (ref 22–32)
Calcium: 9.4 mg/dL (ref 8.9–10.3)
Chloride: 95 mmol/L — ABNORMAL LOW (ref 98–111)
Creatinine, Ser: 1.74 mg/dL — ABNORMAL HIGH (ref 0.44–1.00)
GFR calc Af Amer: 33 mL/min — ABNORMAL LOW (ref >60)
GFR calc non Af Amer: 28 mL/min — ABNORMAL LOW (ref >60)
Glucose, Bld: 159 mg/dL — ABNORMAL HIGH (ref 70–99)
Potassium: 3.7 mmol/L (ref 3.5–5.1)
Sodium: 139 mmol/L (ref 135–145)

## 2019-03-28 LAB — COOXEMETRY PANEL
Carboxyhemoglobin: 1.9 % — ABNORMAL HIGH (ref 0.5–1.5)
Methemoglobin: 1 % (ref 0.0–1.5)
O2 Saturation: 49.2 %
Total hemoglobin: 11.4 g/dL — ABNORMAL LOW (ref 12.0–16.0)

## 2019-03-28 LAB — GLUCOSE, CAPILLARY
Glucose-Capillary: 137 mg/dL — ABNORMAL HIGH (ref 70–99)
Glucose-Capillary: 253 mg/dL — ABNORMAL HIGH (ref 70–99)

## 2019-03-28 MED ORDER — TORSEMIDE 20 MG PO TABS: 80.0000 mg | ORAL_TABLET | Freq: Every day | ORAL | 2 refills | Status: DC

## 2019-03-28 MED ORDER — AMLODIPINE BESYLATE 5 MG PO TABS
5.0000 mg | ORAL_TABLET | Freq: Every day | ORAL | Status: DC
  Administered 2019-03-28: 5 mg via ORAL
  Filled 2019-03-28: qty 1

## 2019-03-28 MED ORDER — AMLODIPINE BESYLATE 5 MG PO TABS: 5.0000 mg | ORAL_TABLET | Freq: Every day | ORAL | 2 refills | Status: AC

## 2019-03-28 MED ORDER — AMIODARONE HCL 200 MG PO TABS: ORAL_TABLET | ORAL | 2 refills | Status: DC

## 2019-03-28 MED ORDER — POTASSIUM CHLORIDE ER 20 MEQ PO TBCR: 20.0000 meq | EXTENDED_RELEASE_TABLET | Freq: Every day | ORAL | 2 refills | Status: DC

## 2019-03-28 MED ORDER — AMIODARONE HCL 200 MG PO TABS
400.0000 mg | ORAL_TABLET | Freq: Two times a day (BID) | ORAL | Status: DC
  Administered 2019-03-28: 400 mg via ORAL
  Filled 2019-03-28: qty 2

## 2019-03-28 NOTE — TOC Progression Note (Signed)
Transition of Care [facility]) - Progression Note    Patient Details  Name: Ellen Jordan MRN: [id number] Date of Birth: 1945-11-08  Transition of Care [facility]) CM/SW [place], [place] Phone Number: 03/28/2019, 2:03 PM  Clinical Narrative:      CSW spoke with Dorian Pod from [place]. Confirmed with her that discharge summary/orders are in.  CSW spoke with patients husband Hedy Camara to let him know patient is ready for pick up. CSW called patients nurse to let her know patients husband is on his way to pick her up.    Expected Discharge Plan: Home w Hospice Care Barriers to Discharge: No Barriers Identified  Expected Discharge Plan and Services Expected Discharge Plan: [place] Choice: [place] arrangements for the past 2 months: Single Family Home Expected Discharge Date: 03/28/19                                     Social Determinants of Health (SDOH) Interventions    Readmission Risk Interventions Readmission Risk Prevention Plan 03/19/2019  Transportation Screening Complete  PCP or Specialist Appt within 3-5 Days Not Complete  HRI or [place] Complete  Social Work Consult for [place] Planning/Counseling Complete  Palliative Care Screening Not Complete  Medication Review Press photographer) Complete  Some recent data might be hidden

## 2019-03-28 NOTE — Progress Notes (Signed)
Husband has arrived to pick patient up. The IV team has removed picc line they told her she must wait 30 mins before she can leave after removal of picc line.

## 2019-03-28 NOTE — Progress Notes (Signed)
Patient ID: Ellen Jordan, female   DOB: 08-Jun-1945, 74 y.o.   MRN: [id number]     Advanced Heart Failure Rounding Note  PCP-Cardiologist: Rozann Lesches, MD   Subjective:    TEE-guided DCCV 3/17, patient back in NSR.  TEE showed EF 60-65%, moderately dilated RV with mildly decreased systolic function, mild-moderate mitral stenosis.  She is now off dobutamine, co-ox low at 49%.    Creatinine mildly higher today at 1.74. Now on po torsemide.   RHC Procedural Findings (on dobutamine 5 mcg/kg/min): Hemodynamics (mmHg) RA mean 9 RV 48/4 PA 54/24, mean 34 PCWP mean 18 LV 101/7 AO 95/51 Oxygen saturations: PA 64% AO 100% Cardiac Output (Fick) 4.89  Cardiac Index (Fick) 2.59 PVR 3.27 WU Mitral valve mean gradient 11.5 mmHg Mitral valve area 1.67 cm^2   Objective:   Weight Range: 77.9 kg Body mass index is 30.42 kg/m.   Vital Signs:   Temp:  [97.4 F (36.3 C)-99.3 F (37.4 C)] 97.9 F (36.6 C) (03/18 1124) Pulse Rate:  [84-109] 93 (03/18 1124) Resp:  [16-21] 18 (03/18 1124) BP: (123-174)/(52-98) 144/60 (03/18 1124) SpO2:  [92 %-98 %] 95 % (03/18 1124) Weight:  [77.9 kg-78.9 kg] 77.9 kg (03/18 0406) Last BM Date: 03/31/19  Weight change: Filed Weights   03/27/19 0250 03/27/19 1317 03/28/19 0406  Weight: 78.9 kg 78.9 kg 77.9 kg    Intake/Output:   Intake/Output Summary (Last 24 hours) at 03/28/2019 1159 Last data filed at 03/28/2019 1038 Gross per 24 hour  Intake 2376.08 ml  Output 1750 ml  Net 626.08 ml      Physical Exam   General: NAD Neck: No JVD, no thyromegaly or thyroid nodule.  Lungs: Clear to auscultation bilaterally with normal respiratory effort. CV: Nondisplaced PMI.  Heart regular S1/S2, no S3/S4, no murmur.  1+ ankle edema.   Abdomen: Soft, nontender, no hepatosplenomegaly, no distention.  Skin: Intact without lesions or rashes.  Neurologic: Alert and oriented x 3.  Psych: Normal affect. Extremities: No clubbing or cyanosis.  HEENT:  Normal.    Telemetry   NSR 70s-80s (personally reviewed)  EKG   n/a   Labs    CBC Recent Labs    03/27/19 0425 03/28/19 0526  WBC 7.1 6.6  HGB 10.4* 11.1*  HCT 33.3* 35.4*  MCV 97.4 98.3  PLT 178 440   Basic Metabolic Panel Recent Labs    03/26/19 0410 03/26/19 0410 03/27/19 0425 03/28/19 0526  NA 135   < > 139 139  K 3.6   < > 3.3* 3.7  CL 93*   < > 94* 95*  CO2 30   < > 31 27  GLUCOSE 141*   < > 123* 159*  BUN 26*   < > 28* 28*  CREATININE 1.59*   < > 1.56* 1.74*  CALCIUM 9.5   < > 9.5 9.4  PHOS 3.9  --   --   --    < > = values in this interval not displayed.   Liver Function Tests Recent Labs    03/26/19 0410  ALBUMIN 3.4*   No results for input(s): LIPASE, AMYLASE in the last 72 hours. Cardiac Enzymes No results for input(s): CKTOTAL, CKMB, CKMBINDEX, TROPONINI in the last 72 hours.  BNP: BNP (last 3 results) Recent Labs    03/17/19 2213  BNP 338.0*    ProBNP (last 3 results) Recent Labs    09/12/18 1201  PROBNP 51.0     D-Dimer No results for input(s):  DDIMER in the last 72 hours. Hemoglobin A1C No results for input(s): HGBA1C in the last 72 hours. Fasting Lipid Panel No results for input(s): CHOL, HDL, LDLCALC, TRIG, CHOLHDL, LDLDIRECT in the last 72 hours. Thyroid Function Tests No results for input(s): TSH, T4TOTAL, T3FREE, THYROIDAB in the last 72 hours.  Invalid input(s): FREET3  Other results:   Imaging    No results found.   Medications:     Scheduled Medications: . acetaminophen  1,000 mg Oral Q8H  . apixaban  5 mg Oral BID  . atorvastatin  40 mg Oral Daily  . Chlorhexidine Gluconate Cloth  6 each Topical Daily  . darbepoetin (ARANESP) injection - NON-DIALYSIS  60 mcg Subcutaneous Q Wed-1800  . feeding supplement (ENSURE ENLIVE)  237 mL Oral BID BM  . fluticasone  2 spray Each Nare Daily  . fluticasone furoate-vilanterol  1 puff Inhalation Daily  . insulin aspart  0-9 Units Subcutaneous TID WC  . insulin  detemir  20 Units Subcutaneous BID  . loratadine  10 mg Oral Daily  . mouth rinse  15 mL Mouth Rinse BID  . multivitamin with minerals  1 tablet Oral Daily  . mupirocin ointment  1 application Nasal BID  . pantoprazole  40 mg Oral Daily  . ramelteon  8 mg Oral QHS  . sertraline  100 mg Oral Daily  . sodium chloride flush  10-40 mL Intracatheter Q12H  . sodium chloride flush  3 mL Intravenous Q12H  . sodium chloride flush  3 mL Intravenous Q12H  . sodium chloride flush  3 mL Intravenous Q12H  . torsemide  80 mg Oral Daily    Infusions: . sodium chloride    . sodium chloride 250 mL (03/24/19 0700)  . sodium chloride    . amiodarone 30 mg/hr (03/28/19 0233)    PRN Medications: sodium chloride, sodium chloride, sodium chloride, acetaminophen, albuterol, ALPRAZolam, camphor-menthol, ipratropium-albuterol, liver oil-zinc oxide, loperamide, ondansetron (ZOFRAN) IV, sodium chloride flush, sodium chloride flush, sodium chloride flush   Assessment/Plan   1. RV failure: Echo this admission shows LV EF 65-70% with RV moderate-severely dilated and moderate dysfunction, D-shaped septum, the mitral valve is severely calcified though mean gradient is only about 5 mmHg suggesting mild to moderate mitral stenosis (pressure half-time does not appear short), mild AS.  The patient was admitted markedly volume overloaded.  I am concerned that she has end-stage RV dysfunction.  She also has mitral stenosis which visually looks significant but measures only mild-moderate by mean gradient and pressure half-time on echo, this was confirmed by TEE (mild-moderate MS). She has been oliguric with creatinine up to 4.27 => down to 1.56 today, initially on dobutamine, now off. RHC/LHC this admit showed mildly elevated PCWP with moderate primarily pulmonary venous hypertension. Mitral stenosis appeared moderate by cath but only mild-moderate by TEE and echo.  Volume status looks optimized today.  Co-ox 49% but will accept  this to keep her off dobutamine.   - Continue torsemide 80 mg daily for now.  - May add digoxin for RV in future if creatinine remains stable.   - Suspect prognosis overall will be poor with significant RV failure.   2. Pulmonary hypertension: PASP estimated at 41 mmHg on echo this admission and 42 mmHg on echo in 2/20.  However, there has been suspicion for severe pulmonary hypertension based on dilated and dysfunctional RV.  There was concern for group 3 PH but recent high resolution chest CT in 2/21 does not show marked  lung abnormality (there does not seem to be severe emphysema or interstitial lung disease). RHC/LHC 3/15 suggests primarily pulmonary venous hypertension due to diastolic dysfunction and ?significant mitral stenosis (mild-moderate by TEE/TTE, moderate by RHC/LHC).  - I do not think that she would be a candidate for selective pulmonary vasodilators.  3. Mitral stenosis: The valve looks severely calcified and restricted, but hemodynamics by echo were not that impressive.  Mean gradient 5 mmHg and MVA 2.66 cm^2 by PHT suggested no more than mild mitral stenosis.  However, cath was suggestive of moderate mitral stenosis with mean gradient 11.5 mmHg and MVA 1.67 cm^2.  TEE was done 3/17, also looks like mild-moderate MS with mean gradient 5 mmHg and MVA 2.2 cm^2.  - She does not appear to be a valvuloplasty candidate due to her degree of calcification, and she would be a poor surgical candidate (poor functional status).  4. Atrial flutter: The patient appears to have been in atrial flutter since at least 12/20.  Mild RVR. TEE-guided DCCV 3/17, now in NSR.  - Transition amiodarone to 400 mg po bid.  - Continue apixaban  5. Acute on CKD stage 3: Creatinine peaked at 4.3. Today's creatinine is 1.74. Suspect cardiorenal syndrome in setting of RV failure.  She would be a poor candidate for long-term HD with RV failure.  - follow BMET w/ diuretics  6. Chronic hypoxemic respiratory failure:  She  has a history of smoking, but high resolution CT lungs does not show clear evidence of severe COPD or of interstitial lung disease.   7. Hyponatremia: Sodium up with tolvaptan dose on 3/11.  Fluid restrict.   8. HTN: BP running high. Will add amlodipine 5 mg daily.   She says she is going home today.  We will arrange close followup in CHF clinic.  Cardiac meds for discharge: Apixaban 5 mg bid, amiodarone 400 mg bid x 5 days then 200 mg bid x 10 days then 200 mg daily after that, torsemide 80 mg daily, KCl 20 daily, atorvastatin 40 daily, amlodipine 5 mg daily.    Length of Stay: [place], MD  03/28/2019, 11:59 AM  Advanced Heart Failure Team Pager [phone] (M-F; [place])  Please contact [facility] for night-coverage after hours (4p -7a ) and weekends on [email or web address]

## 2019-03-28 NOTE — Plan of Care (Signed)
  Problem: Education: Goal: Knowledge of General Education information will improve Description: Including pain rating scale, medication(s)/side effects and non-pharmacologic comfort measures 03/28/2019 1421 by Arlina Robes, RN Outcome: Adequate for Discharge 03/28/2019 0757 by Arlina Robes, RN Outcome: Progressing

## 2019-03-28 NOTE — TOC Progression Note (Signed)
Transition of Care [facility]) - Progression Note    Patient Details  Name: Ellen Jordan MRN: [id number] Date of Birth: 1945-03-07  Transition of Care [facility]) CM/SW [place], [place] Phone Number: 03/28/2019, 12:24 PM  Clinical Narrative:     CSW spoke with patient at bedside, she had questions/preferences about home oxygen, CSW informed her [facility] will be setting up. CSW called Dorian Pod with [facility] who reports they have a RN at [place] coming to the home and will address any questions as they are able to set patient up with all equipment.   Expected Discharge Plan: Home w Hospice Care Barriers to Discharge: Continued Medical Work up  Expected Discharge Plan and Services Expected Discharge Plan: [place] Choice: [place] arrangements for the past 2 months: Single Family Home                                       Social Determinants of Health (SDOH) Interventions    Readmission Risk Interventions Readmission Risk Prevention Plan 03/19/2019  Transportation Screening Complete  PCP or Specialist Appt within 3-5 Days Not Complete  HRI or [place] Complete  Social Work Consult for [place] Planning/Counseling Complete  Palliative Care Screening Not Complete  Medication Review Press photographer) Complete  Some recent data might be hidden

## 2019-03-28 NOTE — Progress Notes (Signed)
Daily Progress Note   Patient Name: Ellen Jordan       Date: 03/28/2019 DOB: 05/20/1945  Age: 74 y.o. MRN#: [id number] Attending Physician: Oswald Hillock, MD Primary Care Physician: [name], MD Admit Date: 03/17/2019  Reason for Consultation/Follow-up: Establishing goals of care  Subjective: Met with Ellen Jordan, she was sitting up in chair, enjoying her lunch. She is so glad to be going home today and notes how much better she feels.  I let her know of her daughter in laws concerns that were voiced yesterday- mainly that she wanted to ensure that Ellen Jordan truly understood hospice philosophy, that care was typically recommended when life expectancy was likely limited, and a complete understanding of the services provided.  Ellen Jordan stated she understood what hospice was- she was just so grateful because she did not realize that Hospice could come to her home. She and Ellen Jordan understand there will not be 24 hour care.  When asked about her goals and her preferences if she were to go home and decompensate again- she replied that she would prefer to be kept at home and comfortable. I then asked her- "even if this meant you might die sooner?" and she said "yes". She shared with me that she just wants to enjoy her time feeling well for now. Her husband spoke with a few family members in attempts to amend a rift in the family where they had not spoken in 50 years- she shared that he asked them if their mother was in a coffin if they would regret not having spoken to one another. I believe this evidences that patient is realistic about her prognosis and clear in her goals of care.   ROS  Length of Stay: 10  Current Medications: Scheduled Meds:  . acetaminophen  1,000 mg Oral Q8H  . amiodarone  400 mg Oral BID    . amLODipine  5 mg Oral Daily  . apixaban  5 mg Oral BID  . atorvastatin  40 mg Oral Daily  . Chlorhexidine Gluconate Cloth  6 each Topical Daily  . darbepoetin (ARANESP) injection - NON-DIALYSIS  60 mcg Subcutaneous Q Wed-1800  . feeding supplement (ENSURE ENLIVE)  237 mL Oral BID BM  . fluticasone  2 spray Each Nare Daily  . fluticasone furoate-vilanterol  1 puff Inhalation Daily  .  insulin aspart  0-9 Units Subcutaneous TID WC  . insulin detemir  20 Units Subcutaneous BID  . loratadine  10 mg Oral Daily  . mouth rinse  15 mL Mouth Rinse BID  . multivitamin with minerals  1 tablet Oral Daily  . mupirocin ointment  1 application Nasal BID  . pantoprazole  40 mg Oral Daily  . ramelteon  8 mg Oral QHS  . sertraline  100 mg Oral Daily  . sodium chloride flush  10-40 mL Intracatheter Q12H  . sodium chloride flush  3 mL Intravenous Q12H  . sodium chloride flush  3 mL Intravenous Q12H  . sodium chloride flush  3 mL Intravenous Q12H  . torsemide  80 mg Oral Daily    Continuous Infusions: . sodium chloride    . sodium chloride 250 mL (03/24/19 0700)  . sodium chloride      PRN Meds: sodium chloride, sodium chloride, sodium chloride, acetaminophen, albuterol, ALPRAZolam, camphor-menthol, ipratropium-albuterol, liver oil-zinc oxide, loperamide, ondansetron (ZOFRAN) IV, sodium chloride flush, sodium chloride flush, sodium chloride flush  Physical Exam Vitals and nursing note reviewed.  Constitutional:      General: She is not in acute distress.    Appearance: Normal appearance.  Cardiovascular:     Rate and Rhythm: Normal rate and regular rhythm.  Pulmonary:     Effort: Pulmonary effort is normal.  Neurological:     Mental Status: She is alert and oriented to person, place, and time.  Psychiatric:        Mood and Affect: Mood normal.        Behavior: Behavior normal.        Thought Content: Thought content normal.        Judgment: Judgment normal.             Vital Signs:  BP (!) 144/60   Pulse 93   Temp 97.9 F (36.6 C) (Oral)   Resp 18   Ht 5' 3" (1.6 m)   Wt 77.9 kg   SpO2 95%   BMI 30.42 kg/m  SpO2: SpO2: 95 % O2 Device: O2 Device: Room Air O2 Flow Rate: O2 Flow Rate (L/min): 3 L/min  Intake/output summary:   Intake/Output Summary (Last 24 hours) at 03/28/2019 1257 Last data filed at 03/28/2019 1217 Gross per 24 hour  Intake 2376.08 ml  Output 2350 ml  Net 26.08 ml   LBM: Last BM Date: 03/31/19 Baseline Weight: Weight: 79.4 kg Most recent weight: Weight: 77.9 kg       Palliative Assessment/Data:    Flowsheet Rows     Most Recent Value  Intake Tab  Referral Department  Hospitalist  Unit at Time of Referral  Cardiac/Telemetry Unit  Palliative Care Primary Diagnosis  Cardiac  Date Notified  03/21/19  Palliative Care Type  New Palliative care  Reason for referral  Clarify Goals of Care  Date of Admission  03/17/19  Date first seen by Palliative Care  03/21/19  # of days Palliative referral response time  0 Day(s)  # of days IP prior to Palliative referral  4  Clinical Assessment  Palliative Performance Scale Score  50%  Psychosocial & Spiritual Assessment  Palliative Care Outcomes  Patient/Family meeting held?  Yes  Who was at the meeting?  patient  Palliative Care Outcomes  Clarified goals of care, Provided end of life care assistance, Provided psychosocial or spiritual support, ACP counseling assistance      Patient Active Problem List   Diagnosis Date Noted  .  Advanced care planning/counseling discussion   . Palliative care by specialist   . Goals of care, counseling/discussion   . Chronic atrial fibrillation ([place])   . Acute respiratory failure with hypoxemia ([place])   . RVF (right ventricular failure) ([place])   . AKI (acute kidney injury) ([place]) 03/18/2019  . Uncontrolled type 2 diabetes mellitus with hyperglycemia ([place]) 02/19/2019  . Atrial fibrillation with RVR ([place]) 12/18/2018  . Hypokalemia 12/18/2018  . Normocytic  anemia 12/18/2018  . Mediastinal lymphadenopathy 12/18/2018  . Pulmonary nodules 12/18/2018  . Pulmonary hypertension ([place]) 09/08/2018  . History of tobacco abuse 11/09/2016  . Controlled diabetes mellitus type 2 with complications ([place]) 23/76/2831  . Restless leg syndrome 11/04/2016  . PAF (paroxysmal atrial fibrillation) ([place]) 06/03/2016  . RBBB 06/03/2016  . SOB (shortness of breath)   . Atrial fibrillation with rapid ventricular response ([place]) 02/28/2015  . Uncontrolled type 2 diabetes mellitus without complication, with long-term current use of insulin 12/24/2014  . Mixed hyperlipidemia 12/24/2014  . Essential hypertension, benign 12/24/2014    Palliative Care Assessment & Plan   Patient Profile: 74 y.o.femalewith past medical history of COPD on 2L oxygen, afib on Eliquis, diastolic CHF, pulmonary hypertension, CKDadmitted on3/7/2021with recurrent falls and weakness.Hospital admission for acute on chronic diastolic CHF/RV failure with pulmonary hypertension and mitral valve stenosis. Heart failure team following. Patient receiving IV dobutamine, IV lasix, and metolazone. AKI on CKD stage 3, creat on admit 4.37. Nephrology following. Possibly cardiorenal. Patient is not a candidate for HD with RV failure. Tentative RHC this admit. Palliative medicine consultation for goals of care.  Assessment/Recommendations/Plan   End stage CHF, stabilized for now, but likely to decompensate again in the future- plan for home with hospice  Goals of Care and Additional Recommendations:  Limitations on Scope of Treatment: Avoid Hospitalization  Code Status:  DNR  Prognosis:   < 6 months  Discharge Planning:  Home with Hospice  Care plan was discussed with patient.  Thank you for allowing the Palliative Medicine Team to assist in the care of this patient.   Time In: 1155 Time Out: 1230 Total Time 35 minutes Prolonged Time Billed no      Greater than 50%  of this time was  spent counseling and coordinating care related to the above assessment and plan.  Mariana Kaufman, AGNP-C Palliative Medicine   Please contact Palliative Medicine Team phone at [phone] for questions and concerns.

## 2019-03-28 NOTE — Progress Notes (Signed)
Discharge instructions given to patient  She verbalized understanding of instructions given. She has orders for IV team to remove PICC line patient aware. She is getting dressed with CNA  @ present. The husband has been called to come pick patient up he is on his way. She has discharge arrangements made for Hospice at home. I will send her yellow DNR order for her at home for home health. No further changes noted.

## 2019-03-28 NOTE — Plan of Care (Signed)

## 2019-03-28 NOTE — Plan of Care (Signed)

## 2019-03-28 NOTE — Discharge Summary (Signed)
Physician Discharge Summary  VERNEICE CASPERS SNK:[id number] DOB: Sep 04, 1945 DOA: 03/17/2019  PCP: [name], MD  Admit date: 03/17/2019 Discharge date: 03/28/2019  Time spent: 50 minutes  Recommendations for Outpatient Follow-up:  1. Patient was discharged to  home with hospice   Discharge Diagnoses:  Active Problems:   SOB (shortness of breath)   AKI (acute kidney injury) ([place])   Acute respiratory failure with hypoxemia (HCC)   RVF (right ventricular failure) ([place])   Palliative care by specialist   Goals of care, counseling/discussion   Chronic atrial fibrillation [facility])   Advanced care planning/counseling discussion   Discharge Condition: Stable  Diet recommendation: Heart healthy diet  Filed Weights   03/27/19 0250 03/27/19 1317 03/28/19 0406  Weight: 78.9 kg 78.9 kg 77.9 kg    History of present illness:  74 year old female with medical history of COPD on 2 L of oxygen via nasal cannula, ILD, atrial fibrillation on Eliquis, diastolic CHF, pulmonary hypertension, diabetes mellitus type 2, CKD stage IIIb, hypertension, obesity [facility] with progressive shortness of breath and rectal bleeding.  She was admitted with acute on chronic renal failure and acute on chronic diastolic CHF.  Cardiology was consulted but they were unable to diurese due to worsening renal function.  She was transferred to [facility] for nephrology input. Nephrology, advanced heart failure team, PCCM, palliative care were consulted.  She was started on IV Lasix and dobutamine.  Patient underwent right heart catheterization-pulmonary venous hypertension thought to be secondary to mitral stenosis.  Serum creatinine remained stable at 1.61.  Right heart catheter revealed moderate mitral valve stenosis, deemed not a good candidate for surgery of valvoplasty due to poor functional status   Hospital Course:   1. Acute on chronic diastolic CHF/RV failure/pulmonary hypertension-echocardiogram  showed EF of 60 to 65%, patient underwent right heart cath on 03/25/2019.  Found to have pulmonary hypertension, she was started on dobutamine and IV Lasix.  IV Lasix has been changed to p.o. torsemide.  Dobutamine has been weaned off.  Advanced heart failure team has adjusted her medications.  She will be discharged on torsemide 80 mg p.o. daily.  2. Mitral valve stenosis-patient underwent right heart catheterization, per cardiology patient is not a good candidate for valve replacement or valvuloplasty.  3. Atrial flutter with wide QRS-started on amiodarone drip, Eliquis for anticoagulation.    Patient underwent TEE/DCCV yesterday.  Currently on amiodarone 400 mg p.o. twice daily for 5 days then 200 mg p.o. twice daily for 10 days then 200 mg p.o. daily.  Continue Eliquis 5 mg p.o. twice daily  4. Acute kidney injury on CKD stage IIIb-likely cardiorenal, AKI has improved.  Today creatinine is stable at 1.56.  5. Chronic COPD/respiratory failure-patient on 2 L/min oxygen via nasal cannula.  At baseline.  Continue Breo Ellipta, DuoNeb, supplemental oxygen.  6. Diabetes mellitus type 2-hemoglobin A1c 6.7, continue Levemir 20 units subcu twice daily, sliding scale insulin with NovoLog.   Patient will be discharged home with hospice.   Procedures:  TEE/DCCV  Consultations:  Cardiology  Discharge Exam: Vitals:   03/28/19 0717 03/28/19 1124  BP: (!) 141/74 (!) 144/60  Pulse:  93  Resp: 16 18  Temp: 98.3 F (36.8 C) 97.9 F (36.6 C)  SpO2: 92% 95%    General-appears in no acute distress Heart-S1-S2, regular, no murmur auscultated Lungs-clear to auscultation bilaterally, no wheezing or crackles auscultated Abdomen-soft, nontender, no organomegaly Extremities-no edema in the lower extremities Neuro-alert, oriented x3, no  focal deficit noted  Discharge Instructions   Discharge Instructions    Diet - low sodium heart healthy   Complete by: As directed    Increase activity  slowly   Complete by: As directed      Allergies as of 03/28/2019      Reactions   Penicillins Hives   Has patient had a PCN reaction causing immediate rash, facial/tongue/throat swelling, SOB or lightheadedness with hypotension: Yes Has patient had a PCN reaction causing severe rash involving mucus membranes or skin necrosis: No Has patient had a PCN reaction that required hospitalization No Has patient had a PCN reaction occurring within the last 10 years: No If all of the above answers are "NO", then may proceed with Cephalosporin use.      Medication List    STOP taking these medications   atenolol 25 MG tablet Commonly known as: TENORMIN   diltiazem 180 MG 24 hr capsule Commonly known as: CARDIZEM CD   flecainide 100 MG tablet Commonly known as: TAMBOCOR   lisinopril 20 MG tablet Commonly known as: ZESTRIL     TAKE these medications   Accu-Chek FastClix Lancets Misc USE TO TEST BLOOD GLUCOSE FOUR TIMES DAILY What changed: See the new instructions.   Accu-Chek Guide w/Device Kit 1 each by Does not apply route 4 (four) times daily.   Accu-Chek SmartView test strip Generic drug: glucose blood CHECK BLOOD GLUCOSE FOUR TIMES DAILY What changed: See the new instructions.   glucose blood test strip Commonly known as: Accu-Chek Guide Use as instructed 4 x daily What changed: Another medication with the same name was changed. Make sure you understand how and when to take each.   Accu-Chek Guide test strip Generic drug: glucose blood USE AS INSTRUCTED FOUR TIMES DAILY What changed: See the new instructions.   albuterol 108 (90 Base) MCG/ACT inhaler Commonly known as: VENTOLIN HFA Inhale 2 puffs into the lungs every 6 (six) hours as needed for wheezing or shortness of breath.   amiodarone 200 MG tablet Commonly known as: Pacerone Take 400 mg po twice daily x 5 days then   take 200 mg po twice daily x 10 days then Take 200 mg po daily   amLODipine 5 MG  tablet Commonly known as: NORVASC Take 1 tablet (5 mg total) by mouth daily. Start taking on: March 29, 2019   atorvastatin 40 MG tablet Commonly known as: LIPITOR TAKE 1 TABLET EVERY DAY   b complex vitamins tablet Take 1 tablet by mouth daily.   B-D SINGLE USE SWABS REGULAR Pads USE FOUR TIMES DAILY   Biotin 5000 MCG Caps Take 1 capsule by mouth daily.   Breo Ellipta 100-25 MCG/INH Aepb Generic drug: fluticasone furoate-vilanterol Inhale 1 puff into the lungs daily. What changed: when to take this   cholecalciferol 1000 units tablet Commonly known as: VITAMIN D Take 1,000 Units by mouth daily.   diphenhydrAMINE 25 MG tablet Commonly known as: BENADRYL Take 25 mg by mouth daily as needed for allergies.   Eliquis 5 MG Tabs tablet Generic drug: apixaban TAKE 1 TABLET TWICE DAILY What changed: how much to take   EQ Chlortabs 4 MG tablet Generic drug: chlorpheniramine Take 4 mg by mouth 2 (two) times daily as needed for allergies.   Estroven + Energy Max Strength Tabs Take 1 tablet by mouth daily.   FreeStyle Libre 14 Day Sensor Misc Inject 1 each into the skin every 14 (fourteen) days. Use as directed.   insulin aspart  100 UNIT/ML injection Commonly known as: novoLOG Inject 10-16 Units into the skin 3 (three) times daily with meals.   insulin detemir 100 UNIT/ML injection Commonly known as: LEVEMIR Inject 0.5 mLs (50 Units total) into the skin at bedtime.   ipratropium-albuterol 0.5-2.5 (3) MG/3ML Soln Commonly known as: DUONEB Take 3 mLs by nebulization every 6 (six) hours as needed (wheezing/ shortness of breath).   multivitamin tablet Take 1 tablet by mouth daily.   omeprazole 20 MG capsule Commonly known as: PRILOSEC TAKE 1 CAPSULE EVERY DAY   Potassium Chloride ER 20 MEQ Tbcr Take 20 mEq by mouth daily.   sertraline 100 MG tablet Commonly known as: ZOLOFT TAKE 1 TABLET (100 MG TOTAL) BY MOUTH DAILY.   torsemide 20 MG tablet Commonly known  as: DEMADEX Take 4 tablets (80 mg total) by mouth daily. Start taking on: March 29, 2019 What changed:   how much to take  when to take this  reasons to take this   Trelegy Ellipta 100-62.5-25 MCG/INH Aepb Generic drug: Fluticasone-Umeclidin-Vilant Inhale 1 puff into the lungs every other day.   Victoza 18 MG/3ML Sopn Generic drug: liraglutide Inject 0.3 mLs (1.8 mg total) into the skin daily.   vitamin E 180 MG (400 UNITS) capsule Generic drug: vitamin E Take 400 Units by mouth daily.      Allergies  Allergen Reactions  . Penicillins Hives    Has patient had a PCN reaction causing immediate rash, facial/tongue/throat swelling, SOB or lightheadedness with hypotension: Yes Has patient had a PCN reaction causing severe rash involving mucus membranes or skin necrosis: No Has patient had a PCN reaction that required hospitalization No Has patient had a PCN reaction occurring within the last 10 years: No If all of the above answers are "NO", then may proceed with Cephalosporin use.       The results of significant diagnostics from this hospitalization (including imaging, microbiology, ancillary and laboratory) are listed below for reference.    Significant Diagnostic Studies: CARDIAC CATHETERIZATION  Result Date: 03/25/2019 1. Mildly elevated PCWP. 2. Primarily pulmonary venous hypertension with PVR 3.27 mmHg. 3. Mitral stenosis appears moderate on this study with the caveat that HR was in the 100s. Patient has moderate pulmonary hypertension that appears to be predominantly pulmonary venous hypertension.  I am concerned that her main problem could be mitral stenosis.  By cath today, it is moderate, though high rate in atrial fibrillation may worsen (HR 100s).   US RENAL  Result Date: 03/20/2019 CLINICAL DATA:  Acute kidney injury EXAM: RENAL / URINARY TRACT ULTRASOUND COMPLETE COMPARISON:  None. FINDINGS: Right Kidney: Renal measurements: 12.5 x 4.9 x 6.5 cm = volume: 209  mL. Cortical thinning. Normal echotexture. No mass or hydronephrosis. Left Kidney: Renal measurements: 12.0 x 5.6 x 6.5 cm = volume: 228 mL. Cortical thinning. Normal echotexture. No mass or hydronephrosis. Bladder: Not visualized, decompressed Other: None. IMPRESSION: Cortical thinning bilaterally. No acute findings. No hydronephrosis. Electronically Signed   By: Rolm Baptise M.D.   On: 03/20/2019 10:12   CT CHEST HIGH RESOLUTION  Result Date: 03/02/2019 CLINICAL DATA:  Chronic respiratory failure with hypoxia. Follow-up mediastinal adenopathy. History of uterine cancer. EXAM: CT CHEST WITHOUT CONTRAST TECHNIQUE: Multidetector CT imaging of the chest was performed following the standard protocol without intravenous contrast. High resolution imaging of the lungs, as well as inspiratory and expiratory imaging, was performed. COMPARISON:  12/04/2018 chest CT. FINDINGS: Cardiovascular: Borderline mild cardiomegaly. No significant pericardial effusion/thickening. Three-vessel coronary atherosclerosis. Atherosclerotic nonaneurysmal  thoracic aorta. Normal caliber pulmonary arteries. Mediastinum/Nodes: No discrete thyroid nodules. Unremarkable esophagus. No axillary adenopathy. Right paratracheal adenopathy up to 2.3 cm short axis diameter (series 4/image 64), not appreciably changed. Left prevascular adenopathy up to 1.2 cm (series 4/image 62), stable. Stable enlarged subcarinal nodes up to 1.5 cm. Qualitatively stable mild hilar adenopathy bilaterally, poorly delineated on these noncontrast images. Lungs/Pleura: No pneumothorax. Trace dependent right pleural effusion. No left pleural effusion. Interlobular septal thickening throughout both lungs, most prominent in the lower lungs, unchanged. Several scattered solid pulmonary nodules in both lungs, largest 8 mm in the medial apical right upper lobe (series 6/image 30), unchanged since 07/20/2018 chest CT. No new significant pulmonary nodules. No acute consolidative  airspace disease or lung masses. Thick bandlike opacities in the medial right middle lobe and medial lingula with associated mild volume loss and distortion are unchanged. Mild mosaic attenuation in both lungs, which appears to represent mild air trapping on limited motion degraded expiration sequence, with no evidence of tracheobronchomalacia. No significant regions of traction bronchiectasis or frank honeycombing. Upper abdomen: Small hiatal hernia. Cholecystectomy. Mildly irregular liver surface, compatible with cirrhosis. Trace perihepatic ascites. Musculoskeletal: No aggressive appearing focal osseous lesions. Marked thoracic spondylosis. IMPRESSION: 1. Borderline mild cardiomegaly. Trace dependent right pleural effusion. Interlobular septal thickening throughout both lungs, most prominent in the lower lungs, unchanged. Findings may indicate a component of mild congestive heart failure with mild pulmonary edema. 2. Mild mosaic attenuation in both lungs, which appears to represent mild air trapping due to small airways disease. No compelling findings of interstitial lung disease. Thick parenchymal bands in the mid lungs suggest nonspecific postinfectious/postinflammatory scarring or atelectasis. 3. Scattered solid pulmonary nodules, largest 8 mm in the apical right upper lobe, for which 7 month stability has been demonstrated, more likely benign. Suggest follow-up chest CT in 12 months. 4. Moderate mediastinal and mild bilateral hilar adenopathy is stable and more suggestive of benign etiology, with sarcoidosis not excluded. 5. Three-vessel coronary atherosclerosis. 6. Cirrhosis. Trace perihepatic ascites. 7. Small hiatal hernia. 8. Aortic Atherosclerosis (ICD10-I70.0). Electronically Signed   By: Ilona Sorrel M.D.   On: 03/02/2019 05:09   DG CHEST PORT 1 VIEW  Result Date: 03/20/2019 CLINICAL DATA:  PICC line placement EXAM: PORTABLE CHEST 1 VIEW COMPARISON:  March 20, 2019 FINDINGS: The PICC line  terminates in the region of the SVC. The heart size remains enlarged. There are small bilateral pleural effusions with adjacent airspace disease favored to represent atelectasis. There is no pneumothorax. No acute osseous abnormality. Aortic calcifications are noted. IMPRESSION: 1. Right-sided PICC line as above. 2. Again noted are findings suggestive of congestive heart failure. Electronically Signed   By: Constance Holster M.D.   On: 03/20/2019 20:15   DG Chest Port 1 View  Result Date: 03/20/2019 CLINICAL DATA:  Shortness of breath. EXAM: PORTABLE CHEST 1 VIEW COMPARISON:  Ultrasound 03/17/2019. FINDINGS: Cardiomegaly with bilateral pulmonary interstitial prominence and bilateral costophrenic angle blunting. Findings suggest congestive heart failure with interstitial edema and small bilateral pleural effusions. Pneumonitis cannot be excluded. Known pneumothorax. IMPRESSION: Findings suggest congestive heart failure bilateral interstitial edema small bilateral pleural effusions. Electronically Signed   By: Marcello Moores  Register   On: 03/20/2019 14:49   DG Chest Port 1 View  Result Date: 03/17/2019 CLINICAL DATA:  74 year old female with shortness of breath. EXAM: PORTABLE CHEST 1 VIEW COMPARISON:  Chest CT dated 03/01/2019. FINDINGS: There is background of chronic interstitial coarsening. Mild hazy airspace density primarily involving the mid to lower  lung field likely combination of vascular congestion and areas of air trapping or small airway disease. Pneumonia is less likely but not excluded. Clinical correlation is recommended. No lobar consolidation, pleural effusion, or pneumothorax. There is mild cardiomegaly with mild vascular congestion. Atherosclerotic calcification of the aorta. No acute osseous pathology. IMPRESSION: 1. Mild cardiomegaly with mild vascular congestion. 2. Probable combination of air trapping and small airway disease. Pneumonia is less likely but not excluded. Electronically Signed    By: Anner Crete M.D.   On: 03/17/2019 22:09   ECHOCARDIOGRAM COMPLETE  Result Date: 03/19/2019    ECHOCARDIOGRAM REPORT   Patient Name:   LATORYA BAUTCH Date of Exam: 03/19/2019 Medical Rec #:  [id number]     Height:       63.0 in Accession #:    [id number]    Weight:       175.0 lb Date of Birth:  [birth year]    BSA:          1.827 m Patient Age:    74 years      BP:           90/61 mmHg Patient Gender: F             HR:           92 bpm. Exam Location:  [facility] Procedure: 2D Echo Indications:    Atrial Flutter 427.32 / I48.92  History:        Patient has prior history of Echocardiogram examinations, most                 recent 02/23/2018. Arrythmias:Atrial Fibrillation and RBBB; Risk                 Factors:Hypertension and Diabetes. Acute kidney injury.  Sonographer:    Vikki Ports Turrentine Referring Phys: [id number] [place]  1. Left ventricular ejection fraction, by estimation, is 60 to 65%. The left ventricle has normal function. The left ventricle has no regional wall motion abnormalities. Left ventricular diastolic parameters are indeterminate. There is the interventricular septum is flattened in systole and diastole, consistent with right ventricular pressure and volume overload.  2. Right ventricular systolic function is mildly reduced. The right ventricular size is moderately to severely enlarged. There is moderately elevated pulmonary artery systolic pressure. The estimated right ventricular systolic pressure is 28.4 mmHg.  3. Left atrial size was mildly dilated.  4. Right atrial size was moderately dilated.  5. Mitral leaflets show significantly decreased excursion, although mean gradient suggests mild mitral stenosis.. The mitral valve is degenerative, severely calcified and associated with severe annular calcification. Trivial mitral valve regurgitation.  6. Tricuspid valve regurgitation is moderate.  7. The aortic valve is tricuspid. Aortic valve regurgitation is not  visualized. Mild aortic valve stenosis. Aortic valve mean gradient measures 5.7 mmHg. Aortic valve Vmax measures 1.44 m/s.  8. The inferior vena cava is dilated in size with <50% respiratory variability, suggesting right atrial pressure of 15 mmHg. FINDINGS  Left Ventricle: Left ventricular ejection fraction, by estimation, is 60 to 65%. The left ventricle has normal function. The left ventricle has no regional wall motion abnormalities. The left ventricular internal cavity size was normal in size. There is  no left ventricular hypertrophy. The interventricular septum is flattened in systole and diastole, consistent with right ventricular pressure and volume overload. Left ventricular diastolic parameters are indeterminate. Right Ventricle: The right ventricular size is severely enlarged. No increase in right ventricular wall thickness. Right ventricular  systolic function is mildly reduced. There is moderately elevated pulmonary artery systolic pressure. The tricuspid regurgitant velocity is 2.55 m/s, and with an assumed right atrial pressure of 15 mmHg, the estimated right ventricular systolic pressure is 21.3 mmHg. Left Atrium: Left atrial size was mildly dilated. Right Atrium: Right atrial size was moderately dilated. Pericardium: Trivial pericardial effusion is present. The pericardial effusion is posterior to the left ventricle. Mitral Valve: Mitral leaflets show significantly decreased excursion, although mean gradient suggests mild mitral stenosis. The mitral valve is degenerative in appearance. There is severe calcification of the mitral valve leaflet(s). Severe mitral annular calcification. Trivial mitral valve regurgitation. MV peak gradient, 15.3 mmHg. The mean mitral valve gradient is 4.5 mmHg. Tricuspid Valve: The tricuspid valve is grossly normal. Tricuspid valve regurgitation is moderate. Aortic Valve: The aortic valve is tricuspid. Aortic valve regurgitation is not visualized. Mild aortic stenosis is  present. Mild to moderate aortic valve annular calcification. Aortic valve mean gradient measures 5.7 mmHg. Aortic valve peak gradient measures 8.3 mmHg. Aortic valve area, by VTI measures 1.10 cm. Pulmonic Valve: The pulmonic valve was grossly normal. Pulmonic valve regurgitation is trivial. Aorta: The aortic root is normal in size and structure. Venous: The inferior vena cava is dilated in size with less than 50% respiratory variability, suggesting right atrial pressure of 15 mmHg. IAS/Shunts: No atrial level shunt detected by color flow Doppler.  LEFT VENTRICLE PLAX 2D LVIDd:         3.75 cm LVIDs:         2.49 cm LV PW:         0.92 cm LV IVS:        0.95 cm LVOT diam:     1.60 cm LV SV:         33 LV SV Index:   18 LVOT Area:     2.01 cm  RIGHT VENTRICLE TAPSE (M-mode): 1.8 cm LEFT ATRIUM             Index       RIGHT ATRIUM           Index LA diam:        4.80 cm 2.63 cm/m  RA Area:     25.60 cm LA Vol (A2C):   98.6 ml 53.97 ml/m RA Volume:   90.00 ml  49.26 ml/m LA Vol (A4C):   66.7 ml 36.51 ml/m LA Biplane Vol: 82.1 ml 44.94 ml/m  AORTIC VALVE AV Area (Vmax):    1.05 cm AV Area (Vmean):   0.93 cm AV Area (VTI):     1.10 cm AV Vmax:           144.33 cm/s AV Vmean:          113.667 cm/s AV VTI:            0.297 m AV Peak Grad:      8.3 mmHg AV Mean Grad:      5.7 mmHg LVOT Vmax:         75.50 cm/s LVOT Vmean:        52.800 cm/s LVOT VTI:          0.163 m LVOT/AV VTI ratio: 0.55  AORTA Ao Root diam: 2.60 cm MITRAL VALVE                TRICUSPID VALVE MV Area (PHT): 2.87 cm     TR Peak grad:   26.0 mmHg MV Peak grad:  15.3 mmHg    TR Vmax:  255.00 cm/s MV Mean grad:  4.5 mmHg MV Vmax:       1.96 m/s     SHUNTS MV Vmean:      84.4 cm/s    Systemic VTI:  0.16 m MV Decel Time: 264 msec     Systemic Diam: 1.60 cm MV E velocity: 174.00 cm/s Rozann Lesches MD Electronically signed by Rozann Lesches MD Signature Date/Time: 03/19/2019/12:40:07 PM    Final    [place] EKG SITE RITE  Result Date:  03/20/2019 If Site Rite image not attached, placement could not be confirmed due to current cardiac rhythm.   Microbiology: No results found for this or any previous visit (from the past 240 hour(s)).   Labs: Basic Metabolic Panel: Recent Labs  Lab 03/22/19 0439 03/22/19 0439 03/23/19 0542 03/23/19 0542 03/24/19 1219 03/24/19 0552 03/25/19 0601 03/25/19 0602 03/25/19 0602 03/25/19 0737 03/25/19 0741 03/26/19 0410 03/27/19 0425 03/28/19 0526  NA 130*   < > 137   < > 136   < >  --  136   < > 139 139 135 139 139  K 4.0   < > 3.4*   < > 3.9   < >  --  3.8   < > 3.8 3.7 3.6 3.3* 3.7  CL 93*   < > 96*   < > 94*  --   --  92*  --   --   --  93* 94* 95*  CO2 23   < > 29   < > 31  --   --  30  --   --   --  _0 GLUCOSE 134*   < > 79   < > 157*  --   --  110*  --   --   --  141* 123* 159*  BUN 59*   < > 49*   < > 39*  --   --  31*  --   --   --  26* 28* 28*  CREATININE 2.55*   < > 1.88*   < > 1.60*  --   --  1.61*  --   --   --  1.59* 1.56* 1.74*  CALCIUM 9.7   < > 10.1   < > 10.1  --   --  10.0  --   --   --  9.5 9.5 9.4  MG 1.8  --  1.6*  --  1.6*  --  1.8  --   --   --   --   --   --   --   PHOS 5.5*  --  4.4  --  4.1  --   --  3.7  --   --   --  3.9  --   --    < > = values in this interval not displayed.   Liver Function Tests: Recent Labs  Lab 03/22/19 0439 03/23/19 0542 03/24/19 0552 03/25/19 0602 03/26/19 0410  ALBUMIN 3.2* 3.4* 3.3* 3.5 3.4*   No results for input(s): LIPASE, AMYLASE in the last 168 hours. No results for input(s): AMMONIA in the last 168 hours. CBC: Recent Labs  Lab 03/24/19 0552 03/24/19 0552 03/25/19 0601 03/25/19 0601 03/25/19 0737 03/25/19 0741 03/26/19 0410 03/27/19 0425 03/28/19 0526  WBC 5.6  --  7.7  --   --   --  6.5 7.1 6.6  HGB 9.4*   < > 10.5*   < > 10.9* 10.9* 10.2* 10.4* 11.1*  HCT 29.9*   < > 32.6*   < > 32.0* 32.0* 32.0* 33.3* 35.4*  MCV 96.5  --  97.6  --   --   --  96.1 97.4 98.3  PLT 165  --  182  --   --   --   169 178 184   < > = values in this interval not displayed.   Cardiac Enzymes: No results for input(s): CKTOTAL, CKMB, CKMBINDEX, TROPONINI in the last 168 hours. BNP: BNP (last 3 results) Recent Labs    03/17/19 2213  BNP 338.0*    ProBNP (last 3 results) Recent Labs    09/12/18 1201  PROBNP 51.0    CBG: Recent Labs  Lab 03/27/19 1149 03/27/19 1644 03/27/19 2140 03/28/19 0637 03/28/19 1126  GLUCAP 110* 146* 150* 137* 253*       Signed:  Oswald Hillock MD.  [facility] 03/28/2019, 1:13 PM

## 2019-03-28 NOTE — TOC Transition Note (Signed)
Transition of Care [facility]) - CM/SW Discharge Note   Patient Details  Name: Ellen Jordan MRN: [id number] Date of Birth: Nov 05, 1945  Transition of Care [facility]) CM/SW Contact:  Trula Ore, [place] Phone Number: 03/28/2019, 1:56 PM   Clinical Narrative:      Patient will DC to: Home with [facility]  Anticipated DC date: 03/28/2019  Family notified: Hedy Camara  Transport by: Lavella Lemons  ?  Per MD patient ready for DC to home . RN, patient, patient's family, and facility notified of DC.  [place] signing off.   Final next level of care: Home w Hospice Care Barriers to Discharge: No Barriers Identified   Patient Goals and CMS Choice Patient states their goals for this hospitalization and ongoing recovery are:: home with hospice care CMS Medicare.gov Compare Post Acute Care list provided to:: Patient Choice offered to / list presented to : Patient  Discharge Placement                Patient to be transferred to facility by: husband Hedy Camara Name of family member notified: Hedy Camara Patient and family notified of of transfer: 03/28/19  Discharge Plan and Services     Post Acute Care Choice: [place]                               Social Determinants of Health (SDOH) Interventions     Readmission Risk Interventions Readmission Risk Prevention Plan 03/19/2019  Transportation Screening Complete  PCP or Specialist Appt within 3-5 Days Not Complete  HRI or [place] Complete  Social Work Consult for [place] Planning/Counseling Complete  Palliative Care Screening Not Complete  Medication Review Press photographer) Complete  Some recent data might be hidden

## 2019-03-29 ENCOUNTER — Telehealth: Payer: Self-pay

## 2019-03-29 NOTE — Telephone Encounter (Signed)
Palliative Medicine RN Note: Rec'd a call from pt's niece Janele Lague [phone]), who was involved in family meetings with the palliative care team via phone. Yesterday, before patient was discharge, I called their liaison Dorian Pod and confirmed that an admission RN was going to meet the patient at home at 5:30 and they they were ready to admit. Ellen Jordan is distressed today and reports that when [facility] went out to admit Ellen Jordan last night, they "said she didn't qualify" and thus didn't admit her.  I tried to call their liaison Dorian Pod, but did not get an answer. I called their office and spoke with Amy. She reports that when admitting RN assessed Ellen Jordan and reviewed her records, their MD denied as not hospice appropriate.   Ellen Jordan lives in [place], [place]. I called [facility] spoke with their intake dept and, with family approval, I sent d/c summary, PMT note, HF note so they can review for appropriateness. They will review and get back to me.  Marjie Skiff Britteny Fiebelkorn, RN, BSN, [facility] Palliative Medicine Team 03/29/2019 10:47 AM Office [phone]    [facility] [place] to follow up. They have rec'd approval from their Medical Director, have gotten the OK from Dr Nevada Crane to act as her attending, and have spoken to family (admit visit set up for this afternoon). I called Melissa back to make sure she had the update; no further questions.  Marjie Skiff Nazar Kuan, RN, BSN, [facility] Palliative Medicine Team 03/29/2019 11:29 AM Office [phone]

## 2019-04-03 DIAGNOSIS — J45901 Unspecified asthma with (acute) exacerbation: Secondary | ICD-10-CM | POA: Diagnosis not present

## 2019-04-03 DIAGNOSIS — F331 Major depressive disorder, recurrent, moderate: Secondary | ICD-10-CM | POA: Diagnosis not present

## 2019-04-03 DIAGNOSIS — R600 Localized edema: Secondary | ICD-10-CM | POA: Diagnosis not present

## 2019-04-03 DIAGNOSIS — I4891 Unspecified atrial fibrillation: Secondary | ICD-10-CM | POA: Diagnosis not present

## 2019-04-03 DIAGNOSIS — E1129 Type 2 diabetes mellitus with other diabetic kidney complication: Secondary | ICD-10-CM | POA: Diagnosis not present

## 2019-04-03 DIAGNOSIS — K219 Gastro-esophageal reflux disease without esophagitis: Secondary | ICD-10-CM | POA: Diagnosis not present

## 2019-04-03 DIAGNOSIS — I10 Essential (primary) hypertension: Secondary | ICD-10-CM | POA: Diagnosis not present

## 2019-04-03 DIAGNOSIS — E1165 Type 2 diabetes mellitus with hyperglycemia: Secondary | ICD-10-CM | POA: Diagnosis not present

## 2019-04-08 ENCOUNTER — Other Ambulatory Visit (HOSPITAL_COMMUNITY): Admission: RE | Admit: 2019-04-08 | Payer: Medicare HMO | Source: Ambulatory Visit

## 2019-04-11 ENCOUNTER — Ambulatory Visit: Payer: Medicare HMO | Admitting: Pulmonary Disease

## 2019-04-11 ENCOUNTER — Other Ambulatory Visit: Payer: Self-pay | Admitting: "Endocrinology

## 2019-04-11 ENCOUNTER — Inpatient Hospital Stay: Payer: Medicare HMO | Admitting: Primary Care

## 2019-04-15 DIAGNOSIS — I2723 Pulmonary hypertension due to lung diseases and hypoxia: Secondary | ICD-10-CM | POA: Diagnosis not present

## 2019-04-15 DIAGNOSIS — J9611 Chronic respiratory failure with hypoxia: Secondary | ICD-10-CM | POA: Diagnosis not present

## 2019-04-15 DIAGNOSIS — Z0189 Encounter for other specified special examinations: Secondary | ICD-10-CM | POA: Diagnosis not present

## 2019-04-15 DIAGNOSIS — J449 Chronic obstructive pulmonary disease, unspecified: Secondary | ICD-10-CM | POA: Diagnosis not present

## 2019-04-15 DIAGNOSIS — I483 Typical atrial flutter: Secondary | ICD-10-CM | POA: Diagnosis not present

## 2019-04-15 DIAGNOSIS — I50813 Acute on chronic right heart failure: Secondary | ICD-10-CM | POA: Diagnosis not present

## 2019-04-15 DIAGNOSIS — N1832 Chronic kidney disease, stage 3b: Secondary | ICD-10-CM | POA: Diagnosis not present

## 2019-04-15 DIAGNOSIS — I348 Other nonrheumatic mitral valve disorders: Secondary | ICD-10-CM | POA: Diagnosis not present

## 2019-04-15 DIAGNOSIS — I5033 Acute on chronic diastolic (congestive) heart failure: Secondary | ICD-10-CM | POA: Diagnosis not present

## 2019-04-19 DIAGNOSIS — J441 Chronic obstructive pulmonary disease with (acute) exacerbation: Secondary | ICD-10-CM | POA: Diagnosis not present

## 2019-04-23 ENCOUNTER — Ambulatory Visit: Payer: Medicare HMO | Admitting: Primary Care

## 2019-05-09 ENCOUNTER — Other Ambulatory Visit: Payer: Self-pay | Admitting: Cardiology

## 2019-05-19 DIAGNOSIS — J441 Chronic obstructive pulmonary disease with (acute) exacerbation: Secondary | ICD-10-CM | POA: Diagnosis not present

## 2019-05-21 ENCOUNTER — Encounter: Payer: Self-pay | Admitting: "Endocrinology

## 2019-05-21 ENCOUNTER — Ambulatory Visit: Payer: Medicare HMO | Admitting: "Endocrinology

## 2019-05-21 ENCOUNTER — Other Ambulatory Visit: Payer: Self-pay

## 2019-05-21 VITALS — BP 146/76 | HR 94 | Ht 63.0 in | Wt 167.8 lb

## 2019-05-21 DIAGNOSIS — I10 Essential (primary) hypertension: Secondary | ICD-10-CM | POA: Diagnosis not present

## 2019-05-21 DIAGNOSIS — E1165 Type 2 diabetes mellitus with hyperglycemia: Secondary | ICD-10-CM

## 2019-05-21 DIAGNOSIS — E782 Mixed hyperlipidemia: Secondary | ICD-10-CM | POA: Diagnosis not present

## 2019-05-21 MED ORDER — INSULIN ASPART 100 UNIT/ML ~~LOC~~ SOLN: 8.0000 [IU] | Freq: Three times a day (TID) | SUBCUTANEOUS | 2 refills | Status: DC

## 2019-05-21 NOTE — Progress Notes (Signed)
05/21/2019   Endocrinology follow-up note  Subjective:    Patient ID: Ellen Jordan, female    DOB: 04/19/1945, PCP [name], MD   Past Medical History:  Diagnosis Date  . AKI (acute kidney injury) ([place]) 03/18/2019  . Allergic rhinitis   . Allergy   . Anxiety   . Atrial fibrillation ([place])   . Carpal tunnel syndrome    left  . Cataract   . Depression   . Diabetic neuropathy ([place])   . Dyspnea   . Essential hypertension   . GERD (gastroesophageal reflux disease)   . Mixed hyperlipidemia   . Normocytic anemia 12/18/2018  . PAF (paroxysmal atrial fibrillation) ([place])    a. s/p TEE cardioversion March 2017. b. Recurrence by event monitor 04/2016.  [place] Kitchen Pneumonia   . PONV (postoperative nausea and vomiting)    after previous bronchoscopy  . RBBB   . Restless leg syndrome   . Type 2 diabetes mellitus ([place])   . Uterine cancer [facility])    Past Surgical History:  Procedure Laterality Date  . ABDOMINAL HYSTERECTOMY     uterine cancer  . BACK SURGERY    . CARDIOVERSION N/A 03/11/2015   Procedure: CARDIOVERSION;  Surgeon: [name], MD;  Location: [place];  Service: Cardiovascular;  Laterality: N/A;  . CARDIOVERSION N/A 03/27/2019   Procedure: CARDIOVERSION;  Surgeon: Larey Dresser, MD;  Location: [facility] ENDOSCOPY;  Service: Cardiovascular;  Laterality: N/A;  . CHOLECYSTECTOMY    . EYE SURGERY     cataracts  . FLEXIBLE BRONCHOSCOPY N/A 07/30/2018   Procedure: FLEXIBLE BRONCHOSCOPY;  Surgeon: Sinda Du, MD;  [facility];  Service: Cardiopulmonary;  Laterality: N/A;  . RIGHT AND LEFT HEART CATH N/A 03/25/2019   Procedure: RIGHT AND LEFT HEART CATH;  Surgeon: Larey Dresser, MD;  Location: [place] CV LAB;  Service: Cardiovascular;  Laterality: N/A;  . SPINE SURGERY    . TEE WITHOUT CARDIOVERSION N/A 03/11/2015   Procedure: TRANSESOPHAGEAL ECHOCARDIOGRAM (TEE);  Surgeon: [name], MD;  Location: [place];  Service: Cardiovascular;  Laterality:  N/A;  . TEE WITHOUT CARDIOVERSION N/A 03/27/2019   Procedure: TRANSESOPHAGEAL ECHOCARDIOGRAM (TEE);  Surgeon: Larey Dresser, MD;  Location: [facility] ENDOSCOPY;  Service: Cardiovascular;  Laterality: N/A;  . VIDEO BRONCHOSCOPY WITH ENDOBRONCHIAL ULTRASOUND N/A 12/19/2018   Procedure: VIDEO BRONCHOSCOPY WITH ENDOBRONCHIAL ULTRASOUND;  Surgeon: [name], MD;  Location: [place];  Service: Thoracic;  Laterality: N/A;   Social History   Socioeconomic History  . Marital status: Married    Spouse name: earl  . Number of children: 2  . Years of education: 46  . Highest education level: Not on file  Occupational History  . Occupation: retired  Tobacco Use  . Smoking status: Former Smoker    Packs/day: 1.00    Years: 50.00    Pack years: 50.00    Types: Cigarettes    Start date: 01/10/1961    Quit date: 01/10/2005    Years since quitting: 14.3  . Smokeless tobacco: Former Systems developer    Quit date: 01/10/2005  Substance and Sexual Activity  . Alcohol use: No    Alcohol/week: 0.0 standard drinks  . Drug use: No  . Sexual activity: Not Currently    Partners: Male  Other Topics Concern  . Not on file  Social History Narrative   Moved to this area in 60 from [place].    Married for over 50 years. Has two children. Lives with husband  Toby (earl)   Likes to read   Retired from [employer].   Enjoys time with family, beach, read, exericse.   Walk dog.   Eats all food groups.   Wear seatbelt.   Wear sunscreen.   Social Determinants of Health   Financial Resource Strain:   . Difficulty of Paying Living Expenses:   Food Insecurity:   . Worried About Charity fundraiser in the Last Year:   . Arboriculturist in the Last Year:   Transportation Needs:   . Film/video editor (Medical):   [place] Lack of Transportation (Non-Medical):   Physical Activity:   . Days of Exercise per Week:   . Minutes of Exercise per Session:   Stress:   . Feeling of Stress :   Social Connections:   .  Frequency of Communication with Friends and Family:   . Frequency of Social Gatherings with Friends and Family:   . Attends Religious Services:   . Active Member of Clubs or Organizations:   . Attends Archivist Meetings:   [place] Marital Status:    Outpatient Encounter Medications as of 05/21/2019  Medication Sig  . Accu-Chek FastClix Lancets MISC USE TO TEST BLOOD GLUCOSE FOUR TIMES DAILY (Patient taking differently: 1 Device by Other route in the morning, at noon, in the evening, and at bedtime. )  . ACCU-CHEK GUIDE test strip USE AS INSTRUCTED FOUR TIMES DAILY  . albuterol (PROVENTIL HFA;VENTOLIN HFA) 108 (90 Base) MCG/ACT inhaler Inhale 2 puffs into the lungs every 6 (six) hours as needed for wheezing or shortness of breath.  . Alcohol Swabs (B-D SINGLE USE SWABS REGULAR) PADS USE FOUR TIMES DAILY  . amiodarone (PACERONE) 200 MG tablet Take 400 mg po twice daily x 5 days then   take 200 mg po twice daily x 10 days then Take 200 mg po daily  . amLODipine (NORVASC) 5 MG tablet Take 1 tablet (5 mg total) by mouth daily.  [place] Kitchen atorvastatin (LIPITOR) 40 MG tablet Take 1 tablet (40 mg total) by mouth daily.  [place] Kitchen b complex vitamins tablet Take 1 tablet by mouth daily.  . Biotin 5000 MCG CAPS Take 1 capsule by mouth daily.  . Blood Glucose Monitoring Suppl (ACCU-CHEK GUIDE) w/Device KIT 1 each by Does not apply route 4 (four) times daily.  . chlorpheniramine (EQ CHLORTABS) 4 MG tablet Take 4 mg by mouth 2 (two) times daily as needed for allergies.  . cholecalciferol (VITAMIN D) 1000 units tablet Take 1,000 Units by mouth daily.  . Continuous Blood Gluc Sensor (FREESTYLE LIBRE 14 DAY SENSOR) MISC Inject 1 each into the skin every 14 (fourteen) days. Use as directed.  . diphenhydrAMINE (BENADRYL) 25 MG tablet Take 25 mg by mouth daily as needed for allergies.   [place] ELIQUIS 5 MG TABS tablet TAKE 1 TABLET TWICE DAILY (Patient taking differently: Take 5 mg by mouth 2 (two) times daily. )  .  fluticasone furoate-vilanterol (BREO ELLIPTA) 100-25 MCG/INH AEPB Inhale 1 puff into the lungs daily. (Patient taking differently: Inhale 1 puff into the lungs every other day. )  . insulin aspart (NOVOLOG) 100 UNIT/ML injection Inject 8-14 Units into the skin 3 (three) times daily with meals.  . insulin detemir (LEVEMIR) 100 UNIT/ML injection Inject 0.5 mLs (50 Units total) into the skin at bedtime.  [place] Kitchen ipratropium-albuterol (DUONEB) 0.5-2.5 (3) MG/3ML SOLN Take 3 mLs by nebulization every 6 (six) hours as needed (wheezing/ shortness of breath).  . liraglutide ([place])  18 MG/3ML SOPN Inject 0.3 mLs (1.8 mg total) into the skin daily.  . Misc Natural Products (ESTROVEN + ENERGY MAX STRENGTH) TABS Take 1 tablet by mouth daily.  . Multiple Vitamin (MULTIVITAMIN) tablet Take 1 tablet by mouth daily.  [place] Kitchen omeprazole (PRILOSEC) 20 MG capsule TAKE 1 CAPSULE EVERY DAY (Patient taking differently: Take 20 mg by mouth daily. )  . potassium chloride 20 MEQ TBCR Take 20 mEq by mouth daily.  . sertraline (ZOLOFT) 100 MG tablet TAKE 1 TABLET (100 MG TOTAL) BY MOUTH DAILY.  [place] Kitchen torsemide (DEMADEX) 20 MG tablet Take 4 tablets (80 mg total) by mouth daily.  . TRELEGY ELLIPTA 100-62.5-25 MCG/INH AEPB Inhale 1 puff into the lungs every other day.   . vitamin E (VITAMIN E) 400 UNIT capsule Take 400 Units by mouth daily.  . [DISCONTINUED] insulin aspart (NOVOLOG) 100 UNIT/ML injection Inject 10-16 Units into the skin 3 (three) times daily with meals.   Facility-Administered Encounter Medications as of 05/21/2019  Medication  . lactated ringers infusion   ALLERGIES: Allergies  Allergen Reactions  . Penicillins Hives    Has patient had a PCN reaction causing immediate rash, facial/tongue/throat swelling, SOB or lightheadedness with hypotension: Yes Has patient had a PCN reaction causing severe rash involving mucus membranes or skin necrosis: No Has patient had a PCN reaction that required hospitalization No Has  patient had a PCN reaction occurring within the last 10 years: No If all of the above answers are "NO", then may proceed with Cephalosporin use.    VACCINATION STATUS: Immunization History  Administered Date(s) Administered  . Influenza, High Dose Seasonal PF 11/14/2018  . Influenza,inj,Quad PF,6+ Mos 11/04/2016  . Pneumococcal Conjugate-13 11/30/2016  . Pneumococcal Polysaccharide-23 06/07/2017  . Td 06/07/2017    Diabetes She presents for her follow-up diabetic visit. She has type 2 diabetes mellitus. Onset time: She was diagnosed at approximate age of 66 years. Her disease course has been improving. There are no hypoglycemic associated symptoms. Pertinent negatives for hypoglycemia include no confusion, headaches, pallor or seizures. There are no diabetic associated symptoms. Pertinent negatives for diabetes include no chest pain, no polydipsia, no polyphagia and no polyuria. There are no hypoglycemic complications. Symptoms are improving. There are no diabetic complications. Risk factors for coronary artery disease include diabetes mellitus, dyslipidemia, hypertension, obesity, sedentary lifestyle and tobacco exposure. Current diabetic treatment includes intensive insulin program. She is compliant with treatment most of the time. Her weight is fluctuating minimally. She is following a generally unhealthy diet. She has not had a previous visit with a dietitian (She declined referral to CDE.). Her home blood glucose trend is decreasing steadily. Her breakfast blood glucose range is generally 130-140 mg/dl. Her lunch blood glucose range is generally 140-180 mg/dl. Her dinner blood glucose range is generally 140-180 mg/dl. Her bedtime blood glucose range is generally 140-180 mg/dl. Her overall blood glucose range is 140-180 mg/dl. An ACE inhibitor/angiotensin II receptor blocker is being taken. She does not see a podiatrist.Eye exam is not current (She is overdue for eye exam, I advised her to  schedule a visit to her eye doctor.).  Hyperlipidemia This is a chronic problem. The current episode started more than 1 year ago. The problem is controlled. Exacerbating diseases include diabetes and obesity. Pertinent negatives include no chest pain, leg pain, myalgias or shortness of breath. Current antihyperlipidemic treatment includes statins. Risk factors for coronary artery disease include dyslipidemia, diabetes mellitus, hypertension, a sedentary lifestyle and obesity.  Hypertension This is a  chronic problem. The current episode started more than 1 year ago. The problem is controlled. Pertinent negatives include no chest pain, headaches, palpitations or shortness of breath. Risk factors for coronary artery disease include diabetes mellitus, dyslipidemia, obesity, sedentary lifestyle and smoking/tobacco exposure. Past treatments include ACE inhibitors. The current treatment provides moderate improvement.     Review of systems  Constitutional: + Minimally fluctuating body weight,  current  Body mass index is 29.72 kg/m. , no fatigue, no subjective hyperthermia, no subjective hypothermia Eyes: no blurry vision, no xerophthalmia ENT: no sore throat, no nodules palpated in throat, no dysphagia/odynophagia, no hoarseness Cardiovascular: no Chest Pain, no Shortness of Breath, no palpitations, no leg swelling Respiratory: no cough, no shortness of breath Gastrointestinal: no Nausea/Vomiting/Diarhhea Musculoskeletal: no muscle/joint aches Skin: no rashes, no hyperemia Neurological: no tremors, no numbness, no tingling, no dizziness Psychiatric: no depression, no anxiety    Objective:    BP (!) 146/76   Pulse 94   Ht _0  (1.6 m)   Wt 167 lb 12.8 oz (76.1 kg)   BMI 29.72 kg/m   Wt Readings from Last 3 Encounters:  05/21/19 167 lb 12.8 oz (76.1 kg)  03/28/19 171 lb 11.2 oz (77.9 kg)  02/14/19 176 lb 9.6 oz (80.1 kg)     Physical Exam- Limited  Constitutional:  Body mass index is  29.72 kg/m. , not in acute distress, normal state of mind Eyes:  EOMI, no exophthalmos Neck: Supple Thyroid: No gross goiter Respiratory: Adequate breathing efforts Musculoskeletal: no gross deformities, strength intact in all four extremities, no gross restriction of joint movements Skin:  no rashes, no hyperemia Neurological: no tremor with outstretched hands,   CMP Latest Ref Rng & Units 03/28/2019 03/27/2019 03/26/2019  Glucose 70 - 99 mg/dL 159(H) 123(H) 141(H)  BUN 8 - 23 mg/dL 28(H) 28(H) 26(H)  Creatinine 0.44 - 1.00 mg/dL 1.74(H) 1.56(H) 1.59(H)  Sodium 135 - 145 mmol/L 139 139 135  Potassium 3.5 - 5.1 mmol/L 3.7 3.3(L) 3.6  Chloride 98 - 111 mmol/L 95(L) 94(L) 93(L)  CO2 22 - 32 mmol/L _1 Calcium 8.9 - 10.3 mg/dL 9.4 9.5 9.5  Total Protein 6.5 - 8.1 g/dL - - -  Total Bilirubin 0.3 - 1.2 mg/dL - - -  Alkaline Phos 38 - 126 U/L - - -  AST 15 - 41 U/L - - -  ALT 0 - 44 U/L - - -     Lipid Panel     Component Value Date/Time   CHOL 90 03/21/2019 0605   TRIG 118 03/21/2019 0605   HDL 22 (L) 03/21/2019 0605   CHOLHDL 4.1 03/21/2019 0605   VLDL 24 03/21/2019 0605   LDLCALC 44 03/21/2019 0605   LDLCALC 63 06/16/2017 0959   Results for TYNLEIGH, BIRT (MRN [id number]) as of 05/21/2019 12:30  Ref. Range 07/23/2018 00:00 12/18/2018 11:02 03/18/2019 05:05  Hemoglobin A1C Latest Ref Range: 4.8 - 5.6 % 6.4 7.2 (H) 6.7 (H)    Assessment & Plan:   1. Uncontrolled type 2 diabetes mellitus without complication, with long-term current use of insulin   - She remains at a high risk for more acute and chronic complications of diabetes which include CAD, CVA, CKD, retinopathy, and neuropathy. These are all discussed in detail with the patient.  She presents with tightly controlled glycemic profile at fasting and near target postprandial.  Her recent labs show A1c of 6.7%, improving from 7.2%.  -Overall, she has improved her A1c from  14.6%.    Recent labs reviewed.   - I have  re-counseled the patient on diet management  by adopting a carbohydrate restricted / protein rich  Diet.  - she  admits there is a room for improvement in her diet and drink choices. -  Suggestion is made for her to avoid simple carbohydrates  from her diet including Cakes, Sweet Desserts / Pastries, Ice Cream, Soda (diet and regular), Sweet Tea, Candies, Chips, Cookies, Sweet Pastries,  Store Bought Juices, Alcohol in Excess of  1-2 drinks a day, Artificial Sweeteners, Coffee Creamer, and "Sugar-free" Products. This will help patient to have stable blood glucose profile and potentially avoid unintended weight gain.  - Patient is advised to stick to a routine mealtimes to eat 3 meals  a day and avoid unnecessary snacks ( to snack only to correct hypoglycemia).  - The patient  declined a referral to a  CDE for individualized DM education.  - I have approached patient with the following individualized plan to manage diabetes and patient agrees.  -Based on her stable glycemic profile, I advised her to continue on her current dosing regimen of insulin.  -She is advised to continue Levemir 50 units nightly, advised to lower NovoLog to 8  units units   3 times a day before meals  , plus correction dose associated with monitoring of blood glucose 4 times a day-before meals and at bedtime.  - She is benefiting from Victoza therapy.  She is advised to continue  Victoza at 1.8 mg subcutaneously daily .  - She did not get coverage for a CGM device.    - Patient specific target  for A1c; LDL, HDL, Triglycerides,were discussed in detail.  2) BP/HTN: Her blood pressure is not controlled to target.    She is advised to continue her current medications including lisinopril 20 mg p.o. daily.    3) Lipids/HPL: Recent lipid panel revealed controlled LDL at 63. She is advised to continue atorvastatin 40 mg p.o. nightly.  Side effects and precautions discussed with her.      4)  Weight/Diet: Her BMI is  29.7-she is not a candidate for major weight loss.  She has declined CDE consult, exercise, and carbohydrates information provided.  5) Chronic Care/Health Maintenance:  -Patient is on ACEI/ARB and Statin medications and encouraged to continue to follow up with Ophthalmology (she is overdue for her eye exam), Podiatrist at least yearly or according to recommendations, and advised to  stay away from smoking. I have recommended yearly flu vaccine and pneumonia vaccination at least every 5 years; moderate intensity exercise for up to 150 minutes weekly; and  sleep for at least 7 hours a day.    - I advised patient to maintain close follow up with [name], MD for primary care needs.  - Time spent on this patient care encounter:  35 min, of which > 50% was spent in  counseling and the rest reviewing her blood glucose logs , discussing her hypoglycemia and hyperglycemia episodes, reviewing her current and  previous labs / studies  ( including abstraction from other facilities) and medications  doses and developing a  long term treatment plan and documenting her care.   Please refer to Patient Instructions for Blood Glucose Monitoring and Insulin/Medications Dosing Guide"  in media tab for additional information. Please  also refer to " Patient Self Inventory" in the Media  tab for reviewed elements of pertinent patient history.  Ellen Jordan participated in  the discussions, expressed understanding, and voiced agreement with the above plans.  All questions were answered to her satisfaction. she is encouraged to contact clinic should she have any questions or concerns prior to her return visit.   Follow up plan: -Return in about 6 months (around 11/21/2019) for F/U with Pre-visit Labs, Meter, Logs, A1c here.Glade Lloyd, MD Phone: [phone]  Fax: [phone]   -  This note was partially dictated with voice recognition software. Similar sounding words can be transcribed inadequately or may  not  be corrected upon review.  05/21/2019, 12:28 PM

## 2019-06-19 DIAGNOSIS — J441 Chronic obstructive pulmonary disease with (acute) exacerbation: Secondary | ICD-10-CM | POA: Diagnosis not present

## 2019-06-26 ENCOUNTER — Other Ambulatory Visit: Payer: Self-pay

## 2019-06-26 DIAGNOSIS — E1165 Type 2 diabetes mellitus with hyperglycemia: Secondary | ICD-10-CM

## 2019-06-26 MED ORDER — INSULIN DETEMIR 100 UNIT/ML ~~LOC~~ SOLN: 50.0000 [IU] | Freq: Every day | SUBCUTANEOUS | 2 refills | Status: DC

## 2019-06-26 MED ORDER — VICTOZA 18 MG/3ML ~~LOC~~ SOPN: 1.8000 mg | PEN_INJECTOR | Freq: Every day | SUBCUTANEOUS | 2 refills | Status: AC

## 2019-06-26 MED ORDER — INSULIN ASPART 100 UNIT/ML ~~LOC~~ SOLN: 8.0000 [IU] | Freq: Three times a day (TID) | SUBCUTANEOUS | 2 refills | Status: DC

## 2019-07-29 ENCOUNTER — Telehealth: Payer: Self-pay | Admitting: Cardiology

## 2019-07-29 DIAGNOSIS — I50810 Right heart failure, unspecified: Secondary | ICD-10-CM

## 2019-07-29 NOTE — Telephone Encounter (Signed)
Had to review chart to understand situation more clearly.  Our last office visit was in February 2020.  She was hospitalized earlier this year, progress note from Dr. Aundra Dubin in March indicated plan for close follow-up in the heart failure clinic.  If this has not occurred, please make a referral.

## 2019-07-29 NOTE — Telephone Encounter (Signed)
I will forward to Dr.McDowell for dispo.

## 2019-07-29 NOTE — Telephone Encounter (Signed)
New message    Patient is requesting Dr Domenic Polite put in a referral for her to follow up with DR Aundra Dubin, she seen him when she was in the hospital .

## 2019-07-29 NOTE — Telephone Encounter (Signed)
I informed patient that ref was placed to heart failure clinic

## 2019-08-14 DIAGNOSIS — Z0189 Encounter for other specified special examinations: Secondary | ICD-10-CM | POA: Diagnosis not present

## 2019-08-14 DIAGNOSIS — I483 Typical atrial flutter: Secondary | ICD-10-CM | POA: Diagnosis not present

## 2019-08-14 DIAGNOSIS — N1832 Chronic kidney disease, stage 3b: Secondary | ICD-10-CM | POA: Diagnosis not present

## 2019-08-14 DIAGNOSIS — R600 Localized edema: Secondary | ICD-10-CM | POA: Diagnosis not present

## 2019-08-14 DIAGNOSIS — R42 Dizziness and giddiness: Secondary | ICD-10-CM | POA: Diagnosis not present

## 2019-08-14 DIAGNOSIS — Z0001 Encounter for general adult medical examination with abnormal findings: Secondary | ICD-10-CM | POA: Diagnosis not present

## 2019-08-14 DIAGNOSIS — J060 Acute laryngopharyngitis: Secondary | ICD-10-CM | POA: Diagnosis not present

## 2019-08-14 DIAGNOSIS — Z6830 Body mass index (BMI) 30.0-30.9, adult: Secondary | ICD-10-CM | POA: Diagnosis not present

## 2019-08-14 DIAGNOSIS — J45901 Unspecified asthma with (acute) exacerbation: Secondary | ICD-10-CM | POA: Diagnosis not present

## 2019-09-12 ENCOUNTER — Other Ambulatory Visit: Payer: Self-pay | Admitting: "Endocrinology

## 2019-09-27 ENCOUNTER — Telehealth (HOSPITAL_COMMUNITY): Payer: Self-pay | Admitting: Vascular Surgery

## 2019-09-27 NOTE — Telephone Encounter (Signed)
Left pt message giving f/u appt w/ [facility] 10/5 @ 10:40 asked pt to call to confirm appt date and time

## 2019-10-03 ENCOUNTER — Other Ambulatory Visit: Payer: Self-pay | Admitting: Cardiology

## 2019-10-15 ENCOUNTER — Encounter (HOSPITAL_COMMUNITY): Payer: Medicare HMO | Admitting: Cardiology

## 2019-11-11 ENCOUNTER — Other Ambulatory Visit: Payer: Self-pay

## 2019-11-11 ENCOUNTER — Encounter (HOSPITAL_COMMUNITY): Payer: Self-pay | Admitting: Cardiology

## 2019-11-11 ENCOUNTER — Ambulatory Visit (HOSPITAL_COMMUNITY)
Admission: RE | Admit: 2019-11-11 | Discharge: 2019-11-11 | Disposition: A | Discharge status: Home / Self Care | Source: Ambulatory Visit | Attending: Cardiology | Admitting: Cardiology

## 2019-11-11 VITALS — BP 120/70 | HR 60 | Wt 166.6 lb

## 2019-11-11 DIAGNOSIS — Z833 Family history of diabetes mellitus: Secondary | ICD-10-CM | POA: Diagnosis not present

## 2019-11-11 DIAGNOSIS — I5032 Chronic diastolic (congestive) heart failure: Secondary | ICD-10-CM | POA: Insufficient documentation

## 2019-11-11 DIAGNOSIS — Z8249 Family history of ischemic heart disease and other diseases of the circulatory system: Secondary | ICD-10-CM | POA: Insufficient documentation

## 2019-11-11 DIAGNOSIS — N183 Chronic kidney disease, stage 3 unspecified: Secondary | ICD-10-CM | POA: Insufficient documentation

## 2019-11-11 DIAGNOSIS — E782 Mixed hyperlipidemia: Secondary | ICD-10-CM | POA: Diagnosis not present

## 2019-11-11 DIAGNOSIS — I130 Hypertensive heart and chronic kidney disease with heart failure and stage 1 through stage 4 chronic kidney disease, or unspecified chronic kidney disease: Secondary | ICD-10-CM | POA: Insufficient documentation

## 2019-11-11 DIAGNOSIS — I480 Paroxysmal atrial fibrillation: Secondary | ICD-10-CM

## 2019-11-11 DIAGNOSIS — J449 Chronic obstructive pulmonary disease, unspecified: Secondary | ICD-10-CM | POA: Diagnosis not present

## 2019-11-11 DIAGNOSIS — J849 Interstitial pulmonary disease, unspecified: Secondary | ICD-10-CM | POA: Insufficient documentation

## 2019-11-11 DIAGNOSIS — Z87891 Personal history of nicotine dependence: Secondary | ICD-10-CM | POA: Diagnosis not present

## 2019-11-11 DIAGNOSIS — I482 Chronic atrial fibrillation, unspecified: Secondary | ICD-10-CM

## 2019-11-11 DIAGNOSIS — I50810 Right heart failure, unspecified: Secondary | ICD-10-CM

## 2019-11-11 DIAGNOSIS — I050 Rheumatic mitral stenosis: Secondary | ICD-10-CM | POA: Diagnosis not present

## 2019-11-11 DIAGNOSIS — E1122 Type 2 diabetes mellitus with diabetic chronic kidney disease: Secondary | ICD-10-CM | POA: Diagnosis not present

## 2019-11-11 DIAGNOSIS — Z794 Long term (current) use of insulin: Secondary | ICD-10-CM | POA: Insufficient documentation

## 2019-11-11 DIAGNOSIS — I4892 Unspecified atrial flutter: Secondary | ICD-10-CM | POA: Diagnosis not present

## 2019-11-11 DIAGNOSIS — Z7901 Long term (current) use of anticoagulants: Secondary | ICD-10-CM | POA: Insufficient documentation

## 2019-11-11 DIAGNOSIS — Z79899 Other long term (current) drug therapy: Secondary | ICD-10-CM | POA: Insufficient documentation

## 2019-11-11 LAB — CBC
HCT: 39.4 % (ref 36.0–46.0)
Hemoglobin: 13.1 g/dL (ref 12.0–15.0)
MCH: 32 pg (ref 26.0–34.0)
MCHC: 33.2 g/dL (ref 30.0–36.0)
MCV: 96.1 fL (ref 80.0–100.0)
Platelets: 163 10*3/uL (ref 150–400)
RBC: 4.1 MIL/uL (ref 3.87–5.11)
RDW: 11.9 % (ref 11.5–15.5)
WBC: 6.7 10*3/uL (ref 4.0–10.5)
nRBC: 0 % (ref 0.0–0.2)

## 2019-11-11 LAB — COMPREHENSIVE METABOLIC PANEL
ALT: 72 U/L — ABNORMAL HIGH (ref 0–44)
AST: 71 U/L — ABNORMAL HIGH (ref 15–41)
Albumin: 3.7 g/dL (ref 3.5–5.0)
Alkaline Phosphatase: 82 U/L (ref 38–126)
Anion gap: 10 (ref 5–15)
BUN: 27 mg/dL — ABNORMAL HIGH (ref 8–23)
CO2: 29 mmol/L (ref 22–32)
Calcium: 9.1 mg/dL (ref 8.9–10.3)
Chloride: 100 mmol/L (ref 98–111)
Creatinine, Ser: 1.49 mg/dL — ABNORMAL HIGH (ref 0.44–1.00)
GFR, Estimated: 36 mL/min — ABNORMAL LOW (ref >60)
Glucose, Bld: 238 mg/dL — ABNORMAL HIGH (ref 70–99)
Potassium: 3.2 mmol/L — ABNORMAL LOW (ref 3.5–5.1)
Sodium: 139 mmol/L (ref 135–145)
Total Bilirubin: 0.7 mg/dL (ref 0.3–1.2)
Total Protein: 7.3 g/dL (ref 6.5–8.1)

## 2019-11-11 LAB — LIPID PANEL
Cholesterol: 115 mg/dL (ref 0–200)
HDL: 25 mg/dL — ABNORMAL LOW (ref >40)
LDL Cholesterol: 62 mg/dL (ref 0–99)
Total CHOL/HDL Ratio: 4.6 RATIO
Triglycerides: 140 mg/dL (ref <150)
VLDL: 28 mg/dL (ref 0–40)

## 2019-11-11 LAB — TSH: TSH: 2.13 u[IU]/mL (ref 0.350–4.500)

## 2019-11-11 MED ORDER — TORSEMIDE 20 MG PO TABS: 20.0000 mg | ORAL_TABLET | Freq: Every day | ORAL | 2 refills | Status: DC

## 2019-11-11 NOTE — Progress Notes (Signed)
PCP: [name], MD HF Cardiology: Dr. Aundra Dubin  74 y.o. with history of atrial flutter, diabetes, mitral stenosis, COPD/ILD, and chronic diastolic CHF with prominent RV failure.  She has been followed by Dr. Chase Caller in the ILD clinic and workup was ongoing.  She is a former smoker and is suspected to have a combination of COPD and ILD.  Of note, she has been found to have mediastinal and hilar adenopathy.   She was admitted in 3/21 with CHF/volume overload and atrial flutter.  Echo this admission showed LV EF 65-70% with RV moderate-severely dilated and moderate dysfunction, D-shaped septum, the mitral valve is severely calcified though mean gradient is only about 5 mmHg suggesting mild to moderate mitral stenosis (pressure half-time does not appear short), mild AS. The patient was admitted markedly volume overloaded with RV failure. She also had mitral stenosis which visually looked significant but measured only mild-moderate by mean gradient and pressure half-time on echo, this was confirmed by TEE (mild-moderate MS). She was initially oliguric with creatinine up to 4.27.  AKI resolved and she diuresed with the addition of dobutamine for RV support. RHC/LHC this admit showed mildly elevated PCWP with moderate primarily pulmonary venous hypertension. Mitral stenosis appeared moderate by cath (in setting of atrial flutter with RVR) but only mild-moderate by TEE and echo.  She had TEE-guided DCCV on 03/27/19.  She was eventually discharged home after full diuresis and weaning off dobutamine.   She was lost to followup for several months, now returns to clinic.  She remains in NSR today.  She denies significant palpitations.  She feels like she is doing much better compared to prior to 3/21 admission.  She is not using oxygen now and her oxygen saturation has been in the 90s.  She gets short of breath walking "long distances."  No dyspnea walking around the house or up stairs.  She denies orthopnea/PND.   No chest pain.  No lightheadedness/syncope.  Weight has been stable, she is only taking torsemide 20 mg daily.  No BRBPR/melena.    Labs (3/21): K 3.7, creatinine 1.74, hgb 11.1  ECG (personally reviewed): NSR, RBBB  REDS clip 29%  PMH: 1. Type 2 diabetes. 2. Atrial flutter: S/p DCCV 3/21.  3. CKD stage 3 4. Depression 5. HTN 6. H/o uterine cancer 7. Mitral stenosis: Suspect rheumatic.   - Echo (3/21): Mild mitral stenosis mean gradient 4.5.  - LHC/RHC (3/21): mitral valve mean gradient 11.5 mmHg, MVA 1.67 cm^2 => moderate MS but in atrial fibrillation with RVR.  - TEE (3/21): Mild-moderate MS with mean gradient 5 mmHg and MVA 2.2 cm^2 => in NSR.  8. Chronic diastolic CHF with prominent RV failure: Echo (3/21) with EF 60-65%, D-shaped septum, moderate-severe RV enlargement with mildly decreased RV systolic function, PASP 41, rheumatic MV with mean gradient 4.5 (mild MS), mild AS.  - RHC/LHC (3/21): mean RA 9, PA 54/34 mean 34, mean PCWP 18, CI 2.59, PVR 3.27 WU.  9. Pulmonary venous HTN: See 3/21 RHC above.  10. Chronic lung disease:  Suspected to have combination of COPD and ILD.  Of note, has had hilar/mediastinal adenopathy.  Follows with pulmonary.  11. Aortic stenosis: Mild on 3/21 echo.   Social History   Socioeconomic History  . Marital status: Married    Spouse name: earl  . Number of children: 2  . Years of education: 73  . Highest education level: Not on file  Occupational History  . Occupation: retired  Tobacco Use  .  Smoking status: Former Smoker    Packs/day: 1.00    Years: 50.00    Pack years: 50.00    Types: Cigarettes    Start date: 01/10/1961    Quit date: 01/10/2005    Years since quitting: 14.8  . Smokeless tobacco: Former Systems developer    Quit date: 01/10/2005  Vaping Use  . Vaping Use: Never used  Substance and Sexual Activity  . Alcohol use: No    Alcohol/week: 0.0 standard drinks  . Drug use: No  . Sexual activity: Not Currently    Partners: Male  Other  Topics Concern  . Not on file  Social History Narrative   Moved to this area in 23 from [place].    Married for over 50 years. Has two children. Lives with husband [name] (earl)   Likes to read   Retired from [employer].   Enjoys time with family, beach, read, exericse.   Walk dog.   Eats all food groups.   Wear seatbelt.   Wear sunscreen.   Social Determinants of Health   Financial Resource Strain:   . Difficulty of Paying Living Expenses: Not on file  Food Insecurity:   . Worried About Charity fundraiser in the Last Year: Not on file  . Ran Out of Food in the Last Year: Not on file  Transportation Needs:   . Lack of Transportation (Medical): Not on file  . Lack of Transportation (Non-Medical): Not on file  Physical Activity:   . Days of Exercise per Week: Not on file  . Minutes of Exercise per Session: Not on file  Stress:   . Feeling of Stress : Not on file  Social Connections:   . Frequency of Communication with Friends and Family: Not on file  . Frequency of Social Gatherings with Friends and Family: Not on file  . Attends Religious Services: Not on file  . Active Member of Clubs or Organizations: Not on file  . Attends Archivist Meetings: Not on file  . Marital Status: Not on file  Intimate Partner Violence:   . Fear of Current or Ex-Partner: Not on file  . Emotionally Abused: Not on file  . Physically Abused: Not on file  . Sexually Abused: Not on file   Family History  Problem Relation Age of Onset  . Heart disease Mother 67  . Hyperlipidemia Mother   . Diabetes Brother   . COPD Father 38       emphysema  . Hypertension Father   . Hyperlipidemia Father   . Diabetes Daughter   . AAA (abdominal aortic aneurysm) Son   . Diabetes Brother   . Diabetes Brother   . Diabetes Brother    ROS: All systems reviewed and negative except as per HPI.   Current Outpatient Medications  Medication Sig Dispense Refill  . Accu-Chek FastClix Lancets  MISC USE TO TEST BLOOD GLUCOSE FOUR TIMES DAILY (Patient taking differently: 1 Device by Other route in the morning, at noon, in the evening, and at bedtime. ) 408 each 2  . ACCU-CHEK GUIDE test strip USE AS INSTRUCTED FOUR TIMES DAILY 400 strip 1  . albuterol (PROVENTIL HFA;VENTOLIN HFA) 108 (90 Base) MCG/ACT inhaler Inhale 2 puffs into the lungs every 6 (six) hours as needed for wheezing or shortness of breath. 1 Inhaler 0  . Alcohol Swabs (B-D SINGLE USE SWABS REGULAR) PADS USE FOUR TIMES DAILY 400 each 2  . amiodarone (PACERONE) 200 MG tablet Take 400 mg po  twice daily x 5 days then   take 200 mg po twice daily x 10 days then Take 200 mg po daily 60 tablet 2  . amLODipine (NORVASC) 5 MG tablet Take 1 tablet (5 mg total) by mouth daily. 30 tablet 2  . atorvastatin (LIPITOR) 40 MG tablet TAKE 1 TABLET EVERY DAY 90 tablet 3  . b complex vitamins tablet Take 1 tablet by mouth daily.    . Biotin 5000 MCG CAPS Take 1 capsule by mouth daily.    . Blood Glucose Monitoring Suppl (ACCU-CHEK GUIDE) w/Device KIT 1 each by Does not apply route 4 (four) times daily. 1 kit 0  . chlorpheniramine (EQ CHLORTABS) 4 MG tablet Take 4 mg by mouth 2 (two) times daily as needed for allergies.    . cholecalciferol (VITAMIN D) 1000 units tablet Take 1,000 Units by mouth daily.    . Continuous Blood Gluc Sensor (FREESTYLE LIBRE 14 DAY SENSOR) MISC Inject 1 each into the skin every 14 (fourteen) days. Use as directed. 2 each 2  . diphenhydrAMINE (BENADRYL) 25 MG tablet Take 25 mg by mouth daily as needed for allergies.     Marland Kitchen ELIQUIS 5 MG TABS tablet TAKE 1 TABLET TWICE DAILY (Patient taking differently: Take 5 mg by mouth 2 (two) times daily. ) 180 tablet 3  . fluticasone furoate-vilanterol (BREO ELLIPTA) 100-25 MCG/INH AEPB Inhale 1 puff into the lungs daily. (Patient taking differently: Inhale 1 puff into the lungs every other day. ) 2 each 0  . insulin aspart (NOVOLOG) 100 UNIT/ML injection Inject 8-14 Units into the  skin 3 (three) times daily with meals. 50 mL 2  . insulin detemir (LEVEMIR) 100 UNIT/ML injection Inject 0.5 mLs (50 Units total) into the skin at bedtime. 60 mL 2  . ipratropium-albuterol (DUONEB) 0.5-2.5 (3) MG/3ML SOLN Take 3 mLs by nebulization every 6 (six) hours as needed (wheezing/ shortness of breath).    . liraglutide (VICTOZA) 18 MG/3ML SOPN Inject 0.3 mLs (1.8 mg total) into the skin daily. 36 mL 2  . Misc Natural Products (ESTROVEN + ENERGY MAX STRENGTH) TABS Take 1 tablet by mouth daily.    Marland Kitchen omeprazole (PRILOSEC) 20 MG capsule TAKE 1 CAPSULE EVERY DAY (Patient taking differently: Take 20 mg by mouth daily. ) 90 capsule 1  . sertraline (ZOLOFT) 100 MG tablet TAKE 1 TABLET (100 MG TOTAL) BY MOUTH DAILY. 90 tablet 0  . torsemide (DEMADEX) 20 MG tablet Take 1 tablet (20 mg total) by mouth daily. 30 tablet 2  . vitamin E (VITAMIN E) 400 UNIT capsule Take 400 Units by mouth daily.     No current facility-administered medications for this encounter.   Facility-Administered Medications Ordered in Other Encounters  Medication Dose Route Frequency Provider Last Rate Last Admin  . lactated ringers infusion   Intravenous Continuous PRN Kathryne Hitch, CRNA   New Bag at 12/18/18 1133   BP 120/70   Pulse 60   Wt 75.6 kg (166 lb 9.6 oz)   SpO2 95%   BMI 29.51 kg/m  General: NAD Neck: No JVD, no thyromegaly or thyroid nodule.  Lungs: Crackles at bases. CV: Nondisplaced PMI.  Heart regular S1/S2, no S3/S4, 2/6 SEM RUSB.  No peripheral edema.  No carotid bruit.  Normal pedal pulses.  Abdomen: Soft, nontender, no hepatosplenomegaly, mild distention.  Skin: Intact without lesions or rashes.  Neurologic: Alert and oriented x 3.  Psych: Normal affect. Extremities: No clubbing or cyanosis.  HEENT: Normal.   Assessment/Plan:  1. RV failure/chronic diastolic CHF: ?RV failure due to lung disease +/- mitral stenosis.  Echo in 3/21 showed LV EF 65-70% with RV moderate-severely dilated and  moderate dysfunction, D-shaped septum, the mitral valve is severely calcified though mean gradient was only about 5 mmHg suggesting mild to moderate mitral stenosis (pressure half-time does not appear short), mild AS. Mitral stenosis appeared moderate by 3/21 cath but only mild-moderate by TEE and echo in 3/21.  During 3/21 admission, she required transient dobutamine for RV support but was titrated off.  She has done quite well recently, NYHA class II.  On exam and by REDS clip today, she is not volume overloaded.  - Continue torsemide 20 mg daily. BMET today.    2. Pulmonary hypertension: There was concern for group 3 PH but RHC/LHC in 3/21 suggested primarily pulmonary venous hypertension due to diastolic dysfunction and ?significant mitral stenosis (mild-moderate by TEE/TTE, moderate by RHC/LHC).  - I do not think that she would be a candidate for selective pulmonary vasodilators.  3. Mitral stenosis: Suspect rheumatic.  The valve looks severely calcified and restricted, but hemodynamics by echo in 3/21 were not that impressive. Mean gradient 5 mmHg and MVA 2.66 cm^2 by PHT suggested no more than mild mitral stenosis. However, cath in 3/21 was suggestive of moderate mitral stenosis with mean gradient 11.5 mmHg and MVA 1.67 cm^2 (cath was done with atrial flutter/RVR however).  TEE was done 3/21 while in NSR and looked like mild-moderate MS with mean gradient 5 mmHg and MVA 2.2 cm^2.  - She does not appear to be a valvuloplasty candidate due to her degree of MV calcification, and she was thought to be a poor surgical candidate due to poor functional capacity.  However, now seems to be doing much better and not sure that surgery would be out of the questions.  Will arrange for repeat echo at followup to reassess the mitral valve.  4. Atrial flutter: TEE-guided DCCV 3/17, now in NSR.  - Continue amiodarone 200 mg daily, check LFTs and TSH today.  She will need regular eye exam.  - Continue apixaban  5.  CKD stage 3: BMET today.  6. ILD: Patient has been followed by pulmonary for ILD noted on prior CT.  She has also had hilar/mediastinal adenopathy with some concern for possible sarcoidosis. She is not on oxygen currently.  - She needs to followup with pulmonary for further workup.   7. HTN: BP controlled on amlodipine 5 mg daily.  8. COPD: Prior smoker.  Suspect there is a component of COPD in addition to ILD.   [name] 11/11/2019

## 2019-11-11 NOTE — Patient Instructions (Addendum)
Labs done today, your results will be available in MyChart, we will contact you for abnormal readings.  Please call our office in May 2022 to schedule your follow up appointment and echocardiogram  Your physician has requested that you have an echocardiogram. Echocardiography is a painless test that uses sound waves to create images of your heart. It provides your doctor with information about the size and shape of your heart and how well your heart's chambers and valves are working. This procedure takes approximately one hour. There are no restrictions for this procedure.  If you have any questions or concerns before your next appointment please send [place] a message through [place] or call our office at [phone].    TO LEAVE A MESSAGE FOR THE NURSE SELECT OPTION 2, PLEASE LEAVE A MESSAGE INCLUDING: . YOUR NAME . DATE OF BIRTH . CALL BACK NUMBER . REASON FOR CALL**this is important as we prioritize the call backs  YOU WILL RECEIVE A CALL BACK THE SAME DAY AS LONG AS YOU CALL BEFORE 4:00 PM

## 2019-11-11 NOTE — Progress Notes (Signed)
ReDS Vest / Clip - 11/11/19 1200      ReDS Vest / Clip   Station Marker A    Ruler Value 32    ReDS Value Range Low volume    ReDS Actual Value 29

## 2019-11-15 ENCOUNTER — Telehealth (HOSPITAL_COMMUNITY): Payer: Self-pay

## 2019-11-15 NOTE — Telephone Encounter (Signed)
Tried calling patient, no answer,unable to leave message. Will try again later.  Letter mailed to address on file

## 2019-11-15 NOTE — Telephone Encounter (Signed)
-----  Message from Larey Dresser, MD sent at 11/11/2019  3:09 PM EDT ----- Increase total daily K by 20 mEq.  In 10 days, repeat CMET (her LFTs are mildly elevated).

## 2019-11-26 ENCOUNTER — Other Ambulatory Visit: Payer: Self-pay

## 2019-11-26 ENCOUNTER — Ambulatory Visit: Payer: Medicare HMO | Admitting: Nurse Practitioner

## 2019-11-26 ENCOUNTER — Encounter: Payer: Self-pay | Admitting: Nurse Practitioner

## 2019-11-26 VITALS — BP 127/68 | HR 69 | Ht 63.0 in | Wt 166.0 lb

## 2019-11-26 DIAGNOSIS — E782 Mixed hyperlipidemia: Secondary | ICD-10-CM

## 2019-11-26 DIAGNOSIS — E1165 Type 2 diabetes mellitus with hyperglycemia: Secondary | ICD-10-CM | POA: Diagnosis not present

## 2019-11-26 DIAGNOSIS — I10 Essential (primary) hypertension: Secondary | ICD-10-CM | POA: Diagnosis not present

## 2019-11-26 LAB — POCT GLYCOSYLATED HEMOGLOBIN (HGB A1C): Hemoglobin A1C: 7 % — AB (ref 4.0–5.6)

## 2019-11-26 MED ORDER — INSULIN ASPART 100 UNIT/ML ~~LOC~~ SOLN: 8.0000 [IU] | Freq: Three times a day (TID) | SUBCUTANEOUS | 2 refills | Status: AC

## 2019-11-26 MED ORDER — INSULIN DETEMIR 100 UNIT/ML ~~LOC~~ SOLN: 25.0000 [IU] | Freq: Every day | SUBCUTANEOUS | 2 refills | Status: AC

## 2019-11-26 NOTE — Progress Notes (Signed)
11/26/2019   Endocrinology follow-up note  Subjective:    Patient ID: Ellen Jordan, female    DOB: 09-15-1945, PCP [name], MD   Past Medical History:  Diagnosis Date  . AKI (acute kidney injury) ([place]) 03/18/2019  . Allergic rhinitis   . Allergy   . Anxiety   . Atrial fibrillation ([place])   . Carpal tunnel syndrome    left  . Cataract   . Depression   . Diabetic neuropathy ([place])   . Dyspnea   . Essential hypertension   . GERD (gastroesophageal reflux disease)   . Mixed hyperlipidemia   . Normocytic anemia 12/18/2018  . PAF (paroxysmal atrial fibrillation) ([place])    a. s/p TEE cardioversion March 2017. b. Recurrence by event monitor 04/2016.  Marland Kitchen Pneumonia   . PONV (postoperative nausea and vomiting)    after previous bronchoscopy  . RBBB   . Restless leg syndrome   . Type 2 diabetes mellitus ([place])   . Uterine cancer [facility])    Past Surgical History:  Procedure Laterality Date  . ABDOMINAL HYSTERECTOMY     uterine cancer  . BACK SURGERY    . CARDIOVERSION N/A 03/11/2015   Procedure: CARDIOVERSION;  Surgeon: [name], MD;  Location: [place];  Service: Cardiovascular;  Laterality: N/A;  . CARDIOVERSION N/A 03/27/2019   Procedure: CARDIOVERSION;  Surgeon: Larey Dresser, MD;  Location: [facility] ENDOSCOPY;  Service: Cardiovascular;  Laterality: N/A;  . CHOLECYSTECTOMY    . EYE SURGERY     cataracts  . FLEXIBLE BRONCHOSCOPY N/A 07/30/2018   Procedure: FLEXIBLE BRONCHOSCOPY;  Surgeon: Sinda Du, MD;  [facility];  Service: Cardiopulmonary;  Laterality: N/A;  . RIGHT AND LEFT HEART CATH N/A 03/25/2019   Procedure: RIGHT AND LEFT HEART CATH;  Surgeon: Larey Dresser, MD;  Location: [place] CV LAB;  Service: Cardiovascular;  Laterality: N/A;  . SPINE SURGERY    . TEE WITHOUT CARDIOVERSION N/A 03/11/2015   Procedure: TRANSESOPHAGEAL ECHOCARDIOGRAM (TEE);  Surgeon: [name], MD;  Location: [place];  Service: Cardiovascular;  Laterality:  N/A;  . TEE WITHOUT CARDIOVERSION N/A 03/27/2019   Procedure: TRANSESOPHAGEAL ECHOCARDIOGRAM (TEE);  Surgeon: Larey Dresser, MD;  Location: [facility] ENDOSCOPY;  Service: Cardiovascular;  Laterality: N/A;  . VIDEO BRONCHOSCOPY WITH ENDOBRONCHIAL ULTRASOUND N/A 12/19/2018   Procedure: VIDEO BRONCHOSCOPY WITH ENDOBRONCHIAL ULTRASOUND;  Surgeon: [name], MD;  Location: [place];  Service: Thoracic;  Laterality: N/A;   Social History   Socioeconomic History  . Marital status: Married    Spouse name: earl  . Number of children: 2  . Years of education: 41  . Highest education level: Not on file  Occupational History  . Occupation: retired  Tobacco Use  . Smoking status: Former Smoker    Packs/day: 1.00    Years: 50.00    Pack years: 50.00    Types: Cigarettes    Start date: 01/10/1961    Quit date: 01/10/2005    Years since quitting: 14.8  . Smokeless tobacco: Former Systems developer    Quit date: 01/10/2005  Vaping Use  . Vaping Use: Never used  Substance and Sexual Activity  . Alcohol use: No    Alcohol/week: 0.0 standard drinks  . Drug use: No  . Sexual activity: Not Currently    Partners: Male  Other Topics Concern  . Not on file  Social History Narrative   Moved to this area in 76 from [place].    Married for  over 50 years. Has two children. Lives with husband [name] (earl)   Likes to read   Retired from [employer].   Enjoys time with family, beach, read, exericse.   Walk dog.   Eats all food groups.   Wear seatbelt.   Wear sunscreen.   Social Determinants of Health   Financial Resource Strain:   . Difficulty of Paying Living Expenses: Not on file  Food Insecurity:   . Worried About Charity fundraiser in the Last Year: Not on file  . Ran Out of Food in the Last Year: Not on file  Transportation Needs:   . Lack of Transportation (Medical): Not on file  . Lack of Transportation (Non-Medical): Not on file  Physical Activity:   . Days of Exercise per Week: Not on file  .  Minutes of Exercise per Session: Not on file  Stress:   . Feeling of Stress : Not on file  Social Connections:   . Frequency of Communication with Friends and Family: Not on file  . Frequency of Social Gatherings with Friends and Family: Not on file  . Attends Religious Services: Not on file  . Active Member of Clubs or Organizations: Not on file  . Attends Archivist Meetings: Not on file  . Marital Status: Not on file   Outpatient Encounter Medications as of 11/26/2019  Medication Sig  . Accu-Chek FastClix Lancets MISC USE TO TEST BLOOD GLUCOSE FOUR TIMES DAILY (Patient taking differently: 1 Device by Other route in the morning, at noon, in the evening, and at bedtime. )  . ACCU-CHEK GUIDE test strip USE AS INSTRUCTED FOUR TIMES DAILY  . albuterol (PROVENTIL HFA;VENTOLIN HFA) 108 (90 Base) MCG/ACT inhaler Inhale 2 puffs into the lungs every 6 (six) hours as needed for wheezing or shortness of breath.  . Alcohol Swabs (B-D SINGLE USE SWABS REGULAR) PADS USE FOUR TIMES DAILY  . amiodarone (PACERONE) 200 MG tablet Take 400 mg po twice daily x 5 days then   take 200 mg po twice daily x 10 days then Take 200 mg po daily (Patient taking differently: Take 200 mg by mouth daily. Take 400 mg po twice daily x 5 days then   take 200 mg po twice daily x 10 days then Take 200 mg po daily)  . amLODipine (NORVASC) 5 MG tablet Take 1 tablet (5 mg total) by mouth daily.  Marland Kitchen atenolol (TENORMIN) 25 MG tablet   . atorvastatin (LIPITOR) 40 MG tablet TAKE 1 TABLET EVERY DAY  . b complex vitamins tablet Take 1 tablet by mouth daily.  . benzonatate (TESSALON) 200 MG capsule Take 200 mg by mouth 2 (two) times daily as needed.  . Biotin 5000 MCG CAPS Take 1 capsule by mouth daily.  . Blood Glucose Monitoring Suppl (ACCU-CHEK GUIDE) w/Device KIT 1 each by Does not apply route 4 (four) times daily.  . chlorpheniramine (EQ CHLORTABS) 4 MG tablet Take 4 mg by mouth 2 (two) times daily as needed for  allergies.  . cholecalciferol (VITAMIN D) 1000 units tablet Take 1,000 Units by mouth daily.  . diphenhydrAMINE (BENADRYL) 25 MG tablet Take 25 mg by mouth daily as needed for allergies.   Marland Kitchen ELIQUIS 5 MG TABS tablet TAKE 1 TABLET TWICE DAILY (Patient taking differently: Take 5 mg by mouth 2 (two) times daily. )  . insulin aspart (NOVOLOG) 100 UNIT/ML injection Inject 8-11 Units into the skin 3 (three) times daily with meals.  . insulin detemir (LEVEMIR)  100 UNIT/ML injection Inject 0.25 mLs (25 Units total) into the skin at bedtime.  Marland Kitchen ipratropium-albuterol (DUONEB) 0.5-2.5 (3) MG/3ML SOLN Take 3 mLs by nebulization every 6 (six) hours as needed (wheezing/ shortness of breath).  . liraglutide (VICTOZA) 18 MG/3ML SOPN Inject 0.3 mLs (1.8 mg total) into the skin daily.  . Misc Natural Products (ESTROVEN + ENERGY MAX STRENGTH) TABS Take 1 tablet by mouth daily.  Marland Kitchen omeprazole (PRILOSEC) 20 MG capsule TAKE 1 CAPSULE EVERY DAY (Patient taking differently: Take 20 mg by mouth daily. )  . sertraline (ZOLOFT) 100 MG tablet TAKE 1 TABLET (100 MG TOTAL) BY MOUTH DAILY.  Marland Kitchen torsemide (DEMADEX) 20 MG tablet Take 1 tablet (20 mg total) by mouth daily.  . vitamin E (VITAMIN E) 400 UNIT capsule Take 400 Units by mouth daily.  . [DISCONTINUED] insulin aspart (NOVOLOG) 100 UNIT/ML injection Inject 8-14 Units into the skin 3 (three) times daily with meals.  . [DISCONTINUED] insulin detemir (LEVEMIR) 100 UNIT/ML injection Inject 0.5 mLs (50 Units total) into the skin at bedtime.  . Continuous Blood Gluc Sensor (FREESTYLE LIBRE 14 DAY SENSOR) MISC Inject 1 each into the skin every 14 (fourteen) days. Use as directed. (Patient not taking: Reported on 11/26/2019)  . fluticasone furoate-vilanterol (BREO ELLIPTA) 100-25 MCG/INH AEPB Inhale 1 puff into the lungs daily. (Patient not taking: Reported on 11/26/2019)   Facility-Administered Encounter Medications as of 11/26/2019  Medication  . lactated ringers infusion    ALLERGIES: Allergies  Allergen Reactions  . Penicillins Hives    Has patient had a PCN reaction causing immediate rash, facial/tongue/throat swelling, SOB or lightheadedness with hypotension: Yes Has patient had a PCN reaction causing severe rash involving mucus membranes or skin necrosis: No Has patient had a PCN reaction that required hospitalization No Has patient had a PCN reaction occurring within the last 10 years: No If all of the above answers are "NO", then may proceed with Cephalosporin use.    VACCINATION STATUS: Immunization History  Administered Date(s) Administered  . Influenza, High Dose Seasonal PF 11/14/2018  . Influenza,inj,Quad PF,6+ Mos 11/04/2016  . Pneumococcal Conjugate-13 11/30/2016  . Pneumococcal Polysaccharide-23 06/07/2017  . Td 06/07/2017    Diabetes She presents for her follow-up diabetic visit. She has type 2 diabetes mellitus. Onset time: She was diagnosed at approximate age of 36 years. Her disease course has been stable. There are no hypoglycemic associated symptoms. Pertinent negatives for hypoglycemia include no confusion, headaches, pallor or seizures. There are no diabetic associated symptoms. Pertinent negatives for diabetes include no chest pain, no polydipsia, no polyphagia and no polyuria. There are no hypoglycemic complications. Symptoms are stable. There are no diabetic complications. Risk factors for coronary artery disease include diabetes mellitus, dyslipidemia, hypertension, obesity, sedentary lifestyle, tobacco exposure and post-menopausal. Current diabetic treatment includes intensive insulin program. She is compliant with treatment most of the time. Her weight is decreasing steadily. She is following a generally unhealthy diet. When asked about meal planning, she reported none. She has not had a previous visit with a dietitian. She rarely participates in exercise. Her home blood glucose trend is fluctuating minimally. Her breakfast blood  glucose range is generally 130-140 mg/dl. Her lunch blood glucose range is generally 180-200 mg/dl. Her dinner blood glucose range is generally 140-180 mg/dl. Her bedtime blood glucose range is generally 140-180 mg/dl. (She presents today with her logs, no meter, showing near target fasting and postprandial glycemic profile.  Her POCT A1c today is 7%, increasing slightly from last visit  of 6.7%, but an acceptable A1c target for her.  She has been adjusting her Levemir dose based on her bedtime glucose readings.  She does report some mild, rare fasting hypoglycemia.) An ACE inhibitor/angiotensin II receptor blocker is not being taken. She does not see a podiatrist.Eye exam is current.  Hyperlipidemia This is a chronic problem. The current episode started more than 1 year ago. The problem is controlled. Recent lipid tests were reviewed and are normal. Exacerbating diseases include chronic renal disease, diabetes and obesity. There are no known factors aggravating her hyperlipidemia. Pertinent negatives include no chest pain, leg pain, myalgias or shortness of breath. Current antihyperlipidemic treatment includes statins. The current treatment provides moderate improvement of lipids. Compliance problems include adherence to diet and adherence to exercise.  Risk factors for coronary artery disease include dyslipidemia, diabetes mellitus, hypertension, a sedentary lifestyle, obesity and post-menopausal.  Hypertension This is a chronic problem. The current episode started more than 1 year ago. The problem has been gradually improving since onset. The problem is controlled. Pertinent negatives include no chest pain, headaches, palpitations or shortness of breath. There are no associated agents to hypertension. Risk factors for coronary artery disease include diabetes mellitus, dyslipidemia, obesity, sedentary lifestyle, smoking/tobacco exposure and post-menopausal state. Past treatments include ACE inhibitors and  diuretics. The current treatment provides moderate improvement. There are no compliance problems.  Hypertensive end-organ damage includes kidney disease. Identifiable causes of hypertension include chronic renal disease.     Review of systems  Constitutional: + Minimally fluctuating body weight,  current Body mass index is 29.41 kg/m. , no fatigue, no subjective hyperthermia, no subjective hypothermia Eyes: no blurry vision, no xerophthalmia ENT: no sore throat, no nodules palpated in throat, no dysphagia/odynophagia, no hoarseness Cardiovascular: no chest pain, no shortness of breath, no palpitations, no leg swelling Respiratory: no cough, no shortness of breath Gastrointestinal: no nausea/vomiting/diarrhea Musculoskeletal: no muscle/joint aches Skin: no rashes, no hyperemia Neurological: no tremors, no numbness, no tingling, no dizziness Psychiatric: no depression, no anxiety    Objective:    BP 127/68 (BP Location: Left Arm, Patient Position: Sitting)   Pulse 69   Ht _0  (1.6 m)   Wt 166 lb (75.3 kg)   BMI 29.41 kg/m   Wt Readings from Last 3 Encounters:  11/26/19 166 lb (75.3 kg)  11/11/19 166 lb 9.6 oz (75.6 kg)  05/21/19 167 lb 12.8 oz (76.1 kg)    BP Readings from Last 3 Encounters:  11/26/19 127/68  11/11/19 120/70  05/21/19 (!) 146/76    Physical Exam- Limited  Constitutional:  Body mass index is 29.41 kg/m. , not in acute distress, normal state of mind Eyes:  EOMI, no exophthalmos Neck: Supple Thyroid: No gross goiter Cardiovascular: RRR, no murmers, rubs, or gallops, no edema Respiratory: Adequate breathing efforts, no crackles, rales, rhonchi, or wheezing Musculoskeletal: no gross deformities, strength intact in all four extremities, no gross restriction of joint movements Skin:  no rashes, no hyperemia Neurological: no tremor with outstretched hands  Foot exam:   No rashes, ulcers, cuts, calluses, onychodystrophy.   Good pulses bilat.  Good  sensation to 10 g monofilament bilat.  + dry spots to bilateral feet  + varicose veins bilaterally    CMP Latest Ref Rng & Units 11/11/2019 03/28/2019 03/27/2019  Glucose 70 - 99 mg/dL 238(H) 159(H) 123(H)  BUN 8 - 23 mg/dL 27(H) 28(H) 28(H)  Creatinine 0.44 - 1.00 mg/dL 1.49(H) 1.74(H) 1.56(H)  Sodium 135 - 145 mmol/L 139 139 139  Potassium 3.5 - 5.1 mmol/L 3.2(L) 3.7 3.3(L)  Chloride 98 - 111 mmol/L 100 95(L) 94(L)  CO2 22 - 32 mmol/L _0 Calcium 8.9 - 10.3 mg/dL 9.1 9.4 9.5  Total Protein 6.5 - 8.1 g/dL 7.3 - -  Total Bilirubin 0.3 - 1.2 mg/dL 0.7 - -  Alkaline Phos 38 - 126 U/L 82 - -  AST 15 - 41 U/L 71(H) - -  ALT 0 - 44 U/L 72(H) - -     Lipid Panel     Component Value Date/Time   CHOL 115 11/11/2019 1239   TRIG 140 11/11/2019 1239   HDL 25 (L) 11/11/2019 1239   CHOLHDL 4.6 11/11/2019 1239   VLDL 28 11/11/2019 1239   LDLCALC 62 11/11/2019 1239   LDLCALC 63 06/16/2017 0959   Results for KYRAH, SCHIRO (MRN [id number]) as of 05/21/2019 12:30  Ref. Range 07/23/2018 00:00 12/18/2018 11:02 03/18/2019 05:05  Hemoglobin A1C Latest Ref Range: 4.8 - 5.6 % 6.4 7.2 (H) 6.7 (H)    Assessment & Plan:   1. Uncontrolled type 2 diabetes mellitus without complication, with long-term current use of insulin   - She remains at a high risk for more acute and chronic complications of diabetes which include CAD, CVA, CKD, retinopathy, and neuropathy. These are all discussed in detail with the patient.  She presents today with her logs, no meter, showing near target fasting and postprandial glycemic profile.  Her POCT A1c today is 7%, increasing slightly from last visit of 6.7%, but an acceptable A1c target for her.  She has been adjusting her Levemir dose (between 25-50 units) based on her bedtime glucose readings.  She does report some mild, rare fasting hypoglycemia.   Recent labs reviewed.  - Nutritional counseling repeated at each appointment due to patients tendency to fall back  in to old habits.  - The patient admits there is a room for improvement in their diet and drink choices. -  Suggestion is made for the patient to avoid simple carbohydrates from their diet including Cakes, Sweet Desserts / Pastries, Ice Cream, Soda (diet and regular), Sweet Tea, Candies, Chips, Cookies, Sweet Pastries,  Store Bought Juices, Alcohol in Excess of  1-2 drinks a day, Artificial Sweeteners, Coffee Creamer, and "Sugar-free" Products. This will help patient to have stable blood glucose profile and potentially avoid unintended weight gain.   - I encouraged the patient to switch to  unprocessed or minimally processed complex starch and increased protein intake (animal or plant source), fruits, and vegetables.   - Patient is advised to stick to a routine mealtimes to eat 3 meals  a day and avoid unnecessary snacks ( to snack only to correct hypoglycemia).  - I have approached patient with the following individualized plan to manage diabetes and patient agrees.  -Based on her stable glycemic profile on a lower dose of Levemir, I advised her to continue on her basal insulin at same dose consistently every evening at 25 units.  She is advised to continue Novolog 8-11 units TID with meals if glucose is above 90 and she is eating.  Specific instructions on how to titrate insulin dose based on glucose readings given to patient in writing.  She is also advised to continue Victoza 1.8 mg SQ daily.  -She is encouraged to continue monitoring blood glucose 4 times daily, before meals and before bed, and to call the clinic if she has readings less than 70 or greater than 300 for 3  tests in a row.   - Patient specific target  for A1c; LDL, HDL, Triglycerides,were discussed in detail.  2) BP/HTN: -Her blood pressure is controlled to target.  She is advised to continue Norvasc 5 mg po daily and Torsemide 20 mg po daily.  3) Lipids/HPL:  Her most recent lipid panel from 11/11/19 shows controlled LDL at  62.  She is advised to continue Lipitor 40 mg po daily at bedtime.  Side effects and precautions discussed with her.  4)  Weight/Diet:  Her Body mass index is 29.41 kg/m.-she is not a candidate for major weight loss.  She has declined CDE consult, exercise, and carbohydrates information provided.  5) Chronic Care/Health Maintenance: -Patient is on Statin medications and encouraged to continue to follow up with Ophthalmology, Podiatrist at least yearly or according to recommendations, and advised to  stay away from smoking. I have recommended yearly flu vaccine and pneumonia vaccination at least every 5 years; moderate intensity exercise for up to 150 minutes weekly; and  sleep for at least 7 hours a day.    - I advised patient to maintain close follow up with [name], MD for primary care needs.  - Time spent on this patient care encounter:  35 min, of which > 50% was spent in  counseling and the rest reviewing her blood glucose logs , discussing her hypoglycemia and hyperglycemia episodes, reviewing her current and  previous labs / studies  ( including abstraction from other facilities) and medications  doses and developing a  long term treatment plan and documenting her care.   Please refer to Patient Instructions for Blood Glucose Monitoring and Insulin/Medications Dosing Guide"  in media tab for additional information. Please  also refer to " Patient Self Inventory" in the Media  tab for reviewed elements of pertinent patient history.  Ellen Jordan participated in the discussions, expressed understanding, and voiced agreement with the above plans.  All questions were answered to her satisfaction. she is encouraged to contact clinic should she have any questions or concerns prior to her return visit.   Follow up plan: -Return in about 6 months (around 05/25/2020) for Diabetes follow up- A1c and urine micro in office, Bring glucometer and logs, No previsit labs.  Rayetta Pigg,  [facility] [address] [place], [place] [address] Phone: [phone] Fax: [phone]  11/26/2019, 11:31 AM

## 2019-11-26 NOTE — Patient Instructions (Signed)

## 2019-11-29 ENCOUNTER — Telehealth (HOSPITAL_COMMUNITY): Payer: Self-pay | Admitting: *Deleted

## 2019-11-29 NOTE — Telephone Encounter (Signed)
Pt called stating she received a letter to call our office for lab results. I called pt back no answer and no voicemail set up.

## 2019-12-25 ENCOUNTER — Other Ambulatory Visit: Payer: Self-pay | Admitting: "Endocrinology

## 2019-12-25 ENCOUNTER — Other Ambulatory Visit: Payer: Self-pay | Admitting: Cardiology

## 2020-01-24 ENCOUNTER — Encounter (HOSPITAL_COMMUNITY): Payer: Self-pay | Admitting: Cardiology

## 2020-01-24 ENCOUNTER — Ambulatory Visit (HOSPITAL_BASED_OUTPATIENT_CLINIC_OR_DEPARTMENT_OTHER)
Admission: RE | Admit: 2020-01-24 | Discharge: 2020-01-24 | Disposition: A | Discharge status: Home / Self Care | Source: Ambulatory Visit | Attending: Cardiology | Admitting: Cardiology

## 2020-01-24 ENCOUNTER — Ambulatory Visit (HOSPITAL_COMMUNITY)
Admission: RE | Admit: 2020-01-24 | Discharge: 2020-01-24 | Disposition: A | Discharge status: Home / Self Care | Source: Ambulatory Visit | Attending: Internal Medicine | Admitting: Internal Medicine

## 2020-01-24 ENCOUNTER — Other Ambulatory Visit: Payer: Self-pay

## 2020-01-24 VITALS — BP 140/60 | HR 71 | Wt 165.2 lb

## 2020-01-24 DIAGNOSIS — I130 Hypertensive heart and chronic kidney disease with heart failure and stage 1 through stage 4 chronic kidney disease, or unspecified chronic kidney disease: Secondary | ICD-10-CM | POA: Insufficient documentation

## 2020-01-24 DIAGNOSIS — I340 Nonrheumatic mitral (valve) insufficiency: Secondary | ICD-10-CM | POA: Insufficient documentation

## 2020-01-24 DIAGNOSIS — Z87891 Personal history of nicotine dependence: Secondary | ICD-10-CM | POA: Insufficient documentation

## 2020-01-24 DIAGNOSIS — I342 Nonrheumatic mitral (valve) stenosis: Secondary | ICD-10-CM

## 2020-01-24 DIAGNOSIS — E1122 Type 2 diabetes mellitus with diabetic chronic kidney disease: Secondary | ICD-10-CM | POA: Diagnosis not present

## 2020-01-24 DIAGNOSIS — I480 Paroxysmal atrial fibrillation: Secondary | ICD-10-CM | POA: Diagnosis not present

## 2020-01-24 DIAGNOSIS — I4891 Unspecified atrial fibrillation: Secondary | ICD-10-CM | POA: Insufficient documentation

## 2020-01-24 DIAGNOSIS — N183 Chronic kidney disease, stage 3 unspecified: Secondary | ICD-10-CM | POA: Insufficient documentation

## 2020-01-24 DIAGNOSIS — Z79899 Other long term (current) drug therapy: Secondary | ICD-10-CM | POA: Insufficient documentation

## 2020-01-24 DIAGNOSIS — Z794 Long term (current) use of insulin: Secondary | ICD-10-CM | POA: Insufficient documentation

## 2020-01-24 DIAGNOSIS — J449 Chronic obstructive pulmonary disease, unspecified: Secondary | ICD-10-CM | POA: Diagnosis not present

## 2020-01-24 DIAGNOSIS — I50810 Right heart failure, unspecified: Secondary | ICD-10-CM

## 2020-01-24 DIAGNOSIS — J849 Interstitial pulmonary disease, unspecified: Secondary | ICD-10-CM | POA: Diagnosis not present

## 2020-01-24 DIAGNOSIS — I4510 Unspecified right bundle-branch block: Secondary | ICD-10-CM | POA: Insufficient documentation

## 2020-01-24 DIAGNOSIS — I272 Pulmonary hypertension, unspecified: Secondary | ICD-10-CM | POA: Diagnosis not present

## 2020-01-24 DIAGNOSIS — Z7901 Long term (current) use of anticoagulants: Secondary | ICD-10-CM | POA: Diagnosis not present

## 2020-01-24 DIAGNOSIS — I4892 Unspecified atrial flutter: Secondary | ICD-10-CM | POA: Diagnosis not present

## 2020-01-24 DIAGNOSIS — I5032 Chronic diastolic (congestive) heart failure: Secondary | ICD-10-CM | POA: Diagnosis not present

## 2020-01-24 LAB — COMPREHENSIVE METABOLIC PANEL
ALT: 80 U/L — ABNORMAL HIGH (ref 0–44)
AST: 81 U/L — ABNORMAL HIGH (ref 15–41)
Albumin: 3.5 g/dL (ref 3.5–5.0)
Alkaline Phosphatase: 93 U/L (ref 38–126)
Anion gap: 12 (ref 5–15)
BUN: 16 mg/dL (ref 8–23)
CO2: 27 mmol/L (ref 22–32)
Calcium: 9 mg/dL (ref 8.9–10.3)
Chloride: 100 mmol/L (ref 98–111)
Creatinine, Ser: 1.39 mg/dL — ABNORMAL HIGH (ref 0.44–1.00)
GFR, Estimated: 39 mL/min — ABNORMAL LOW (ref >60)
Glucose, Bld: 232 mg/dL — ABNORMAL HIGH (ref 70–99)
Potassium: 3.3 mmol/L — ABNORMAL LOW (ref 3.5–5.1)
Sodium: 139 mmol/L (ref 135–145)
Total Bilirubin: 0.9 mg/dL (ref 0.3–1.2)
Total Protein: 6.7 g/dL (ref 6.5–8.1)

## 2020-01-24 LAB — CBC
HCT: 37.5 % (ref 36.0–46.0)
Hemoglobin: 12.3 g/dL (ref 12.0–15.0)
MCH: 31.5 pg (ref 26.0–34.0)
MCHC: 32.8 g/dL (ref 30.0–36.0)
MCV: 95.9 fL (ref 80.0–100.0)
Platelets: 159 10*3/uL (ref 150–400)
RBC: 3.91 MIL/uL (ref 3.87–5.11)
RDW: 12.8 % (ref 11.5–15.5)
WBC: 6.5 10*3/uL (ref 4.0–10.5)
nRBC: 0 % (ref 0.0–0.2)

## 2020-01-24 LAB — ECHOCARDIOGRAM COMPLETE
Area-P 1/2: 1.94 cm2
Calc EF: 56.9 %
MV VTI: 1.23 cm2
S' Lateral: 2.5 cm
Single Plane A2C EF: 54.5 %
Single Plane A4C EF: 59.1 %

## 2020-01-24 LAB — BRAIN NATRIURETIC PEPTIDE: B Natriuretic Peptide: 119.9 pg/mL — ABNORMAL HIGH (ref 0.0–100.0)

## 2020-01-24 LAB — TSH: TSH: 3.073 u[IU]/mL (ref 0.350–4.500)

## 2020-01-24 MED ORDER — EMPAGLIFLOZIN 10 MG PO TABS: 10.0000 mg | ORAL_TABLET | Freq: Every day | ORAL | 3 refills | Status: DC

## 2020-01-24 NOTE — Progress Notes (Signed)
ReDS Vest / Clip - 01/24/20 1200      ReDS Vest / Clip   Station Marker A    Ruler Value 29    ReDS Value Range Moderate volume overload    ReDS Actual Value 36

## 2020-01-24 NOTE — Progress Notes (Signed)
Patient is being seen in the [facility]. VS, EKG, and device interrogations performed in the clinic. Dr Aundra Dubin is at home and seeing patients via telemedicine as he is in quarantine.

## 2020-01-24 NOTE — Progress Notes (Signed)
  Echocardiogram 2D Echocardiogram has been performed.  Michiel Cowboy 01/24/2020, 11:44 AM

## 2020-01-24 NOTE — Patient Instructions (Addendum)
Labs done today. We will contact you only if your labs are abnormal.  START Jardiance 44m (1 tablet) by mouth daily.  No other medication changes were made. Please continue all current medications as prescribed.  Your physician recommends that you schedule a follow-up appointment in: 10 days for a lab only appointment and in 1 month with Dr. MAundra Dubin   Please be sure to follow up with your Pulmonologist, Dr. RChase Callersoon  If you have any questions or concerns before your next appointment please send u[place] message through [email or web address] call our office at [phone]    TO LEAVE A MESSAGE FOR THE NURSE SELECT OPTION 2, PLEASE LEAVE A MESSAGE INCLUDING: . YOUR NAME . DATE OF BIRTH . CALL BACK NUMBER . REASON FOR CALL**this is important as we prioritize the call backs  YOU WILL RECEIVE A CALL BACK THE SAME DAY AS LONG AS YOU CALL BEFORE 4:00 PM   Do the following things EVERYDAY: 1) Weigh yourself in the morning before breakfast. Write it down and keep it in a log. 2) Take your medicines as prescribed 3) Eat low salt foods-Limit salt (sodium) to 2000 mg per day.  4) Stay as active as you can everyday 5) Limit all fluids for the day to less than 2 liters   At the [facility] you and your health needs are our priority. As part of our continuing mission to provide you with exceptional heart care, we have created designated Provider Care Teams. These Care Teams include your primary Cardiologist (physician) and Advanced Practice Providers (APPs- Physician Assistants and Nurse Practitioners) who all work together to provide you with the care you need, when you need it.   You may see any of the following providers on your designated Care Team at your next follow up: .Marland KitchenDr DGlori Bickers. Dr DLoralie Champagne. ADarrick Grinder NP . BLyda Jester PA . LAudry Riles PharmD   Please be sure to bring in all your medications bottles to every appointment.

## 2020-01-25 NOTE — Progress Notes (Signed)
Heart Failure TeleHealth Note  Due to national recommendations of social distancing due to [place] 19, Audio/video telehealth visit is felt to be most appropriate for this patient at this time.  See MyChart message from today for patient consent regarding telehealth for [facility]  Date:  01/25/2020   ID:  Ellen Jordan, DOB [birth year], MRN [id number]  Location: Home  Provider location: [facility] Type of Visit: Established patient   PCP:  [name], MD  Cardiologist:  Rozann Lesches, MD Primary HF: Dr. Aundra Dubin   History of Present Illness: Ellen Jordan is a 75 y.o. female who presents via audio/video conferencing for a telehealth visit today.     she denies symptoms worrisome for COVID 19.   Patient has history of atrial flutter, diabetes, mitral stenosis, COPD/ILD, and chronic diastolic CHF with prominent RV failure.  She has been followed by Dr. Chase Caller in the ILD clinic and workup was ongoing.  She is a former smoker and is suspected to have a combination of COPD and ILD.  Of note, she has been found to have mediastinal and hilar adenopathy.   She was admitted in 3/21 with CHF/volume overload and atrial flutter.  Echo this admission showed LV EF 65-70% with RV moderate-severely dilated and moderate dysfunction, D-shaped septum, the mitral valve is severely calcified though mean gradient is only about 5 mmHg suggesting mild to moderate mitral stenosis (pressure half-time does not appear short), mild AS. The patient was admitted markedly volume overloaded with RV failure. She also had mitral stenosis which visually looked significant but measured only mild-moderate by mean gradient and pressure half-time on echo, this was confirmed by TEE (mild-moderate MS). She was initially oliguric with creatinine up to 4.27.  AKI resolved and she diuresed with the addition of dobutamine for RV support. RHC/LHC this admit showed mildly elevated PCWP with moderate  primarily pulmonary venous hypertension. Mitral stenosis appeared moderate by cath (in setting of atrial flutter with RVR) but only mild-moderate by TEE and echo.  She had TEE-guided DCCV on 03/27/19.  She was eventually discharged home after full diuresis and weaning off dobutamine.   Echo today was reviewed, EF 60-65%, mild RV dilation with normal RV systolic function, calcified mitral valve with mean gradient 6 mmHg (mild-moderate MS), PASP 55 mmHg with normal IVC size.   She is in NSR today.  She is not short of breath walking around her house and was not short of breath walking into the office today.  She reports "occasional labored breathing."  Weight stable.  No palpitations.  She had an episode of chest pain yesterday but generally does not have chest pain.  No lightheadedness.    REDS clip 36%  Labs (3/21): K 3.7, creatinine 1.74, hgb 11.1 Labs (11/21): LDL 62, HDL 25, TSH normal, hgb 13.1, K 3.2, creatinine 1.49  ECG (personally reviewed): NSR, RBBB  PMH: 1. Type 2 diabetes. 2. Atrial flutter: S/p DCCV 3/21.  3. CKD stage 3 4. Depression 5. HTN 6. H/o uterine cancer 7. Mitral stenosis: Suspect rheumatic.   - Echo (3/21): Mild mitral stenosis mean gradient 4.5.  - LHC/RHC (3/21): mitral valve mean gradient 11.5 mmHg, MVA 1.67 cm^2 => moderate MS but in atrial fibrillation with RVR.  - TEE (3/21): Mild-moderate MS with mean gradient 5 mmHg and MVA 2.2 cm^2 => in NSR.  - Echo (1/22) with mild-moderate MS (mean gradient 6 mmHg).  8. Chronic diastolic CHF with prominent RV failure:  Echo (3/21) with EF 60-65%, D-shaped septum, moderate-severe RV enlargement with mildly decreased RV systolic function, PASP 41, rheumatic MV with mean gradient 4.5 (mild MS), mild AS.  - RHC/LHC (3/21): mean RA 9, PA 54/34 mean 34, mean PCWP 18, CI 2.59, PVR 3.27 WU.  - Echo (1/22): EF 60-65%, mild RV dilation with normal RV systolic function, calcified mitral valve with mean gradient 6 mmHg (mild-moderate  MS), PASP 55 mmHg with normal IVC size.  9. Pulmonary venous HTN: See 3/21 RHC above.  10. Chronic lung disease:  Suspected to have combination of COPD and ILD.  Of note, has had hilar/mediastinal adenopathy.  Follows with pulmonary.  11. Aortic stenosis: Mild on 3/21 echo.   Social History   Socioeconomic History  . Marital status: Married    Spouse name: earl  . Number of children: 2  . Years of education: 20  . Highest education level: Not on file  Occupational History  . Occupation: retired  Tobacco Use  . Smoking status: Former Smoker    Packs/day: 1.00    Years: 50.00    Pack years: 50.00    Types: Cigarettes    Start date: 01/10/1961    Quit date: 01/10/2005    Years since quitting: 15.0  . Smokeless tobacco: Former Systems developer    Quit date: 01/10/2005  Vaping Use  . Vaping Use: Never used  Substance and Sexual Activity  . Alcohol use: No    Alcohol/week: 0.0 standard drinks  . Drug use: No  . Sexual activity: Not Currently    Partners: Male  Other Topics Concern  . Not on file  Social History Narrative   Moved to this area in 26 from [place].    Married for over 50 years. Has two children. Lives with husband [name] (earl)   Likes to read   Retired from [employer].   Enjoys time with family, beach, read, exericse.   Walk dog.   Eats all food groups.   Wear seatbelt.   Wear sunscreen.   Social Determinants of Health   Financial Resource Strain: Not on file  Food Insecurity: Not on file  Transportation Needs: Not on file  Physical Activity: Not on file  Stress: Not on file  Social Connections: Not on file  Intimate Partner Violence: Not on file   Family History  Problem Relation Age of Onset  . Heart disease Mother 38  . Hyperlipidemia Mother   . Diabetes Brother   . COPD Father 64       emphysema  . Hypertension Father   . Hyperlipidemia Father   . Diabetes Daughter   . AAA (abdominal aortic aneurysm) Son   . Diabetes Brother   . Diabetes Brother    . Diabetes Brother    ROS: All systems reviewed and negative except as per HPI.   Current Outpatient Medications  Medication Sig Dispense Refill  . Accu-Chek FastClix Lancets MISC TEST BLOOD GLUCOSE FOUR TIMES DAILY 408 each 2  . ACCU-CHEK GUIDE test strip USE AS INSTRUCTED FOUR TIMES DAILY 400 strip 1  . albuterol (PROVENTIL HFA;VENTOLIN HFA) 108 (90 Base) MCG/ACT inhaler Inhale 2 puffs into the lungs every 6 (six) hours as needed for wheezing or shortness of breath. 1 Inhaler 0  . Alcohol Swabs (B-D SINGLE USE SWABS REGULAR) PADS USE FOUR TIMES DAILY 400 each 2  . amiodarone (PACERONE) 200 MG tablet Take 200 mg by mouth daily.    Marland Kitchen amLODipine (NORVASC) 5 MG tablet Take 1 tablet (  5 mg total) by mouth daily. 30 tablet 2  . atenolol (TENORMIN) 25 MG tablet     . atorvastatin (LIPITOR) 40 MG tablet TAKE 1 TABLET EVERY DAY 90 tablet 3  . b complex vitamins tablet Take 1 tablet by mouth daily.    . benzonatate (TESSALON) 200 MG capsule Take 200 mg by mouth 2 (two) times daily as needed.    . Biotin 5000 MCG CAPS Take 1 capsule by mouth daily.    . Blood Glucose Monitoring Suppl (ACCU-CHEK GUIDE) w/Device KIT 1 each by Does not apply route 4 (four) times daily. 1 kit 0  . chlorpheniramine (CHLOR-TRIMETON) 4 MG tablet Take 4 mg by mouth 2 (two) times daily as needed for allergies.    . cholecalciferol (VITAMIN D) 1000 units tablet Take 1,000 Units by mouth daily.    . diphenhydrAMINE (BENADRYL) 25 MG tablet Take 25 mg by mouth daily as needed for allergies.     Marland Kitchen ELIQUIS 5 MG TABS tablet TAKE 1 TABLET TWICE DAILY 180 tablet 3  . empagliflozin (JARDIANCE) 10 MG TABS tablet Take 1 tablet (10 mg total) by mouth daily before breakfast. 90 tablet 3  . insulin aspart (NOVOLOG) 100 UNIT/ML injection Inject 8-11 Units into the skin 3 (three) times daily with meals. 50 mL 2  . insulin detemir (LEVEMIR) 100 UNIT/ML injection Inject 0.25 mLs (25 Units total) into the skin at bedtime. 60 mL 2  .  ipratropium-albuterol (DUONEB) 0.5-2.5 (3) MG/3ML SOLN Take 3 mLs by nebulization every 6 (six) hours as needed (wheezing/ shortness of breath).    . liraglutide (VICTOZA) 18 MG/3ML SOPN Inject 0.3 mLs (1.8 mg total) into the skin daily. 36 mL 2  . Misc Natural Products (ESTROVEN + ENERGY MAX STRENGTH) TABS Take 1 tablet by mouth daily.    Marland Kitchen omeprazole (PRILOSEC) 20 MG capsule Take 1 capsule (20 mg total) by mouth daily. Must make apt with Cardiologist for further refills. 30 capsule 0  . sertraline (ZOLOFT) 100 MG tablet TAKE 1 TABLET (100 MG TOTAL) BY MOUTH DAILY. 90 tablet 0  . torsemide (DEMADEX) 20 MG tablet Take 1 tablet (20 mg total) by mouth daily. 30 tablet 2  . vitamin E 180 MG (400 UNITS) capsule Take 400 Units by mouth daily.    Marland Kitchen zinc gluconate 50 MG tablet Take 50 mg by mouth daily.     No current facility-administered medications for this encounter.   Facility-Administered Medications Ordered in Other Encounters  Medication Dose Route Frequency Provider Last Rate Last Admin  . lactated ringers infusion   Intravenous Continuous PRN Kathryne Hitch, CRNA   New Bag at 12/18/18 1133   BP 140/60   Pulse 71   Wt 74.9 kg (165 lb 3.2 oz)   SpO2 92%   BMI 29.26 kg/m  Exam:  (Video/Tele Health Call; Exam is subjective and or/visual.) General:  Speaks in full sentences. No resp difficulty. Neck: JVP 8-9 cm Lungs: Normal respiratory effort with conversation.  Abdomen: Non-distended per patient report Extremities: Pt denies edema. Neuro: Alert & oriented x 3.   Assessment/Plan:  1. RV failure/chronic diastolic CHF: ?RV failure due to lung disease +/- mitral stenosis.  Echo in 3/21 showed LV EF 65-70% with RV moderate-severely dilated and moderate dysfunction, D-shaped septum, the mitral valve is severely calcified though mean gradient was only about 5 mmHg suggesting mild to moderate mitral stenosis (pressure half-time does not appear short), mild AS. Mitral stenosis appeared  moderate by 3/21 cath but  only mild-moderate by TEE and echo in 3/21.  During 3/21 admission, she required transient dobutamine for RV support but was titrated off.  Echo was done today, EF 60-65%, mild RV dilation with normal function, mild-moderate MS.  On exam and by REDS clip today, she is mildly volume overloaded. NYHA class II-III symptoms.  - Increase torsemide to 40 mg daily alternating with 20 mg daily.  BMET today and in 10 days.  - Add Jardiance 10 mg daily with diastolic CHF and CKD.  2. Pulmonary hypertension: There was concern for group 3 PH but RHC/LHC in 3/21 suggested primarily pulmonary venous hypertension due to diastolic dysfunction and ?significant mitral stenosis (mild-moderate by TEE/TTE, moderate by RHC/LHC).  - I do not think that she would be a candidate for selective pulmonary vasodilators.  3. Mitral stenosis: Suspect rheumatic.  The valve looks severely calcified and restricted, but hemodynamics by echo in 3/21 were not that impressive. Mean gradient 5 mmHg and MVA 2.66 cm^2 by PHT suggested no more than mild mitral stenosis. However, cath in 3/21 was suggestive of moderate mitral stenosis with mean gradient 11.5 mmHg and MVA 1.67 cm^2 (cath was done with atrial flutter/RVR however).  TEE was done 3/21 while in NSR and looked like mild-moderate MS with mean gradient 5 mmHg and MVA 2.2 cm^2.  Echo today showed mild-moderate mitral stenosis with mean gradient 6 mmHg.  - She does not appear to be a valvuloplasty candidate due to her degree of MV calcification.  Mitral stenosis not severe enough to warrant surgery at this point.  4. Atrial flutter: TEE-guided DCCV 3/17, now in NSR.  - Continue amiodarone 200 mg daily, check LFTs and TSH today.  She will need regular eye exam.  - Continue apixaban, check CBC.  5. CKD stage 3: BMET today.  6. ILD: Patient has been followed by pulmonary for ILD noted on prior CT.  She has also had hilar/mediastinal adenopathy with some concern for  possible sarcoidosis. She is not on oxygen currently.  - She needs to followup with pulmonary for further workup => refer back to Dr. Chase Caller.   7. HTN: BP controlled on amlodipine 5 mg daily.  8. COPD: Prior smoker.  Suspect there is a component of COPD in addition to ILD.   COVID screen The patient does not have any symptoms that suggest any further testing/ screening at this time.  Social distancing reinforced today.  Patient Risk: After full review of this patients clinical status, I feel that they are at moderate risk for cardiac decompensation at this time.  Relevant cardiac medications were reviewed at length with the patient today. The patient does not have concerns regarding their medications at this time.   Recommended follow-up:  1 month.  Today, I have spent 21 minutes with the patient with telehealth technology discussing the above issues .    Signed, [name], MD  01/25/2020  [facility] [address] [facility] [place] [address] [phone] (office) [phone] (fax)

## 2020-01-31 ENCOUNTER — Telehealth (HOSPITAL_COMMUNITY): Payer: Self-pay

## 2020-01-31 NOTE — Telephone Encounter (Signed)
-----  Message from Larey Dresser, MD sent at 01/24/2020  3:14 PM EST ----- Would make KCl 40 daily instead of 20 daily.  LFTs are still mildly elevated, may be related to amiodarone.  Decrease amiodarone to 100 mg daily and check HAV, HBsAg, HBsAb, HBcAB, HCV antibody.  Repeat LFTs in 3 wks.  Would also get RUQ ultrasound to look at liver.

## 2020-01-31 NOTE — Telephone Encounter (Signed)
Tried calling patient, no answer,unable to leave message. Will try again later.  Letter mailed to address on file 

## 2020-02-04 ENCOUNTER — Ambulatory Visit (HOSPITAL_COMMUNITY)
Admission: RE | Admit: 2020-02-04 | Discharge: 2020-02-04 | Disposition: A | Discharge status: Home / Self Care | Payer: Medicare Other | Source: Ambulatory Visit | Attending: Internal Medicine | Admitting: Internal Medicine

## 2020-02-04 ENCOUNTER — Other Ambulatory Visit: Payer: Self-pay

## 2020-02-04 DIAGNOSIS — I50810 Right heart failure, unspecified: Secondary | ICD-10-CM | POA: Diagnosis not present

## 2020-02-04 LAB — BASIC METABOLIC PANEL
Anion gap: 12 (ref 5–15)
BUN: 24 mg/dL — ABNORMAL HIGH (ref 8–23)
CO2: 25 mmol/L (ref 22–32)
Calcium: 9 mg/dL (ref 8.9–10.3)
Chloride: 101 mmol/L (ref 98–111)
Creatinine, Ser: 1.75 mg/dL — ABNORMAL HIGH (ref 0.44–1.00)
GFR, Estimated: 30 mL/min — ABNORMAL LOW (ref >60)
Glucose, Bld: 195 mg/dL — ABNORMAL HIGH (ref 70–99)
Potassium: 3.2 mmol/L — ABNORMAL LOW (ref 3.5–5.1)
Sodium: 138 mmol/L (ref 135–145)

## 2020-02-05 ENCOUNTER — Other Ambulatory Visit: Payer: Self-pay | Admitting: "Endocrinology

## 2020-02-10 ENCOUNTER — Telehealth (HOSPITAL_COMMUNITY): Payer: Self-pay

## 2020-02-10 NOTE — Telephone Encounter (Signed)
Malena Edman, RN  02/10/2020 10:38 AM EST Back to Top     Tried calling patient, no answer,unable to leave message. Letter mailed to address on file   Harvie Junior, [facility]  02/07/2020 9:18 AM EST      Attempted to reach pt by phone. No answer/no vm.    Philicia Logan Bores, CMA  02/06/2020 9:33 AM EST      Tried calling patient, no answer,unable to leave message. Will try again later.    [place], CMA  02/04/2020 5:40 PM EST      Tried calling patient, no answer,unable to leave message. Will try again later.

## 2020-02-10 NOTE — Telephone Encounter (Signed)
-----  Message from Larey Dresser, MD sent at 02/04/2020  2:37 PM EST ----- Decrease torsemide back to 20 mg daily.  BMET 10 days.

## 2020-02-21 ENCOUNTER — Ambulatory Visit (HOSPITAL_COMMUNITY)
Admission: RE | Admit: 2020-02-21 | Discharge: 2020-02-21 | Disposition: A | Discharge status: Home / Self Care | Source: Ambulatory Visit | Attending: Cardiology | Admitting: Cardiology

## 2020-02-21 ENCOUNTER — Encounter (HOSPITAL_COMMUNITY): Payer: Self-pay | Admitting: Cardiology

## 2020-02-21 ENCOUNTER — Other Ambulatory Visit: Payer: Self-pay

## 2020-02-21 VITALS — BP 132/56 | HR 55 | Wt 162.0 lb

## 2020-02-21 DIAGNOSIS — I4510 Unspecified right bundle-branch block: Secondary | ICD-10-CM | POA: Diagnosis not present

## 2020-02-21 DIAGNOSIS — Z7901 Long term (current) use of anticoagulants: Secondary | ICD-10-CM | POA: Diagnosis not present

## 2020-02-21 DIAGNOSIS — K7469 Other cirrhosis of liver: Secondary | ICD-10-CM

## 2020-02-21 DIAGNOSIS — Z87891 Personal history of nicotine dependence: Secondary | ICD-10-CM | POA: Diagnosis not present

## 2020-02-21 DIAGNOSIS — I4892 Unspecified atrial flutter: Secondary | ICD-10-CM | POA: Insufficient documentation

## 2020-02-21 DIAGNOSIS — I482 Chronic atrial fibrillation, unspecified: Secondary | ICD-10-CM

## 2020-02-21 DIAGNOSIS — I130 Hypertensive heart and chronic kidney disease with heart failure and stage 1 through stage 4 chronic kidney disease, or unspecified chronic kidney disease: Secondary | ICD-10-CM | POA: Diagnosis not present

## 2020-02-21 DIAGNOSIS — J449 Chronic obstructive pulmonary disease, unspecified: Secondary | ICD-10-CM | POA: Diagnosis not present

## 2020-02-21 DIAGNOSIS — J849 Interstitial pulmonary disease, unspecified: Secondary | ICD-10-CM | POA: Diagnosis not present

## 2020-02-21 DIAGNOSIS — E1122 Type 2 diabetes mellitus with diabetic chronic kidney disease: Secondary | ICD-10-CM | POA: Insufficient documentation

## 2020-02-21 DIAGNOSIS — I272 Pulmonary hypertension, unspecified: Secondary | ICD-10-CM | POA: Diagnosis not present

## 2020-02-21 DIAGNOSIS — N183 Chronic kidney disease, stage 3 unspecified: Secondary | ICD-10-CM | POA: Diagnosis not present

## 2020-02-21 DIAGNOSIS — I5032 Chronic diastolic (congestive) heart failure: Secondary | ICD-10-CM | POA: Diagnosis not present

## 2020-02-21 DIAGNOSIS — Z79899 Other long term (current) drug therapy: Secondary | ICD-10-CM | POA: Diagnosis not present

## 2020-02-21 DIAGNOSIS — R918 Other nonspecific abnormal finding of lung field: Secondary | ICD-10-CM

## 2020-02-21 DIAGNOSIS — I050 Rheumatic mitral stenosis: Secondary | ICD-10-CM | POA: Diagnosis not present

## 2020-02-21 DIAGNOSIS — I50810 Right heart failure, unspecified: Secondary | ICD-10-CM | POA: Diagnosis not present

## 2020-02-21 DIAGNOSIS — Z794 Long term (current) use of insulin: Secondary | ICD-10-CM | POA: Diagnosis not present

## 2020-02-21 LAB — COMPREHENSIVE METABOLIC PANEL
ALT: 74 U/L — ABNORMAL HIGH (ref 0–44)
AST: 99 U/L — ABNORMAL HIGH (ref 15–41)
Albumin: 3.8 g/dL (ref 3.5–5.0)
Alkaline Phosphatase: 101 U/L (ref 38–126)
Anion gap: 11 (ref 5–15)
BUN: 21 mg/dL (ref 8–23)
CO2: 25 mmol/L (ref 22–32)
Calcium: 9.6 mg/dL (ref 8.9–10.3)
Chloride: 102 mmol/L (ref 98–111)
Creatinine, Ser: 1.84 mg/dL — ABNORMAL HIGH (ref 0.44–1.00)
GFR, Estimated: 28 mL/min — ABNORMAL LOW (ref >60)
Glucose, Bld: 201 mg/dL — ABNORMAL HIGH (ref 70–99)
Potassium: 4.4 mmol/L (ref 3.5–5.1)
Sodium: 138 mmol/L (ref 135–145)
Total Bilirubin: 1.3 mg/dL — ABNORMAL HIGH (ref 0.3–1.2)
Total Protein: 7.5 g/dL (ref 6.5–8.1)

## 2020-02-21 LAB — SALICYLATE LEVEL: Salicylate Lvl: <7 mg/dL — ABNORMAL LOW (ref 7.0–30.0)

## 2020-02-21 LAB — HEPATITIS C ANTIBODY: HCV Ab: NONREACTIVE

## 2020-02-21 LAB — HEPATITIS A ANTIBODY, TOTAL: hep A Total Ab: NONREACTIVE

## 2020-02-21 LAB — HEPATITIS B SURFACE ANTIGEN: Hepatitis B Surface Ag: NONREACTIVE

## 2020-02-21 LAB — HEPATITIS B SURFACE ANTIBODY,QUALITATIVE: Hep B S Ab: NONREACTIVE

## 2020-02-21 MED ORDER — AMIODARONE HCL 200 MG PO TABS
100.0000 mg | ORAL_TABLET | Freq: Every day | ORAL | 3 refills | Status: DC
Start: 2020-02-21 — End: 2020-06-19

## 2020-02-21 MED ORDER — POTASSIUM CHLORIDE CRYS ER 20 MEQ PO TBCR: 20.0000 meq | EXTENDED_RELEASE_TABLET | Freq: Every day | ORAL | 3 refills | Status: AC

## 2020-02-21 NOTE — Patient Instructions (Addendum)
Labs done today. We will contact you only if your labs are abnormal.  DECREASE Amiodarone to 125m (1/2 tablet) by mouth daily.  START Potassium 228m(1 tablet) by mouth daily.  No other medication changes were made. Please continue all current medications as prescribed.  Your physician recommends that you schedule a follow-up appointment in: 3 months  Your provider has requested that you follow up with Dr. RaOrvil Feilffice soon for an appointment.  [facility] [address] They will contact you to schedule an appointment.  Your provider has requested that you have a ultrasound of your stomach. This has to be approved through your insurance prior to scheduling. Once approved, we will contact you to schedule an appointment.  If you have any questions or concerns before your next appointment please send us[place] message through [email or web address] call our office at [phone]   TO LEAVE A MESSAGE FOR THE NURSE SELECT OPTION 2, PLEASE LEAVE A MESSAGE INCLUDING: . YOUR NAME . DATE OF BIRTH . CALL BACK NUMBER . REASON FOR CALL**this is important as we prioritize the call backs  YOU WILL RECEIVE A CALL BACK THE SAME DAY AS LONG AS YOU CALL BEFORE 4:00 PM   Do the following things EVERYDAY: 1) Weigh yourself in the morning before breakfast. Write it down and keep it in a log. 2) Take your medicines as prescribed 3) Eat low salt foods--Limit salt (sodium) to 2000 mg per day.  4) Stay as active as you can everyday 5) Limit all fluids for the day to less than 2 liters   At [facility] and your health needs are our priority. As part of our continuing mission to provide you with exceptional heart care, we have created designated Provider Care Teams. These Care Teams include your primary Cardiologist (physician) and Advanced Practice Providers (APPs- Physician Assistants and Nurse Practitioners) who all work together to provide you  with the care you need, when you need it.   You may see any of the following providers on your designated Care Team at your next follow up: . Marland Kitchenr DaGlori Bickers Dr DaLoralie Champagne AmDarrick GrinderNP . BrLyda JesterPA . LaAudry RilesPharmD   Please be sure to bring in all your medications bottles to every appointment.

## 2020-02-23 NOTE — Progress Notes (Signed)
ID:  Ellen Jordan, DOB 1945-03-01, MRN [id number]   Provider location: [facility] Type of Visit: Established patient   PCP:  [name], MD  Cardiologist:  Rozann Lesches, MD Primary HF: Dr. Aundra Dubin   History of Present Illness: Ellen Jordan is a 75 y.o. female who has a history of atrial flutter, diabetes, mitral stenosis, COPD/ILD, and chronic diastolic CHF with prominent RV failure.  She has been followed by Dr. Chase Caller in the ILD clinic and workup was ongoing.  She is a former smoker and is suspected to have a combination of COPD and ILD.  Of note, she has been found to have mediastinal and hilar adenopathy.   She was admitted in 3/21 with CHF/volume overload and atrial flutter.  Echo this admission showed LV EF 65-70% with RV moderate-severely dilated and moderate dysfunction, D-shaped septum, the mitral valve is severely calcified though mean gradient is only about 5 mmHg suggesting mild to moderate mitral stenosis (pressure half-time does not appear short), mild AS. The patient was admitted markedly volume overloaded with RV failure. She also had mitral stenosis which visually looked significant but measured only mild-moderate by mean gradient and pressure half-time on echo, this was confirmed by TEE (mild-moderate MS). She was initially oliguric with creatinine up to 4.27.  AKI resolved and she diuresed with the addition of dobutamine for RV support. RHC/LHC this admit showed mildly elevated PCWP with moderate primarily pulmonary venous hypertension. Mitral stenosis appeared moderate by cath (in setting of atrial flutter with RVR) but only mild-moderate by TEE and echo.  She had TEE-guided DCCV on 03/27/19.  She was eventually discharged home after full diuresis and weaning off dobutamine.   Echo in 1/22 showed EF 60-65%, mild RV dilation with normal RV systolic function, calcified mitral valve with mean gradient 6 mmHg (mild-moderate MS), PASP 55 mmHg  with normal IVC size.   She returns for followup of CHF.  She is in NSR.  Generally doing well.  No dyspnea walking on flat ground.  No chest pain.  No orthopnea/PND.  No lightheadedness.  No BRBPR/melena.  Weight is down 3 lbs.   Labs (3/21): K 3.7, creatinine 1.74, hgb 11.1 Labs (11/21): LDL 62, HDL 25, TSH normal, hgb 13.1, K 3.2, creatinine 1.49 Labs (1/22): K 3.2, creatinine 1.75, BNP 120  ECG (personally reviewed): NSR, RBBB  PMH: 1. Type 2 diabetes. 2. Atrial flutter: S/p DCCV 3/21.  3. CKD stage 3 4. Depression 5. HTN 6. H/o uterine cancer 7. Mitral stenosis: Suspect rheumatic.   - Echo (3/21): Mild mitral stenosis mean gradient 4.5.  - LHC/RHC (3/21): mitral valve mean gradient 11.5 mmHg, MVA 1.67 cm^2 => moderate MS but in atrial fibrillation with RVR.  - TEE (3/21): Mild-moderate MS with mean gradient 5 mmHg and MVA 2.2 cm^2 => in NSR.  - Echo (1/22) with mild-moderate MS (mean gradient 6 mmHg).  8. Chronic diastolic CHF with prominent RV failure: Echo (3/21) with EF 60-65%, D-shaped septum, moderate-severe RV enlargement with mildly decreased RV systolic function, PASP 41, rheumatic MV with mean gradient 4.5 (mild MS), mild AS.  - RHC/LHC (3/21): mean RA 9, PA 54/34 mean 34, mean PCWP 18, CI 2.59, PVR 3.27 WU.  - Echo (1/22): EF 60-65%, mild RV dilation with normal RV systolic function, calcified mitral valve with mean gradient 6 mmHg (mild-moderate MS), PASP 55 mmHg with normal IVC size.  9. Pulmonary venous HTN: See 3/21 RHC above.  10. Chronic lung disease:  Suspected to have combination of COPD and ILD.  Of note, has had hilar/mediastinal adenopathy.  Follows with pulmonary.  11. Aortic stenosis: Mild on 3/21 echo.   Social History   Socioeconomic History  . Marital status: Married    Spouse name: earl  . Number of children: 2  . Years of education: 46  . Highest education level: Not on file  Occupational History  . Occupation: retired  Tobacco Use  . Smoking  status: Former Smoker    Packs/day: 1.00    Years: 50.00    Pack years: 50.00    Types: Cigarettes    Start date: 01/10/1961    Quit date: 01/10/2005    Years since quitting: 15.1  . Smokeless tobacco: Former Systems developer    Quit date: 01/10/2005  Vaping Use  . Vaping Use: Never used  Substance and Sexual Activity  . Alcohol use: No    Alcohol/week: 0.0 standard drinks  . Drug use: No  . Sexual activity: Not Currently    Partners: Male  Other Topics Concern  . Not on file  Social History Narrative   Moved to this area in 24 from [place].    Married for over 50 years. Has two children. Lives with husband [name] (earl)   Likes to read   Retired from [employer].   Enjoys time with family, beach, read, exericse.   Walk dog.   Eats all food groups.   Wear seatbelt.   Wear sunscreen.   Social Determinants of Health   Financial Resource Strain: Not on file  Food Insecurity: Not on file  Transportation Needs: Not on file  Physical Activity: Not on file  Stress: Not on file  Social Connections: Not on file  Intimate Partner Violence: Not on file   Family History  Problem Relation Age of Onset  . Heart disease Mother 58  . Hyperlipidemia Mother   . Diabetes Brother   . COPD Father 61       emphysema  . Hypertension Father   . Hyperlipidemia Father   . Diabetes Daughter   . AAA (abdominal aortic aneurysm) Son   . Diabetes Brother   . Diabetes Brother   . Diabetes Brother    ROS: All systems reviewed and negative except as per HPI.   Current Outpatient Medications  Medication Sig Dispense Refill  . Accu-Chek FastClix Lancets MISC TEST BLOOD GLUCOSE FOUR TIMES DAILY 408 each 2  . ACCU-CHEK GUIDE test strip USE AS INSTRUCTED FOUR TIMES DAILY 400 strip 2  . albuterol (PROVENTIL HFA;VENTOLIN HFA) 108 (90 Base) MCG/ACT inhaler Inhale 2 puffs into the lungs every 6 (six) hours as needed for wheezing or shortness of breath. 1 Inhaler 0  . Alcohol Swabs (B-D SINGLE USE SWABS  REGULAR) PADS USE FOUR TIMES DAILY 400 each 2  . amLODipine (NORVASC) 5 MG tablet Take 1 tablet (5 mg total) by mouth daily. 30 tablet 2  . atenolol (TENORMIN) 25 MG tablet Take by mouth daily.    Marland Kitchen atorvastatin (LIPITOR) 40 MG tablet TAKE 1 TABLET EVERY DAY 90 tablet 3  . b complex vitamins tablet Take 1 tablet by mouth daily.    . benzonatate (TESSALON) 200 MG capsule Take 200 mg by mouth 2 (two) times daily as needed.    . Biotin 5000 MCG CAPS Take 1 capsule by mouth daily.    . Blood Glucose Monitoring Suppl (ACCU-CHEK GUIDE) w/Device KIT 1 each by Does not apply route 4 (  four) times daily. 1 kit 0  . chlorpheniramine (CHLOR-TRIMETON) 4 MG tablet Take 4 mg by mouth 2 (two) times daily as needed for allergies.    . cholecalciferol (VITAMIN D) 1000 units tablet Take 1,000 Units by mouth daily.    . diphenhydrAMINE (BENADRYL) 25 MG tablet Take 25 mg by mouth daily as needed for allergies.     Marland Kitchen ELIQUIS 5 MG TABS tablet TAKE 1 TABLET TWICE DAILY 180 tablet 3  . empagliflozin (JARDIANCE) 10 MG TABS tablet Take 1 tablet (10 mg total) by mouth daily before breakfast. 90 tablet 3  . insulin aspart (NOVOLOG) 100 UNIT/ML injection Inject 8-11 Units into the skin 3 (three) times daily with meals. 50 mL 2  . insulin detemir (LEVEMIR) 100 UNIT/ML injection Inject 0.25 mLs (25 Units total) into the skin at bedtime. 60 mL 2  . liraglutide (VICTOZA) 18 MG/3ML SOPN Inject 0.3 mLs (1.8 mg total) into the skin daily. 36 mL 2  . Misc Natural Products (ESTROVEN + ENERGY MAX STRENGTH) TABS Take 1 tablet by mouth daily.    Marland Kitchen omeprazole (PRILOSEC) 20 MG capsule Take 1 capsule (20 mg total) by mouth daily. Must make apt with Cardiologist for further refills. 30 capsule 0  . potassium chloride SA (KLOR-CON) 20 MEQ tablet Take 1 tablet (20 mEq total) by mouth daily. 90 tablet 3  . sertraline (ZOLOFT) 100 MG tablet TAKE 1 TABLET (100 MG TOTAL) BY MOUTH DAILY. 90 tablet 0  . torsemide (DEMADEX) 20 MG tablet Take 1 tablet  (20 mg total) by mouth daily. 30 tablet 2  . vitamin E 180 MG (400 UNITS) capsule Take 400 Units by mouth daily.    Marland Kitchen zinc gluconate 50 MG tablet Take 50 mg by mouth daily.    Marland Kitchen amiodarone (PACERONE) 200 MG tablet Take 0.5 tablets (100 mg total) by mouth daily. 45 tablet 3   No current facility-administered medications for this encounter.   Facility-Administered Medications Ordered in Other Encounters  Medication Dose Route Frequency Provider Last Rate Last Admin  . lactated ringers infusion   Intravenous Continuous PRN Kathryne Hitch, CRNA   New Bag at 12/18/18 1133   BP (!) 132/56   Pulse (!) 55   Wt 73.5 kg (162 lb)   SpO2 94%   BMI 28.70 kg/m  Exam:   General: NAD Neck: No JVD, no thyromegaly or thyroid nodule.  Lungs: Clear to auscultation bilaterally with normal respiratory effort. CV: Nondisplaced PMI.  Heart regular S1/S2, no S3/S4, 1/6 SEM RUSB.  No peripheral edema.  No carotid bruit.  Normal pedal pulses.  Abdomen: Soft, nontender, no hepatosplenomegaly, no distention.  Skin: Intact without lesions or rashes.  Neurologic: Alert and oriented x 3.  Psych: Normal affect. Extremities: No clubbing or cyanosis.  HEENT: Normal.   Assessment/Plan:  1. RV failure/chronic diastolic CHF: ?RV failure due to lung disease +/- mitral stenosis.  Echo in 3/21 showed LV EF 65-70% with RV moderate-severely dilated and moderate dysfunction, D-shaped septum, the mitral valve is severely calcified though mean gradient was only about 5 mmHg suggesting mild to moderate mitral stenosis (pressure half-time does not appear short), mild AS. Mitral stenosis appeared moderate by 3/21 cath but only mild-moderate by TEE and echo in 3/21.  During 3/21 admission, she required transient dobutamine for RV support but was titrated off.  Echo in 1/22 showed EF 60-65%, mild RV dilation with normal function, mild-moderate MS.  She is not volume overloaded today and weight is down 3  lbs.    - Continue  torsemide 20 mg daily.  Add KCl 20 daily. BMET today.   - Continue Jardiance 10 mg daily.  2. Pulmonary hypertension: There was concern for group 3 PH but RHC/LHC in 3/21 suggested primarily pulmonary venous hypertension due to diastolic dysfunction and ?significant mitral stenosis (mild-moderate by TEE/TTE, moderate by RHC/LHC).  - I do not think that she would be a candidate for selective pulmonary vasodilators.  3. Mitral stenosis: Suspect rheumatic.  The valve looks severely calcified and restricted, but hemodynamics by echo in 3/21 were not that impressive. Mean gradient 5 mmHg and MVA 2.66 cm^2 by PHT suggested no more than mild mitral stenosis. However, cath in 3/21 was suggestive of moderate mitral stenosis with mean gradient 11.5 mmHg and MVA 1.67 cm^2 (cath was done with atrial flutter/RVR however). TEE was done 3/21 while in NSR and looked like mild-moderate MS with mean gradient 5 mmHg and MVA 2.2 cm^2.  Echo in 1/22 showed mild-moderate mitral stenosis with mean gradient 6 mmHg.  - She does not appear to be a valvuloplasty candidate due to her degree of MV calcification.  Mitral stenosis not severe enough to warrant surgery at this point.  4. Atrial flutter: TEE-guided DCCV 3/17, now in NSR.  - Continue amiodarone but will decrease to 100 mg daily.  She has had persistent mild transaminase elevation => send hepatitis labs and will check RUQ [place].  - Continue apixaban.  - With elevated LFTs, I would like to get her off amiodarone ultimately. She had significant HF when in atrial fibrillation.  Will refer to EP for consideration of atrial fibrillation ablation.  5. CKD stage 3: BMET today.  6. ILD: Patient has been followed by pulmonary for ILD noted on prior CT.  She has also had hilar/mediastinal adenopathy with some concern for possible sarcoidosis. She is not on oxygen currently.  - She needs to followup with pulmonary for further workup => refer back to Dr. Chase Caller.   - Check ACE  level.  7. HTN: BP controlled on amlodipine 5 mg daily.  8. COPD: Prior smoker.  Suspect there is a component of COPD in addition to ILD.   Followup in 3 months.   Signed, [name], MD  02/23/2020  [facility] [address] [facility] [place] [address] [phone] (office) [phone] (fax)

## 2020-03-04 ENCOUNTER — Other Ambulatory Visit: Payer: Self-pay

## 2020-03-04 ENCOUNTER — Ambulatory Visit (HOSPITAL_COMMUNITY)
Admission: RE | Admit: 2020-03-04 | Discharge: 2020-03-04 | Disposition: A | Discharge status: Home / Self Care | Payer: Medicare Other | Source: Ambulatory Visit | Attending: Cardiology | Admitting: Cardiology

## 2020-03-04 DIAGNOSIS — K7469 Other cirrhosis of liver: Secondary | ICD-10-CM | POA: Diagnosis not present

## 2020-03-17 ENCOUNTER — Ambulatory Visit: Payer: Medicare HMO | Admitting: Cardiology

## 2020-03-17 ENCOUNTER — Encounter: Payer: Self-pay | Admitting: Cardiology

## 2020-03-17 ENCOUNTER — Other Ambulatory Visit: Payer: Self-pay

## 2020-03-17 VITALS — BP 162/64 | HR 58 | Ht 63.0 in

## 2020-03-17 DIAGNOSIS — K7469 Other cirrhosis of liver: Secondary | ICD-10-CM | POA: Diagnosis not present

## 2020-03-17 DIAGNOSIS — I050 Rheumatic mitral stenosis: Secondary | ICD-10-CM

## 2020-03-17 DIAGNOSIS — I480 Paroxysmal atrial fibrillation: Secondary | ICD-10-CM | POA: Diagnosis not present

## 2020-03-17 DIAGNOSIS — I272 Pulmonary hypertension, unspecified: Secondary | ICD-10-CM | POA: Diagnosis not present

## 2020-03-17 NOTE — Patient Instructions (Addendum)
Medication Instructions:  Your physician recommends that you continue on your current medications as directed. Please refer to the Current Medication list given to you today.  Labwork: None ordered.  Testing/Procedures: None ordered.  Follow-Up: Your physician wants you to follow-up in: as needed with Dr. Quentin Ore.    Any Other Special Instructions Will Be Listed Below (If Applicable).  If you need a refill on your cardiac medications before your next appointment, please call your pharmacy.

## 2020-03-17 NOTE — Progress Notes (Signed)
Electrophysiology Office Note:    Date:  03/17/2020   ID:  Ellen Jordan, DOB 02/08/1945, MRN [id number]  PCP:  [name], MD  [facility] Cardiologist:  Rozann Lesches, MD  [facility] Electrophysiologist:  Vickie Epley, MD   Referring MD: Larey Dresser, MD   Chief Complaint: AF  History of Present Illness:    Ellen Jordan is a 75 y.o. female who presents for an evaluation of AF at the request of Dr Aundra Dubin. Their medical history includes atrial flutter, mitral stenosis, COPD/ILD, chronic diastolic heart failure, chronic RV failure. [name] last saw Dr Aundra Dubin on 02/21/2020. Her history of AFL dates back to 03/2019 when she was admitted with acute right heart failure and AFL. During that admission she required DCCV for her AFL. She required dobutamine-assisted diuresis. Her dobutamine was ultimately weaned and she was discharged home. She has maintained sinus rhythm since that time. Recent echo shows moderate MV stenosis with a mean gradient of 6 mmHg.  Past Medical History:  Diagnosis Date  . AKI (acute kidney injury) ([place]) 03/18/2019  . Allergic rhinitis   . Allergy   . Anxiety   . Atrial fibrillation ([place])   . Carpal tunnel syndrome    left  . Cataract   . Depression   . Diabetic neuropathy ([place])   . Dyspnea   . Essential hypertension   . GERD (gastroesophageal reflux disease)   . Mixed hyperlipidemia   . Normocytic anemia 12/18/2018  . PAF (paroxysmal atrial fibrillation) ([place])    a. s/p TEE cardioversion March 2017. b. Recurrence by event monitor 04/2016.  Marland Kitchen Pneumonia   . PONV (postoperative nausea and vomiting)    after previous bronchoscopy  . RBBB   . Restless leg syndrome   . Type 2 diabetes mellitus ([place])   . Uterine cancer [facility])     Past Surgical History:  Procedure Laterality Date  . ABDOMINAL HYSTERECTOMY     uterine cancer  . BACK SURGERY    . CARDIOVERSION N/A 03/11/2015   Procedure: CARDIOVERSION;  Surgeon: [name], MD;   Location: [facility];  Service: Cardiovascular;  Laterality: N/A;  . CARDIOVERSION N/A 03/27/2019   Procedure: CARDIOVERSION;  Surgeon: Larey Dresser, MD;  Location: [facility] ENDOSCOPY;  Service: Cardiovascular;  Laterality: N/A;  . CHOLECYSTECTOMY    . EYE SURGERY     cataracts  . FLEXIBLE BRONCHOSCOPY N/A 07/30/2018   Procedure: FLEXIBLE BRONCHOSCOPY;  Surgeon: Sinda Du, MD;  [facility];  Service: Cardiopulmonary;  Laterality: N/A;  . RIGHT AND LEFT HEART CATH N/A 03/25/2019   Procedure: RIGHT AND LEFT HEART CATH;  Surgeon: Larey Dresser, MD;  Location: [facility] CV LAB;  Service: Cardiovascular;  Laterality: N/A;  . SPINE SURGERY    . TEE WITHOUT CARDIOVERSION N/A 03/11/2015   Procedure: TRANSESOPHAGEAL ECHOCARDIOGRAM (TEE);  Surgeon: [name], MD;  Location: [place];  Service: Cardiovascular;  Laterality: N/A;  . TEE WITHOUT CARDIOVERSION N/A 03/27/2019   Procedure: TRANSESOPHAGEAL ECHOCARDIOGRAM (TEE);  Surgeon: Larey Dresser, MD;  Location[facility] ENDOSCOPY;  Service: Cardiovascular;  Laterality: N/A;  . VIDEO BRONCHOSCOPY WITH ENDOBRONCHIAL ULTRASOUND N/A 12/19/2018   Procedure: VIDEO BRONCHOSCOPY WITH ENDOBRONCHIAL ULTRASOUND;  Surgeon: [name], MD;  Location[facility] OR;  Service: Thoracic;  Laterality: N/A;    Current Medications: Current Meds  Medication Sig  . Accu-Chek FastClix Lancets MISC TEST BLOOD GLUCOSE FOUR TIMES DAILY  . ACCU-CHEK GUIDE test strip USE AS INSTRUCTED  FOUR TIMES DAILY  . albuterol (PROVENTIL HFA;VENTOLIN HFA) 108 (90 Base) MCG/ACT inhaler Inhale 2 puffs into the lungs every 6 (six) hours as needed for wheezing or shortness of breath.  . Alcohol Swabs (B-D SINGLE USE SWABS REGULAR) PADS USE FOUR TIMES DAILY  . amiodarone (PACERONE) 200 MG tablet Take 0.5 tablets (100 mg total) by mouth daily.  Marland Kitchen amLODipine (NORVASC) 5 MG tablet Take 1 tablet (5 mg total) by mouth daily.  Marland Kitchen atenolol (TENORMIN) 25 MG tablet Take by mouth  daily.  Marland Kitchen atorvastatin (LIPITOR) 40 MG tablet TAKE 1 TABLET EVERY DAY  . b complex vitamins tablet Take 1 tablet by mouth daily.  . benzonatate (TESSALON) 200 MG capsule Take 200 mg by mouth 2 (two) times daily as needed.  . Biotin 5000 MCG CAPS Take 1 capsule by mouth daily.  . Blood Glucose Monitoring Suppl (ACCU-CHEK GUIDE) w/Device KIT 1 each by Does not apply route 4 (four) times daily.  . chlorpheniramine (CHLOR-TRIMETON) 4 MG tablet Take 4 mg by mouth 2 (two) times daily as needed for allergies.  . cholecalciferol (VITAMIN D) 1000 units tablet Take 1,000 Units by mouth daily.  . diphenhydrAMINE (BENADRYL) 25 MG tablet Take 25 mg by mouth daily as needed for allergies.   Marland Kitchen ELIQUIS 5 MG TABS tablet TAKE 1 TABLET TWICE DAILY  . empagliflozin (JARDIANCE) 10 MG TABS tablet Take 1 tablet (10 mg total) by mouth daily before breakfast.  . insulin aspart (NOVOLOG) 100 UNIT/ML injection Inject 8-11 Units into the skin 3 (three) times daily with meals.  . insulin detemir (LEVEMIR) 100 UNIT/ML injection Inject 0.25 mLs (25 Units total) into the skin at bedtime.  . liraglutide (VICTOZA) 18 MG/3ML SOPN Inject 0.3 mLs (1.8 mg total) into the skin daily.  . Misc Natural Products (ESTROVEN + ENERGY MAX STRENGTH) TABS Take 1 tablet by mouth daily.  Marland Kitchen omeprazole (PRILOSEC) 20 MG capsule Take 1 capsule (20 mg total) by mouth daily. Must make apt with Cardiologist for further refills.  . potassium chloride SA (KLOR-CON) 20 MEQ tablet Take 1 tablet (20 mEq total) by mouth daily.  . sertraline (ZOLOFT) 100 MG tablet TAKE 1 TABLET (100 MG TOTAL) BY MOUTH DAILY.  Marland Kitchen torsemide (DEMADEX) 20 MG tablet Take 1 tablet (20 mg total) by mouth daily. (Patient taking differently: Take 10 mg by mouth daily.)  . vitamin E 180 MG (400 UNITS) capsule Take 400 Units by mouth daily.  Marland Kitchen zinc gluconate 50 MG tablet Take 50 mg by mouth daily.     Allergies:   Penicillins   Social History   Socioeconomic History  . Marital  status: Married    Spouse name: earl  . Number of children: 2  . Years of education: 42  . Highest education level: Not on file  Occupational History  . Occupation: retired  Tobacco Use  . Smoking status: Former Smoker    Packs/day: 1.00    Years: 50.00    Pack years: 50.00    Types: Cigarettes    Start date: 01/10/1961    Quit date: 01/10/2005    Years since quitting: 15.1  . Smokeless tobacco: Former Systems developer    Quit date: 01/10/2005  Vaping Use  . Vaping Use: Never used  Substance and Sexual Activity  . Alcohol use: No    Alcohol/week: 0.0 standard drinks  . Drug use: No  . Sexual activity: Not Currently    Partners: Male  Other Topics Concern  . Not on file  Social History Narrative   Moved to this area in [place] from [place].    Married for over 50 years. Has two children. Lives with husband [name] (earl)   Likes to read   Retired from [employer].   Enjoys time with family, beach, read, exericse.   Walk dog.   Eats all food groups.   Wear seatbelt.   Wear sunscreen.   Social Determinants of Health   Financial Resource Strain: Not on file  Food Insecurity: Not on file  Transportation Needs: Not on file  Physical Activity: Not on file  Stress: Not on file  Social Connections: Not on file     Family History: The patient's family history includes AAA (abdominal aortic aneurysm) in her son; COPD (age of onset: 54) in her father; Diabetes in her brother, brother, brother, brother, and daughter; Heart disease (age of onset: 61) in her mother; Hyperlipidemia in her father and mother; Hypertension in her father.  ROS:   Please see the history of present illness.    All other systems reviewed and are negative.  EKGs/Labs/Other Studies Reviewed:    The following studies were reviewed today:  January 24, 2020 echo personally reviewed Left ventricular function normal, 60%  mild LVH Right ventricular function normal Elevated PASP with an estimated RVSP of at least 55  mmHg Dilated left and right atria Interventricular septal flattening during systole suggestive of significantly elevated right-sided pressures    EKG:  The ekg ordered today demonstrates sinus rhythm with a ventricular rate of 60 bpm.  Recent Labs: 03/25/2019: Magnesium 1.8 01/24/2020: B Natriuretic Peptide 119.9; Hemoglobin 12.3; Platelets 159; TSH 3.073 02/21/2020: ALT 74; BUN 21; Creatinine, Ser 1.84; Potassium 4.4; Sodium 138  Recent Lipid Panel    Component Value Date/Time   CHOL 115 11/11/2019 1239   TRIG 140 11/11/2019 1239   HDL 25 (L) 11/11/2019 1239   CHOLHDL 4.6 11/11/2019 1239   VLDL 28 11/11/2019 1239   LDLCALC 62 11/11/2019 1239   LDLCALC 63 06/16/2017 0959    Physical Exam:    VS:  BP (!) 162/64   Pulse (!) 58   Ht _0  (1.6 m)   SpO2 94%   BMI 28.70 kg/m     Wt Readings from Last 3 Encounters:  02/21/20 162 lb (73.5 kg)  01/24/20 165 lb 3.2 oz (74.9 kg)  11/26/19 166 lb (75.3 kg)     GEN:  Well nourished, well developed in no acute distress HEENT: Normal NECK: No JVD; No carotid bruits LYMPHATICS: No lymphadenopathy CARDIAC: RRR, no murmurs, rubs, gallops RESPIRATORY:  Clear to auscultation without rales, wheezing or rhonchi  ABDOMEN: Soft, non-tender, non-distended MUSCULOSKELETAL:  No edema; No deformity  SKIN: Warm and dry NEUROLOGIC:  Alert and oriented x 3 PSYCHIATRIC:  Normal affect   ASSESSMENT:    1. PAF (paroxysmal atrial fibrillation) ([place])   2. Pulmonary hypertension ([place])   3. Other cirrhosis of liver ([place])   4. Rheumatic mitral stenosis    PLAN:    In order of problems listed above:  1. Paroxysmal atrial fibrillation The patient has symptomatic paroxysmal atrial fibrillation.  She is currently rhythm controlled with amiodarone.  She takes 100 mg of amnio once daily.  She uses Eliquis for stroke prophylaxis.  Thankfully she is maintaining normal rhythm on amiodarone.  A rhythm control strategy is warranted for Ms. Stotts because  of a history of diastolic heart failure.  I suspect when she is in atrial fibrillation with rapid ventricular rates,  she becomes quickly volume overloaded because of the degree of mitral stenosis observed on echo.  We discussed rhythm control strategies during today's visit including using antiarrhythmic medications and ablation therapy.  The patient's extensive medical history including severe pulmonary hypertension, cirrhosis, chronic kidney disease and significant mitral stenosis make her not an ideal candidate for ablation.  We discussed this at length during today's visit.  For right now, I would recommend continuing her amiodarone 100 mg by mouth once daily to help maintain normal rhythm.  We will have to carefully monitor her liver function over time given her documented cirrhosis on liver imaging.   2.  Pulmonary hypertension Observed on multiple echocardiograms. Likely has led to cirrhosis.  3.  Mitral stenosis Significant.  I suspect her degree of mitral stenosis creates her heart failure symptoms when she is in atrial fibrillation.   Medication Adjustments/Labs and Tests Ordered: Current medicines are reviewed at length with the patient today.  Concerns regarding medicines are outlined above.  Orders Placed This Encounter  Procedures  . EKG 12-Lead   No orders of the defined types were placed in this encounter.    Signed, Lars Mage, MD, [facility]  03/17/2020 9:44 PM    Electrophysiology [place]

## 2020-03-30 ENCOUNTER — Other Ambulatory Visit: Payer: Self-pay | Admitting: Cardiology

## 2020-04-09 DIAGNOSIS — I480 Paroxysmal atrial fibrillation: Secondary | ICD-10-CM | POA: Diagnosis not present

## 2020-04-09 DIAGNOSIS — N189 Chronic kidney disease, unspecified: Secondary | ICD-10-CM | POA: Diagnosis not present

## 2020-04-09 DIAGNOSIS — I5032 Chronic diastolic (congestive) heart failure: Secondary | ICD-10-CM | POA: Diagnosis not present

## 2020-04-09 DIAGNOSIS — I129 Hypertensive chronic kidney disease with stage 1 through stage 4 chronic kidney disease, or unspecified chronic kidney disease: Secondary | ICD-10-CM | POA: Diagnosis not present

## 2020-04-09 DIAGNOSIS — Z79899 Other long term (current) drug therapy: Secondary | ICD-10-CM | POA: Diagnosis not present

## 2020-04-09 DIAGNOSIS — E1122 Type 2 diabetes mellitus with diabetic chronic kidney disease: Secondary | ICD-10-CM | POA: Diagnosis not present

## 2020-04-09 DIAGNOSIS — Z7189 Other specified counseling: Secondary | ICD-10-CM | POA: Diagnosis not present

## 2020-04-14 ENCOUNTER — Telehealth: Payer: Self-pay | Admitting: Nurse Practitioner

## 2020-04-14 NOTE — Telephone Encounter (Signed)
Called and informed pt her pt assistance medication is in.   Victoza  Novolog Flexpen  Levemir

## 2020-04-30 ENCOUNTER — Other Ambulatory Visit (HOSPITAL_COMMUNITY): Payer: Self-pay | Admitting: Nephrology

## 2020-04-30 ENCOUNTER — Other Ambulatory Visit: Payer: Self-pay | Admitting: Nephrology

## 2020-04-30 DIAGNOSIS — I129 Hypertensive chronic kidney disease with stage 1 through stage 4 chronic kidney disease, or unspecified chronic kidney disease: Secondary | ICD-10-CM

## 2020-04-30 DIAGNOSIS — E1122 Type 2 diabetes mellitus with diabetic chronic kidney disease: Secondary | ICD-10-CM

## 2020-05-08 ENCOUNTER — Other Ambulatory Visit: Payer: Self-pay

## 2020-05-08 ENCOUNTER — Ambulatory Visit (HOSPITAL_COMMUNITY)
Admission: RE | Admit: 2020-05-08 | Discharge: 2020-05-08 | Disposition: A | Discharge status: Home / Self Care | Payer: Medicare Other | Source: Ambulatory Visit | Attending: Nephrology | Admitting: Nephrology

## 2020-05-08 DIAGNOSIS — I480 Paroxysmal atrial fibrillation: Secondary | ICD-10-CM | POA: Diagnosis not present

## 2020-05-08 DIAGNOSIS — I129 Hypertensive chronic kidney disease with stage 1 through stage 4 chronic kidney disease, or unspecified chronic kidney disease: Secondary | ICD-10-CM | POA: Diagnosis not present

## 2020-05-08 DIAGNOSIS — I5032 Chronic diastolic (congestive) heart failure: Secondary | ICD-10-CM | POA: Diagnosis not present

## 2020-05-08 DIAGNOSIS — Z79899 Other long term (current) drug therapy: Secondary | ICD-10-CM | POA: Diagnosis not present

## 2020-05-08 DIAGNOSIS — E559 Vitamin D deficiency, unspecified: Secondary | ICD-10-CM | POA: Diagnosis not present

## 2020-05-08 DIAGNOSIS — N189 Chronic kidney disease, unspecified: Secondary | ICD-10-CM | POA: Diagnosis not present

## 2020-05-08 DIAGNOSIS — E1122 Type 2 diabetes mellitus with diabetic chronic kidney disease: Secondary | ICD-10-CM | POA: Insufficient documentation

## 2020-05-14 DIAGNOSIS — E211 Secondary hyperparathyroidism, not elsewhere classified: Secondary | ICD-10-CM | POA: Diagnosis not present

## 2020-05-14 DIAGNOSIS — I5032 Chronic diastolic (congestive) heart failure: Secondary | ICD-10-CM | POA: Diagnosis not present

## 2020-05-14 DIAGNOSIS — I129 Hypertensive chronic kidney disease with stage 1 through stage 4 chronic kidney disease, or unspecified chronic kidney disease: Secondary | ICD-10-CM | POA: Diagnosis not present

## 2020-05-14 DIAGNOSIS — N189 Chronic kidney disease, unspecified: Secondary | ICD-10-CM | POA: Diagnosis not present

## 2020-05-14 DIAGNOSIS — E1122 Type 2 diabetes mellitus with diabetic chronic kidney disease: Secondary | ICD-10-CM | POA: Diagnosis not present

## 2020-05-25 ENCOUNTER — Ambulatory Visit: Payer: Medicare HMO | Admitting: Nurse Practitioner

## 2020-05-25 DIAGNOSIS — C44622 Squamous cell carcinoma of skin of right upper limb, including shoulder: Secondary | ICD-10-CM | POA: Diagnosis not present

## 2020-05-25 DIAGNOSIS — R21 Rash and other nonspecific skin eruption: Secondary | ICD-10-CM | POA: Diagnosis not present

## 2020-05-25 DIAGNOSIS — D485 Neoplasm of uncertain behavior of skin: Secondary | ICD-10-CM | POA: Diagnosis not present

## 2020-05-25 DIAGNOSIS — D1801 Hemangioma of skin and subcutaneous tissue: Secondary | ICD-10-CM | POA: Diagnosis not present

## 2020-05-25 DIAGNOSIS — L570 Actinic keratosis: Secondary | ICD-10-CM | POA: Diagnosis not present

## 2020-05-25 DIAGNOSIS — D0462 Carcinoma in situ of skin of left upper limb, including shoulder: Secondary | ICD-10-CM | POA: Diagnosis not present

## 2020-05-25 DIAGNOSIS — Z85828 Personal history of other malignant neoplasm of skin: Secondary | ICD-10-CM | POA: Diagnosis not present

## 2020-05-26 ENCOUNTER — Encounter (HOSPITAL_COMMUNITY): Payer: Medicare Other | Admitting: Cardiology

## 2020-05-27 ENCOUNTER — Ambulatory Visit: Payer: Medicare HMO | Admitting: Nurse Practitioner

## 2020-06-16 DIAGNOSIS — Z85828 Personal history of other malignant neoplasm of skin: Secondary | ICD-10-CM | POA: Diagnosis not present

## 2020-06-16 DIAGNOSIS — D0462 Carcinoma in situ of skin of left upper limb, including shoulder: Secondary | ICD-10-CM | POA: Diagnosis not present

## 2020-06-16 DIAGNOSIS — C44629 Squamous cell carcinoma of skin of left upper limb, including shoulder: Secondary | ICD-10-CM | POA: Diagnosis not present

## 2020-06-17 ENCOUNTER — Other Ambulatory Visit: Payer: Self-pay | Admitting: "Endocrinology

## 2020-06-18 ENCOUNTER — Telehealth (HOSPITAL_COMMUNITY): Payer: Self-pay | Admitting: *Deleted

## 2020-06-18 NOTE — Telephone Encounter (Signed)
Increase amiodarone to 200 mg daily, increase torsemide to 40 mg daily, needs appointment in CHF clinic in the next few days, can be me or APP.

## 2020-06-18 NOTE — Telephone Encounter (Signed)
Bonnie,RN with [facility] called to report pt is more short of breath now having to use oxygen. Pt has not used oxygen in the last 59month. RN reported pts lungs are tight and notices wheezing. Pt has edema in feet, ankles, and abdomen. Pt is in afib rate 108-125.   Routed to DFarinafor advice

## 2020-06-19 MED ORDER — TORSEMIDE 20 MG PO TABS
20.0000 mg | ORAL_TABLET | Freq: Every day | ORAL | 2 refills | Status: DC
Start: 2020-06-19 — End: 2020-07-23

## 2020-06-19 MED ORDER — AMIODARONE HCL 200 MG PO TABS
200.0000 mg | ORAL_TABLET | Freq: Every day | ORAL | 3 refills | Status: DC
Start: 2020-06-19 — End: 2020-07-09

## 2020-06-19 NOTE — Telephone Encounter (Signed)
Spoke with Ellen Jordan and she said pt is taking torsemide 31m daily.

## 2020-06-23 DIAGNOSIS — N189 Chronic kidney disease, unspecified: Secondary | ICD-10-CM | POA: Diagnosis not present

## 2020-06-23 DIAGNOSIS — E1100 Type 2 diabetes mellitus with hyperosmolarity without nonketotic hyperglycemic-hyperosmolar coma (NKHHC): Secondary | ICD-10-CM | POA: Diagnosis not present

## 2020-06-27 DIAGNOSIS — I4891 Unspecified atrial fibrillation: Secondary | ICD-10-CM | POA: Diagnosis not present

## 2020-06-30 DIAGNOSIS — E1165 Type 2 diabetes mellitus with hyperglycemia: Secondary | ICD-10-CM | POA: Diagnosis not present

## 2020-06-30 DIAGNOSIS — I5032 Chronic diastolic (congestive) heart failure: Secondary | ICD-10-CM | POA: Diagnosis not present

## 2020-06-30 DIAGNOSIS — I340 Nonrheumatic mitral (valve) insufficiency: Secondary | ICD-10-CM | POA: Diagnosis not present

## 2020-06-30 DIAGNOSIS — I4892 Unspecified atrial flutter: Secondary | ICD-10-CM | POA: Diagnosis not present

## 2020-06-30 DIAGNOSIS — N1832 Chronic kidney disease, stage 3b: Secondary | ICD-10-CM | POA: Diagnosis not present

## 2020-06-30 DIAGNOSIS — J449 Chronic obstructive pulmonary disease, unspecified: Secondary | ICD-10-CM | POA: Diagnosis not present

## 2020-06-30 DIAGNOSIS — E782 Mixed hyperlipidemia: Secondary | ICD-10-CM | POA: Diagnosis not present

## 2020-06-30 DIAGNOSIS — I50810 Right heart failure, unspecified: Secondary | ICD-10-CM | POA: Diagnosis not present

## 2020-06-30 DIAGNOSIS — R748 Abnormal levels of other serum enzymes: Secondary | ICD-10-CM | POA: Diagnosis not present

## 2020-07-09 ENCOUNTER — Other Ambulatory Visit (HOSPITAL_COMMUNITY): Payer: Self-pay | Admitting: Cardiology

## 2020-07-15 ENCOUNTER — Telehealth (HOSPITAL_COMMUNITY): Payer: Self-pay | Admitting: *Deleted

## 2020-07-15 NOTE — Telephone Encounter (Signed)
Pt left vm stating she missed her office visit with Dr.McLean and needs to r/s her appt. Pt c/o breathing problems and thinks she is back in afib. Dr.McLean and APP schedule full. Ok to refer to afib clinic?  Routed to [place] for advice

## 2020-07-15 NOTE — Telephone Encounter (Signed)
See if you can overbook next week with me.  Would have her come to office this week to get ECG. I suspect she has significant CHF.

## 2020-07-15 NOTE — Telephone Encounter (Signed)
Left vm for pt to return call

## 2020-07-16 DIAGNOSIS — I5032 Chronic diastolic (congestive) heart failure: Secondary | ICD-10-CM | POA: Diagnosis not present

## 2020-07-16 DIAGNOSIS — E1122 Type 2 diabetes mellitus with diabetic chronic kidney disease: Secondary | ICD-10-CM | POA: Diagnosis not present

## 2020-07-16 DIAGNOSIS — E211 Secondary hyperparathyroidism, not elsewhere classified: Secondary | ICD-10-CM | POA: Diagnosis not present

## 2020-07-16 DIAGNOSIS — N189 Chronic kidney disease, unspecified: Secondary | ICD-10-CM | POA: Diagnosis not present

## 2020-07-16 DIAGNOSIS — I129 Hypertensive chronic kidney disease with stage 1 through stage 4 chronic kidney disease, or unspecified chronic kidney disease: Secondary | ICD-10-CM | POA: Diagnosis not present

## 2020-07-20 NOTE — Telephone Encounter (Signed)
Pt left vm returning my call and asked that we only call her on her home phone. I called pt back to schedule overbook appt per Dr.McLean she did not answer home number.

## 2020-07-20 NOTE — Telephone Encounter (Signed)
Pt returned call and appt scheduled.

## 2020-07-23 ENCOUNTER — Other Ambulatory Visit (HOSPITAL_COMMUNITY): Payer: Self-pay

## 2020-07-23 ENCOUNTER — Other Ambulatory Visit: Payer: Self-pay

## 2020-07-23 ENCOUNTER — Encounter (HOSPITAL_COMMUNITY): Payer: Self-pay | Admitting: Cardiology

## 2020-07-23 ENCOUNTER — Encounter: Payer: Self-pay | Admitting: Internal Medicine

## 2020-07-23 ENCOUNTER — Ambulatory Visit (HOSPITAL_COMMUNITY)
Admission: RE | Admit: 2020-07-23 | Discharge: 2020-07-23 | Disposition: A | Discharge status: Home / Self Care | Payer: Medicare Other | Source: Ambulatory Visit | Attending: Cardiology | Admitting: Cardiology

## 2020-07-23 VITALS — BP 140/78 | HR 99 | Wt 156.6 lb

## 2020-07-23 DIAGNOSIS — Z794 Long term (current) use of insulin: Secondary | ICD-10-CM | POA: Insufficient documentation

## 2020-07-23 DIAGNOSIS — J849 Interstitial pulmonary disease, unspecified: Secondary | ICD-10-CM | POA: Diagnosis not present

## 2020-07-23 DIAGNOSIS — D638 Anemia in other chronic diseases classified elsewhere: Secondary | ICD-10-CM | POA: Diagnosis not present

## 2020-07-23 DIAGNOSIS — E211 Secondary hyperparathyroidism, not elsewhere classified: Secondary | ICD-10-CM | POA: Diagnosis not present

## 2020-07-23 DIAGNOSIS — Z7901 Long term (current) use of anticoagulants: Secondary | ICD-10-CM | POA: Insufficient documentation

## 2020-07-23 DIAGNOSIS — E1122 Type 2 diabetes mellitus with diabetic chronic kidney disease: Secondary | ICD-10-CM | POA: Diagnosis not present

## 2020-07-23 DIAGNOSIS — Z79899 Other long term (current) drug therapy: Secondary | ICD-10-CM | POA: Diagnosis not present

## 2020-07-23 DIAGNOSIS — I50810 Right heart failure, unspecified: Secondary | ICD-10-CM | POA: Diagnosis not present

## 2020-07-23 DIAGNOSIS — I130 Hypertensive heart and chronic kidney disease with heart failure and stage 1 through stage 4 chronic kidney disease, or unspecified chronic kidney disease: Secondary | ICD-10-CM | POA: Insufficient documentation

## 2020-07-23 DIAGNOSIS — I509 Heart failure, unspecified: Secondary | ICD-10-CM

## 2020-07-23 DIAGNOSIS — I4891 Unspecified atrial fibrillation: Secondary | ICD-10-CM | POA: Insufficient documentation

## 2020-07-23 DIAGNOSIS — I5032 Chronic diastolic (congestive) heart failure: Secondary | ICD-10-CM | POA: Diagnosis not present

## 2020-07-23 DIAGNOSIS — J449 Chronic obstructive pulmonary disease, unspecified: Secondary | ICD-10-CM | POA: Insufficient documentation

## 2020-07-23 DIAGNOSIS — R918 Other nonspecific abnormal finding of lung field: Secondary | ICD-10-CM | POA: Insufficient documentation

## 2020-07-23 DIAGNOSIS — N183 Chronic kidney disease, stage 3 unspecified: Secondary | ICD-10-CM | POA: Diagnosis not present

## 2020-07-23 DIAGNOSIS — I4892 Unspecified atrial flutter: Secondary | ICD-10-CM | POA: Diagnosis not present

## 2020-07-23 DIAGNOSIS — I272 Pulmonary hypertension, unspecified: Secondary | ICD-10-CM

## 2020-07-23 DIAGNOSIS — I482 Chronic atrial fibrillation, unspecified: Secondary | ICD-10-CM | POA: Diagnosis not present

## 2020-07-23 DIAGNOSIS — K7460 Unspecified cirrhosis of liver: Secondary | ICD-10-CM | POA: Insufficient documentation

## 2020-07-23 DIAGNOSIS — Z8249 Family history of ischemic heart disease and other diseases of the circulatory system: Secondary | ICD-10-CM | POA: Insufficient documentation

## 2020-07-23 DIAGNOSIS — Z87891 Personal history of nicotine dependence: Secondary | ICD-10-CM | POA: Insufficient documentation

## 2020-07-23 DIAGNOSIS — R0602 Shortness of breath: Secondary | ICD-10-CM | POA: Insufficient documentation

## 2020-07-23 DIAGNOSIS — N189 Chronic kidney disease, unspecified: Secondary | ICD-10-CM | POA: Diagnosis not present

## 2020-07-23 DIAGNOSIS — E079 Disorder of thyroid, unspecified: Secondary | ICD-10-CM | POA: Insufficient documentation

## 2020-07-23 DIAGNOSIS — I129 Hypertensive chronic kidney disease with stage 1 through stage 4 chronic kidney disease, or unspecified chronic kidney disease: Secondary | ICD-10-CM | POA: Diagnosis not present

## 2020-07-23 LAB — CBC
HCT: 36.4 % (ref 36.0–46.0)
Hemoglobin: 11.5 g/dL — ABNORMAL LOW (ref 12.0–15.0)
MCH: 30.7 pg (ref 26.0–34.0)
MCHC: 31.6 g/dL (ref 30.0–36.0)
MCV: 97.1 fL (ref 80.0–100.0)
Platelets: 192 10*3/uL (ref 150–400)
RBC: 3.75 MIL/uL — ABNORMAL LOW (ref 3.87–5.11)
RDW: 14.1 % (ref 11.5–15.5)
WBC: 8.6 10*3/uL (ref 4.0–10.5)
nRBC: 0 % (ref 0.0–0.2)

## 2020-07-23 LAB — COMPREHENSIVE METABOLIC PANEL
ALT: 52 U/L — ABNORMAL HIGH (ref 0–44)
AST: 92 U/L — ABNORMAL HIGH (ref 15–41)
Albumin: 2.7 g/dL — ABNORMAL LOW (ref 3.5–5.0)
Alkaline Phosphatase: 142 U/L — ABNORMAL HIGH (ref 38–126)
Anion gap: 10 (ref 5–15)
BUN: 16 mg/dL (ref 8–23)
CO2: 26 mmol/L (ref 22–32)
Calcium: 8.6 mg/dL — ABNORMAL LOW (ref 8.9–10.3)
Chloride: 102 mmol/L (ref 98–111)
Creatinine, Ser: 1.19 mg/dL — ABNORMAL HIGH (ref 0.44–1.00)
GFR, Estimated: 47 mL/min — ABNORMAL LOW (ref >60)
Glucose, Bld: 165 mg/dL — ABNORMAL HIGH (ref 70–99)
Potassium: 3.7 mmol/L (ref 3.5–5.1)
Sodium: 138 mmol/L (ref 135–145)
Total Bilirubin: 1.1 mg/dL (ref 0.3–1.2)
Total Protein: 6.8 g/dL (ref 6.5–8.1)

## 2020-07-23 LAB — TSH: TSH: 5.099 u[IU]/mL — ABNORMAL HIGH (ref 0.350–4.500)

## 2020-07-23 MED ORDER — TORSEMIDE 20 MG PO TABS
40.0000 mg | ORAL_TABLET | Freq: Every day | ORAL | 2 refills | Status: AC
Start: 2020-07-23 — End: ?

## 2020-07-23 NOTE — H&P (View-Only) (Signed)
ID:  Ellen Jordan, DOB Aug 30, 1945, MRN [id number]   Provider location: [place] [facility] Type of Visit: Established patient   PCP:  Ellen Squibb, MD  Cardiologist:  Ellen Lesches, MD Primary HF: Dr. Aundra Jordan   History of Present Illness: Ellen Jordan is a 75 y.o. female who has a history of atrial flutter, diabetes, mitral stenosis, COPD/ILD, and chronic diastolic CHF with prominent RV failure.  She has been followed by Dr. Chase Jordan in the ILD clinic and workup was ongoing.  She is a former smoker and is suspected to have a combination of COPD and ILD.  Of note, she has been found to have mediastinal and hilar adenopathy.    She was admitted in 3/21 with CHF/volume overload and atrial flutter.  Echo this admission showed LV EF 65-70% with RV moderate-severely dilated and moderate dysfunction, D-shaped septum, the mitral valve is severely calcified though mean gradient is only about 5 mmHg suggesting mild to moderate mitral stenosis (pressure half-time does not appear short), mild AS.  The patient was admitted markedly volume overloaded with RV failure.  She also had mitral stenosis which visually looked significant but measured only mild-moderate by mean gradient and pressure half-time on echo, this was confirmed by TEE (mild-moderate MS). She was initially oliguric with creatinine up to 4.27.  AKI resolved and she diuresed with the addition of dobutamine for RV support. RHC/LHC this admit showed mildly elevated PCWP with moderate primarily pulmonary venous hypertension. Mitral stenosis appeared moderate by cath (in setting of atrial flutter with RVR) but only mild-moderate by TEE and echo.  She had TEE-guided DCCV on 03/27/19.  She was eventually discharged home after full diuresis and weaning off dobutamine.   Echo in 1/22 showed EF 60-65%, mild RV dilation with normal RV systolic function, calcified mitral valve with mean gradient 6 mmHg (mild-moderate MS), PASP 55 mmHg  with normal IVC size.   She was noted to have elevated LFTs.  Viral hepatitis serologies were negative. RUQ [place] was concerning for cirrhosis.  Amiodarone was decreased to 100 mg daily.   She returns for followup of CHF.  She has felt worse for about 2 months.  She thinks she has been in atrial fibrillation over that time as her HR has been higher (in the 90s).  She is in atrial fibrillation today. She has not missed any Eliquis doses.  She is using home oxygen.  She is short of breath walking short distances just around the house.  No chest pain.  No lightheadedness.  She does note orthopnea and bendopnea.  Weight has not risen.    Labs (3/21): K 3.7, creatinine 1.74, hgb 11.1 Labs (11/21): LDL 62, HDL 25, TSH normal, hgb 13.1, K 3.2, creatinine 1.49 Labs (1/22): K 3.2, creatinine 1.75, BNP 120 Labs (2/22): K 4.4, creatinine 1.84, AST 99, ALT 74, viral hepatitis serologies negative   ECG (personally reviewed): Atrial fibrillation with RBBB  PMH: 1. Type 2 diabetes. 2. Atrial flutter: S/p DCCV 3/21.  3. CKD stage 3 4. Depression 5. HTN 6. H/o uterine cancer 7. Mitral stenosis: Suspect rheumatic.   - Echo (3/21): Mild mitral stenosis mean gradient 4.5.  - LHC/RHC (3/21): mitral valve mean gradient 11.5 mmHg, MVA 1.67 cm^2 => moderate MS but in atrial fibrillation with RVR.  - TEE (3/21): Mild-moderate MS with mean gradient 5 mmHg and MVA 2.2 cm^2 => in NSR.  - Echo (1/22) with mild-moderate MS (mean gradient 6 mmHg).  8. Chronic diastolic CHF with prominent RV failure: Echo (3/21) with EF 60-65%, D-shaped septum, moderate-severe RV enlargement with mildly decreased RV systolic function, PASP 41, rheumatic MV with mean gradient 4.5 (mild MS), mild AS.  - RHC/LHC (3/21): mean RA 9, PA 54/34 mean 34, mean PCWP 18, CI 2.59, PVR 3.27 WU.  - Echo (1/22): EF 60-65%, mild RV dilation with normal RV systolic function, calcified mitral valve with mean gradient 6 mmHg (mild-moderate MS), PASP 55 mmHg  with normal IVC size.  9. Pulmonary venous HTN: See 3/21 RHC above.  10. Chronic lung disease:  Suspected to have combination of COPD and ILD.  Of note, has had hilar/mediastinal adenopathy.  Follows with pulmonary.  11. Aortic stenosis: Mild on 3/21 echo.  12. Cirrhosis: Elevated LFTs noted, viral hepatitis serologies negative.  - RUQ [place] (2/22): Concerning for cirrhosis.   Social History   Socioeconomic History   Marital status: Married    Spouse name: earl   Number of children: 2   Years of education: 13   Highest education level: Not on file  Occupational History   Occupation: retired  Tobacco Use   Smoking status: Former    Packs/day: 1.00    Years: 50.00    Pack years: 50.00    Types: Cigarettes    Start date: 01/10/1961    Quit date: 01/10/2005    Years since quitting: 15.5   Smokeless tobacco: Former    Quit date: 01/10/2005  Vaping Use   Vaping Use: Never used  Substance and Sexual Activity   Alcohol use: No    Alcohol/week: 0.0 standard drinks   Drug use: No   Sexual activity: Not Currently    Partners: Male  Other Topics Concern   Not on file  Social History Narrative   Moved to this area in 56 from [place].    Married for over 50 years. Has two children. Lives with husband Ellen Jordan (earl)   Likes to read   Retired from [employer].   Enjoys time with family, beach, read, exericse.   Walk dog.   Eats all food groups.   Wear seatbelt.   Wear sunscreen.   Social Determinants of Health   Financial Resource Strain: Not on file  Food Insecurity: Not on file  Transportation Needs: Not on file  Physical Activity: Not on file  Stress: Not on file  Social Connections: Not on file  Intimate Partner Violence: Not on file   Family History  Problem Relation Age of Onset   Heart disease Mother 57   Hyperlipidemia Mother    Diabetes Brother    COPD Father 34       emphysema   Hypertension Father    Hyperlipidemia Father    Diabetes Daughter    AAA  (abdominal aortic aneurysm) Son    Diabetes Brother    Diabetes Brother    Diabetes Brother    ROS: All systems reviewed and negative except as per HPI.   Current Outpatient Medications  Medication Sig Dispense Refill   Accu-Chek FastClix Lancets MISC TEST BLOOD GLUCOSE FOUR TIMES DAILY 408 each 2   ACCU-CHEK GUIDE test strip USE AS INSTRUCTED FOUR TIMES DAILY 400 strip 2   Alcohol Swabs (B-D SINGLE USE SWABS REGULAR) PADS USE FOUR TIMES DAILY 400 each 2   amiodarone (PACERONE) 200 MG tablet Take 1 tablet (200 mg total) by mouth daily. Needs f/u appt for further refills. Please call [phone] to schedule an appt. 30 tablet 2  amLODipine (NORVASC) 5 MG tablet Take 1 tablet (5 mg total) by mouth daily. 30 tablet 2   atenolol (TENORMIN) 25 MG tablet Take 25 mg by mouth daily.     atorvastatin (LIPITOR) 40 MG tablet TAKE 1 TABLET EVERY DAY (Patient not taking: No sig reported) 90 tablet 3   b complex vitamins tablet Take 1 tablet by mouth daily.     benzonatate (TESSALON) 200 MG capsule Take 200 mg by mouth daily.     Biotin 5000 MCG CAPS Take 5,000 mcg by mouth daily.     Blood Glucose Monitoring Suppl (ACCU-CHEK GUIDE) w/Device KIT 1 each by Does not apply route 4 (four) times daily. 1 kit 0   cholecalciferol (VITAMIN D) 1000 units tablet Take 1,000 Units by mouth daily.     diphenhydrAMINE (BENADRYL) 25 MG tablet Take 25 mg by mouth daily as needed for allergies.      ELIQUIS 5 MG TABS tablet TAKE 1 TABLET TWICE DAILY (Patient taking differently: Take 5 mg by mouth 2 (two) times daily.) 180 tablet 3   insulin aspart (NOVOLOG) 100 UNIT/ML injection Inject 8-11 Units into the skin 3 (three) times daily with meals. 50 mL 2   insulin detemir (LEVEMIR) 100 UNIT/ML injection Inject 0.25 mLs (25 Units total) into the skin at bedtime. 60 mL 2   liraglutide (VICTOZA) 18 MG/3ML SOPN Inject 0.3 mLs (1.8 mg total) into the skin daily. (Patient taking differently: Inject 1.8 mg into the skin daily  with supper.) 36 mL 2   Misc Natural Products (ESTROVEN + ENERGY MAX STRENGTH) TABS Take 1 tablet by mouth daily.     omeprazole (PRILOSEC) 20 MG capsule TAKE 1 CAPSULE EVERY DAY (APPOINTMENT IS NEEDED WITH CARDIOLOGIST FOR REFILLS) (Patient taking differently: Take 20 mg by mouth at bedtime.) 90 capsule 3   potassium chloride SA (KLOR-CON) 20 MEQ tablet Take 1 tablet (20 mEq total) by mouth daily. 90 tablet 3   sertraline (ZOLOFT) 100 MG tablet TAKE 1 TABLET (100 MG TOTAL) BY MOUTH DAILY. 90 tablet 0   vitamin E 180 MG (400 UNITS) capsule Take 400 Units by mouth daily.     zinc gluconate 50 MG tablet Take 50 mg by mouth daily.     atorvastatin (LIPITOR) 20 MG tablet Take 20 mg by mouth daily.     doxylamine, Sleep, (UNISOM) 25 MG tablet Take 25 mg by mouth at bedtime.     lisinopril (ZESTRIL) 20 MG tablet Take 20 mg by mouth daily.     loperamide (IMODIUM A-D) 2 MG tablet Take 2 mg by mouth 4 (four) times daily as needed for diarrhea or loose stools.     naproxen sodium (ALEVE) 220 MG tablet Take 440 mg by mouth 2 (two) times daily as needed (pain).     torsemide (DEMADEX) 20 MG tablet Take 2 tablets (40 mg total) by mouth daily. 30 tablet 2   No current facility-administered medications for this encounter.   Facility-Administered Medications Ordered in Other Encounters  Medication Dose Route Frequency Provider Last Rate Last Admin   lactated ringers infusion   Intravenous Continuous PRN Kathryne Hitch, CRNA   New Bag at 12/18/18 1133   BP 140/78   Pulse 99   Wt 71 kg (156 lb 9.6 oz)   SpO2 99%   BMI 27.74 kg/m  Exam:   General: NAD Neck: JVP 9-10 cm, no thyromegaly or thyroid nodule.  Lungs: Crackles at bases.  CV: Nondisplaced PMI.  Heart irregular S1/S2,  no S3/S4, no murmur.  Trace edema.  No carotid bruit.  Normal pedal pulses.  Abdomen: Soft, nontender, no hepatosplenomegaly, no distention.  Skin: Intact without lesions or rashes.  Neurologic: Alert and oriented x 3.   Psych: Normal affect. Extremities: No clubbing or cyanosis.  HEENT: Normal.   Assessment/Plan:  1. RV failure/chronic diastolic CHF: ?RV failure due to lung disease +/- mitral stenosis.  Echo in 3/21 showed LV EF 65-70% with RV moderate-severely dilated and moderate dysfunction, D-shaped septum, the mitral valve is severely calcified though mean gradient was only about 5 mmHg suggesting mild to moderate mitral stenosis (pressure half-time does not appear short), mild AS.  Mitral stenosis appeared moderate by 3/21 cath but only mild-moderate by TEE and echo in 3/21.  During 3/21 admission, she required transient dobutamine for RV support but was titrated off.  Echo in 1/22 showed EF 60-65%, mild RV dilation with normal function, mild-moderate MS.  She has gone back into atrial fibrillation and tolerates it poorly.  She is volume overloaded on exam with NYHA class IIIb symptoms.   - Increase torsemide to 40 mg daily.  BMET today and in 10 days.   - She stopped Jardiance.  2. Pulmonary hypertension:  There was concern for group 3 PH but RHC/LHC in 3/21 suggested primarily pulmonary venous hypertension due to diastolic dysfunction and ?significant mitral stenosis (mild-moderate by TEE/TTE, moderate by RHC/LHC).  - I do not think that she would be a candidate for selective pulmonary vasodilators.  3. Mitral stenosis: Suspect rheumatic.  The valve looks severely calcified and restricted, but hemodynamics by echo in 3/21 were not that impressive.  Mean gradient 5 mmHg and MVA 2.66 cm^2 by PHT suggested no more than mild mitral stenosis.  However, cath in 3/21 was suggestive of moderate mitral stenosis with mean gradient 11.5 mmHg and MVA 1.67 cm^2 (cath was done with atrial flutter/RVR however). TEE was done 3/21 while in NSR and looked like mild-moderate MS with mean gradient 5 mmHg and MVA 2.2 cm^2.  Echo in 1/22 showed mild-moderate mitral stenosis with mean gradient 6 mmHg.  - She does not appear to be a  valvuloplasty candidate due to her degree of MV calcification.  Mitral stenosis not severe enough to warrant surgery at this point and she would be a difficult candidate for surgery.   4. Atrial flutter/fibrillation: TEE-guided DCCV 3/17.  Poorly tolerated.  She is back in atrial fibrillation currently.   - Continue amiodarone at 200 mg daily, do not think that she will stay out of AF without this.  She has had persistent mild transaminase elevation => RUQ [place] suggested cirrhosis.  Check LFTs and TSH today, she will need a regular eye exam.  - Continue apixaban.  - She saw Dr. Quentin Ore and was not thought to be an ablation candidate.  - She has not missed apixaban doses, I will arrange for DCCV as soon as possible.  We discussed risks/benefits and she agrees to procedure.  5. CKD stage 3: BMET today.  6. ILD: Patient has been followed by pulmonary for ILD noted on prior CT.  She has also had hilar/mediastinal adenopathy with some concern for possible sarcoidosis. She is using oxygen.  - She needs to followup with pulmonary => refer back to Dr. Chase Jordan.   7. HTN: BP controlled on amlodipine 5 mg daily.  8. COPD: Prior smoker.  Suspect there is a component of COPD in addition to ILD.  9. Cirrhosis: ?due to RV failure/cardiogenic or ?  due to amiodarone use. Viral hepatitis serologies were negative. I had cut back on amiodarone to 100 mg daily but she is now symptomatically in AF. I have increased amiodarone back to 200 mg daily.  - I will see if I can get her a GI appt in [place].   Followup post-DCCV  Signed, [name], MD  07/23/2020  [place] [address] [facility] [place] [address] [phone] (office) [phone] (fax)

## 2020-07-23 NOTE — Progress Notes (Signed)
ID:  [name], DOB 12-10-1945, MRN [id number]   Provider location: [facility] Type of Visit: Established patient   PCP:  [name], MD  Cardiologist:  Rozann Lesches, MD Primary HF: Dr. Aundra Dubin   History of Present Illness: Ellen Jordan is a 75 y.o. female who has a history of atrial flutter, diabetes, mitral stenosis, COPD/ILD, and chronic diastolic CHF with prominent RV failure.  She has been followed by Dr. Chase Caller in the ILD clinic and workup was ongoing.  She is a former smoker and is suspected to have a combination of COPD and ILD.  Of note, she has been found to have mediastinal and hilar adenopathy.    She was admitted in 3/21 with CHF/volume overload and atrial flutter.  Echo this admission showed LV EF 65-70% with RV moderate-severely dilated and moderate dysfunction, D-shaped septum, the mitral valve is severely calcified though mean gradient is only about 5 mmHg suggesting mild to moderate mitral stenosis (pressure half-time does not appear short), mild AS.  The patient was admitted markedly volume overloaded with RV failure.  She also had mitral stenosis which visually looked significant but measured only mild-moderate by mean gradient and pressure half-time on echo, this was confirmed by TEE (mild-moderate MS). She was initially oliguric with creatinine up to 4.27.  AKI resolved and she diuresed with the addition of dobutamine for RV support. RHC/LHC this admit showed mildly elevated PCWP with moderate primarily pulmonary venous hypertension. Mitral stenosis appeared moderate by cath (in setting of atrial flutter with RVR) but only mild-moderate by TEE and echo.  She had TEE-guided DCCV on 03/27/19.  She was eventually discharged home after full diuresis and weaning off dobutamine.   Echo in 1/22 showed EF 60-65%, mild RV dilation with normal RV systolic function, calcified mitral valve with mean gradient 6 mmHg (mild-moderate MS), PASP 55 mmHg  with normal IVC size.   She was noted to have elevated LFTs.  Viral hepatitis serologies were negative. RUQ [place] was concerning for cirrhosis.  Amiodarone was decreased to 100 mg daily.   She returns for followup of CHF.  She has felt worse for about 2 months.  She thinks she has been in atrial fibrillation over that time as her HR has been higher (in the 90s).  She is in atrial fibrillation today. She has not missed any Eliquis doses.  She is using home oxygen.  She is short of breath walking short distances just around the house.  No chest pain.  No lightheadedness.  She does note orthopnea and bendopnea.  Weight has not risen.    Labs (3/21): K 3.7, creatinine 1.74, hgb 11.1 Labs (11/21): LDL 62, HDL 25, TSH normal, hgb 13.1, K 3.2, creatinine 1.49 Labs (1/22): K 3.2, creatinine 1.75, BNP 120 Labs (2/22): K 4.4, creatinine 1.84, AST 99, ALT 74, viral hepatitis serologies negative   ECG (personally reviewed): Atrial fibrillation with RBBB  PMH: 1. Type 2 diabetes. 2. Atrial flutter: S/p DCCV 3/21.  3. CKD stage 3 4. Depression 5. HTN 6. H/o uterine cancer 7. Mitral stenosis: Suspect rheumatic.   - Echo (3/21): Mild mitral stenosis mean gradient 4.5.  - LHC/RHC (3/21): mitral valve mean gradient 11.5 mmHg, MVA 1.67 cm^2 => moderate MS but in atrial fibrillation with RVR.  - TEE (3/21): Mild-moderate MS with mean gradient 5 mmHg and MVA 2.2 cm^2 => in NSR.  - Echo (1/22) with mild-moderate MS (mean gradient 6 mmHg).  8. Chronic diastolic CHF with prominent RV failure: Echo (3/21) with EF 60-65%, D-shaped septum, moderate-severe RV enlargement with mildly decreased RV systolic function, PASP 41, rheumatic MV with mean gradient 4.5 (mild MS), mild AS.  - RHC/LHC (3/21): mean RA 9, PA 54/34 mean 34, mean PCWP 18, CI 2.59, PVR 3.27 WU.  - Echo (1/22): EF 60-65%, mild RV dilation with normal RV systolic function, calcified mitral valve with mean gradient 6 mmHg (mild-moderate MS), PASP 55 mmHg  with normal IVC size.  9. Pulmonary venous HTN: See 3/21 RHC above.  10. Chronic lung disease:  Suspected to have combination of COPD and ILD.  Of note, has had hilar/mediastinal adenopathy.  Follows with pulmonary.  11. Aortic stenosis: Mild on 3/21 echo.  12. Cirrhosis: Elevated LFTs noted, viral hepatitis serologies negative.  - RUQ US (2/22): Concerning for cirrhosis.   Social History   Socioeconomic History   Marital status: Married    Spouse name: earl   Number of children: 2   Years of education: 13   Highest education level: Not on file  Occupational History   Occupation: retired  Tobacco Use   Smoking status: Former    Packs/day: 1.00    Years: 50.00    Pack years: 50.00    Types: Cigarettes    Start date: 01/10/1961    Quit date: 01/10/2005    Years since quitting: 15.5   Smokeless tobacco: Former    Quit date: 01/10/2005  Vaping Use   Vaping Use: Never used  Substance and Sexual Activity   Alcohol use: No    Alcohol/week: 0.0 standard drinks   Drug use: No   Sexual activity: Not Currently    Partners: Male  Other Topics Concern   Not on file  Social History Narrative   Moved to this area in 1979 from [place].    Married for over 50 years. Has two children. Lives with husband Toby (earl)   Likes to read   Retired from School system.   Enjoys time with family, beach, read, exericse.   Walk dog.   Eats all food groups.   Wear seatbelt.   Wear sunscreen.   Social Determinants of Health   Financial Resource Strain: Not on file  Food Insecurity: Not on file  Transportation Needs: Not on file  Physical Activity: Not on file  Stress: Not on file  Social Connections: Not on file  Intimate Partner Violence: Not on file   Family History  Problem Relation Age of Onset   Heart disease Mother 68   Hyperlipidemia Mother    Diabetes Brother    COPD Father 67       emphysema   Hypertension Father    Hyperlipidemia Father    Diabetes Daughter    AAA  (abdominal aortic aneurysm) Son    Diabetes Brother    Diabetes Brother    Diabetes Brother    ROS: All systems reviewed and negative except as per HPI.   Current Outpatient Medications  Medication Sig Dispense Refill   Accu-Chek FastClix Lancets MISC TEST BLOOD GLUCOSE FOUR TIMES DAILY 408 each 2   ACCU-CHEK GUIDE test strip USE AS INSTRUCTED FOUR TIMES DAILY 400 strip 2   Alcohol Swabs (B-D SINGLE USE SWABS REGULAR) PADS USE FOUR TIMES DAILY 400 each 2   amiodarone (PACERONE) 200 MG tablet Take 1 tablet (200 mg total) by mouth daily. Needs f/u appt for further refills. Please call [phone] to schedule an appt. 30 tablet 2     amLODipine (NORVASC) 5 MG tablet Take 1 tablet (5 mg total) by mouth daily. 30 tablet 2   atenolol (TENORMIN) 25 MG tablet Take 25 mg by mouth daily.     atorvastatin (LIPITOR) 40 MG tablet TAKE 1 TABLET EVERY DAY (Patient not taking: No sig reported) 90 tablet 3   b complex vitamins tablet Take 1 tablet by mouth daily.     benzonatate (TESSALON) 200 MG capsule Take 200 mg by mouth daily.     Biotin 5000 MCG CAPS Take 5,000 mcg by mouth daily.     Blood Glucose Monitoring Suppl (ACCU-CHEK GUIDE) w/Device KIT 1 each by Does not apply route 4 (four) times daily. 1 kit 0   cholecalciferol (VITAMIN D) 1000 units tablet Take 1,000 Units by mouth daily.     diphenhydrAMINE (BENADRYL) 25 MG tablet Take 25 mg by mouth daily as needed for allergies.      ELIQUIS 5 MG TABS tablet TAKE 1 TABLET TWICE DAILY (Patient taking differently: Take 5 mg by mouth 2 (two) times daily.) 180 tablet 3   insulin aspart (NOVOLOG) 100 UNIT/ML injection Inject 8-11 Units into the skin 3 (three) times daily with meals. 50 mL 2   insulin detemir (LEVEMIR) 100 UNIT/ML injection Inject 0.25 mLs (25 Units total) into the skin at bedtime. 60 mL 2   liraglutide (VICTOZA) 18 MG/3ML SOPN Inject 0.3 mLs (1.8 mg total) into the skin daily. (Patient taking differently: Inject 1.8 mg into the skin daily  with supper.) 36 mL 2   Misc Natural Products (ESTROVEN + ENERGY MAX STRENGTH) TABS Take 1 tablet by mouth daily.     omeprazole (PRILOSEC) 20 MG capsule TAKE 1 CAPSULE EVERY DAY (APPOINTMENT IS NEEDED WITH CARDIOLOGIST FOR REFILLS) (Patient taking differently: Take 20 mg by mouth at bedtime.) 90 capsule 3   potassium chloride SA (KLOR-CON) 20 MEQ tablet Take 1 tablet (20 mEq total) by mouth daily. 90 tablet 3   sertraline (ZOLOFT) 100 MG tablet TAKE 1 TABLET (100 MG TOTAL) BY MOUTH DAILY. 90 tablet 0   vitamin E 180 MG (400 UNITS) capsule Take 400 Units by mouth daily.     zinc gluconate 50 MG tablet Take 50 mg by mouth daily.     atorvastatin (LIPITOR) 20 MG tablet Take 20 mg by mouth daily.     doxylamine, Sleep, (UNISOM) 25 MG tablet Take 25 mg by mouth at bedtime.     lisinopril (ZESTRIL) 20 MG tablet Take 20 mg by mouth daily.     loperamide (IMODIUM A-D) 2 MG tablet Take 2 mg by mouth 4 (four) times daily as needed for diarrhea or loose stools.     naproxen sodium (ALEVE) 220 MG tablet Take 440 mg by mouth 2 (two) times daily as needed (pain).     torsemide (DEMADEX) 20 MG tablet Take 2 tablets (40 mg total) by mouth daily. 30 tablet 2   No current facility-administered medications for this encounter.   Facility-Administered Medications Ordered in Other Encounters  Medication Dose Route Frequency Provider Last Rate Last Admin   lactated ringers infusion   Intravenous Continuous PRN Kathryne Hitch, CRNA   New Bag at 12/18/18 1133   BP 140/78   Pulse 99   Wt 71 kg (156 lb 9.6 oz)   SpO2 99%   BMI 27.74 kg/m  Exam:   General: NAD Neck: JVP 9-10 cm, no thyromegaly or thyroid nodule.  Lungs: Crackles at bases.  CV: Nondisplaced PMI.  Heart irregular S1/S2,  no S3/S4, no murmur.  Trace edema.  No carotid bruit.  Normal pedal pulses.  Abdomen: Soft, nontender, no hepatosplenomegaly, no distention.  Skin: Intact without lesions or rashes.  Neurologic: Alert and oriented x 3.   Psych: Normal affect. Extremities: No clubbing or cyanosis.  HEENT: Normal.   Assessment/Plan:  1. RV failure/chronic diastolic CHF: ?RV failure due to lung disease +/- mitral stenosis.  Echo in 3/21 showed LV EF 65-70% with RV moderate-severely dilated and moderate dysfunction, D-shaped septum, the mitral valve is severely calcified though mean gradient was only about 5 mmHg suggesting mild to moderate mitral stenosis (pressure half-time does not appear short), mild AS.  Mitral stenosis appeared moderate by 3/21 cath but only mild-moderate by TEE and echo in 3/21.  During 3/21 admission, she required transient dobutamine for RV support but was titrated off.  Echo in 1/22 showed EF 60-65%, mild RV dilation with normal function, mild-moderate MS.  She has gone back into atrial fibrillation and tolerates it poorly.  She is volume overloaded on exam with NYHA class IIIb symptoms.   - Increase torsemide to 40 mg daily.  BMET today and in 10 days.   - She stopped Jardiance.  2. Pulmonary hypertension:  There was concern for group 3 PH but RHC/LHC in 3/21 suggested primarily pulmonary venous hypertension due to diastolic dysfunction and ?significant mitral stenosis (mild-moderate by TEE/TTE, moderate by RHC/LHC).  - I do not think that she would be a candidate for selective pulmonary vasodilators.  3. Mitral stenosis: Suspect rheumatic.  The valve looks severely calcified and restricted, but hemodynamics by echo in 3/21 were not that impressive.  Mean gradient 5 mmHg and MVA 2.66 cm^2 by PHT suggested no more than mild mitral stenosis.  However, cath in 3/21 was suggestive of moderate mitral stenosis with mean gradient 11.5 mmHg and MVA 1.67 cm^2 (cath was done with atrial flutter/RVR however). TEE was done 3/21 while in NSR and looked like mild-moderate MS with mean gradient 5 mmHg and MVA 2.2 cm^2.  Echo in 1/22 showed mild-moderate mitral stenosis with mean gradient 6 mmHg.  - She does not appear to be a  valvuloplasty candidate due to her degree of MV calcification.  Mitral stenosis not severe enough to warrant surgery at this point and she would be a difficult candidate for surgery.   4. Atrial flutter/fibrillation: TEE-guided DCCV 3/17.  Poorly tolerated.  She is back in atrial fibrillation currently.   - Continue amiodarone at 200 mg daily, do not think that she will stay out of AF without this.  She has had persistent mild transaminase elevation => RUQ [place] suggested cirrhosis.  Check LFTs and TSH today, she will need a regular eye exam.  - Continue apixaban.  - She saw Dr. Quentin Ore and was not thought to be an ablation candidate.  - She has not missed apixaban doses, I will arrange for DCCV as soon as possible.  We discussed risks/benefits and she agrees to procedure.  5. CKD stage 3: BMET today.  6. ILD: Patient has been followed by pulmonary for ILD noted on prior CT.  She has also had hilar/mediastinal adenopathy with some concern for possible sarcoidosis. She is using oxygen.  - She needs to followup with pulmonary => refer back to Dr. Chase Caller.   7. HTN: BP controlled on amlodipine 5 mg daily.  8. COPD: Prior smoker.  Suspect there is a component of COPD in addition to ILD.  9. Cirrhosis: ?due to RV failure/cardiogenic or ?  due to amiodarone use. Viral hepatitis serologies were negative. I had cut back on amiodarone to 100 mg daily but she is now symptomatically in AF. I have increased amiodarone back to 200 mg daily.  - I will see if I can get her a GI appt in [address].   Followup post-DCCV  Signed, [name], MD  07/23/2020  [place] [address] [facility] [place] [address] [phone] (office) [phone] (fax)

## 2020-07-23 NOTE — Patient Instructions (Signed)
Increase torsemide to 40 mg daily  Keep Amiodarone  237m daily  Labs done today, your results will be available in MyChart, we will contact you for abnormal readings.  Your physician recommends that you schedule a follow-up appointment in:  10 days for lab work [facility] to Dr RDillard [place],they will call you for an apoointment  Your physician recommends that you schedule a follow-up appointment in: GI Dr. , they will call to schedule an appointment  Your physician has recommended that you have a Cardioversion (DCCV). Electrical Cardioversion uses a jolt of electricity to your heart either through paddles or wired patches attached to your chest. This is a controlled, usually prescheduled, procedure. Defibrillation is done under light anesthesia in the hospital, and you usually go home the day of the procedure. This is done to get your heart back into a normal rhythm. You are not awake for the procedure. Please see the instruction sheet given to you today.   You are scheduled for a TEE/Cardioversion/TEE Cardioversion on July 18 ,2022 _0 :30 with Dr. .  [facility] [address] [place] [id number] 0930 am/pm. (1 hour prior to procedure unless lab work is needed; if lab work is needed arrive 1.5 hours ahead)  DIET: Nothing to eat or drink after midnight except a sip of water with medications (see medication instructions below)  FYI: For your safety, and to allow [place] monitor your vital signs accurately during the surgery/procedure we request that   if you have artificial nails, gel coating, SNS etc. Please have those removed prior to your surgery/procedure. Not having the nail coverings /polish removed may result in cancellation or delay of your surgery/procedure.   Medication Instructions: Hold Take 1/2 insulin dose night before Hold torsemide  Continue your anticoagulant: Eliquis You will  need to continue your anticoagulant after your procedure until you  are told by your  Provider that it is safe to stop   Labs: If patient is on Coumadin, patient needs pt/INR, CBC, BMET within 3 days (No pt/INR needed for patients taking Xarelto, Eliquis, Pradaxa) For patients receiving anesthesia for TEE and all Cardioversion patients: BMET, CBC within 1 week   You must have a responsible person to drive you home and stay in the waiting area during your procedure. Failure to do so could result in cancellation.  Bring your insurance cards.  *Special Note: Every effort is made to have your procedure done on time. Occasionally there are emergencies that occur at the hospital that may cause delays. Please be patient if a delay does occur.

## 2020-07-27 ENCOUNTER — Other Ambulatory Visit: Payer: Self-pay

## 2020-07-27 ENCOUNTER — Ambulatory Visit (HOSPITAL_COMMUNITY): Payer: Medicare Other | Admitting: Certified Registered"

## 2020-07-27 ENCOUNTER — Encounter (HOSPITAL_COMMUNITY)
Admission: RE | Disposition: A | Discharge status: Home / Self Care | Payer: Self-pay | Source: Home / Self Care | Attending: Cardiology

## 2020-07-27 ENCOUNTER — Encounter (HOSPITAL_COMMUNITY): Payer: Self-pay | Admitting: Cardiology

## 2020-07-27 ENCOUNTER — Ambulatory Visit (HOSPITAL_COMMUNITY)
Admission: RE | Admit: 2020-07-27 | Discharge: 2020-07-27 | Disposition: A | Discharge status: Home / Self Care | Payer: Medicare Other | Attending: Cardiology | Admitting: Cardiology

## 2020-07-27 DIAGNOSIS — J849 Interstitial pulmonary disease, unspecified: Secondary | ICD-10-CM | POA: Diagnosis not present

## 2020-07-27 DIAGNOSIS — Z79899 Other long term (current) drug therapy: Secondary | ICD-10-CM | POA: Diagnosis not present

## 2020-07-27 DIAGNOSIS — I130 Hypertensive heart and chronic kidney disease with heart failure and stage 1 through stage 4 chronic kidney disease, or unspecified chronic kidney disease: Secondary | ICD-10-CM | POA: Insufficient documentation

## 2020-07-27 DIAGNOSIS — Z7901 Long term (current) use of anticoagulants: Secondary | ICD-10-CM | POA: Diagnosis not present

## 2020-07-27 DIAGNOSIS — I4892 Unspecified atrial flutter: Secondary | ICD-10-CM | POA: Insufficient documentation

## 2020-07-27 DIAGNOSIS — I050 Rheumatic mitral stenosis: Secondary | ICD-10-CM | POA: Insufficient documentation

## 2020-07-27 DIAGNOSIS — I480 Paroxysmal atrial fibrillation: Secondary | ICD-10-CM | POA: Diagnosis not present

## 2020-07-27 DIAGNOSIS — K7460 Unspecified cirrhosis of liver: Secondary | ICD-10-CM | POA: Diagnosis not present

## 2020-07-27 DIAGNOSIS — Z87891 Personal history of nicotine dependence: Secondary | ICD-10-CM | POA: Diagnosis not present

## 2020-07-27 DIAGNOSIS — E782 Mixed hyperlipidemia: Secondary | ICD-10-CM | POA: Diagnosis not present

## 2020-07-27 DIAGNOSIS — R590 Localized enlarged lymph nodes: Secondary | ICD-10-CM | POA: Insufficient documentation

## 2020-07-27 DIAGNOSIS — I4891 Unspecified atrial fibrillation: Secondary | ICD-10-CM | POA: Insufficient documentation

## 2020-07-27 DIAGNOSIS — Z8542 Personal history of malignant neoplasm of other parts of uterus: Secondary | ICD-10-CM | POA: Diagnosis not present

## 2020-07-27 DIAGNOSIS — I272 Pulmonary hypertension, unspecified: Secondary | ICD-10-CM | POA: Insufficient documentation

## 2020-07-27 DIAGNOSIS — I5032 Chronic diastolic (congestive) heart failure: Secondary | ICD-10-CM | POA: Insufficient documentation

## 2020-07-27 DIAGNOSIS — E1122 Type 2 diabetes mellitus with diabetic chronic kidney disease: Secondary | ICD-10-CM | POA: Diagnosis not present

## 2020-07-27 DIAGNOSIS — N183 Chronic kidney disease, stage 3 unspecified: Secondary | ICD-10-CM | POA: Insufficient documentation

## 2020-07-27 DIAGNOSIS — E876 Hypokalemia: Secondary | ICD-10-CM | POA: Diagnosis not present

## 2020-07-27 DIAGNOSIS — Z794 Long term (current) use of insulin: Secondary | ICD-10-CM | POA: Insufficient documentation

## 2020-07-27 HISTORY — PX: CARDIOVERSION: SHX1299

## 2020-07-27 LAB — GLUCOSE, CAPILLARY: Glucose-Capillary: 124 mg/dL — ABNORMAL HIGH (ref 70–99)

## 2020-07-27 SURGERY — CARDIOVERSION
Anesthesia: General

## 2020-07-27 MED ORDER — LIDOCAINE 2% (20 MG/ML) 5 ML SYRINGE
INTRAMUSCULAR | Status: DC | PRN
Start: 2020-07-27 — End: 2020-07-27
  Administered 2020-07-27: 60 mg via INTRAVENOUS

## 2020-07-27 MED ORDER — PHENYLEPHRINE 40 MCG/ML (10ML) SYRINGE FOR IV PUSH (FOR BLOOD PRESSURE SUPPORT)
PREFILLED_SYRINGE | INTRAVENOUS | Status: DC | PRN
  Administered 2020-07-27: 120 ug via INTRAVENOUS

## 2020-07-27 MED ORDER — PROPOFOL 10 MG/ML IV BOLUS
INTRAVENOUS | Status: DC | PRN
  Administered 2020-07-27: 50 mg via INTRAVENOUS

## 2020-07-27 MED ORDER — SODIUM CHLORIDE 0.9 % IV SOLN
INTRAVENOUS | Status: AC | PRN
  Administered 2020-07-27: 500 mL via INTRAVENOUS

## 2020-07-27 NOTE — Interval H&P Note (Signed)
History and Physical Interval Note:  07/27/2020 10:38 AM  Ellen Jordan  has presented today for surgery, with the diagnosis of Afib/Aflutter.  The various methods of treatment have been discussed with the patient and family. After consideration of risks, benefits and other options for treatment, the patient has consented to  Procedure(s): CARDIOVERSION (N/A) as a surgical intervention.  The patient's history has been reviewed, patient examined, no change in status, stable for surgery.  I have reviewed the patient's chart and labs.  Questions were answered to the patient's satisfaction.     Jerica Creegan [employer]

## 2020-07-27 NOTE — Transfer of Care (Signed)
Immediate Anesthesia Transfer of Care Note  Patient: Ellen Jordan  Procedure(s) Performed: CARDIOVERSION  Patient Location: Endoscopy Unit  Anesthesia Type:General  Level of Consciousness: drowsy  Airway & Oxygen Therapy: Patient Spontanous Breathing and Patient connected to nasal cannula oxygen  Post-op Assessment: Report given to RN  Post vital signs: Reviewed and stable  Last Vitals:  Vitals Value Taken Time  BP 81/37   Temp    Pulse 76 07/27/20 1046  Resp 24 07/27/20 1046  SpO2 100 % 07/27/20 1046  Vitals shown include unvalidated device data.  Last Pain:  Vitals:   07/27/20 0942  TempSrc: Temporal  PainSc: 0-No pain         Complications: No notable events documented.

## 2020-07-27 NOTE — Anesthesia Preprocedure Evaluation (Addendum)
Anesthesia Evaluation  Patient identified by MRN, date of birth, ID band Patient awake    Reviewed: Allergy & Precautions, H&P , NPO status , Patient's Chart, lab work & pertinent test results, reviewed documented beta blocker date and time   History of Anesthesia Complications (+) PONV  Airway Mallampati: II  TM Distance: >3 FB Neck ROM: Full    Dental no notable dental hx. (+) Teeth Intact, Dental Advisory Given   Pulmonary neg pulmonary ROS, former smoker,    Pulmonary exam normal breath sounds clear to auscultation       Cardiovascular hypertension, Pt. on medications and Pt. on home beta blockers + dysrhythmias Atrial Fibrillation  Rhythm:Irregular Rate:Normal     Neuro/Psych Anxiety Depression negative neurological ROS     GI/Hepatic Neg liver ROS, GERD  ,  Endo/Other  diabetes, Insulin Dependent  Renal/GU Renal disease  negative genitourinary   Musculoskeletal   Abdominal   Peds  Hematology  (+) Blood dyscrasia, anemia ,   Anesthesia Other Findings   Reproductive/Obstetrics negative OB ROS                            Anesthesia Physical Anesthesia Plan  ASA: 3  Anesthesia Plan: General   Post-op Pain Management:    Induction: Intravenous  PONV Risk Score and Plan: 4 or greater and Propofol infusion and Treatment may vary due to age or medical condition  Airway Management Planned: Mask  Additional Equipment:   Intra-op Plan:   Post-operative Plan:   Informed Consent: I have reviewed the patients History and Physical, chart, labs and discussed the procedure including the risks, benefits and alternatives for the proposed anesthesia with the patient or authorized representative who has indicated his/her understanding and acceptance.     Dental advisory given  Plan Discussed with: CRNA  Anesthesia Plan Comments:         Anesthesia Quick Evaluation

## 2020-07-27 NOTE — Anesthesia Postprocedure Evaluation (Signed)
Anesthesia Post Note  Patient: Ellen Jordan  Procedure(s) Performed: CARDIOVERSION     Patient location during evaluation: Endoscopy Anesthesia Type: General Level of consciousness: awake and alert Pain management: pain level controlled Vital Signs Assessment: post-procedure vital signs reviewed and stable Respiratory status: spontaneous breathing, nonlabored ventilation and respiratory function stable Cardiovascular status: blood pressure returned to baseline and stable Postop Assessment: no apparent nausea or vomiting Anesthetic complications: no   No notable events documented.  Last Vitals:  Vitals:   07/27/20 1105 07/27/20 1115  BP: (!) 99/43 (!) 100/46  Pulse: 76 77  Resp: (!) 25 (!) 22  Temp:    SpO2: 97% 95%    Last Pain:  Vitals:   07/27/20 1115  TempSrc:   PainSc: 0-No pain                 Mistina Coatney,W. EDMOND

## 2020-07-27 NOTE — Procedures (Signed)
Electrical Cardioversion Procedure Note SEHER SCHLAGEL [id number] [birth year]  Procedure: Electrical Cardioversion Indications:  Atrial Fibrillation  Procedure Details Consent: Risks of procedure as well as the alternatives and risks of each were explained to the (patient/caregiver).  Consent for procedure obtained. Time Out: Verified patient identification, verified procedure, site/side was marked, verified correct patient position, special equipment/implants available, medications/allergies/relevent history reviewed, required imaging and test results available.  Performed  Patient placed on cardiac monitor, pulse oximetry, supplemental oxygen as necessary.  Sedation given:  Per anesthesiology Pacer pads placed anterior and posterior chest.  Cardioverted 2 times, the 2nd time with sternal pressure.  Cardioverted at [place].  Evaluation Findings: Post procedure EKG shows: NSR Complications: None Patient did tolerate procedure well.   [name] 07/27/2020, 10:45 AM

## 2020-07-28 ENCOUNTER — Encounter (HOSPITAL_COMMUNITY): Payer: Self-pay | Admitting: Cardiology

## 2020-07-29 ENCOUNTER — Emergency Department (HOSPITAL_COMMUNITY)
Admission: EM | Admit: 2020-07-29 | Discharge: 2020-07-29 | Disposition: A | Discharge status: Home / Self Care | Payer: Medicare Other | Attending: Emergency Medicine | Admitting: Emergency Medicine

## 2020-07-29 ENCOUNTER — Encounter (HOSPITAL_COMMUNITY): Payer: Self-pay | Admitting: Emergency Medicine

## 2020-07-29 ENCOUNTER — Other Ambulatory Visit: Payer: Self-pay

## 2020-07-29 ENCOUNTER — Telehealth (HOSPITAL_COMMUNITY): Payer: Self-pay | Admitting: *Deleted

## 2020-07-29 ENCOUNTER — Emergency Department (HOSPITAL_COMMUNITY): Payer: Medicare Other

## 2020-07-29 DIAGNOSIS — R0602 Shortness of breath: Secondary | ICD-10-CM | POA: Diagnosis not present

## 2020-07-29 DIAGNOSIS — R531 Weakness: Secondary | ICD-10-CM | POA: Diagnosis not present

## 2020-07-29 DIAGNOSIS — R110 Nausea: Secondary | ICD-10-CM | POA: Insufficient documentation

## 2020-07-29 DIAGNOSIS — Z5321 Procedure and treatment not carried out due to patient leaving prior to being seen by health care provider: Secondary | ICD-10-CM | POA: Insufficient documentation

## 2020-07-29 LAB — CBC
HCT: 34.6 % — ABNORMAL LOW (ref 36.0–46.0)
Hemoglobin: 10.8 g/dL — ABNORMAL LOW (ref 12.0–15.0)
MCH: 30.3 pg (ref 26.0–34.0)
MCHC: 31.2 g/dL (ref 30.0–36.0)
MCV: 97.2 fL (ref 80.0–100.0)
Platelets: 212 10*3/uL (ref 150–400)
RBC: 3.56 MIL/uL — ABNORMAL LOW (ref 3.87–5.11)
RDW: 14.1 % (ref 11.5–15.5)
WBC: 9.1 10*3/uL (ref 4.0–10.5)
nRBC: 0 % (ref 0.0–0.2)

## 2020-07-29 LAB — BASIC METABOLIC PANEL
Anion gap: 12 (ref 5–15)
BUN: 40 mg/dL — ABNORMAL HIGH (ref 8–23)
CO2: 21 mmol/L — ABNORMAL LOW (ref 22–32)
Calcium: 8.8 mg/dL — ABNORMAL LOW (ref 8.9–10.3)
Chloride: 99 mmol/L (ref 98–111)
Creatinine, Ser: 2.17 mg/dL — ABNORMAL HIGH (ref 0.44–1.00)
GFR, Estimated: 23 mL/min — ABNORMAL LOW (ref >60)
Glucose, Bld: 112 mg/dL — ABNORMAL HIGH (ref 70–99)
Potassium: 3.9 mmol/L (ref 3.5–5.1)
Sodium: 132 mmol/L — ABNORMAL LOW (ref 135–145)

## 2020-07-29 LAB — TROPONIN I (HIGH SENSITIVITY): Troponin I (High Sensitivity): 10 ng/L (ref <18)

## 2020-07-29 NOTE — ED Notes (Signed)
No answer for vitals x2

## 2020-07-29 NOTE — ED Notes (Signed)
No answer for vitals recheck x1

## 2020-07-29 NOTE — Telephone Encounter (Signed)
Patients husband called stating patient is much worse than she was before the cardioversion. Pt is very tired, no energy, more short of breath, and has diarrhea. Husband said pt just sleeps all the time and barely moves. Per Dr.McLean send the patient to the emergency room will likely need to be admitted. Husband aware and agreeable with plan.

## 2020-07-29 NOTE — Progress Notes (Signed)
Spoke with husband , stated that patient was very tired, short of breath, and has diarrhea.  They are on their way to the hospital

## 2020-07-29 NOTE — ED Provider Notes (Signed)
Emergency Medicine Provider Triage Evaluation Note  Ellen Jordan , a 75 y.o. female  was evaluated in triage.  Pt complains of feeling poorly since cardioversion 4 days ago.  Generalized weakness, shortness of breath, nausea.  Review of Systems  Positive: Weakness, shortness of breath, nausea Negative: Chest pain, syncope, vomiting  Physical Exam  BP (!) 119/59 (BP Location: Left Arm)   Pulse 66   Temp 98.1 F (36.7 C) (Oral)   Resp 18   SpO2 100%   Gen:   Awake, no distress   Resp:  Normal effort  MSK:   Moves extremities without difficulty  Other:    Medical Decision Making  Medically screening exam initiated at 4:50 PM.  Appropriate orders placed.  Ellen Jordan was informed that the remainder of the evaluation will be completed by another provider, this initial triage assessment does not replace that evaluation, and the importance of remaining in the ED until their evaluation is complete.     Lorayne Bender, PA-C 07/29/20 1703    Lajean Saver, MD 08/03/20 [phone]

## 2020-07-29 NOTE — ED Triage Notes (Signed)
Patient coming from home, complaint of general weakness, shortness of breath. Patient states she may be in afib, has cardioversion on Monday of this week for same.

## 2020-08-06 ENCOUNTER — Ambulatory Visit: Payer: Medicare HMO | Admitting: Nurse Practitioner

## 2020-08-07 ENCOUNTER — Ambulatory Visit: Payer: Medicare HMO | Admitting: Nurse Practitioner

## 2020-08-07 DIAGNOSIS — E782 Mixed hyperlipidemia: Secondary | ICD-10-CM

## 2020-08-07 DIAGNOSIS — I10 Essential (primary) hypertension: Secondary | ICD-10-CM

## 2020-08-07 DIAGNOSIS — E1165 Type 2 diabetes mellitus with hyperglycemia: Secondary | ICD-10-CM

## 2020-08-10 ENCOUNTER — Telehealth: Payer: Self-pay | Admitting: Nurse Practitioner

## 2020-08-10 NOTE — Telephone Encounter (Signed)
Pt husband called because she missed her appt last week and he states that she can not make any future appts because she can not get out the house at this time.

## 2020-08-12 ENCOUNTER — Encounter (HOSPITAL_COMMUNITY): Payer: Medicare HMO

## 2020-08-13 ENCOUNTER — Ambulatory Visit: Payer: Medicare HMO | Admitting: Primary Care

## 2020-08-25 ENCOUNTER — Telehealth: Payer: Self-pay | Admitting: Nurse Practitioner

## 2020-08-25 NOTE — Telephone Encounter (Signed)
Received patient medication from [employer] patient assistance program, contacted them and informed them patient has passed away. Spoke to [place] and she advised me to dispose of the medication.

## 2020-09-10 DEATH — deceased

## 2020-11-09 ENCOUNTER — Ambulatory Visit: Payer: Medicare HMO | Admitting: Gastroenterology

## 2020-12-18 ENCOUNTER — Telehealth: Payer: Self-pay | Admitting: "Endocrinology

## 2020-12-18 NOTE — Telephone Encounter (Signed)
Received patients insulin from [employer], had to open chart to get patient DOD. Calling to [employer] of this shipment.

## 2021-04-05 IMAGING — CT CT CHEST W/O CM
2 of 4 series · 14 of 36 positions shown, 17 images · non-contrast
Comparison: None.

CLINICAL DATA: Follow-up pulmonary nodules.

EXAM:
CT CHEST WITHOUT CONTRAST
TECHNIQUE: Multidetector CT imaging of the chest was performed following the
standard protocol without IV contrast.

[Series 2: routine chest without · axial · non-contrast · 0.65mm/px · z∈[+1304,+1580]mm · 11 of 164 slices shown, 14 images]
[im 13/164  mediastinal]
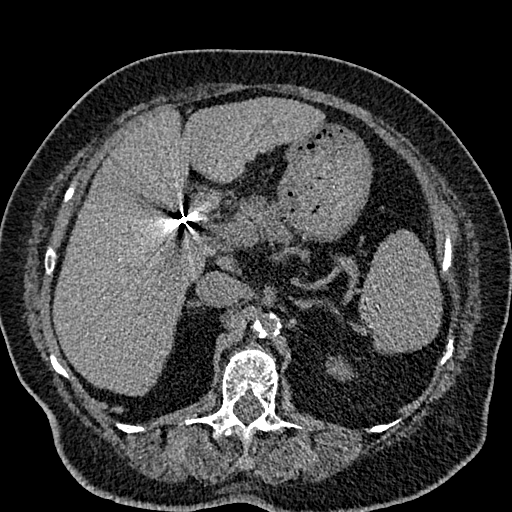
[im 13/164  lung]
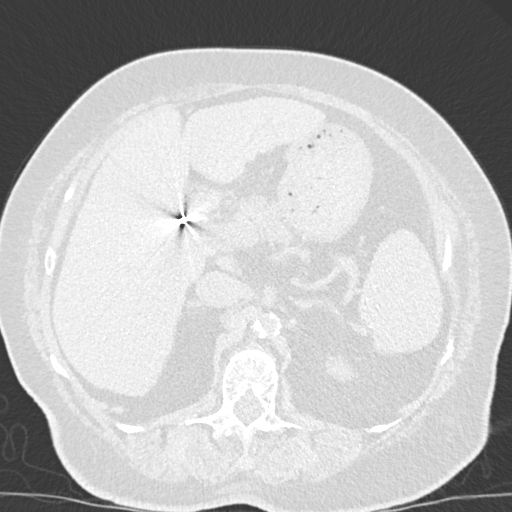
[im 26/164  lung]
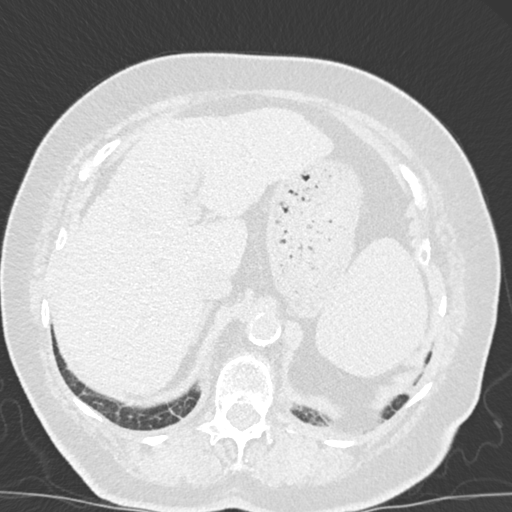
[im 38/164  lung]
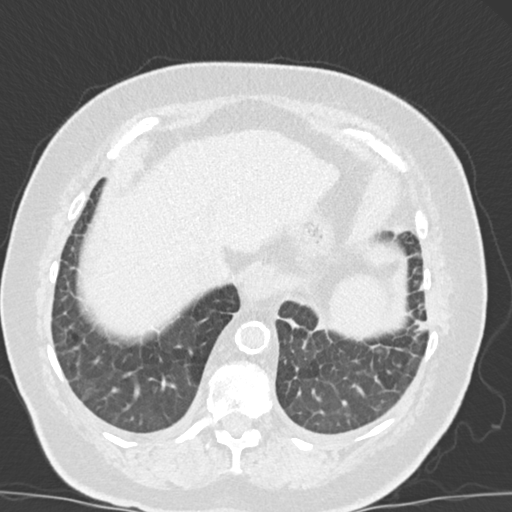
[im 51/164  lung]
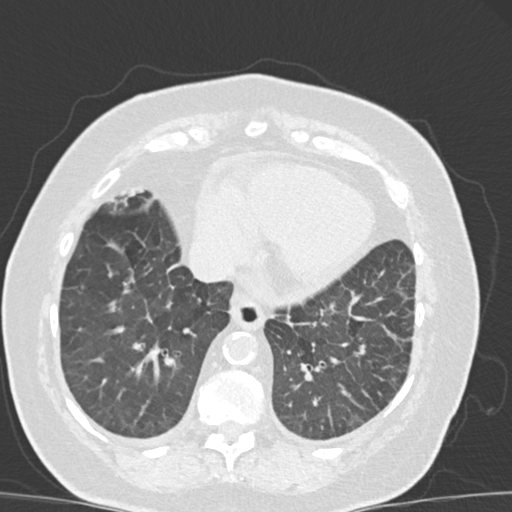
[im 63/164  mediastinal]
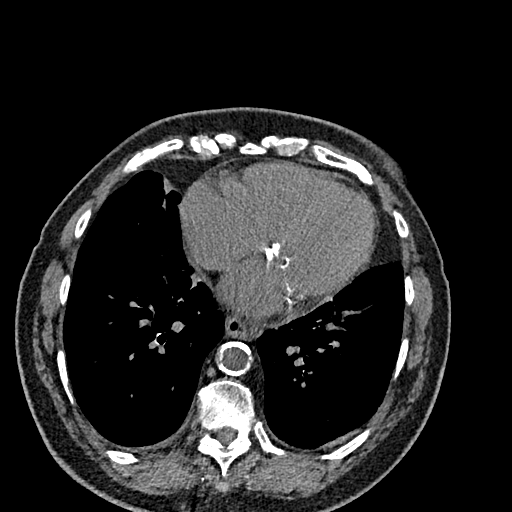
[im 63/164  lung]
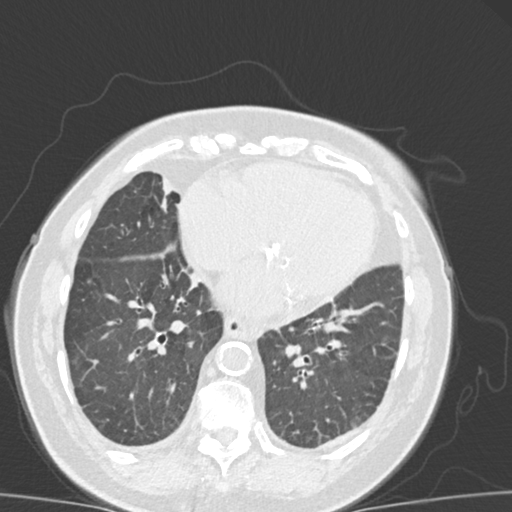
[im 88/164  lung]
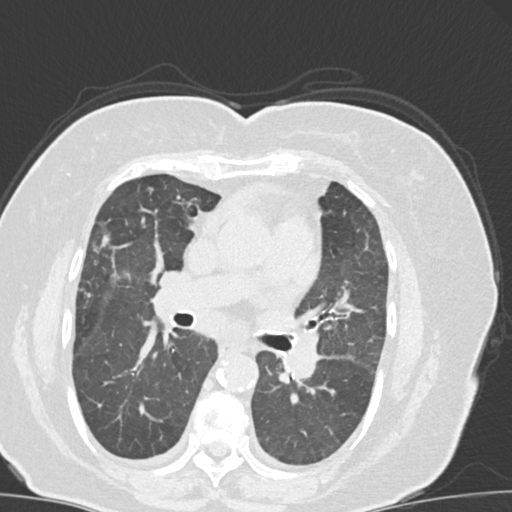
[im 101/164  lung]
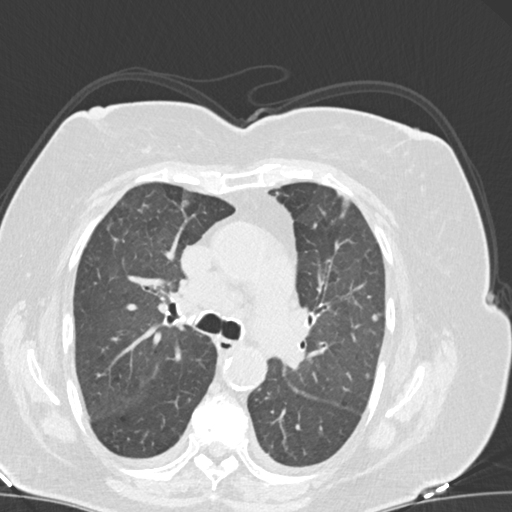
[im 113/164  lung]
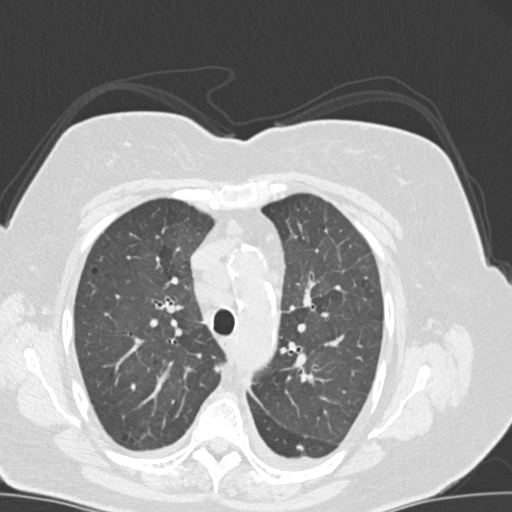
[im 126/164  mediastinal]
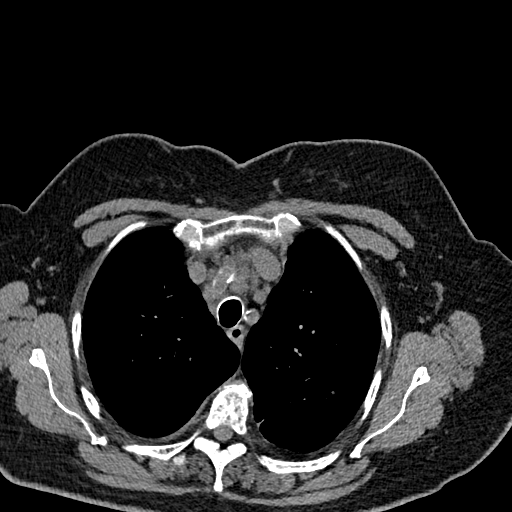
[im 126/164  lung]
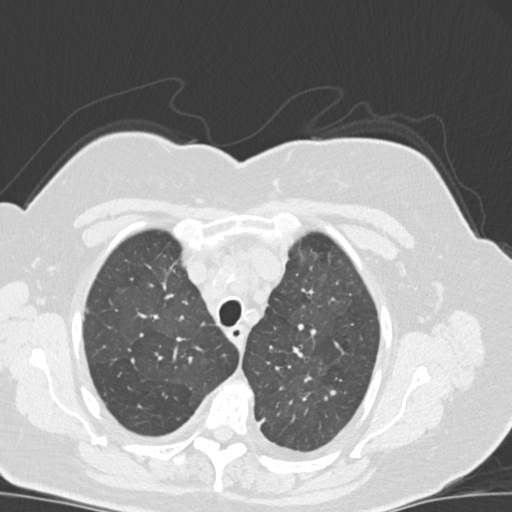
[im 138/164  lung]
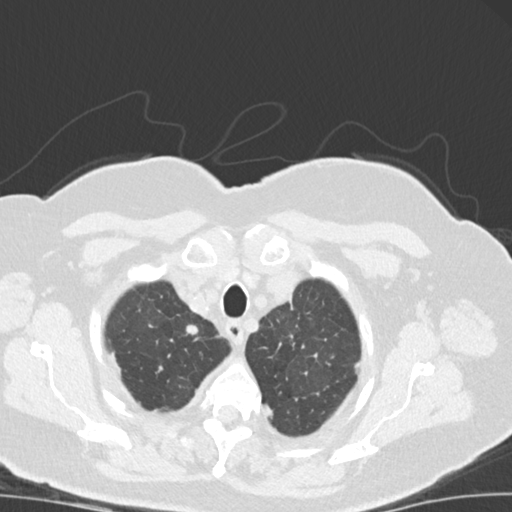
[im 151/164  lung]
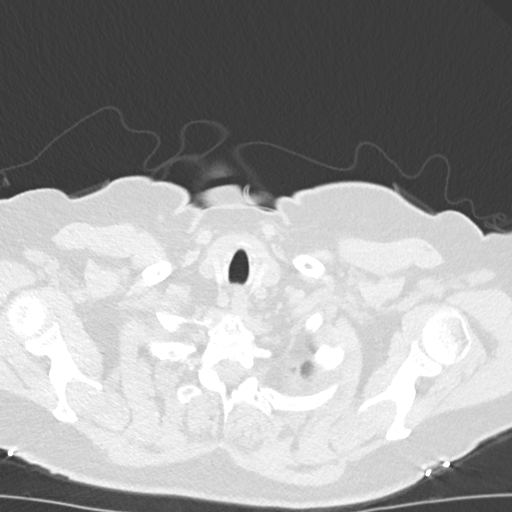

[Series 6: coronal · coronal · 0.60mm/px · 3 of 149 slices shown]
[im 30/149  lung]
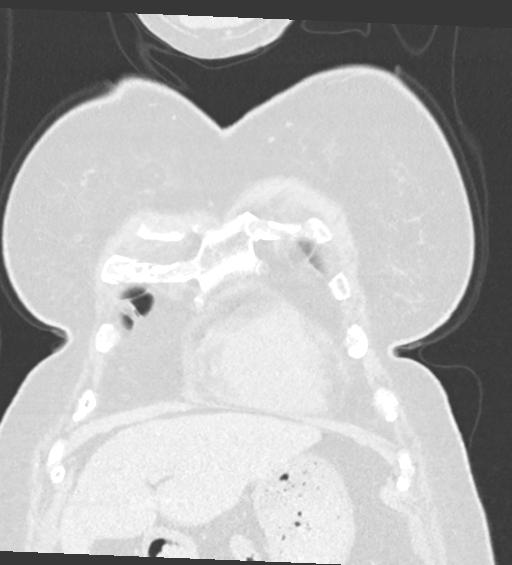
[im 60/149  lung]
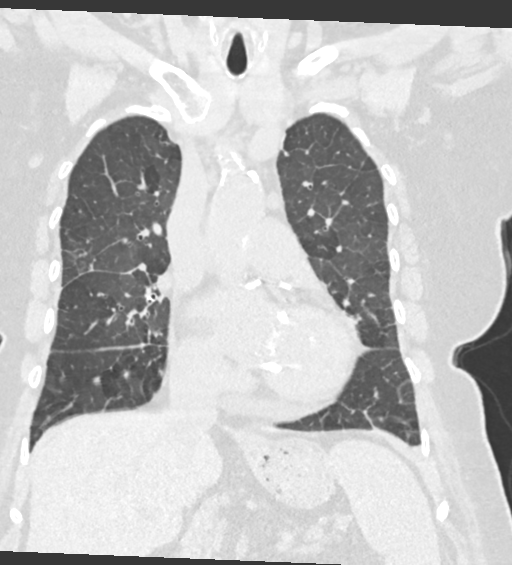
[im 89/149  lung]
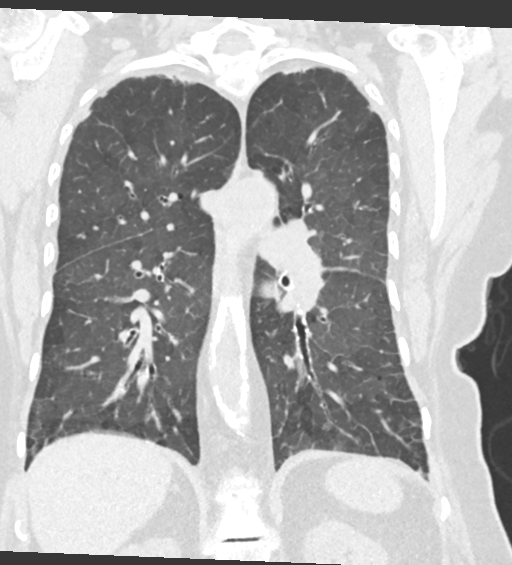

[14 of 36 positions shown; findings below may reference images not displayed]

FINDINGS: Cardiovascular: Calcific atherosclerosis throughout the thoracic
aorta. No signs of aneurysmal dilation. Heart size enlarged with
dense mitral annular calcifications similar to prior study. Small
pericardial effusion is unchanged.

Mediastinum/Nodes: Bulky mediastinal nodal enlargement largest along
the right paratracheal chain measuring 2.5 cm, (image 62, series 2)
previously 2 cm.

No signs of supraclavicular or axillary lymphadenopathy.

Enlargement of subcarinal lymph nodes at 1.8 cm (image 76, series 2)
other lymph nodes in the chest with similar enlargement as compared
to the previous exam. In short axis, previously 1.1 cm.

Lungs/Pleura: Pulmonary nodules, septal thickening and mosaic
attenuation with trace pleural fluid. Septal thickening has an
irregular pattern at the lung bases and fissural thickening is
present with subtle nodularity.

(Image 28, series 4) stable 8 mm right upper lobe pulmonary nodule.

(Image 57, series 4) stable 4 mm right upper lobe pulmonary nodule.

Other small pulmonary nodules are stable. Increased septal
thickening in the lung bases.

New areas of ill-defined nodularity, some with surrounding
ground-glass in the right middle lobe and with tree-in-bud
nodularity also in the right middle lobe.

Upper Abdomen: Signs of cirrhosis, post cholecystectomy. No acute
findings in the upper abdomen.

Musculoskeletal: No signs of acute bone finding or evidence of
destructive bone process.
IMPRESSION: 1. Bulky mediastinal adenopathy as described. Lymphoproliferative
disorder or metastatic process and lymphangitic carcinomatosis is
not excluded. Sarcoidosis is considered in the differential based on
the presence of adenopathy, potential interstitial lung disease with
signs of nodular septal thickening and fissural thickening. Nodal
biopsy may be warranted.
2. New areas of its tree-in-bud opacity and ill-defined nodularity
in the inferior right upper lobe and in the right middle lobe.
Findings raising the question atypical infection.
3. Multiple pulmonary nodules are not changed. Continued follow-up
based on the largest nodule in the right upper lobe is suggested at
18 months following the scan from Wednesday July, 2018; however, follow-up
may occur sooner based on other findings described above it has.
4. Signs of cirrhosis.
5. Aortic atherosclerosis. Coronary artery calcifications. An
element of the septal thickening could be due to pulmonary edema as
well, though areas appear more irregular and slightly nodular as
described.
6. Small pericardial effusion is unchanged, now associated with
trace bilateral pleural effusions.
7. These results will be called to the ordering clinician or
representative by the Radiologist Assistant, and communication
documented in the PACS or zVision Dashboard.

Aortic Atherosclerosis ([id number]).
